# Patient Record
Sex: Female | Born: 1960 | Race: White | Hispanic: No | Marital: Married | Smoking: Never smoker
Health system: Southern US, Community
[De-identification: ages and names within clinical notes are randomized; demographics above are authoritative.]

## PROBLEM LIST (undated history)

## (undated) DIAGNOSIS — M797 Fibromyalgia: Secondary | ICD-10-CM

## (undated) DIAGNOSIS — R51 Headache: Secondary | ICD-10-CM

## (undated) DIAGNOSIS — G939 Disorder of brain, unspecified: Secondary | ICD-10-CM

## (undated) DIAGNOSIS — Z8489 Family history of other specified conditions: Secondary | ICD-10-CM

## (undated) DIAGNOSIS — E039 Hypothyroidism, unspecified: Secondary | ICD-10-CM

## (undated) DIAGNOSIS — E079 Disorder of thyroid, unspecified: Secondary | ICD-10-CM

## (undated) DIAGNOSIS — F32A Depression, unspecified: Secondary | ICD-10-CM

## (undated) DIAGNOSIS — G43909 Migraine, unspecified, not intractable, without status migrainosus: Secondary | ICD-10-CM

## (undated) DIAGNOSIS — M3500 Sicca syndrome, unspecified: Secondary | ICD-10-CM

## (undated) DIAGNOSIS — I7300 Raynaud's syndrome without gangrene: Secondary | ICD-10-CM

## (undated) DIAGNOSIS — M1990 Unspecified osteoarthritis, unspecified site: Secondary | ICD-10-CM

## (undated) DIAGNOSIS — I959 Hypotension, unspecified: Secondary | ICD-10-CM

## (undated) DIAGNOSIS — K5792 Diverticulitis of intestine, part unspecified, without perforation or abscess without bleeding: Secondary | ICD-10-CM

## (undated) DIAGNOSIS — R519 Headache, unspecified: Secondary | ICD-10-CM

## (undated) DIAGNOSIS — F419 Anxiety disorder, unspecified: Secondary | ICD-10-CM

## (undated) DIAGNOSIS — K219 Gastro-esophageal reflux disease without esophagitis: Secondary | ICD-10-CM

## (undated) DIAGNOSIS — R413 Other amnesia: Secondary | ICD-10-CM

## (undated) DIAGNOSIS — N189 Chronic kidney disease, unspecified: Secondary | ICD-10-CM

## (undated) DIAGNOSIS — I729 Aneurysm of unspecified site: Secondary | ICD-10-CM

## (undated) DIAGNOSIS — F329 Major depressive disorder, single episode, unspecified: Secondary | ICD-10-CM

## (undated) DIAGNOSIS — N200 Calculus of kidney: Secondary | ICD-10-CM

## (undated) HISTORY — DX: Hypothyroidism, unspecified: E03.9

## (undated) HISTORY — PX: BREAST BIOPSY: SHX20

## (undated) HISTORY — DX: Gastro-esophageal reflux disease without esophagitis: K21.9

## (undated) HISTORY — PX: EYE SURGERY: SHX253

## (undated) HISTORY — DX: Calculus of kidney: N20.0

## (undated) HISTORY — DX: Other amnesia: R41.3

## (undated) HISTORY — DX: Fibromyalgia: M79.7

## (undated) HISTORY — PX: KNEE SURGERY: SHX244

## (undated) HISTORY — DX: Hypotension, unspecified: I95.9

## (undated) HISTORY — DX: Disorder of brain, unspecified: G93.9

## (undated) HISTORY — DX: Chronic kidney disease, unspecified: N18.9

## (undated) HISTORY — DX: Headache: R51

## (undated) HISTORY — DX: Unspecified osteoarthritis, unspecified site: M19.90

## (undated) HISTORY — DX: Disorder of thyroid, unspecified: E07.9

## (undated) HISTORY — PX: BASAL CELL CARCINOMA EXCISION: SHX1214

## (undated) HISTORY — DX: Headache, unspecified: R51.9

## (undated) HISTORY — DX: Sjogren syndrome, unspecified: M35.00

## (undated) HISTORY — DX: Migraine, unspecified, not intractable, without status migrainosus: G43.909

## (undated) HISTORY — DX: Diverticulitis of intestine, part unspecified, without perforation or abscess without bleeding: K57.92

## (undated) HISTORY — PX: LITHOTRIPSY: SUR834

## (undated) HISTORY — PX: ABDOMINAL HYSTERECTOMY: SHX81

## (undated) HISTORY — PX: OOPHORECTOMY: SHX86

## (undated) HISTORY — DX: Raynaud's syndrome without gangrene: I73.00

## (undated) HISTORY — DX: Aneurysm of unspecified site: I72.9

## (undated) HISTORY — DX: Depression, unspecified: F32.A

## (undated) HISTORY — DX: Major depressive disorder, single episode, unspecified: F32.9

## (undated) HISTORY — DX: Anxiety disorder, unspecified: F41.9

## (undated) HISTORY — PX: MUSCLE BIOPSY: SHX716

---

## 2013-07-13 DIAGNOSIS — N951 Menopausal and female climacteric states: Secondary | ICD-10-CM | POA: Insufficient documentation

## 2013-07-13 DIAGNOSIS — R232 Flushing: Secondary | ICD-10-CM | POA: Insufficient documentation

## 2013-07-13 DIAGNOSIS — M3500 Sicca syndrome, unspecified: Secondary | ICD-10-CM | POA: Insufficient documentation

## 2013-07-13 DIAGNOSIS — N200 Calculus of kidney: Secondary | ICD-10-CM | POA: Insufficient documentation

## 2013-07-13 DIAGNOSIS — G43909 Migraine, unspecified, not intractable, without status migrainosus: Secondary | ICD-10-CM | POA: Insufficient documentation

## 2013-07-13 DIAGNOSIS — C4491 Basal cell carcinoma of skin, unspecified: Secondary | ICD-10-CM | POA: Insufficient documentation

## 2013-07-13 DIAGNOSIS — I950 Idiopathic hypotension: Secondary | ICD-10-CM | POA: Insufficient documentation

## 2013-07-13 DIAGNOSIS — IMO0001 Reserved for inherently not codable concepts without codable children: Secondary | ICD-10-CM | POA: Insufficient documentation

## 2013-07-13 DIAGNOSIS — K2960 Other gastritis without bleeding: Secondary | ICD-10-CM | POA: Insufficient documentation

## 2013-07-13 DIAGNOSIS — E8940 Asymptomatic postprocedural ovarian failure: Secondary | ICD-10-CM | POA: Insufficient documentation

## 2013-08-12 ENCOUNTER — Ambulatory Visit: Payer: Self-pay | Admitting: Neurology

## 2013-09-16 ENCOUNTER — Ambulatory Visit: Payer: Self-pay | Admitting: Family Medicine

## 2013-09-23 DIAGNOSIS — G43019 Migraine without aura, intractable, without status migrainosus: Secondary | ICD-10-CM | POA: Insufficient documentation

## 2014-06-01 ENCOUNTER — Encounter: Payer: Self-pay | Admitting: Neurology

## 2014-06-01 ENCOUNTER — Ambulatory Visit (INDEPENDENT_AMBULATORY_CARE_PROVIDER_SITE_OTHER): Payer: Commercial Indemnity | Admitting: Neurology

## 2014-06-01 ENCOUNTER — Telehealth: Payer: Self-pay | Admitting: Neurology

## 2014-06-01 VITALS — BP 114/73 | HR 68 | Ht 67.0 in | Wt 166.0 lb

## 2014-06-01 DIAGNOSIS — G3184 Mild cognitive impairment, so stated: Secondary | ICD-10-CM | POA: Diagnosis not present

## 2014-06-01 NOTE — Progress Notes (Addendum)
PATIENT: Stacy [name] DOB: 17-Jan-1961  HISTORICAL  Stacy [name] 54 yo RH female referred by her primary care physician Dr. 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  She now lives at home with her husband, noticed difficulty at her daily activity, left the stove unattended, almost flooded her kitchen, still driving,drove herself to office visit today, with the help of GPS  She is interested in clinical trials, of mild cognitive impairment  She came in [facility], [facility], brought impairment across cognitive domains, she had particular trouble with working memory, Firefighter, cognitive flexibility, the pattern indicate primarily subcortical dysfunction, although the cause is not clear, she does not appear to be experienced depression, anxiety, or other psychological disorders, that would have a negative impact on cognition this was done by [facility].  REVIEW OF SYSTEMS: Full 14 system review of systems performed and notable only for easy bruising, joint pain, joint swelling, achy muscles, memory loss, confusion, headaches, fatigue, skin sensitivity, itching ALLERGIES: Allergies  Allergen Reactions  . Amoxicillin Hives  . Penicillins Hives    HOME MEDICATIONS: Current Outpatient Prescriptions  Medication Sig Dispense Refill  . Calcium-Vitamin D 600-200 MG-UNIT  per tablet Take by mouth.    . cevimeline (EVOXAC) 30 MG capsule Take by mouth.    . Cholecalciferol 1000 UNITS tablet Take by mouth.    . Cranberry-Vitamin C-Vitamin E (CRANBERRY/VITAMIN C TRIPLE ST PO) Take by mouth.    . donepezil (ARICEPT) 5 MG tablet Take by mouth.    . escitalopram (LEXAPRO) 10 MG tablet Take by mouth.    . fludrocortisone (FLORINEF) 0.1 MG tablet Take by mouth.    . gabapentin (NEURONTIN) 300 MG capsule Take by mouth.    . hydroxychloroquine (PLAQUENIL) 200 MG tablet Take by mouth.    . levothyroxine (SYNTHROID, LEVOTHROID) 175 MCG tablet Take by mouth.    Marland Kitchen omeprazole (PRILOSEC) 20 MG capsule Take by mouth.    . SUMAtriptan (IMITREX) 100 MG tablet Take by mouth.    . Topiramate ER 200 MG CP24 Take 1 capsule by mouth  daily    . vitamin E 400 UNIT capsule Take 400 Units by mouth daily.     PAST MEDICAL HISTORY: Past Medical History  Diagnosis Date  . Headache   . Arthritis   . GERD (gastroesophageal reflux disease)   . Thyroid disease   . Hypotension   . Sjogren's syndrome   . Raynaud disease   . Brain stem lesion   . Aneurysm   . Memory loss   . Hypothyroid   . Fibromyalgia   . Migraine     PAST SURGICAL HISTORY: Past Surgical History  Procedure Laterality Date    Knee surgery    . Cesarean section    . Eye surgery    . Basal cell carcinoma excision    . Lithotripsy    .  Abdominal hysterectomy    . Muscle biopsy    . Oophorectomy, endometriosis 54 yo, on supplement      FAMILY HISTORY: Family History  Problem Relation Age of Onset  . Migraines Mother   . Cancer Father   . Hypertension Father   . Stroke Father   . Heart attack Father   . Cancer Paternal Aunt   . Lupus Paternal Aunt   . Diabetes Brother   . Diabetes Maternal Grandfather     SOCIAL HISTORY:  History   Social History  . Marital Status: Married    Spouse Name: N/A  . Number of Children: 2  . Years of Education: 14   Occupational History  . Disabled    Social  History Main Topics  . Smoking status: Never Smoker   . Smokeless tobacco: Not on file  . Alcohol Use: No  . Drug Use: No  . Sexual Activity: Not on file   Other Topics Concern  . Not on file   Social History Narrative   Lives at home with husband.   Right handed.   2 cups caffeine/daily     PHYSICAL EXAM   Filed Vitals:   06/01/14 0954  BP: 114/73  Pulse: 68  Height: 5\' 7"  (1.702 m)  Weight: 166 lb (75.297 kg)    Not recorded      Body mass index is 25.99 kg/(m^2).  PHYSICAL EXAMNIATION:  Gen: NAD, conversant, well nourised, obese, well groomed                     Cardiovascular: Regular rate rhythm, no peripheral edema, warm, nontender. Eyes: Conjunctivae clear without exudates or hemorrhage Neck: Supple, no carotid bruise. Pulmonary: Clear to auscultation bilaterally   NEUROLOGICAL EXAM:  MENTAL STATUS: Speech:    Speech is normal; fluent and spontaneous with normal comprehension.  Cognition:    Mini-Mental status is 26 out of 30, she is not oriented to date, doctor's office, Missed 2 out of 3 recalls  CRANIAL NERVES: CN II: Visual fields are full to confrontation. Fundoscopic exam is normal with sharp discs and no vascular changes. Venous pulsations are present bilaterally. Pupils are 4 mm and briskly reactive to light. Visual acuity is 20/20 bilaterally. CN III, IV, VI: extraocular movement are normal. No ptosis. CN V: Facial sensation is intact to pinprick in all 3 divisions bilaterally. Corneal responses are intact.  CN VII: Face is symmetric with normal eye closure and smile. CN VIII: Hearing is normal to rubbing fingers CN IX, X: Palate elevates symmetrically. Phonation is normal. CN XI: Head turning and shoulder shrug are intact CN XII: Tongue is midline with normal movements and no atrophy.  MOTOR: There is no pronator drift of out-stretched arms. Muscle bulk and tone are normal. Muscle strength is normal.   Shoulder abduction Shoulder external  rotation Elbow flexion Elbow extension Wrist flexion Wrist extension Finger abduction Hip flexion Knee flexion Knee extension Ankle dorsi flexion Ankle plantar flexion  R 5 5 5 5 5 5 5 5 5 5 5 5   L 5 5 5 5 5 5 5 5 5 5 5 5     REFLEXES: Reflexes are 2+ and symmetric at the biceps, triceps, knees, and ankles. Plantar responses are flexor.  SENSORY: Light touch, pinprick, position sense, and vibration sense are intact in fingers and toes.  COORDINATION: Rapid alternating movements and fine finger movements are intact. There is no dysmetria on finger-to-nose and heel-knee-shin. There are no abnormal or  extraneous movements.   GAIT/STANCE: Posture is normal. Gait is steady with normal steps, base, arm swing, and turning. Heel and toe walking are normal. Tandem gait is normal.  Romberg is absent.   DIAGNOSTIC DATA (LABS, IMAGING, TESTING) - I reviewed patient records, labs, notes, testing and imaging myself where available.  ASSESSMENT AND PLAN  Noreta Kue is a 54 y.o. female  Presenting with gradual onset short-term memory trouble, Mini-Mental Status Examination is 26 out of 30  1. Mild cognitive impairment, will try to get previous record 2. Return to clinic in 1 months, I will go over the cognitive trial Information with her  Return in about 1 month (around 07/01/2014).    Marcial Pacas, M.D. Ph.D.  [facility] [address], [place], [place] [address] Ph: [phone] Fax: [phone]

## 2014-06-01 NOTE — Telephone Encounter (Signed)
I requested her records today from [place] - let me know if I need to do anything else.

## 2014-06-01 NOTE — Telephone Encounter (Signed)
I need to call her about finding out research protocol

## 2014-06-03 DIAGNOSIS — F0390 Unspecified dementia without behavioral disturbance: Secondary | ICD-10-CM | POA: Insufficient documentation

## 2014-07-01 ENCOUNTER — Ambulatory Visit: Payer: Commercial Indemnity | Admitting: Neurology

## 2014-07-14 ENCOUNTER — Encounter: Payer: Self-pay | Admitting: Neurology

## 2014-07-14 ENCOUNTER — Ambulatory Visit (INDEPENDENT_AMBULATORY_CARE_PROVIDER_SITE_OTHER): Payer: Commercial Indemnity | Admitting: Neurology

## 2014-07-14 VITALS — BP 127/78 | HR 70 | Ht 67.0 in | Wt 166.0 lb

## 2014-07-14 DIAGNOSIS — G3184 Mild cognitive impairment, so stated: Secondary | ICD-10-CM

## 2014-07-14 MED ORDER — MEMANTINE HCL 10 MG PO TABS: 10.0000 mg | ORAL_TABLET | Freq: Two times a day (BID) | ORAL | Status: DC

## 2014-07-14 NOTE — Progress Notes (Signed)
Chief Complaint  Patient presents with  . Mild Cognitive Impairment    Says her memory is unchanged.  She completed a MMSE on 06/01/14 and scored 26/30 and named 8 animals.  She would like to review her MRI today.      PATIENT: Stacy [name] DOB: 1960-06-26  HISTORICAL  Stacy [name] 54 yo RH female referred by her primary care physician Dr. 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  She now lives at home with her husband, noticed difficulty at her daily activity, left the stove unattended, almost flooded her kitchen, still driving,drove herself to office visit today, with the help of GPS  She is interested in clinical trials, of mild cognitive impairment  She came in with [facility] evaluation, [facility], brought impairment across cognitive domains, she had particular trouble with working memory, Firefighter, cognitive flexibility, the pattern indicate primarily subcortical dysfunction, although the cause is not clear, she does not appear to be experienced depression, anxiety, or other psychological disorders, that would have a negative impact on cognition this was done by [facility]   Update Jul 14 2014: I have reviewed previous MRI report, from 2006-2012, and personally reviewed most recent MRI of the brain with without contrast in August 2012, showed a stable tectal plate glioma, 5 M0N0  mm, no evidence of hydrocephalus,  MRI of lumbar 2010, mild degenerative disc disease, no significant foraminal or canal stenosis MRI of thoracic spine, no significant intramedullary abnormality MRI of cervical spine, questionable cord signal changes, I will repeat MRI there was no significant abnormality found  Patient continued to have stable mild memory loss, able to drive to office today by GPS,  REVIEW OF SYSTEMS: Full 14 system review of systems performed and notable only for easy bruising, joint pain, joint swelling, achy muscles, memory loss, confusion, headaches, fatigue, skin sensitivity, itching ALLERGIES: Allergies  Allergen Reactions  . Amoxicillin Hives  . Penicillins Hives    HOME MEDICATIONS: Current Outpatient Prescriptions  Medication Sig Dispense Refill  . Calcium-Vitamin D 600-200 MG-UNIT per tablet Take by mouth.    . cevimeline (EVOXAC) 30 MG capsule Take by mouth.    . Cholecalciferol 1000 UNITS tablet Take by mouth.    . Cranberry-Vitamin C-Vitamin E (CRANBERRY/VITAMIN C TRIPLE ST PO) Take by mouth.    . donepezil (ARICEPT) 5 MG tablet Take by mouth.    . escitalopram (LEXAPRO) 10 MG tablet Take by mouth.    . fludrocortisone (FLORINEF) 0.1 MG tablet Take by mouth.    . gabapentin (NEURONTIN) 300 MG capsule Take by mouth.    . hydroxychloroquine (PLAQUENIL) 200 MG tablet Take by mouth.    . levothyroxine (SYNTHROID, LEVOTHROID) 175 MCG tablet Take by mouth.    Marland Kitchen omeprazole (PRILOSEC) 20 MG capsule Take by mouth.    . SUMAtriptan (IMITREX) 100 MG tablet Take by mouth.    . Topiramate ER  200 MG CP24 Take 1 capsule by mouth  daily    . vitamin E 400 UNIT capsule Take 400 Units by mouth daily.     PAST MEDICAL HISTORY: Past Medical History  Diagnosis Date  . Headache   . Arthritis   . GERD (gastroesophageal reflux disease)   . Thyroid disease   . Hypotension   . Sjogren's syndrome   . Raynaud disease   . Brain stem lesion   . Aneurysm   . Memory loss    . Hypothyroid   . Fibromyalgia   . Migraine     PAST SURGICAL HISTORY: Past Surgical History  Procedure Laterality Date    Knee surgery    . Cesarean section    . Eye surgery    . Basal cell carcinoma excision    . Lithotripsy    . Abdominal hysterectomy    . Muscle biopsy    . Oophorectomy, endometriosis 54 yo, on supplement      FAMILY HISTORY: Family History  Problem Relation Age of Onset  . Migraines Mother   . Cancer Father   . Hypertension Father   . Stroke Father   . Heart attack Father   . Cancer Paternal Aunt   . Lupus Paternal Aunt   . Diabetes Brother   . Diabetes Maternal Grandfather     SOCIAL HISTORY:  History   Social History  . Marital Status: Married    Spouse Name: N/A  . Number of Children: 2  . Years of Education: 14   Occupational History  . Disabled    Social History Main Topics  . Smoking status: Never Smoker   . Smokeless tobacco: Not on file  . Alcohol Use: No  . Drug Use: No  . Sexual Activity: Not on file   Other Topics Concern  . Not on file   Social History Narrative   Lives at home with husband.   Right handed.   2 cups caffeine/daily     PHYSICAL EXAM   Filed Vitals:   07/14/14 1448  BP: 127/78  Pulse: 70  Height: 5\' 7"  (1.702 m)  Weight: 166 lb (75.297 kg)    Not recorded      Body mass index is 25.99 kg/(m^2).  PHYSICAL EXAMNIATION:  Gen: NAD, conversant, well nourised, obese, well groomed                     Cardiovascular: Regular rate rhythm, no peripheral edema, warm, nontender. Eyes: Conjunctivae clear without exudates or hemorrhage Neck: Supple, no carotid bruise. Pulmonary: Clear to auscultation bilaterally   NEUROLOGICAL EXAM:  MENTAL STATUS: Speech:    Speech is normal; fluent and spontaneous with normal comprehension.  Cognition:    She is alert, cooperative on examinations,  CRANIAL NERVES: CN II: Visual fields are full to confrontation. Fundoscopic exam is normal with sharp  discs and no vascular changes. Venous pulsations are present bilaterally. Pupils are 4 mm and briskly reactive to light. Visual acuity is 20/20 bilaterally. CN III, IV, VI: extraocular movement are normal. No ptosis. CN V: Facial sensation is intact to pinprick in all 3 divisions bilaterally. Corneal responses are intact.  CN VII: Face is symmetric with normal eye closure and smile. CN VIII: Hearing is normal to rubbing fingers CN IX, X: Palate elevates symmetrically. Phonation is normal. CN XI: Head turning and shoulder shrug are intact CN XII: Tongue is midline with normal movements and no atrophy.  MOTOR: There is no pronator  drift of out-stretched arms. Muscle bulk and tone are normal. Muscle strength is normal.  REFLEXES: Reflexes are 2+ and symmetric at the biceps, triceps, knees, and ankles. Plantar responses are flexor.  SENSORY: Light touch, pinprick, position sense, and vibration sense are intact in fingers and toes.  COORDINATION: Rapid alternating movements and fine finger movements are intact. There is no dysmetria on finger-to-nose and heel-knee-shin. There are no abnormal or extraneous movements.   GAIT/STANCE: Posture is normal. Gait is steady with normal steps, base, arm swing, and turning. Heel and toe walking are normal. Tandem gait is normal.  Romberg is absent.   DIAGNOSTIC DATA (LABS, IMAGING, TESTING) - I reviewed patient records, labs, notes, testing and imaging myself where available.  ASSESSMENT AND PLAN  Stacy [name] is a 54 y.o. female  Presenting with gradual onset short-term memory trouble, Mini-Mental Status Examination was 26 out of 30  1. Mild cognitive impairment, which is conformed by neuropsychology evaluation 2. 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 Return to clinic in 3 months     Marcial Pacas, M.D. Ph.D.  [facility] [address], [place], [place] [address] Ph: [phone] Fax: [phone]

## 2014-07-21 ENCOUNTER — Other Ambulatory Visit: Payer: Commercial Indemnity

## 2014-07-27 ENCOUNTER — Ambulatory Visit (INDEPENDENT_AMBULATORY_CARE_PROVIDER_SITE_OTHER): Payer: Commercial Indemnity

## 2014-07-27 DIAGNOSIS — G3184 Mild cognitive impairment, so stated: Secondary | ICD-10-CM

## 2014-07-28 ENCOUNTER — Telehealth: Payer: Self-pay | Admitting: Neurology

## 2014-07-28 MED ORDER — GADOPENTETATE DIMEGLUMINE 469.01 MG/ML IV SOLN
15.0000 mL | Freq: Once | INTRAVENOUS | Status: AC | PRN
Start: 2014-07-28 — End: 2014-07-28

## 2014-07-28 NOTE — Telephone Encounter (Signed)
Patient aware of MRI results  

## 2014-07-28 NOTE — Telephone Encounter (Signed)
Please call patient, MRI brain showed no significant changes,    Abnormal MRI brain (with and without) demonstrating: 1. There is a 5x5x59mm midline tectal lesion ([id number] hyperintense). No abnormal enhancement. 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 No change from MRI on 08/12/13. 2. No acute findings

## 2014-09-01 ENCOUNTER — Other Ambulatory Visit: Payer: Self-pay | Admitting: Family Medicine

## 2014-09-01 NOTE — Telephone Encounter (Signed)
Received a fax from [facility] requesting a refill of this patient's Lexapro. Refill request was sent to Dr. Bobetta Lime for approval and submission.

## 2014-09-05 MED ORDER — ESCITALOPRAM OXALATE 10 MG PO TABS: 10.0000 mg | ORAL_TABLET | Freq: Every day | ORAL | Status: DC

## 2014-10-07 ENCOUNTER — Ambulatory Visit (INDEPENDENT_AMBULATORY_CARE_PROVIDER_SITE_OTHER): Payer: Managed Care, Other (non HMO) | Admitting: Family Medicine

## 2014-10-07 ENCOUNTER — Encounter: Payer: Self-pay | Admitting: Family Medicine

## 2014-10-07 VITALS — BP 108/70 | HR 74 | Temp 98.0°F | Resp 16 | Wt 165.7 lb

## 2014-10-07 DIAGNOSIS — F3341 Major depressive disorder, recurrent, in partial remission: Secondary | ICD-10-CM | POA: Insufficient documentation

## 2014-10-07 DIAGNOSIS — G3184 Mild cognitive impairment, so stated: Secondary | ICD-10-CM | POA: Diagnosis not present

## 2014-10-07 DIAGNOSIS — M797 Fibromyalgia: Secondary | ICD-10-CM | POA: Diagnosis not present

## 2014-10-07 DIAGNOSIS — E039 Hypothyroidism, unspecified: Secondary | ICD-10-CM | POA: Insufficient documentation

## 2014-10-07 DIAGNOSIS — E663 Overweight: Secondary | ICD-10-CM

## 2014-10-07 DIAGNOSIS — E038 Other specified hypothyroidism: Secondary | ICD-10-CM | POA: Diagnosis not present

## 2014-10-07 DIAGNOSIS — F418 Other specified anxiety disorders: Secondary | ICD-10-CM | POA: Diagnosis not present

## 2014-10-07 DIAGNOSIS — Z87442 Personal history of urinary calculi: Secondary | ICD-10-CM | POA: Insufficient documentation

## 2014-10-07 DIAGNOSIS — E034 Atrophy of thyroid (acquired): Secondary | ICD-10-CM | POA: Diagnosis not present

## 2014-10-07 MED ORDER — CLONAZEPAM 1 MG PO TABS: 1.0000 mg | ORAL_TABLET | Freq: Two times a day (BID) | ORAL | Status: DC | PRN

## 2014-10-07 NOTE — Progress Notes (Signed)
Name: Stacy [name]   MRN: [id number]    DOB: March 22, 1960   Date:10/07/2014       Progress Note  Subjective  Chief Complaint  Chief Complaint  Patient presents with  . Anxiety    patient is not sure if it is anxiety or if it is a reaction to her medication (Memantine HCL & Aricept)    HPI  Stacy [name]. Wolters is a 54 year old female who established care with me on 06/01/2013. The patient established care with me as her primary care physician after moving to [facility] from [place]. She required medication refills for her chronic medical issues previously diagnosed by her other physicians, routine lab work, a physical exam and overall ongoing management of her medical needs.  Prior to establishing care with me, Stacy [name] was already diagnosed with Idiopathic Hypotention, Hypothyroidism, Fibromyalgia, GERD, Chronic Fatigue Syndrome, Basal Cell Carcinoma of Skin, Sjogren's Syndrome, Surgical Menopausal Symptoms, Migraine Headaches, Chronic Kidney Disease Stage II. I have more recently diagnosed her with Memory Impairment which is being managed by a Neurology Specialist along with her Migraine Headaches.  Her Sjogren's Syndrome is managed by a Rheumatology Specialist.  Today she reports having symptoms of anxiousness and restless feeling combined with crying spells and overwhelming feelings mostly in relation to her medial issues and frustration of memory challenges. She continues to use Lexapro 10mg  a day but has recently tried her husband's clonazepam 1mg  once in a while and notes that it helps.   Patient Active Problem List   Diagnosis Date Noted  . Mild cognitive impairment 06/03/2014  . Common migraine with intractable migraine 09/23/2013  . Basal cell carcinoma 07/13/2013  . Hot flash, menopausal 07/13/2013  . 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 Calculus of kidney 07/13/2013  . Headache, migraine 07/13/2013  . 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 Gougerout-Sjoegren syndrome 07/13/2013   . Postsurgical menopause 07/13/2013    Social History  Substance Use Topics  . Smoking status: Never Smoker   . Smokeless tobacco: Not on file  . Alcohol Use: No     Current outpatient prescriptions:  .  Calcium-Vitamin D 600-200 MG-UNIT per tablet, Take by mouth., Disp: , Rfl:  .  cevimeline (EVOXAC) 30 MG capsule, Take by mouth., Disp: , Rfl:  .  Cholecalciferol 1000 UNITS tablet, Take by mouth., Disp: , Rfl:  .  Cranberry-Vitamin C-Vitamin E (CRANBERRY/VITAMIN C TRIPLE ST PO), Take by mouth., Disp: , Rfl:  .  donepezil (ARICEPT) 5 MG tablet, Take by mouth., Disp: , Rfl:  .  escitalopram (LEXAPRO) 10 MG tablet, Take 1 tablet (10 mg total) by mouth daily., Disp: 90 tablet, Rfl: 1 .  fludrocortisone (FLORINEF) 0.1 MG tablet, Take by mouth., Disp: , Rfl:  .  gabapentin (NEURONTIN) 300 MG capsule, Take by mouth., Disp: , Rfl:  .  hydroxychloroquine (PLAQUENIL) 200 MG tablet, Take by mouth., Disp: , Rfl:  .  levothyroxine (SYNTHROID, LEVOTHROID) 175 MCG tablet, Take 1 tablet by mouth  daily, Disp: 90 tablet, Rfl: 1 .  memantine (NAMENDA) 10 MG tablet, Take 1 tablet (10 mg total) by mouth 2 (two) times daily., Disp: 60 tablet, Rfl: 11 .  omeprazole (PRILOSEC) 20 MG capsule, Take by mouth., Disp: , Rfl:  .  SUMAtriptan (IMITREX) 100 MG tablet, Take by mouth., Disp: , Rfl:  .  Topiramate ER 200 MG CP24, Take 1 capsule by mouth  daily, Disp: , Rfl:  .  vitamin E 400 UNIT capsule, Take 400 Units by mouth daily.,  Disp: , Rfl:   Past Surgical History  Procedure Laterality Date  . Replacement total knee    . Cesarean section    . Eye surgery    . Basal cell carcinoma excision    . Lithotripsy    . Abdominal hysterectomy    . Muscle biopsy    . Oophorectomy      Family History  Problem Relation Age of Onset  . Migraines Mother   . Cancer Father   . Hypertension Father   . Stroke Father   . Heart attack Father   . Cancer Paternal Aunt   . Lupus Paternal Aunt   . Diabetes Brother    . Diabetes Maternal Grandfather     Allergies  Allergen Reactions  . Amoxicillin Hives  . Penicillins Hives     Review of Systems  CONSTITUTIONAL: No significant weight changes, fever, chills, weakness. Chronically fatigued.  HEENT:  - Eyes: No visual changes.  - Ears: No auditory changes. No pain.  - Nose: No sneezing, congestion, runny nose. - Throat: No sore throat. No changes in swallowing. SKIN: No rash or itching.  CARDIOVASCULAR: No chest pain, chest pressure or chest discomfort. No palpitations or edema.  RESPIRATORY: No shortness of breath, cough or sputum.  GASTROINTESTINAL: No anorexia, nausea, vomiting. No changes in bowel habits. No abdominal pain or blood.  GENITOURINARY: No dysuria. No frequency. No discharge.  NEUROLOGICAL: No headache, dizziness, syncope, paralysis, ataxia, numbness or tingling in the extremities. No memory changes. No change in bowel or bladder control.  MUSCULOSKELETAL: No joint pain. No muscle pain. HEMATOLOGIC: No anemia, bleeding or bruising.  LYMPHATICS: No enlarged lymph nodes.  PSYCHIATRIC: Yes change in mood. No change in sleep pattern.  ENDOCRINOLOGIC: No reports of sweating, cold or heat intolerance. No polyuria or polydipsia.   Depression screen [facility] 2/9 10/07/2014  Decreased Interest 2  Down, Depressed, Hopeless 2  PHQ - 2 Score 4  Altered sleeping 2  Tired, decreased energy 0  Change in appetite 0  Feeling bad or failure about yourself  0  Trouble concentrating 2  Moving slowly or fidgety/restless 0  Suicidal thoughts 0  PHQ-9 Score 8      Objective  BP 108/70 mmHg  Pulse 74  Temp(Src) 98 F (36.7 C) (Oral)  Resp 16  Wt 165 lb 11.2 oz (75.161 kg)  SpO2 97% Body mass index is 25.95 kg/(m^2).  Physical Exam  Constitutional: Patient appears well-developed and well-nourished. In no distress. Soft spoken and always appears sleepy or tired. HEENT:  - Head: Normocephalic and atraumatic.  - Ears: Bilateral TMs  gray, no erythema or effusion - Nose: Nasal mucosa moist - Mouth/Throat: Oropharynx is clear and moist. No tonsillar hypertrophy or erythema. No post nasal drainage.  - Eyes: Conjunctivae clear, EOM movements normal. PERRLA. No scleral icterus.  Neck: Normal range of motion. Neck supple. No JVD present. No thyromegaly present.  Cardiovascular: Normal rate, regular rhythm and normal heart sounds.  No murmur heard.  Pulmonary/Chest: Effort normal and breath sounds normal. No respiratory distress. Musculoskeletal: Normal range of motion bilateral UE and LE, no joint effusions. Peripheral vascular: Bilateral LE no edema. Neurological: CN II-XII grossly intact with no focal deficits. Alert and oriented to person, place, and time. Coordination, balance, strength, speech and gait are normal.  Skin: Skin is warm and dry. No rash noted. No erythema.  Psychiatric: Patient has a stable normal mood and affect. Behavior is normal in office today. Judgment and thought content normal in  office today.    Assessment & Plan  1. Fibromyalgia Stable clinical picture.   2. Mild cognitive impairment Started on Aricept and Namenda several months ago. Although Namenda can contributing to anxiety I do not feel that it would be in her benefit to reduce or discontinue this medications. The benefits outweigh any potential risks or side effects at this time.  She is agreeable to continuing these medications  3. Depression with anxiety We discussed increasing Lexapro vs adding on clonazepam and she has decided on using Clonazepam sparingly. If symptoms progress despite this I have advised her that we should increase Lexapro or consider adding on Wellbutrin as this may help with fibromyalgia and fatigue issues as well.  - clonazePAM (KLONOPIN) 1 MG tablet; Take 1 tablet (1 mg total) by mouth 2 (two) times daily as needed for anxiety.  Dispense: 90 tablet; Refill: 1  4. Hypothyroidism due to acquired atrophy of  thyroid Due to repeat blood work and make sure thyroid disorder is not contributing to changes with mood disorder.  - TSH - T3, free - T4, free  5. Overweight (BMI 25.0-29.9) The patient has been counseled on their higher than normal BMI.  They have verbally expressed understanding their increased risk for other diseases.  In efforts to meet a better target BMI goal the patient has been counseled on lifestyle, diet and exercise modification tactics. Start with moderate intensity aerobic exercise (walking, jogging, elliptical, swimming, group or individual sports, hiking) at least 52mins a day at least 4 days a week and increase intensity, duration, frequency as tolerated. Diet should include well balance fresh fruits and vegetables avoiding processed foods, carbohydrates and sugars. Drink at least 8oz 10 glasses a day avoiding sodas, sugary fruit drinks, sweetened tea. Check weight on a reliable scale daily and monitor weight loss progress daily. Consider investing in mobile phone apps that will help keep track of weight loss goals.

## 2014-10-22 ENCOUNTER — Other Ambulatory Visit: Payer: Self-pay | Admitting: Family Medicine

## 2014-10-22 DIAGNOSIS — K219 Gastro-esophageal reflux disease without esophagitis: Secondary | ICD-10-CM

## 2014-10-23 ENCOUNTER — Other Ambulatory Visit: Payer: Self-pay | Admitting: Family Medicine

## 2014-10-25 ENCOUNTER — Telehealth: Payer: Self-pay | Admitting: Family Medicine

## 2014-10-25 ENCOUNTER — Telehealth: Payer: Self-pay | Admitting: *Deleted

## 2014-10-25 ENCOUNTER — Encounter: Payer: Self-pay | Admitting: Adult Health

## 2014-10-25 ENCOUNTER — Ambulatory Visit (INDEPENDENT_AMBULATORY_CARE_PROVIDER_SITE_OTHER): Payer: Commercial Indemnity | Admitting: Adult Health

## 2014-10-25 ENCOUNTER — Other Ambulatory Visit: Payer: Self-pay | Admitting: Family Medicine

## 2014-10-25 VITALS — BP 110/72 | HR 75 | Ht 67.0 in | Wt 162.0 lb

## 2014-10-25 DIAGNOSIS — R51 Headache: Secondary | ICD-10-CM | POA: Diagnosis not present

## 2014-10-25 DIAGNOSIS — R519 Headache, unspecified: Secondary | ICD-10-CM

## 2014-10-25 DIAGNOSIS — K219 Gastro-esophageal reflux disease without esophagitis: Secondary | ICD-10-CM

## 2014-10-25 DIAGNOSIS — G3184 Mild cognitive impairment, so stated: Secondary | ICD-10-CM

## 2014-10-25 MED ORDER — KETOROLAC TROMETHAMINE 60 MG/2ML IM SOLN: 60.0000 mg | Freq: Once | INTRAMUSCULAR | Status: AC

## 2014-10-25 MED ORDER — OMEPRAZOLE 20 MG PO CPDR: 20.0000 mg | DELAYED_RELEASE_CAPSULE | Freq: Every day | ORAL | Status: DC

## 2014-10-25 NOTE — Addendum Note (Signed)
Addended by: Noberto Retort C on: 10/25/2014 10:52 AM   Modules accepted: Orders
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
## 2014-10-25 NOTE — Telephone Encounter (Signed)
Contacted this patient so that I can discuss the message below and see what she will agree to. This patient stated, "whatever she thinks she has to do to get it approved then go ahead." I then informed her that we will try one of Dr. Allie Dimmer recommendations and see what happens otherwise we will call her for an appt to come in, and she said ok, thanks.

## 2014-10-25 NOTE — Progress Notes (Signed)
I have reviewed and agreed above plan. 

## 2014-10-25 NOTE — Progress Notes (Signed)
I have read the note, and I agree with the clinical assessment and plan.  WILLIS,CHARLES KEITH   

## 2014-10-25 NOTE — Patient Instructions (Signed)
Continue Aricept and Namenda  Memory score is stable If your symptoms worsen or you develop new symptoms please let us know.   

## 2014-10-25 NOTE — Telephone Encounter (Signed)
Please convey to Stacy [name] that her insurance company has changed their policy regarding her chronic medication Omeprazole 20 mg twice a day.  I have received a notice stating that they will no longer cover twice a day quantity and they are also requesting documentation of previous medications used as well as reasons for why she is needing this medication long term.  I am proposing that we change her medication to Omeprazole 20MG  or 40mg  ONE A DAY to see if her insurance company will approve this.  If this fails she may have to follow up with me so I can fill out the forms with the information they are requesting.

## 2014-10-25 NOTE — Telephone Encounter (Signed)
Encounter opened in error - wrong patient chart.

## 2014-10-25 NOTE — Telephone Encounter (Signed)
Re-sent in Rx for Omeprazole 20 mg but as ONCE A DAY dosing instead of TWICE A DAY dosing.

## 2014-10-25 NOTE — Telephone Encounter (Signed)
Patient given ketorolac 60mg  IM in office at 10:45am.  Call to check on patient in two hours.

## 2014-10-27 ENCOUNTER — Ambulatory Visit: Payer: Commercial Indemnity | Admitting: Neurology

## 2014-11-14 ENCOUNTER — Encounter: Payer: Managed Care, Other (non HMO) | Admitting: Family Medicine

## 2014-12-19 ENCOUNTER — Other Ambulatory Visit: Payer: Self-pay | Admitting: Family Medicine

## 2015-02-06 ENCOUNTER — Ambulatory Visit: Payer: Managed Care, Other (non HMO) | Admitting: Family Medicine

## 2015-02-08 ENCOUNTER — Ambulatory Visit (INDEPENDENT_AMBULATORY_CARE_PROVIDER_SITE_OTHER): Payer: Managed Care, Other (non HMO) | Admitting: Family Medicine

## 2015-02-08 ENCOUNTER — Encounter: Payer: Self-pay | Admitting: Family Medicine

## 2015-02-08 VITALS — BP 112/72 | HR 85 | Temp 97.7°F | Resp 16 | Wt 162.3 lb

## 2015-02-08 DIAGNOSIS — M797 Fibromyalgia: Secondary | ICD-10-CM

## 2015-02-08 DIAGNOSIS — R1032 Left lower quadrant pain: Secondary | ICD-10-CM | POA: Diagnosis not present

## 2015-02-08 DIAGNOSIS — F418 Other specified anxiety disorders: Principal | ICD-10-CM

## 2015-02-08 DIAGNOSIS — F3341 Major depressive disorder, recurrent, in partial remission: Secondary | ICD-10-CM

## 2015-02-08 DIAGNOSIS — K5732 Diverticulitis of large intestine without perforation or abscess without bleeding: Secondary | ICD-10-CM | POA: Diagnosis not present

## 2015-02-08 DIAGNOSIS — E039 Hypothyroidism, unspecified: Secondary | ICD-10-CM | POA: Diagnosis not present

## 2015-02-08 MED ORDER — TRAMADOL HCL 50 MG PO TABS: 50.0000 mg | ORAL_TABLET | Freq: Two times a day (BID) | ORAL | Status: DC | PRN

## 2015-02-08 MED ORDER — MOXIFLOXACIN HCL 400 MG PO TABS: 400.0000 mg | ORAL_TABLET | Freq: Every day | ORAL | Status: DC

## 2015-02-08 NOTE — Progress Notes (Signed)
Name: Stacy [name]   MRN: [id number]    DOB: [birth year]   Date:02/08/2015       Progress Note  Subjective  Chief Complaint  Chief Complaint  Patient presents with  . Back Pain    lower back  . Hip Pain    bilateral  . Diverticulitis    patient thinks she might be having a flare up    HPI  Stacy [name] is a 54 year old female who established care with me on 06/01/2013. The patient established care with me as her primary care physician after moving to [facility] from [place]. Prior to establishing care with me, Stacy [name] was already diagnosed with Idiopathic Hypotention, Hypothyroidism, Fibromyalgia, GERD, Chronic Fatigue Syndrome, Basal Cell Carcinoma of Skin, Sjogren's Syndrome, Surgical Menopausal Symptoms, Migraine Headaches, Chronic Kidney Disease Stage II. I have more recently diagnosed her with Memory Impairment which is being managed by a Neurology Specialist along with her Migraine Headaches. Her Sjogren's Syndrome is managed by a Rheumatology Specialist.   At our last visit in August she complained of anxiety and wanted to be placed on her husband's medication that she tried, clonazepam. She was due to have her thyroid panel checked, ordered labs, never done.   Today she reports having back pain as she has run out of her tramadol. Helps her sleep.   Also having LLQ pain and has had diverticulitis in the past. No nausea, vomiting, fevers. Yes loose stools. No blood in stools.   Past Medical History  Diagnosis Date  . Headache   . Arthritis   . GERD (gastroesophageal reflux disease)   . Thyroid disease   . Hypotension   . Sjogren's syndrome ([place])   . Raynaud disease   . Brain stem lesion   . Aneurysm ([place])   . Memory loss   . Hypothyroid   . Fibromyalgia   . Migraine     Patient Active Problem List   Diagnosis Date Noted  . Left lower quadrant pain 02/08/2015  . Diverticulitis of large intestine without perforation or abscess without bleeding 02/08/2015   . Fibromyalgia 10/07/2014  . Hypothyroidism 10/07/2014  . History of kidney stones 10/07/2014  . Major depressive disorder, recurrent episode, in partial remission with anxious distress ([place]) 10/07/2014  . Overweight (BMI 25.0-29.9) 10/07/2014  . Mild cognitive impairment 06/03/2014  . Common migraine with intractable migraine 09/23/2013  . Basal cell carcinoma 07/13/2013  . Hot flash, menopausal 07/13/2013  . 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 Calculus of kidney 07/13/2013  . Headache, migraine 07/13/2013  . 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 Blush 07/13/2013  . Sjogren's syndrome ([place]) 07/13/2013    Social History  Substance Use Topics  . Smoking status: Never Smoker   . Smokeless tobacco: Never Used  . Alcohol Use: No     Current outpatient prescriptions:  .  Calcium-Vitamin D 600-200 MG-UNIT per tablet, Take by mouth., Disp: , Rfl:  .  Cholecalciferol 1000 UNITS tablet, Take by mouth., Disp: , Rfl:  .  clonazePAM (KLONOPIN) 1 MG tablet, Take 1 tablet (1 mg total) by mouth 2 (two) times daily as needed for anxiety., Disp: 90 tablet, Rfl: 1 .  Cranberry-Vitamin C-Vitamin E (CRANBERRY/VITAMIN C TRIPLE ST PO), Take by mouth., Disp: , Rfl:  .  donepezil (ARICEPT) 5 MG tablet, Take by mouth., Disp: , Rfl:  .  escitalopram (LEXAPRO) 10 MG tablet, Take 1 tablet by mouth  daily,  Disp: 90 tablet, Rfl: 3 .  fludrocortisone (FLORINEF) 0.1 MG tablet, Take 2 tablets by mouth two times daily, Disp: 360 tablet, Rfl: 2 .  gabapentin (NEURONTIN) 300 MG capsule, Take 1 capsule by mouth two times daily, Disp: 180 capsule, Rfl: 2 .  levothyroxine (SYNTHROID, LEVOTHROID) 175 MCG tablet, Take 1 tablet by mouth  daily, Disp: 90 tablet, Rfl: 1 .  memantine (NAMENDA) 10 MG tablet, Take 1 tablet (10 mg total) by mouth 2 (two) times daily., Disp: 60 tablet, Rfl: 11 .  moxifloxacin (AVELOX) 400 MG tablet, Take  1 tablet (400 mg total) by mouth daily at 8 pm., Disp: 10 tablet, Rfl: 0 .  omeprazole (PRILOSEC) 20 MG capsule, Take 1 capsule (20 mg total) by mouth daily., Disp: 180 capsule, Rfl: 3 .  SUMAtriptan (IMITREX) 100 MG tablet, Take by mouth., Disp: , Rfl:  .  Topiramate ER 200 MG CP24, Take 1 capsule by mouth  daily, Disp: , Rfl:  .  traMADol (ULTRAM) 50 MG tablet, Take 1 tablet (50 mg total) by mouth every 12 (twelve) hours as needed for moderate pain or severe pain., Disp: 100 tablet, Rfl: 0 .  vitamin E 400 UNIT capsule, Take 400 Units by mouth daily., Disp: , Rfl:   Past Surgical History  Procedure Laterality Date  . Replacement total knee    . Cesarean section    . Eye surgery    . Basal cell carcinoma excision    . Lithotripsy    . Abdominal hysterectomy    . Muscle biopsy    . Oophorectomy      Family History  Problem Relation Age of Onset  . Migraines Mother   . Cancer Father   . Hypertension Father   . Stroke Father   . Heart attack Father   . Cancer Paternal Aunt   . Lupus Paternal Aunt   . Diabetes Brother   . Diabetes Maternal Grandfather     Allergies  Allergen Reactions  . Amoxicillin Hives  . Penicillins Hives     Review of Systems  CONSTITUTIONAL: No significant weight changes, fever, chills, weakness. Chronically fatigued.  CARDIOVASCULAR: No chest pain, chest pressure or chest discomfort. No palpitations or edema.  RESPIRATORY: No shortness of breath, cough or sputum.  GASTROINTESTINAL: No anorexia, nausea, vomiting. No changes in bowel habits. Yes abdominal pain without bloody stools.  NEUROLOGICAL: No headache, dizziness, syncope, paralysis, ataxia, numbness or tingling in the extremities. Stable chronic memory changes. No change in bowel or bladder control.  MUSCULOSKELETAL: Back and hip joint pain.  PSYCHIATRIC: No change in mood. No change in sleep pattern.  ENDOCRINOLOGIC: No reports of sweating, cold or heat intolerance. No polyuria or  polydipsia.     Objective  BP 112/72 mmHg  Pulse 85  Temp(Src) 97.7 F (36.5 C) (Oral)  Resp 16  Wt 162 lb 4.8 oz (73.619 kg)  SpO2 96% Body mass index is 25.41 kg/(m^2).  Physical Exam  Constitutional: Patient appears well-developed and well-nourished. In no distress.  Neck: Normal range of motion. Neck supple. No JVD present. No thyromegaly present.  Cardiovascular: Normal rate, regular rhythm and normal heart sounds.  No murmur heard.  Pulmonary/Chest: Effort normal and breath sounds normal. No respiratory distress. Abdomen: Soft, normal bowel sounds in all four quadrants, tenderness with guarding in RLQ. No HSM.  Musculoskeletal: Normal range of motion bilateral UE and LE, no joint effusions. Thoracic and Lumbar spine with no palpable step off, no para spinal muscle spasm noted. No  reproducible tenderness. Peripheral vascular: Bilateral LE no edema. Neurological: CN II-XII grossly intact with no focal deficits. Alert and oriented to person, place, and time. Coordination, balance, strength, speech and gait are normal.  Skin: Skin is warm and dry. No rash noted. No erythema.  Psychiatric: Patient has a stable mood and affect. Behavior is normal in office today. Judgment and thought content normal in office today.  Assessment & Plan  1. Major depressive disorder, recurrent episode, in partial remission with anxious distress (HCC) Stable on current medications. Using benzodiazepine sparingly.  2. Hypothyroidism, unspecified hypothyroidism type Due to have lab work rechecked.  - TSH - T3, free - T4, free  3. Fibromyalgia Refilled tramadol, using mostly before bedtime.  - traMADol (ULTRAM) 50 MG tablet; Take 1 tablet (50 mg total) by mouth every 12 (twelve) hours as needed for moderate pain or severe pain.  Dispense: 100 tablet; Refill: 0  4. Left lower quadrant pain Clinically significant findings, will treat empiric for diverticulitis and get blood work.  - CBC with  Differential/Platelet - Comprehensive metabolic panel - moxifloxacin (AVELOX) 400 MG tablet; Take 1 tablet (400 mg total) by mouth daily at 8 pm.  Dispense: 10 tablet; Refill: 0  5. Diverticulitis of large intestine without perforation or abscess without bleeding Clinically significant findings, will treat empiric for diverticulitis and get blood work.  - CBC with Differential/Platelet - Comprehensive metabolic panel - moxifloxacin (AVELOX) 400 MG tablet; Take 1 tablet (400 mg total) by mouth daily at 8 pm.  Dispense: 10 tablet; Refill: 0

## 2015-02-08 NOTE — Patient Instructions (Signed)
CLEAR LIQUID DIET FOR 2-3 DAYS ADVANCE AS TOLERATED AS LONG AS ABDOMINAL SYMPTOMS ARE IMPROVING.  Diverticulitis Diverticulitis is inflammation or infection of small pouches in your colon that form when you have a condition called diverticulosis. The pouches in your colon are called diverticula. Your colon, or large intestine, is where water is absorbed and stool is formed. Complications of diverticulitis can include:  Bleeding.  Severe infection.  Severe pain.  Perforation of your colon.  Obstruction of your colon. CAUSES  Diverticulitis is caused by bacteria. Diverticulitis happens when stool becomes trapped in diverticula. This allows bacteria to grow in the diverticula, which can lead to inflammation and infection. RISK FACTORS People with diverticulosis are at risk for diverticulitis. Eating a diet that does not include enough fiber from fruits and vegetables may make diverticulitis more likely to develop. SYMPTOMS  Symptoms of diverticulitis may include:  Abdominal pain and tenderness. The pain is normally located on the left side of the abdomen, but may occur in other areas.  Fever and chills.  Bloating.  Cramping.  Nausea.  Vomiting.  Constipation.  Diarrhea.  Blood in your stool. DIAGNOSIS  Your health care provider will ask you about your medical history and do a physical exam. You may need to have tests done because many medical conditions can cause the same symptoms as diverticulitis. Tests may include:  Blood tests.  Urine tests.  Imaging tests of the abdomen, including X-rays and CT scans. When your condition is under control, your health care provider may recommend that you have a colonoscopy. A colonoscopy can show how severe your diverticula are and whether something else is causing your symptoms. TREATMENT  Most cases of diverticulitis are mild and can be treated at home. Treatment may include:  Taking over-the-counter pain medicines.  Following  a clear liquid diet.  Taking antibiotic medicines by mouth for 7-10 days. More severe cases may be treated at a hospital. Treatment may include:  Not eating or drinking.  Taking prescription pain medicine.  Receiving antibiotic medicines through an IV tube.  Receiving fluids and nutrition through an IV tube.  Surgery. HOME CARE INSTRUCTIONS   Follow your health care provider's instructions carefully.  Follow a full liquid diet or other diet as directed by your health care provider. After your symptoms improve, your health care provider may tell you to change your diet. He or she may recommend you eat a high-fiber diet. Fruits and vegetables are good sources of fiber. Fiber makes it easier to pass stool.  Take fiber supplements or probiotics as directed by your health care provider.  Only take medicines as directed by your health care provider.  Keep all your follow-up appointments. SEEK MEDICAL CARE IF:   Your pain does not improve.  You have a hard time eating food.  Your bowel movements do not return to normal. SEEK IMMEDIATE MEDICAL CARE IF:   Your pain becomes worse.  Your symptoms do not get better.  Your symptoms suddenly get worse.  You have a fever.  You have repeated vomiting.  You have bloody or black, tarry stools. MAKE SURE YOU:   Understand these instructions.  Will watch your condition.  Will get help right away if you are not doing well or get worse.   This information is not intended to replace advice given to you by your health care provider. Make sure you discuss any questions you have with your health care provider.   Document Released: 11/21/2004 Document Revised: 02/16/2013 Document Reviewed:  01/06/2013 Elsevier Interactive Patient Education [employer].

## 2015-02-09 LAB — TSH: TSH: 0.036 u[IU]/mL — ABNORMAL LOW (ref 0.450–4.500)

## 2015-02-09 LAB — COMPREHENSIVE METABOLIC PANEL
ALT: 10 IU/L (ref 0–32)
AST: 17 IU/L (ref 0–40)
Albumin/Globulin Ratio: 2.1 (ref 1.1–2.5)
Albumin: 4.2 g/dL (ref 3.5–5.5)
Alkaline Phosphatase: 102 IU/L (ref 39–117)
BUN/Creatinine Ratio: 17 (ref 9–23)
BUN: 13 mg/dL (ref 6–24)
Bilirubin Total: 0.4 mg/dL (ref 0.0–1.2)
CO2: 24 mmol/L (ref 18–29)
Calcium: 9.1 mg/dL (ref 8.7–10.2)
Chloride: 102 mmol/L (ref 96–106)
Creatinine, Ser: 0.76 mg/dL (ref 0.57–1.00)
GFR calc Af Amer: 104 mL/min/{1.73_m2} (ref >59)
GFR calc non Af Amer: 90 mL/min/{1.73_m2} (ref >59)
Globulin, Total: 2 g/dL (ref 1.5–4.5)
Glucose: 89 mg/dL (ref 65–99)
Potassium: 4 mmol/L (ref 3.5–5.2)
Sodium: 142 mmol/L (ref 134–144)
Total Protein: 6.2 g/dL (ref 6.0–8.5)

## 2015-02-09 LAB — CBC WITH DIFFERENTIAL/PLATELET
Basophils Absolute: 0 10*3/uL (ref 0.0–0.2)
Basos: 0 %
EOS (ABSOLUTE): 0.1 10*3/uL (ref 0.0–0.4)
Eos: 1 %
Hematocrit: 42.4 % (ref 34.0–46.6)
Hemoglobin: 13.9 g/dL (ref 11.1–15.9)
Immature Grans (Abs): 0 10*3/uL (ref 0.0–0.1)
Immature Granulocytes: 0 %
Lymphocytes Absolute: 2.1 10*3/uL (ref 0.7–3.1)
Lymphs: 40 %
MCH: 30 pg (ref 26.6–33.0)
MCHC: 32.8 g/dL (ref 31.5–35.7)
MCV: 92 fL (ref 79–97)
Monocytes Absolute: 0.5 10*3/uL (ref 0.1–0.9)
Monocytes: 9 %
Neutrophils Absolute: 2.6 10*3/uL (ref 1.4–7.0)
Neutrophils: 50 %
Platelets: 187 10*3/uL (ref 150–379)
RBC: 4.63 x10E6/uL (ref 3.77–5.28)
RDW: 13.4 % (ref 12.3–15.4)
WBC: 5.2 10*3/uL (ref 3.4–10.8)

## 2015-02-09 LAB — T3, FREE: T3, Free: 3.4 pg/mL (ref 2.0–4.4)

## 2015-02-09 LAB — T4, FREE: Free T4: 1.83 ng/dL — ABNORMAL HIGH (ref 0.82–1.77)

## 2015-02-13 ENCOUNTER — Other Ambulatory Visit: Payer: Self-pay

## 2015-02-13 NOTE — Telephone Encounter (Signed)
Refill request was sent to Dr. Ashany Sundaram for approval and submission.  

## 2015-02-14 MED ORDER — LEVOTHYROXINE SODIUM 150 MCG PO TABS: 150.0000 ug | ORAL_TABLET | Freq: Every day | ORAL | Status: DC

## 2015-03-15 ENCOUNTER — Other Ambulatory Visit: Payer: Self-pay | Admitting: Family Medicine

## 2015-03-15 DIAGNOSIS — E034 Atrophy of thyroid (acquired): Secondary | ICD-10-CM

## 2015-03-16 ENCOUNTER — Other Ambulatory Visit: Payer: Self-pay | Admitting: Family Medicine

## 2015-03-16 DIAGNOSIS — E034 Atrophy of thyroid (acquired): Secondary | ICD-10-CM

## 2015-03-16 LAB — T3, FREE: T3, Free: 2.5 pg/mL (ref 2.0–4.4)

## 2015-03-16 LAB — TSH: TSH: 0.525 u[IU]/mL (ref 0.450–4.500)

## 2015-03-16 LAB — T4, FREE: Free T4: 1.19 ng/dL (ref 0.82–1.77)

## 2015-03-16 MED ORDER — LEVOTHYROXINE SODIUM 150 MCG PO TABS: 150.0000 ug | ORAL_TABLET | Freq: Every day | ORAL | Status: DC

## 2015-04-25 ENCOUNTER — Ambulatory Visit (INDEPENDENT_AMBULATORY_CARE_PROVIDER_SITE_OTHER): Payer: Managed Care, Other (non HMO) | Admitting: Adult Health

## 2015-04-25 ENCOUNTER — Encounter: Payer: Self-pay | Admitting: Adult Health

## 2015-04-25 VITALS — BP 118/78 | HR 68 | Resp 20 | Ht 67.0 in | Wt 161.0 lb

## 2015-04-25 DIAGNOSIS — R413 Other amnesia: Secondary | ICD-10-CM | POA: Diagnosis not present

## 2015-04-25 MED ORDER — DONEPEZIL HCL 10 MG PO TABS: 10.0000 mg | ORAL_TABLET | Freq: Every day | ORAL | Status: DC

## 2015-04-25 NOTE — Progress Notes (Signed)
I have reviewed and agreed above plan. 

## 2015-04-25 NOTE — Patient Instructions (Signed)
Increase Aricept to 10 mg daily  Continue Namenda If your symptoms worsen or you develop new symptoms please let us know.
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
## 2015-05-08 ENCOUNTER — Encounter: Payer: Self-pay | Admitting: Family Medicine

## 2015-05-08 ENCOUNTER — Ambulatory Visit (INDEPENDENT_AMBULATORY_CARE_PROVIDER_SITE_OTHER): Payer: Managed Care, Other (non HMO) | Admitting: Family Medicine

## 2015-05-08 VITALS — BP 118/74 | HR 79 | Temp 98.7°F | Resp 14 | Ht 67.0 in | Wt 161.0 lb

## 2015-05-08 DIAGNOSIS — F418 Other specified anxiety disorders: Principal | ICD-10-CM

## 2015-05-08 DIAGNOSIS — F3341 Major depressive disorder, recurrent, in partial remission: Secondary | ICD-10-CM | POA: Diagnosis not present

## 2015-05-08 DIAGNOSIS — M797 Fibromyalgia: Secondary | ICD-10-CM | POA: Diagnosis not present

## 2015-05-08 DIAGNOSIS — G8929 Other chronic pain: Secondary | ICD-10-CM | POA: Diagnosis not present

## 2015-05-08 DIAGNOSIS — M545 Low back pain, unspecified: Secondary | ICD-10-CM | POA: Insufficient documentation

## 2015-05-08 MED ORDER — CYCLOBENZAPRINE HCL 5 MG PO TABS: 5.0000 mg | ORAL_TABLET | Freq: Three times a day (TID) | ORAL | Status: DC | PRN

## 2015-05-08 MED ORDER — TRAMADOL HCL 50 MG PO TABS: 50.0000 mg | ORAL_TABLET | Freq: Two times a day (BID) | ORAL | Status: DC | PRN

## 2015-05-08 NOTE — Progress Notes (Signed)
Name: Stacy [name]   MRN: [id number]    DOB: 1961-01-30   Date:05/08/2015       Progress Note  Subjective  Chief Complaint  Chief Complaint  Patient presents with  . Back Pain    several years and worsening when standin radiates into bilateral hips.  [facility] refill on tramadol and flexeril    HPI  Patient is here for routine follow up of chronic conditions and to request refills prior to current PCP's departure from the practice.  They reports symptoms are stable on current treatment regimen as outlined in previous office visits.   [name] does have a history of depression with anxious distress at times for which she is using Lexapro 10 mg one a day and clonazepam 1mg  po bid prn. She reports no worsening of depressive symptoms, suicidal or homicidal ideations however her husband has brought a puppy home and she is getting very overwhelmed with the changes in her home. She is requesting counseling and therapy services.   Past Medical History  Diagnosis Date  . Headache   . Arthritis   . GERD (gastroesophageal reflux disease)   . Thyroid disease   . Hypotension   . Sjogren's syndrome ([place])   . Raynaud disease   . Brain stem lesion   . Aneurysm ([place])   . Memory loss   . Hypothyroid   . Fibromyalgia   . Migraine     Patient Active Problem List   Diagnosis Date Noted  . Chronic lumbosacral pain 05/08/2015  . Left lower quadrant pain 02/08/2015  . Diverticulitis of large intestine without perforation or abscess without bleeding 02/08/2015  . Fibromyalgia 10/07/2014  . Hypothyroidism 10/07/2014  . History of kidney stones 10/07/2014  . Major depressive disorder, recurrent episode, in partial remission with anxious distress ([place]) 10/07/2014  . Overweight (BMI 25.0-29.9) 10/07/2014  . Mild cognitive impairment 06/03/2014  . Common migraine with intractable migraine 09/23/2013  . Basal cell carcinoma 07/13/2013  . Hot flash, menopausal 07/13/2013  . Idiopathic hypotension  07/13/2013  . Calculus of kidney 07/13/2013  . Headache, migraine 07/13/2013  . 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 Blush 07/13/2013  . Sjogren's syndrome ([place]) 07/13/2013    Social History  Substance Use Topics  . Smoking status: Never Smoker   . Smokeless tobacco: Never Used  . Alcohol Use: No     Current outpatient prescriptions:  .  Calcium-Vitamin D 600-200 MG-UNIT per tablet, Take by mouth., Disp: , Rfl:  .  Cholecalciferol 1000 UNITS tablet, Take by mouth., Disp: , Rfl:  .  clonazePAM (KLONOPIN) 1 MG tablet, Take 1 tablet (1 mg total) by mouth 2 (two) times daily as needed for anxiety., Disp: 90 tablet, Rfl: 1 .  Cranberry-Vitamin C-Vitamin E (CRANBERRY/VITAMIN C TRIPLE ST PO), Take by mouth., Disp: , Rfl:  .  cyclobenzaprine (FLEXERIL) 5 MG tablet, Take 1 tablet (5 mg total) by mouth every 8 (eight) hours as needed for muscle spasms., Disp: 50 tablet, Rfl: 3 .  donepezil (ARICEPT) 10 MG tablet, Take 1 tablet (10 mg total) by mouth at bedtime., Disp: 90 tablet, Rfl: 3 .  escitalopram (LEXAPRO) 10 MG tablet, Take 1 tablet by mouth  daily, Disp: 90 tablet, Rfl: 3 .  fludrocortisone (FLORINEF) 0.1 MG tablet, Take 2 tablets by mouth two times daily, Disp: 360 tablet, Rfl: 2 .  gabapentin (NEURONTIN) 300 MG capsule, Take 1 capsule by mouth two times daily, Disp:  180 capsule, Rfl: 2 .  levothyroxine (SYNTHROID, LEVOTHROID) 150 MCG tablet, Take 1 tablet (150 mcg total) by mouth daily before breakfast., Disp: 90 tablet, Rfl: 1 .  memantine (NAMENDA) 10 MG tablet, Take 1 tablet (10 mg total) by mouth 2 (two) times daily., Disp: 60 tablet, Rfl: 11 .  omeprazole (PRILOSEC) 20 MG capsule, Take 1 capsule (20 mg total) by mouth daily., Disp: 180 capsule, Rfl: 3 .  SUMAtriptan (IMITREX) 100 MG tablet, Take by mouth., Disp: , Rfl:  .  traMADol (ULTRAM) 50 MG tablet, Take 1 tablet (50 mg total) by mouth  every 12 (twelve) hours as needed for moderate pain or severe pain., Disp: 100 tablet, Rfl: 0 .  vitamin E 400 UNIT capsule, Take 400 Units by mouth daily., Disp: , Rfl:   Past Surgical History  Procedure Laterality Date  . Replacement total knee    . Cesarean section    . Eye surgery    . Basal cell carcinoma excision    . Lithotripsy    . Abdominal hysterectomy    . Muscle biopsy    . Oophorectomy      Family History  Problem Relation Age of Onset  . Migraines Mother   . Cancer Father   . Hypertension Father   . Stroke Father   . Heart attack Father   . Cancer Paternal Aunt   . Lupus Paternal Aunt   . Diabetes Brother   . Diabetes Maternal Grandfather     Allergies  Allergen Reactions  . Amoxicillin Hives  . Penicillins Hives     Review of Systems  CONSTITUTIONAL: No significant weight changes, fever, chills, weakness. Chronic fatigue.  CARDIOVASCULAR: No chest pain, chest pressure or chest discomfort. No palpitations or edema.  RESPIRATORY: No shortness of breath, cough or sputum.  NEUROLOGICAL: No headache, dizziness, syncope, paralysis, ataxia, numbness or tingling in the extremities. Stable memory changes. No change in bowel or bladder control.  MUSCULOSKELETAL: Chronic joint pain. Chronic muscle pain. HEMATOLOGIC: No anemia, bleeding or bruising.  LYMPHATICS: No enlarged lymph nodes.  PSYCHIATRIC: Yes change in mood. No change in sleep pattern.  ENDOCRINOLOGIC: No reports of sweating, cold or heat intolerance. No polyuria or polydipsia.     Objective  BP 118/74 mmHg  Pulse 79  Temp(Src) 98.7 F (37.1 C) (Oral)  Resp 14  Ht 5\' 7"  (1.702 m)  Wt 161 lb (73.029 kg)  BMI 25.21 kg/m2  SpO2 97% Body mass index is 25.21 kg/(m^2).  Physical Exam  Constitutional: Patient appears well-developed and well-nourished. In no distress.  Cardiovascular: Normal rate, regular rhythm and normal heart sounds.  No murmur heard.  Pulmonary/Chest: Effort normal and  breath sounds normal. No respiratory distress. Musculoskeletal: Normal range of motion bilateral UE and LE, no joint effusions. Lumbar spine with no palpable step off, some paraspinal muscle spasm noted. Peripheral vascular: Bilateral LE no edema. Neurological: CN II-XII grossly intact with no focal deficits. Alert and oriented to person, place, and time. Coordination, balance, strength, speech and gait are normal.  Skin: Skin is warm and dry. No rash noted. No erythema.  Psychiatric: Patient has a tearful mood and affect. Behavior is normal in office today. Judgment and thought content normal in office today.   Recent Results (from the past 2160 hour(s))  CBC with Differential/Platelet     Status: None   Collection Time: 02/08/15 12:26 PM  Result Value Ref Range   WBC 5.2 3.4 - 10.8 x10E3/uL   RBC 4.63 3.77 -  5.28 x10E6/uL   Hemoglobin 13.9 11.1 - 15.9 g/dL   Hematocrit 42.4 34.0 - 46.6 %   MCV 92 79 - 97 fL   MCH 30.0 26.6 - 33.0 pg   MCHC 32.8 31.5 - 35.7 g/dL   RDW 13.4 12.3 - 15.4 %   Platelets 187 150 - 379 x10E3/uL   Neutrophils 50 %   Lymphs 40 %   Monocytes 9 %   Eos 1 %   Basos 0 %   Neutrophils Absolute 2.6 1.4 - 7.0 x10E3/uL   Lymphocytes Absolute 2.1 0.7 - 3.1 x10E3/uL   Monocytes Absolute 0.5 0.1 - 0.9 x10E3/uL   EOS (ABSOLUTE) 0.1 0.0 - 0.4 x10E3/uL   Basophils Absolute 0.0 0.0 - 0.2 x10E3/uL   Immature Granulocytes 0 %   Immature Grans (Abs) 0.0 0.0 - 0.1 x10E3/uL  Comprehensive metabolic panel     Status: None   Collection Time: 02/08/15 12:26 PM  Result Value Ref Range   Glucose 89 65 - 99 mg/dL   BUN 13 6 - 24 mg/dL   Creatinine, Ser 0.76 0.57 - 1.00 mg/dL   GFR calc non Af Amer 90 >59 mL/min/1.73   GFR calc Af Amer 104 >59 mL/min/1.73   BUN/Creatinine Ratio 17 9 - 23   Sodium 142 134 - 144 mmol/L    Comment:               **Please note reference interval change**   Potassium 4.0 3.5 - 5.2 mmol/L   Chloride 102 96 - 106 mmol/L    Comment:                **Please note reference interval change**   CO2 24 18 - 29 mmol/L   Calcium 9.1 8.7 - 10.2 mg/dL   Total Protein 6.2 6.0 - 8.5 g/dL   Albumin 4.2 3.5 - 5.5 g/dL   Globulin, Total 2.0 1.5 - 4.5 g/dL   Albumin/Globulin Ratio 2.1 1.1 - 2.5   Bilirubin Total 0.4 0.0 - 1.2 mg/dL   Alkaline Phosphatase 102 39 - 117 IU/L   AST 17 0 - 40 IU/L   ALT 10 0 - 32 IU/L  TSH     Status: Abnormal   Collection Time: 02/08/15 12:26 PM  Result Value Ref Range   TSH 0.036 (L) 0.450 - 4.500 uIU/mL  T3, free     Status: None   Collection Time: 02/08/15 12:26 PM  Result Value Ref Range   T3, Free 3.4 2.0 - 4.4 pg/mL  T4, free     Status: Abnormal   Collection Time: 02/08/15 12:26 PM  Result Value Ref Range   Free T4 1.83 (H) 0.82 - 1.77 ng/dL  T3, free     Status: None   Collection Time: 03/15/15  1:32 PM  Result Value Ref Range   T3, Free 2.5 2.0 - 4.4 pg/mL  T4, free     Status: None   Collection Time: 03/15/15  1:32 PM  Result Value Ref Range   Free T4 1.19 0.82 - 1.77 ng/dL  TSH     Status: None   Collection Time: 03/15/15  1:32 PM  Result Value Ref Range   TSH 0.525 0.450 - 4.500 uIU/mL     Assessment & Plan  1. Major depressive disorder, recurrent episode, in partial remission with anxious distress ([place]) I don not feel that mood changes are due to thyroid disorder as recent labs show controled hormone levels. I agree that regular therapy and  behavior techniques might benefit her. If symptoms persist I have encouraged her to consider adjustment in medication regimen in addition to therapy.  - Ambulatory referral to Psychiatry  2. Chronic lumbosacral pain We discussed potential pathology and long term risk of reoccurrence. Maintaining an ideal body habitus, regular exercise, proper lifting techniques and mindfulness of exacerbating factors will be useful in long term management.  Instructed on use of heating pad with exercises. Consider concomitant therapy with PT, massage therapist or  chiropractor. May use anti-inflammatory medication and muscle relaxer as needed.  - traMADol (ULTRAM) 50 MG tablet; Take 1 tablet (50 mg total) by mouth every 12 (twelve) hours as needed for moderate pain or severe pain.  Dispense: 100 tablet; Refill: 0 - cyclobenzaprine (FLEXERIL) 5 MG tablet; Take 1 tablet (5 mg total) by mouth every 8 (eight) hours as needed for muscle spasms.  Dispense: 50 tablet; Refill: 3  3. Fibromyalgia Stable.  - traMADol (ULTRAM) 50 MG tablet; Take 1 tablet (50 mg total) by mouth every 12 (twelve) hours as needed for moderate pain or severe pain.  Dispense: 100 tablet; Refill: 0 - cyclobenzaprine (FLEXERIL) 5 MG tablet; Take 1 tablet (5 mg total) by mouth every 8 (eight) hours as needed for muscle spasms.  Dispense: 50 tablet; Refill: 3

## 2015-05-25 DIAGNOSIS — Z79899 Other long term (current) drug therapy: Secondary | ICD-10-CM | POA: Insufficient documentation

## 2015-06-02 ENCOUNTER — Other Ambulatory Visit: Payer: Self-pay

## 2015-06-02 DIAGNOSIS — F418 Other specified anxiety disorders: Secondary | ICD-10-CM

## 2015-06-02 NOTE — Telephone Encounter (Signed)
Please suggest that patient talk with her psychiatrist about this medicine; per last note, Dr. Chauncey Cruel referred her to psychiatry I would need to see her to discuss to see if still appropriate

## 2015-06-05 NOTE — Telephone Encounter (Signed)
Patient states has an appointment coming up will discuss then.

## 2015-06-06 ENCOUNTER — Encounter: Payer: Self-pay | Admitting: Psychiatry

## 2015-06-06 ENCOUNTER — Ambulatory Visit (INDEPENDENT_AMBULATORY_CARE_PROVIDER_SITE_OTHER): Payer: Managed Care, Other (non HMO) | Admitting: Psychiatry

## 2015-06-06 VITALS — BP 110/82 | HR 72 | Temp 98.1°F | Ht 67.0 in | Wt 162.8 lb

## 2015-06-06 DIAGNOSIS — F068 Other specified mental disorders due to known physiological condition: Secondary | ICD-10-CM | POA: Diagnosis not present

## 2015-06-06 DIAGNOSIS — F429 Obsessive-compulsive disorder, unspecified: Secondary | ICD-10-CM | POA: Diagnosis not present

## 2015-06-06 DIAGNOSIS — F09 Unspecified mental disorder due to known physiological condition: Secondary | ICD-10-CM

## 2015-06-06 MED ORDER — ESCITALOPRAM OXALATE 20 MG PO TABS: 20.0000 mg | ORAL_TABLET | Freq: Every day | ORAL | Status: DC

## 2015-06-06 NOTE — Progress Notes (Signed)
Psychiatric Initial Adult Assessment   Patient Identification: Stacy [name] MRN:  [id number] Date of Evaluation:  06/06/2015 Referral Source: [facility] neuropsychology services Chief Complaint:   Chief Complaint    Anxiety; Establish Care; Fatigue     Visit Diagnosis:    ICD-9-CM ICD-10-CM   1. MDD (major depressive disorder), recurrent episode, moderate (HCC) 296.32 F33.1     History of Present Illness:    Patient is a 55 year old married female who presented for initial assessment. [facility] she was evaluated for neuropsychological testing. Patient reported that she has history of declining cognition as well as severe anxiety. She reported that she gets upset quickly when her day does not go by as planned. She reported that she has a ritual and she has to plan it on a daily basis. She reported that she has things planned on a daily basis and she will do certain things in certain order. She would get mad and upset and will have crying episodes. Patient became tearful when she was explained this in detail. She reported that she has obsessive thoughts about cleaning her house and compulsive behavior. She will be Checking , organizing and putting things in order. She reported that her mind is racing all the time. She reported that she is unable to rest and relax. Patient reported that she was taking Topamax in the past for migraine headaches but now she has stopped taking the medication. She was diagnosed with mild cognitive impairment but she had the testing done and was started on Aricept and Namenda. She has been compliant with her medications. She has already stopped taking the Topamax and Klonopin at this time. She reported that she takes Lexapro 10 mg daily. She currently denied having any suicidal homicidal ideations or plans.      Associated Signs/Symptoms: Depression Symptoms:  fatigue, difficulty  concentrating, anxiety, (Hypo) Manic Symptoms:  Flight of Ideas, Irritable Mood, Anxiety Symptoms:  Obsessive Compulsive Symptoms:   Checking, Counting,, Psychotic Symptoms:  none PTSD Symptoms: Negative NA  Past Psychiatric History:  Patient reported history of anxiety and depression. She has never attempted suicide. She reported that she has been evaluated by trying to neuropsychology services in [facility] at the request of her neurologist Dr. Manuella Ghazi. She has never attempted suicide. She denied use of illicit drugs.  Previous Psychotropic Medications: Lexapro Aricpet Namenda Klonopin  Substance Abuse History in the last 12 months:  No.  Consequences of Substance Abuse: Negative NA  Past Medical History:  Past Medical History  Diagnosis Date  . Headache   . Arthritis   . GERD (gastroesophageal reflux disease)   . Thyroid disease   . Hypotension   . Sjogren's syndrome ([place])   . Raynaud disease   . Brain stem lesion   . Aneurysm ([place])   . Memory loss   . Hypothyroid   . Fibromyalgia   . Migraine     Past Surgical History  Procedure Laterality Date  . Replacement total knee    . Cesarean section    . Eye surgery    . Basal cell carcinoma excision    . Lithotripsy    . Abdominal hysterectomy    . Muscle biopsy    . Oophorectomy      Family Psychiatric History:  Mother has severe depression. She was psychiatrically hospilized H/O SA.   Family History:  Family History  Problem Relation Age of Onset  . Migraines Mother   . Anxiety disorder  Mother   . Depression Mother   . Cancer Father   . Hypertension Father   . Stroke Father   . Heart attack Father   . Alcohol abuse Father   . Cancer Paternal Aunt   . Lupus Paternal Aunt   . Diabetes Brother   . Diabetes Maternal Grandfather   . Alcohol abuse Sister   . Drug abuse Brother     Social History:   Social History   Social History  . Marital Status: Married    Spouse Name: [name]  .  Number of Children: 2  . Years of Education: 14   Occupational History  . Disabled    Social History Main Topics  . Smoking status: Never Smoker   . Smokeless tobacco: Never Used  . Alcohol Use: No  . Drug Use: No  . Sexual Activity: Yes    Birth Control/ Protection: None   Other Topics Concern  . None   Social History Narrative   Lives at home with husband. [name]).    Disabled.   Education college.   Right handed.   2 cups caffeine/daily    Additional Social History:   Married x 31 years.  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 She was Unable to finish her work and she was demoted to a lower job and then she was laid off while in [place]. She came to [place] 2 years ago. Her husband works in [employer].  She has 2 girls and 1 boy and they live in [place]. She reported that she spends time at home organizing and cleaning   Allergies:   Allergies  Allergen Reactions  . Amoxicillin Hives  . Penicillins Hives    Metabolic Disorder Labs: No results found for: HGBA1C, MPG No results found for: PROLACTIN No results found for: CHOL, TRIG, HDL, CHOLHDL, VLDL, LDLCALC   Current Medications: Current Outpatient Prescriptions  Medication Sig Dispense Refill  . Calcium-Vitamin D 600-200 MG-UNIT per tablet Take by mouth.    . Cholecalciferol 1000 UNITS tablet Take by mouth.    . clonazePAM (KLONOPIN) 1 MG tablet Take 1 tablet (1 mg total) by mouth 2 (two) times daily as needed for anxiety. 90 tablet 1  . Cranberry-Vitamin C-Vitamin E (CRANBERRY/VITAMIN C TRIPLE ST PO) Take by mouth.    . cyclobenzaprine (FLEXERIL) 5 MG tablet Take 1 tablet (5 mg total) by mouth every 8 (eight) hours as needed for muscle spasms. 50 tablet 3  . donepezil (ARICEPT) 10 MG tablet Take 1 tablet (10 mg total) by mouth at bedtime. 90 tablet 3  . escitalopram (LEXAPRO) 10 MG tablet Take 1 tablet by mouth  daily 90 tablet 3  . fludrocortisone (FLORINEF) 0.1 MG tablet Take 2 tablets by mouth two  times daily 360 tablet 2  . gabapentin (NEURONTIN) 300 MG capsule Take 1 capsule by mouth two times daily 180 capsule 2  . hydroxychloroquine (PLAQUENIL) 200 MG tablet Take by mouth.    . levothyroxine (SYNTHROID, LEVOTHROID) 150 MCG tablet Take 1 tablet (150 mcg total) by mouth daily before breakfast. 90 tablet 1  . memantine (NAMENDA) 10 MG tablet Take 1 tablet (10 mg total) by mouth 2 (two) times daily. 60 tablet 11  . omeprazole (PRILOSEC) 20 MG capsule Take 1 capsule (20 mg total) by mouth daily. 180 capsule 3  . SUMAtriptan (IMITREX) 100 MG tablet Take by mouth.    . traMADol (ULTRAM) 50 MG tablet Take 1 tablet (50 mg total)  by mouth every 12 (twelve) hours as needed for moderate pain or severe pain. 100 tablet 0  . vitamin E 400 UNIT capsule Take 400 Units by mouth daily.     No current facility-administered medications for this visit.    Neurologic: Headache: No Seizure: No Paresthesias:No  Musculoskeletal: Strength & Muscle Tone: within normal limits Gait & Station: normal Patient leans: N/A  Psychiatric Specialty Exam: ROS  Blood pressure 110/82, pulse 72, temperature 98.1 F (36.7 C), temperature source Tympanic, height 5\' 7"  (1.702 m), weight 162 lb 12.8 oz (73.846 kg), SpO2 99 %.Body mass index is 25.49 kg/(m^2).  General Appearance: Casual  Eye Contact:  Fair  Speech:  Clear and Coherent  Volume:  Decreased  Mood:  Anxious and Depressed  Affect:  Appropriate and Congruent  Thought Process:  Coherent  Orientation:  Full (Time, Place, and Person)  Thought Content:  WDL  Suicidal Thoughts:  No  Homicidal Thoughts:  No  Memory:  Immediate;   Fair  Judgement:  Fair  Insight:  Fair  Psychomotor Activity:  Decreased  Concentration:  Fair  Recall:  [employer] of Knowledge:Fair  Language: Fair  Akathisia:  No  Handed:  Right  AIMS (if indicated):    Assets:  Communication Skills Desire for Improvement Physical Health Social Support  ADL's:  Intact   Cognition: WNL  Sleep:      Treatment Plan Summary: Medication management    Discussed with patient about her medications in detail. She reported that she has noticed some improvement since she was started on Aricept and Namenda. She will continue on Aricept and Namenda as prescribed. She has enough supply of the medication.  I will titrate Lexapro 20 mg daily to target her OCD symptoms and she demonstrated understanding. I will send a prescription to her mail order pharmacy   Follow-up in one month or earlier depending on her symptoms   More than 50% of the time spent in psychoeducation, counseling and coordination of care.    This note was generated in part or whole with voice recognition software. Voice regonition is usually quite accurate but there are transcription errors that can and very often do occur. I apologize for any typographical errors that were not detected and corrected.     Rainey Pines, MD 4/11/201710:26 AM

## 2015-06-08 ENCOUNTER — Ambulatory Visit: Payer: Managed Care, Other (non HMO) | Admitting: Psychiatry

## 2015-06-13 ENCOUNTER — Ambulatory Visit: Payer: Self-pay | Admitting: Family Medicine

## 2015-06-19 DIAGNOSIS — E039 Hypothyroidism, unspecified: Secondary | ICD-10-CM | POA: Insufficient documentation

## 2015-07-06 ENCOUNTER — Ambulatory Visit: Payer: Managed Care, Other (non HMO) | Admitting: Psychiatry

## 2015-07-18 ENCOUNTER — Encounter: Payer: Self-pay | Admitting: Psychiatry

## 2015-07-18 ENCOUNTER — Ambulatory Visit (INDEPENDENT_AMBULATORY_CARE_PROVIDER_SITE_OTHER): Payer: 59 | Admitting: Psychiatry

## 2015-07-18 VITALS — BP 120/82 | HR 67 | Temp 98.0°F | Ht 67.0 in | Wt 165.6 lb

## 2015-07-18 DIAGNOSIS — F429 Obsessive-compulsive disorder, unspecified: Secondary | ICD-10-CM

## 2015-07-18 DIAGNOSIS — F068 Other specified mental disorders due to known physiological condition: Secondary | ICD-10-CM

## 2015-07-18 DIAGNOSIS — F0390 Unspecified dementia without behavioral disturbance: Secondary | ICD-10-CM

## 2015-07-18 NOTE — Progress Notes (Signed)
Psychiatric MD Progress Note   Patient Identification: Stacy [name] MRN:  [id number] Date of Evaluation:  07/18/2015 Referral Source: [facility] neuropsychology services Chief Complaint:   Chief Complaint    Follow-up; Medication Refill     Visit Diagnosis:    ICD-9-CM ICD-10-CM   1. OCD (obsessive compulsive disorder) 300.3 F42.9     History of Present Illness:    Patient is a 55 year old married female who presented for Follow-up.Marland Kitchen [facility] where she was evaluated for neuropsychological testing. She reported that she has started improving with the help of Lexapro and she is not crying all the time. She continues to have memory problems and has been taking Namenda and Aricept on a regular basis. Patient reported that she has ritualistic thoughts in her brain and she has to jot down everything as she will forget. Patient reported that her husband is very supportive. She reported that he has asked her to write down everything when she is talking to people on the phone as she will forget the conversation. Patient reported that she has been taking her medications as prescribed as her husband will remind her about taking medications on a daily basis. She does not like using the pillbox. She appears pleasant and cooperative during the interview. She reported that she has been diagnosed with dementia by the neurology. Patient reported that she has history of declining cognition as well as severe anxiety. She reported that she gets upset quickly when her day does not go by as planned. She reported that she has a ritual and she has to plan it on a daily basis. She reported that she has things planned on a daily basis and she will do certain things in certain order.  Patient currently denied having any suicidal ideations or plans. She appeared calm and cooperative during the interview.     Associated Signs/Symptoms: Depression Symptoms:   fatigue, difficulty concentrating, anxiety, (Hypo) Manic Symptoms:  Flight of Ideas, Irritable Mood, Anxiety Symptoms:  Obsessive Compulsive Symptoms:   Checking, Counting,, Psychotic Symptoms:  none PTSD Symptoms: Negative NA  Past Psychiatric History:  Patient reported history of anxiety and depression. She has never attempted suicide. She reported that she has been evaluated by trying to neuropsychology services in [facility] at the request of her neurologist Dr. Manuella Ghazi. She has never attempted suicide. She denied use of illicit drugs.  Previous Psychotropic Medications: Lexapro Aricpet Namenda Klonopin  Substance Abuse History in the last 12 months:  No.  Consequences of Substance Abuse: Negative NA  Past Medical History:  Past Medical History  Diagnosis Date  . Headache   . Arthritis   . GERD (gastroesophageal reflux disease)   . Thyroid disease   . Hypotension   . Sjogren's syndrome ([place])   . Raynaud disease   . Brain stem lesion   . Aneurysm ([place])   . Memory loss   . Hypothyroid   . Fibromyalgia   . Migraine     Past Surgical History  Procedure Laterality Date  . Replacement total knee    . Cesarean section    . Eye surgery    . Basal cell carcinoma excision    . Lithotripsy    . Abdominal hysterectomy    . Muscle biopsy    . Oophorectomy      Family Psychiatric History:  Mother has severe depression. She was psychiatrically hospilized H/O SA.   Family History:  Family History  Problem Relation Age of Onset  .  Migraines Mother   . Anxiety disorder Mother   . Depression Mother   . Cancer Father   . Hypertension Father   . Stroke Father   . Heart attack Father   . Alcohol abuse Father   . Cancer Paternal Aunt   . Lupus Paternal Aunt   . Diabetes Brother   . Diabetes Maternal Grandfather   . Alcohol abuse Sister   . Drug abuse Brother     Social History:   Social History   Social History  . Marital Status: Married     Spouse Name: [name]  . Number of Children: 2  . Years of Education: 14   Occupational History  . Disabled    Social History Main Topics  . Smoking status: Never Smoker   . Smokeless tobacco: Never Used  . Alcohol Use: No  . Drug Use: No  . Sexual Activity: Yes    Birth Control/ Protection: None   Other Topics Concern  . None   Social History Narrative   Lives at home with husband. [name]).    Disabled.   Education college.   Right handed.   2 cups caffeine/daily    Additional Social History:   Married x 31 years.  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 She was Unable to finish her work and she was demoted to a lower job and then she was laid off while in [place]. She came to [place] 2 years ago. Her husband works in [employer].  She has 2 girls and 1 boy and they live in [place]. She reported that she spends time at home organizing and cleaning   Allergies:   Allergies  Allergen Reactions  . Amoxicillin Hives  . Penicillins Hives    Metabolic Disorder Labs: No results found for: HGBA1C, MPG No results found for: PROLACTIN No results found for: CHOL, TRIG, HDL, CHOLHDL, VLDL, LDLCALC   Current Medications: Current Outpatient Prescriptions  Medication Sig Dispense Refill  . Calcium-Vitamin D 600-200 MG-UNIT per tablet Take by mouth.    . Cholecalciferol 1000 UNITS tablet Take by mouth.    . Cranberry-Vitamin C-Vitamin E (CRANBERRY/VITAMIN C TRIPLE ST PO) Take by mouth.    . cyclobenzaprine (FLEXERIL) 5 MG tablet Take 1 tablet (5 mg total) by mouth every 8 (eight) hours as needed for muscle spasms. 50 tablet 3  . donepezil (ARICEPT) 10 MG tablet Take 1 tablet (10 mg total) by mouth at bedtime. 90 tablet 3  . escitalopram (LEXAPRO) 20 MG tablet Take 1 tablet (20 mg total) by mouth daily. 90 tablet 2  . fludrocortisone (FLORINEF) 0.1 MG tablet Take 2 tablets by mouth two times daily 360 tablet 2  . gabapentin (NEURONTIN) 300 MG capsule Take 1 capsule by mouth  two times daily 180 capsule 2  . hydroxychloroquine (PLAQUENIL) 200 MG tablet Take by mouth.    . levothyroxine (SYNTHROID, LEVOTHROID) 150 MCG tablet Take 1 tablet (150 mcg total) by mouth daily before breakfast. 90 tablet 1  . memantine (NAMENDA) 10 MG tablet Take 1 tablet (10 mg total) by mouth 2 (two) times daily. 60 tablet 11  . omeprazole (PRILOSEC) 20 MG capsule Take 1 capsule (20 mg total) by mouth daily. 180 capsule 3  . SUMAtriptan (IMITREX) 100 MG tablet Take by mouth.    . traMADol (ULTRAM) 50 MG tablet Take 1 tablet (50 mg total) by mouth every 12 (twelve) hours as needed for moderate pain or severe pain. 100 tablet 0  .  vitamin E 400 UNIT capsule Take 400 Units by mouth daily.     No current facility-administered medications for this visit.    Neurologic: Headache: No Seizure: No Paresthesias:No  Musculoskeletal: Strength & Muscle Tone: within normal limits Gait & Station: normal Patient leans: N/A  Psychiatric Specialty Exam: ROS   Blood pressure 120/82, pulse 67, temperature 98 F (36.7 C), temperature source Tympanic, height 5\' 7"  (1.702 m), weight 165 lb 9.6 oz (75.116 kg), SpO2 99 %.Body mass index is 25.93 kg/(m^2).  General Appearance: Casual  Eye Contact:  Fair  Speech:  Clear and Coherent  Volume:  Decreased  Mood:  Anxious and Depressed  Affect:  Appropriate and Congruent  Thought Process:  Coherent  Orientation:  Full (Time, Place, and Person)  Thought Content:  WDL  Suicidal Thoughts:  No  Homicidal Thoughts:  No  Memory:  Immediate;   Fair  Judgement:  Fair  Insight:  Fair  Psychomotor Activity:  Decreased  Concentration:  Fair  Recall:  [employer] of Knowledge:Fair  Language: Fair  Akathisia:  No  Handed:  Right  AIMS (if indicated):    Assets:  Communication Skills Desire for Improvement Physical Health Social Support  ADL's:  Intact  Cognition: WNL  Sleep:      Treatment Plan Summary: Medication management    Discussed with  patient about her medications in detail. She reported that she has noticed some improvement since she was started on Aricept and Namenda. She will continue on Aricept and Namenda as prescribed. She has enough supply of the medication.  Continue  Lexapro 20 mg daily to target her OCD symptoms and she demonstrated understanding.   Follow-up in one month or earlier depending on her symptoms   More than 50% of the time spent in psychoeducation, counseling and coordination of care.    This note was generated in part or whole with voice recognition software. Voice regonition is usually quite accurate but there are transcription errors that can and very often do occur. I apologize for any typographical errors that were not detected and corrected.     Rainey Pines, MD 5/23/20179:01 AM

## 2015-08-15 ENCOUNTER — Other Ambulatory Visit: Payer: Self-pay

## 2015-08-15 DIAGNOSIS — E034 Atrophy of thyroid (acquired): Secondary | ICD-10-CM

## 2015-08-15 NOTE — Telephone Encounter (Signed)
Patient has switched providers; Rx denied Her new provider should be asked to refill her medicines

## 2015-08-17 ENCOUNTER — Ambulatory Visit (INDEPENDENT_AMBULATORY_CARE_PROVIDER_SITE_OTHER): Payer: Self-pay | Admitting: Psychiatry

## 2015-10-26 ENCOUNTER — Ambulatory Visit (INDEPENDENT_AMBULATORY_CARE_PROVIDER_SITE_OTHER): Payer: Managed Care, Other (non HMO) | Admitting: Neurology

## 2015-10-26 ENCOUNTER — Encounter: Payer: Self-pay | Admitting: Neurology

## 2015-10-26 VITALS — BP 110/70 | HR 60 | Resp 20 | Ht 67.0 in | Wt 162.0 lb

## 2015-10-26 DIAGNOSIS — E038 Other specified hypothyroidism: Secondary | ICD-10-CM | POA: Diagnosis not present

## 2015-10-26 DIAGNOSIS — E034 Atrophy of thyroid (acquired): Secondary | ICD-10-CM

## 2015-10-26 DIAGNOSIS — F0390 Unspecified dementia without behavioral disturbance: Secondary | ICD-10-CM

## 2015-10-26 MED ORDER — MEMANTINE HCL 10 MG PO TABS: 10.0000 mg | ORAL_TABLET | Freq: Two times a day (BID) | ORAL | 4 refills | Status: DC

## 2015-10-26 MED ORDER — DONEPEZIL HCL 10 MG PO TABS: 10.0000 mg | ORAL_TABLET | Freq: Every day | ORAL | 4 refills | Status: DC

## 2015-10-26 NOTE — Progress Notes (Signed)
PATIENT: Stacy [name] DOB: 1960/08/10    HISTORY OF PRESENT ILLNESS:  HISTORY  INITIAL VISIT  [name]): Shalese Gedney 55 yo RH female referred by her primary care physician Dr. Nadine Counts for evaluation of short-term memory trouble, initial visit was on April 2016.  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  She now lives at home with her husband, noticed difficulty at her daily activity, left the stove unattended, almost flooded her kitchen, still driving,drove herself to office visit today, with the help of GPS  She is interested in clinical trials, of mild cognitive impairment  She came in with [facility], [facility], board impairment across cognitive domains, she had particular trouble with working memory, Firefighter, cognitive flexibility, the pattern indicate primarily subcortical dysfunction, although the cause is not clear, she does not appear to be experienced depression, anxiety, or other psychological disorders, that would have a negative impact on cognition this was done by Florencia Reasons   Update Jul 14 2014: I have reviewed previous MRI report, from 2006-2012, and personally reviewed most recent MRI of the brain with without contrast in August 2012, showed a stable tectal plate glioma, 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  Fatigue, eye itching, cold intolerance. Skin urination, joint pain, aching muscles, bruising easily, memory loss  ALLERGIES: Allergies  Allergen Reactions  . Amoxicillin Hives  . Penicillins Hives    HOME MEDICATIONS: Outpatient Medications Prior to Visit  Medication Sig Dispense Refill  . Calcium-Vitamin D 600-200 MG-UNIT per tablet Take by mouth.    . Cholecalciferol 1000 UNITS tablet Take by mouth.    . Cranberry-Vitamin C-Vitamin E (CRANBERRY/VITAMIN C TRIPLE ST PO) Take by mouth.    . cyclobenzaprine (FLEXERIL) 5 MG tablet Take 1 tablet (5 mg total) by mouth every 8 (eight) hours as needed for muscle spasms. 50 tablet 3  . donepezil (ARICEPT) 10 MG tablet Take 1 tablet (10 mg total) by mouth at bedtime. 90 tablet 3  . escitalopram (LEXAPRO) 20 MG tablet Take 1 tablet (20 mg total) by mouth daily. 90 tablet 2  . fludrocortisone (FLORINEF) 0.1 MG tablet Take 2 tablets by mouth two times daily 360 tablet 2  . gabapentin (NEURONTIN) 300 MG capsule Take 1 capsule by mouth two times daily 180 capsule 2  . hydroxychloroquine (PLAQUENIL) 200 MG tablet Take by mouth.    . levothyroxine (SYNTHROID, LEVOTHROID) 150  MCG tablet Take 1 tablet (150 mcg total) by mouth daily before breakfast. 90 tablet 1  . memantine (NAMENDA) 10 MG tablet Take 1 tablet (10 mg total) by mouth 2 (two) times daily. 60 tablet 11  . omeprazole (PRILOSEC) 20 MG capsule Take 1 capsule (20 mg total) by mouth daily. 180 capsule 3  . SUMAtriptan  (IMITREX) 100 MG tablet Take by mouth.    . vitamin E 400 UNIT capsule Take 400 Units by mouth daily.    . traMADol (ULTRAM) 50 MG tablet Take 1 tablet (50 mg total) by mouth every 12 (twelve) hours as needed for moderate pain or severe pain. 100 tablet 0   No facility-administered medications prior to visit.     PAST MEDICAL HISTORY: Past Medical History:  Diagnosis Date  . Aneurysm ([place])   . Arthritis   . Brain stem lesion   . Fibromyalgia   . GERD (gastroesophageal reflux disease)   . Headache   . Hypotension   . Hypothyroid   . Memory loss   . Migraine   . Raynaud disease   . Sjogren's syndrome ([place])   . Thyroid disease     PAST SURGICAL HISTORY: Past Surgical History:  Procedure Laterality Date  . ABDOMINAL HYSTERECTOMY    . BASAL CELL CARCINOMA EXCISION    . CESAREAN SECTION    . EYE SURGERY    . LITHOTRIPSY    . MUSCLE BIOPSY    . OOPHORECTOMY    . REPLACEMENT TOTAL KNEE      FAMILY HISTORY: Family History  Problem Relation Age of Onset  . Migraines Mother   . Anxiety disorder Mother   . Depression Mother   . Cancer Father   . Hypertension Father   . Stroke Father   . Heart attack Father   . Alcohol abuse Father   . Cancer Paternal Aunt   . Lupus Paternal Aunt   . Diabetes Brother   . Diabetes Maternal Grandfather   . Alcohol abuse Sister   . Drug abuse Brother     SOCIAL HISTORY: Social History   Social History  . Marital status: Married    Spouse name: [name]  . Number of children: 2  . Years of education: 14   Occupational History  . Disabled    Social History Main Topics  . Smoking status: Never Smoker  . Smokeless tobacco: Never Used  . Alcohol use No  . Drug use: No  . Sexual activity: Yes    Birth control/ protection: None   Other Topics Concern  . Not on file   Social History Narrative   Lives at home with husband. [name]).    Disabled.   Education college.   Right handed.   2 cups caffeine/daily      PHYSICAL  EXAM  Vitals:   10/26/15 0733  BP: 110/70  Pulse: 60  Resp: 20  Weight: 162 lb (73.5 kg)  Height: 5\' 7"  (1.702 m)   Body mass index is 25.37 kg/m.   MMSE - Mini Mental State Exam 10/26/2015 04/25/2015 10/25/2014  Orientation to time 4 4 4   Orientation to Place 3 4 5   Registration 3 3 3   Attention/ Calculation 2 1 5   Recall 1 2 3   Language- name 2 objects 2 2 2   Language- repeat 1 1 1   Language- follow 3 step command 3 3 3   Language- read & follow direction 1 1 1   Write a sentence 1 1 1   Copy design  0 1 1  Total score 21 23 29      Generalized: Well developed, in no acute distress   Neurological examination  Mentation: Alert oriented to time, place, history taking. Follows all commands speech and language fluent Cranial nerve II-XII: Pupils were equal round reactive to light. Extraocular movements were full, visual field were full on confrontational test. Facial sensation and strength were normal. Uvula tongue midline. Head turning and shoulder shrug  were normal and symmetric. Motor: The motor testing reveals 5 over 5 strength of all 4 extremities. Good symmetric motor tone is noted throughout.  Sensory: Sensory testing is intact to soft touch on all 4 extremities. No evidence of extinction is noted.  Coordination: Cerebellar testing reveals good finger-nose-finger and heel-to-shin bilaterally.  Gait and station: Gait is normal. Tandem gait is normal-But is slow and requires concentration. Romberg is negative. No drift is seen.  Reflexes: Deep tendon reflexes are symmetric and normal bilaterally.   DIAGNOSTIC DATA (LABS, IMAGING, TESTING) - I reviewed patient records, labs, notes, testing and imaging myself where available.  Lab Results  Component Value Date   WBC 5.2 02/08/2015   HCT 42.4 02/08/2015   MCV 92 02/08/2015   PLT 187 02/08/2015      Component Value Date/Time   NA 142 02/08/2015 1226   K 4.0 02/08/2015 1226   CL 102 02/08/2015 1226   CO2 24 02/08/2015  1226   GLUCOSE 89 02/08/2015 1226   BUN 13 02/08/2015 1226   CREATININE 0.76 02/08/2015 1226   CALCIUM 9.1 02/08/2015 1226   PROT 6.2 02/08/2015 1226   ALBUMIN 4.2 02/08/2015 1226   AST 17 02/08/2015 1226   ALT 10 02/08/2015 1226   ALKPHOS 102 02/08/2015 1226   BILITOT 0.4 02/08/2015 1226   GFRNONAA 90 02/08/2015 1226   GFRAA 104 02/08/2015 1226   Lab Results  Component Value Date   TSH 0.525 03/15/2015      ASSESSMENT AND PLAN 55 y.o. year old female   Dementia without behavioral issue  Progressively worse  MRI of the brain in 2016 showed a stable tectal lesion  Laboratory evaluation today to rule out treatable cause  Refilled her Aricept and Namenda  Hypothyroidism  On supplement, most recent TSH was low, we will repeat thyroid function test..    Marcial Pacas, M.D. Ph.D.  [facility] [place], [place] [address] Phone: [phone] Fax:      [phone]

## 2015-10-27 ENCOUNTER — Telehealth: Payer: Self-pay | Admitting: Neurology

## 2015-10-27 DIAGNOSIS — F0390 Unspecified dementia without behavioral disturbance: Secondary | ICD-10-CM

## 2015-10-27 DIAGNOSIS — E0789 Other specified disorders of thyroid: Secondary | ICD-10-CM

## 2015-10-27 LAB — THYROID PANEL WITH TSH
Free Thyroxine Index: 2 (ref 1.2–4.9)
T3 Uptake Ratio: 27 % (ref 24–39)
T4, Total: 7.4 ug/dL (ref 4.5–12.0)
TSH: 1.78 u[IU]/mL (ref 0.450–4.500)

## 2015-10-27 LAB — FOLATE: Folate: >20 ng/mL (ref >3.0)

## 2015-10-27 LAB — C-REACTIVE PROTEIN: CRP: <0.3 mg/L (ref 0.0–4.9)

## 2015-10-27 LAB — SEDIMENTATION RATE: Sed Rate: 2 mm/hr (ref 0–40)

## 2015-10-27 LAB — HIV ANTIBODY (ROUTINE TESTING W REFLEX): HIV Screen 4th Generation wRfx: NONREACTIVE

## 2015-10-27 LAB — THYROID PEROXIDASE ANTIBODY: Thyroperoxidase Ab SerPl-aCnc: 220 IU/mL — ABNORMAL HIGH (ref 0–34)

## 2015-10-27 LAB — THYROGLOBULIN ANTIBODY: Thyroglobulin Antibody: 3.9 IU/mL — ABNORMAL HIGH (ref 0.0–0.9)

## 2015-10-27 LAB — VITAMIN B12: Vitamin B-12: 354 pg/mL (ref 211–946)

## 2015-10-27 LAB — ANA W/REFLEX IF POSITIVE: Anti Nuclear Antibody(ANA): NEGATIVE

## 2015-10-27 LAB — RPR: RPR Ser Ql: NONREACTIVE

## 2015-10-27 NOTE — Telephone Encounter (Signed)
I have talked with her husband about abnormal laboratory evaluation, elevated thyroid peroxidase antibody, antiglobulin antibody, normal thyroid functional test, I will refer her to endocrinologist.  Stacy [name], please call her husband to schedule a follow-up appointment at his convenience, so he can come with his wife, we can discuss potential further evaluations such as a lumbar puncture, EEG, even potential trial of treatment plan.

## 2015-10-31 NOTE — Telephone Encounter (Signed)
Spoke to pt's husband and she has been rescheduled for this month.  He will come to the appt with her.

## 2015-11-13 ENCOUNTER — Encounter: Payer: Self-pay | Admitting: Neurology

## 2015-11-13 ENCOUNTER — Ambulatory Visit (INDEPENDENT_AMBULATORY_CARE_PROVIDER_SITE_OTHER): Payer: Managed Care, Other (non HMO) | Admitting: Neurology

## 2015-11-13 VITALS — BP 114/71 | HR 71 | Ht 67.0 in | Wt 166.2 lb

## 2015-11-13 DIAGNOSIS — E034 Atrophy of thyroid (acquired): Secondary | ICD-10-CM

## 2015-11-13 DIAGNOSIS — E038 Other specified hypothyroidism: Secondary | ICD-10-CM

## 2015-11-13 DIAGNOSIS — F0390 Unspecified dementia without behavioral disturbance: Secondary | ICD-10-CM

## 2015-11-13 DIAGNOSIS — E0789 Other specified disorders of thyroid: Secondary | ICD-10-CM

## 2015-11-13 NOTE — Progress Notes (Signed)
PATIENT: Stacy [name] DOB: 12/11/1960    HISTORY OF PRESENT ILLNESS:  HISTORY  INITIAL VISIT  Stacy [name]): Stacy [name] 56 yo RH female referred by her primary care physician Dr. Nadine Counts for evaluation of short-term memory trouble, initial visit was on April 2016.  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  She now lives at home with her husband, noticed difficulty at her daily activity, left the stove unattended, almost flooded her kitchen, still driving,drove herself to office visit today, with the help of GPS  She is interested in clinical trials, of mild cognitive impairment  She came in with [facility], [facility], board impairment across cognitive domains, she had particular trouble with working memory, Firefighter, cognitive flexibility, the pattern indicate primarily subcortical dysfunction, although the cause is not clear, she does not appear to be experienced depression, anxiety, or other psychological disorders, that would have a negative impact on cognition this was done by Florencia Reasons   Update Jul 14 2014: I have reviewed previous MRI report, from 2006-2012, and personally reviewed most recent MRI of the brain with without contrast in August 2012, showed a stable tectal plate glioma, 5 T9Q3 mm, no evidence of hydrocephalus,  MRI of lumbar 2010, mild degenerative disc disease, no significant foraminal or canal  stenosis MRI of thoracic spine, no significant intramedullary abnormality  MRI of cervical spine, questionable cord signal changes, I will repeat MRI there was no significant abnormality found  Patient continued to have stable mild memory loss, able to drive to office today by GPS,  UPDATE Sept 18th 2017: She is with her husband at today's clinical visit, She was treated with Sjogren's disease at age 66s, presented with dry eyes dry mouth at that time, chronic fatigue, she was evaluated by rheumatologist, lip biopsy confirmed her diagnosis, she was treated with Plaquenil 240m daily since, She also began to complains of joints pain, trigger points pain. She was diagnosed with autoimmune arthritis, was treated occasionally with high dose of steroid, NSAIDs, she had episode of kidney stone.  She was noted to have agitation around 2012, she had difficulty keeping up with the work load, over few years span, she was noted to have significant memory loss, has to quit working   She is now able to take care of household chores, she continues to read, keep bills in balance. She has difficulty with multi-task.  She get tired easily, tends  to withdraw.     We have reviewed laboratory evaluation, elevated thyroglobulin antibody 3.9, thyroid peroxidase antibody, negative ANA, normal ESR C-reactive protein, folic acid,HIV, RPR, BE09 normal thyroid function test, CMP CBC  REVIEW OF SYSTEMS: Out of a complete 14 system review of symptoms, the patient complains only of the following symptoms, and all other reviewed systems are negative.  Fatigue, eye itching, cold intolerance. Skin urination, joint pain, aching muscles, bruising easily, memory loss  ALLERGIES: Allergies  Allergen Reactions  .  Amoxicillin Hives  . Penicillins Hives    HOME MEDICATIONS: Outpatient Medications Prior to Visit  Medication Sig Dispense Refill  . Calcium-Vitamin D 600-200 MG-UNIT per tablet Take by mouth.    .  Cholecalciferol 1000 UNITS tablet Take by mouth.    . Cranberry-Vitamin C-Vitamin E (CRANBERRY/VITAMIN C TRIPLE ST PO) Take by mouth.    . cyclobenzaprine (FLEXERIL) 5 MG tablet Take 1 tablet (5 mg total) by mouth every 8 (eight) hours as needed for muscle spasms. 50 tablet 3  . donepezil (ARICEPT) 10 MG tablet Take 1 tablet (10 mg total) by mouth at bedtime. 90 tablet 4  . escitalopram (LEXAPRO) 20 MG tablet Take 1 tablet (20 mg total) by mouth daily. 90 tablet 2  . fludrocortisone (FLORINEF) 0.1 MG tablet Take 2 tablets by mouth two times daily 360 tablet 2  . gabapentin (NEURONTIN) 300 MG capsule Take 1 capsule by mouth two times daily 180 capsule 2  . hydroxychloroquine (PLAQUENIL) 200 MG tablet Take by mouth.    . levothyroxine (SYNTHROID, LEVOTHROID) 150 MCG tablet Take 1 tablet (150 mcg total) by mouth daily before breakfast. 90 tablet 1  . memantine (NAMENDA) 10 MG tablet Take 1 tablet (10 mg total) by mouth 2 (two) times daily. 180 tablet 4  . omeprazole (PRILOSEC) 20 MG capsule Take 1 capsule (20 mg total) by mouth daily. 180 capsule 3  . SUMAtriptan (IMITREX) 100 MG tablet Take by mouth.    . vitamin E 400 UNIT capsule Take 400 Units by mouth daily.     No facility-administered medications prior to visit.     PAST MEDICAL HISTORY: Past Medical History:  Diagnosis Date  . Aneurysm ([place])   . Arthritis   . Brain stem lesion   . Fibromyalgia   . GERD (gastroesophageal reflux disease)   . Headache   . Hypotension   . Hypothyroid   . Memory loss   . Migraine   . Raynaud disease   . Sjogren's syndrome ([place])   . Thyroid disease     PAST SURGICAL HISTORY: Past Surgical History:  Procedure Laterality Date  . ABDOMINAL HYSTERECTOMY    . BASAL CELL CARCINOMA EXCISION    . CESAREAN SECTION    . EYE SURGERY    . LITHOTRIPSY    . MUSCLE BIOPSY    . OOPHORECTOMY    . REPLACEMENT TOTAL KNEE      FAMILY HISTORY: Family History  Problem Relation Age of Onset  . Migraines  Mother   . Anxiety disorder Mother   . Depression Mother   . Cancer Father   . Hypertension Father   . Stroke Father   . Heart attack Father   . Alcohol abuse Father   . Cancer Paternal Aunt   . Lupus Paternal Aunt   . Diabetes Brother   . Diabetes Maternal Grandfather   . Alcohol abuse Sister   . Drug abuse Brother     SOCIAL HISTORY: Social History   Social History  . Marital status: Married    Spouse name: [name]  . Number of children: 2  . Years of education: 14   Occupational History  . Disabled    Social History Main Topics  . Smoking status: Never Smoker  . Smokeless tobacco: Never Used  . Alcohol use No  . Drug use: No  . Sexual activity: Yes    Birth control/ protection: None   Other Topics Concern  . Not on file   Social History Narrative  Lives at home with husband. [name]).    Disabled.   Education college.   Right handed.   2 cups caffeine/daily      PHYSICAL EXAM  Vitals:   11/13/15 1002  BP: 114/71  Pulse: 71  Weight: 166 lb 4 oz (75.4 kg)  Height: '5\' 7"'  (1.702 m)   Body mass index is 26.04 kg/m.   MMSE - Mini Mental State Exam 11/13/2015 10/26/2015 04/25/2015  Orientation to time '3 4 4  ' Orientation to Place '5 3 4  ' Registration '3 3 3  ' Attention/ Calculation '5 2 1  ' Recall '1 1 2  ' Language- name 2 objects '2 2 2  ' Language- repeat '1 1 1  ' Language- follow 3 step command '3 3 3  ' Language- read & follow direction '1 1 1  ' Write a sentence '1 1 1  ' Copy design 1 0 1  Total score '26 21 23     ' Generalized: Well developed, in no acute distress   Neurological examination  MENTAL STATUS: Speech:    Speech is normal; fluent and spontaneous with normal comprehension.  Cognition: Mini-Mental Status Examination 26/30, animal naming 6     Orientation: She is not oriented to date, month     recent and remote memory: She missed 2/3 recall      Attention span and concentration     Normal Language, naming, repeating,spontaneous speech     Fund  of knowledge    Cranial nerve II-XII: Pupils were equal round reactive to light. Extraocular movements were full, visual field were full on confrontational test. Facial sensation and strength were normal. Uvula tongue midline. Head turning and shoulder shrug  were normal and symmetric. Motor: The motor testing reveals 5 over 5 strength of all 4 extremities. Good symmetric motor tone is noted throughout.  Sensory: Sensory testing is intact to soft touch on all 4 extremities. No evidence of extinction is noted.  Coordination: Cerebellar testing reveals good finger-nose-finger and heel-to-shin bilaterally.  Gait and station: Gait is normal. Tandem gait is normal-But is slow and requires concentration. Romberg is negative. No drift is seen.  Reflexes: Deep tendon reflexes are symmetric and normal bilaterally.   DIAGNOSTIC DATA (LABS, IMAGING, TESTING) - I reviewed patient records, labs, notes, testing and imaging myself where available.  Lab Results  Component Value Date   WBC 5.2 02/08/2015   HCT 42.4 02/08/2015   MCV 92 02/08/2015   PLT 187 02/08/2015      Component Value Date/Time   NA 142 02/08/2015 1226   K 4.0 02/08/2015 1226   CL 102 02/08/2015 1226   CO2 24 02/08/2015 1226   GLUCOSE 89 02/08/2015 1226   BUN 13 02/08/2015 1226   CREATININE 0.76 02/08/2015 1226   CALCIUM 9.1 02/08/2015 1226   PROT 6.2 02/08/2015 1226   ALBUMIN 4.2 02/08/2015 1226   AST 17 02/08/2015 1226   ALT 10 02/08/2015 1226   ALKPHOS 102 02/08/2015 1226   BILITOT 0.4 02/08/2015 1226   GFRNONAA 90 02/08/2015 1226   GFRAA 104 02/08/2015 1226   Lab Results  Component Value Date   TSH 1.780 10/26/2015      ASSESSMENT AND PLAN 55 y.o. 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   Potential high dose of steroid treatment trial    Hypothyroidism  On supplement,    Marcial Pacas, M.D. Ph.D.  [facility] [place], [place] [address] Phone: [phone] Fax:      [phone]

## 2015-11-14 ENCOUNTER — Telehealth: Payer: Self-pay | Admitting: *Deleted

## 2015-11-14 ENCOUNTER — Encounter: Payer: Self-pay | Admitting: Endocrinology

## 2015-11-14 LAB — HEMOGLOBIN A1C
Est. average glucose Bld gHb Est-mCnc: 97 mg/dL
Hgb A1c MFr Bld: 5 % (ref 4.8–5.6)

## 2015-11-14 LAB — VITAMIN D 25 HYDROXY (VIT D DEFICIENCY, FRACTURES): Vit D, 25-Hydroxy: 23.7 ng/mL — ABNORMAL LOW (ref 30.0–100.0)

## 2015-11-14 NOTE — Telephone Encounter (Signed)
-----   Message from Marcial Pacas, MD sent at 11/14/2015  2:12 PM EDT ----- Please call patient for mildly decreased vitamin D level 23.7, she should take over-the-counter vitamin D3 supplement 1000 units daily, normal A1c 5.0

## 2015-11-15 ENCOUNTER — Encounter: Payer: Self-pay | Admitting: *Deleted

## 2015-11-15 NOTE — Telephone Encounter (Signed)
Spoke to patient - she is aware of results and agreeable to the recommended supplement. 

## 2015-11-27 ENCOUNTER — Ambulatory Visit (INDEPENDENT_AMBULATORY_CARE_PROVIDER_SITE_OTHER): Payer: Managed Care, Other (non HMO) | Admitting: Neurology

## 2015-11-27 DIAGNOSIS — R410 Disorientation, unspecified: Secondary | ICD-10-CM

## 2015-11-27 DIAGNOSIS — F0390 Unspecified dementia without behavioral disturbance: Secondary | ICD-10-CM

## 2015-11-29 NOTE — Procedures (Signed)
   HISTORY: 55 years old female, presenting with progressive worsening memory loss  TECHNIQUE:  16 channel EEG was performed based on standard 10-16 international system. One channel was dedicated to EKG, which has demonstrates normal sinus rhythm of beats per minutes.  Upon awakening, the posterior background activity was well-developed, in alpha range, reactive to eye opening and closure. There was also frequent bilateral frontal predominant small amplitude beta activity   There was no evidence of epileptiform discharge.  Photic stimulation was performed, which induced a symmetric photic driving.  Hyperventilation was performed, there was no abnormality elicit.  No sleep was achieved.  CONCLUSION: This is a  normal awake EEG.  There is no electrodiagnostic evidence of epileptiform discharge.  Frequent smaller amplitude beta activity is  usually associated with benzodiazepine use.  Marcial Pacas, M.D. Ph.D.  [facility] [place], [place] [address] Phone: [phone] Fax:      [phone]

## 2015-12-04 ENCOUNTER — Encounter: Payer: Self-pay | Admitting: Neurology

## 2015-12-04 ENCOUNTER — Ambulatory Visit (INDEPENDENT_AMBULATORY_CARE_PROVIDER_SITE_OTHER): Payer: Managed Care, Other (non HMO) | Admitting: Neurology

## 2015-12-04 ENCOUNTER — Other Ambulatory Visit: Payer: Self-pay | Admitting: *Deleted

## 2015-12-04 VITALS — BP 125/80 | HR 67 | Ht 67.0 in | Wt 166.0 lb

## 2015-12-04 DIAGNOSIS — F0390 Unspecified dementia without behavioral disturbance: Secondary | ICD-10-CM | POA: Diagnosis not present

## 2015-12-04 DIAGNOSIS — E0789 Other specified disorders of thyroid: Secondary | ICD-10-CM

## 2015-12-04 DIAGNOSIS — F0391 Unspecified dementia with behavioral disturbance: Secondary | ICD-10-CM

## 2015-12-04 DIAGNOSIS — E039 Hypothyroidism, unspecified: Secondary | ICD-10-CM

## 2015-12-04 DIAGNOSIS — E034 Atrophy of thyroid (acquired): Secondary | ICD-10-CM

## 2015-12-04 NOTE — Addendum Note (Signed)
Addended by: Margorie John on: 12/04/2015 08:54 AM   Modules accepted: Orders

## 2015-12-04 NOTE — Addendum Note (Signed)
Addended by: Marcial Pacas on: 12/04/2015 08:36 AM   Modules accepted: Orders

## 2015-12-04 NOTE — Addendum Note (Signed)
Addended by: Marcial Pacas on: 12/04/2015 09:51 AM   Modules accepted: Orders

## 2015-12-04 NOTE — Progress Notes (Signed)
Patient returned for lumbar puncture today, she had early onset memory loss, serologic test is positive for thyroglobulin antibody, thyroid peroxidase is antibody  She is in left lateral recumbent position, landmark was identified, used a steroid technique, needle advanced between L3-L4 spinal process space, 3 attempts without success, patient was later changed to sitting up position, clear colorless spinal fluid, contracted for bottles, 3 cc each,  Sent for laboratory, including CSF 14 3 3,

## 2015-12-04 NOTE — Addendum Note (Signed)
Addended by: Marcial Pacas on: 12/04/2015 10:04 AM   Modules accepted: Orders

## 2015-12-05 LAB — MULTIPLE SCLEROSIS(MS) PROFILE
Albumin CSF-mCnc: 18 mg/dL (ref 11–48)
Albumin Index: 5 (ref 0–8)
Albumin: 4 g/dL (ref 3.5–5.5)
IgG (Immunoglobin G), Serum: 587 mg/dL — ABNORMAL LOW (ref 700–1600)
IgG Index, CSF: 0.4 (ref 0.0–0.7)
IgG Synthetic Rate: -2.6 mg/d
IgG, CSF: 1.1 mg/dL (ref 0.0–8.6)
IgG/Albumin Ratio, CSF: 0.06 (ref 0.00–0.25)

## 2015-12-05 LAB — CELL COUNT, CSF
Nuc cell # CSF: 1 cells/uL (ref 0–5)
RBC, CSF: 0 /uL

## 2015-12-05 LAB — VDRL, CSF: VDRL Quant, CSF: NONREACTIVE

## 2015-12-05 LAB — GLUCOSE, CSF: Glucose, CSF: 58 mg/dL (ref 40–70)

## 2015-12-05 LAB — THYROID PEROXIDASE ANTIBODY: Thyroperoxidase Ab SerPl-aCnc: 148 IU/mL — ABNORMAL HIGH (ref 0–34)

## 2015-12-05 LAB — THYROGLOBULIN ANTIBODY: Thyroglobulin Antibody: 1.8 IU/mL — ABNORMAL HIGH (ref 0.0–0.9)

## 2015-12-05 LAB — PROTEIN, CSF: Protein, CSF: 31 mg/dL (ref 0.0–44.0)

## 2015-12-07 ENCOUNTER — Telehealth: Payer: Self-pay | Admitting: Neurology

## 2015-12-07 NOTE — Telephone Encounter (Signed)
Cheryl in lab is calling them back. She states it is not a urine cup, it is a cup that is designed for that specimen. She is calling them now to explain.

## 2015-12-07 NOTE — Telephone Encounter (Signed)
Stacy [name]/ [facility] called about CSF. The specimen was brought to them in a urine cup. They want to know if it was sterile and if its CSF and not urine. Please call [phone]

## 2015-12-11 ENCOUNTER — Telehealth: Payer: Self-pay | Admitting: Neurology

## 2015-12-11 DIAGNOSIS — R413 Other amnesia: Secondary | ICD-10-CM

## 2015-12-11 NOTE — Telephone Encounter (Signed)
Please call patient, spinal fluid testing showed elevated 14.3.3, otherwise was normal,   Last MRI of the brain was in June 2016, I will repeat MRI of the brain with without contrast again and review the results at her next follow-up visit, and decide on the treatment plan.

## 2015-12-11 NOTE — Telephone Encounter (Signed)
Spoke to patient - she is aware of results and agreeable to a repeat MRI.  She will keep her follow up appt for further discussion.

## 2015-12-13 ENCOUNTER — Encounter: Payer: Self-pay | Admitting: Endocrinology

## 2015-12-13 ENCOUNTER — Ambulatory Visit (INDEPENDENT_AMBULATORY_CARE_PROVIDER_SITE_OTHER): Payer: Managed Care, Other (non HMO) | Admitting: Endocrinology

## 2015-12-13 VITALS — BP 120/78 | HR 70 | Ht 67.0 in | Wt 171.0 lb

## 2015-12-13 DIAGNOSIS — I950 Idiopathic hypotension: Secondary | ICD-10-CM

## 2015-12-13 LAB — CORTISOL
Cortisol, Plasma: 5.2 ug/dL
Cortisol, Plasma: 24.4 ug/dL

## 2015-12-13 MED ORDER — LEVOTHYROXINE SODIUM 175 MCG PO TABS: 175.0000 ug | ORAL_TABLET | Freq: Every day | ORAL | 3 refills | Status: DC

## 2015-12-13 MED ORDER — COSYNTROPIN NICU IV SYRINGE 0.25 MG/ML (STANDARD DOSE)
0.2500 mg | Freq: Once | INTRAVENOUS | Status: AC
  Administered 2015-12-13: 0.25 mg via INTRAMUSCULAR

## 2015-12-13 NOTE — Progress Notes (Signed)
Subjective:    Patient ID: Stacy [name], female    DOB: 10-29-1960, 55 y.o.   MRN: [id number]  HPI Pt reports hypothyroidism was dx'ed in 2007.  she has been on prescribed thyroid hormone therapy since then.  she has never taken kelp or any other type of non-prescribed thyroid product.  she has never had thyroid imaging.  She has never had thyroid surgery, or XRT to the neck.  He has never been on amiodarone or lithium.  She reports slight weight gain, and assoc fatigue.  She had TAH at age 52.  She says florinef is controlling hypotension well.   Past Medical History:  Diagnosis Date  . Aneurysm ([place])   . Arthritis   . Brain stem lesion   . Fibromyalgia   . GERD (gastroesophageal reflux disease)   . Headache   . Hypotension   . Hypothyroid   . Memory loss   . Migraine   . Raynaud disease   . Sjogren's syndrome ([place])   . Thyroid disease     Past Surgical History:  Procedure Laterality Date  . ABDOMINAL HYSTERECTOMY    . BASAL CELL CARCINOMA EXCISION    . CESAREAN SECTION    . EYE SURGERY    . LITHOTRIPSY    . MUSCLE BIOPSY    . OOPHORECTOMY    . REPLACEMENT TOTAL KNEE      Social History   Social History  . Marital status: Married    Spouse name: [name]  . Number of children: 2  . Years of education: 14   Occupational History  . Disabled    Social History Main Topics  . Smoking status: Never Smoker  . Smokeless tobacco: Never Used  . Alcohol use No  . Drug use: No  . Sexual activity: Yes    Birth control/ protection: None   Other Topics Concern  . Not on file   Social History Narrative   Lives at home with husband. [name]).    Disabled.   Education college.   Right handed.   2 cups caffeine/daily    Current Outpatient Prescriptions on File Prior to Visit  Medication Sig Dispense Refill  . Calcium-Vitamin D 600-200 MG-UNIT per tablet Take by mouth.    . Cholecalciferol (VITAMIN D-3) 1000 units CAPS Take by mouth daily.    . Cholecalciferol 1000  UNITS tablet Take by mouth.    . Cranberry-Vitamin C-Vitamin E (CRANBERRY/VITAMIN C TRIPLE ST PO) Take by mouth.    . cyclobenzaprine (FLEXERIL) 5 MG tablet Take 1 tablet (5 mg total) by mouth every 8 (eight) hours as needed for muscle spasms. 50 tablet 3  . donepezil (ARICEPT) 10 MG tablet Take 1 tablet (10 mg total) by mouth at bedtime. 90 tablet 4  . escitalopram (LEXAPRO) 20 MG tablet Take 1 tablet (20 mg total) by mouth daily. 90 tablet 2  . fludrocortisone (FLORINEF) 0.1 MG tablet Take 2 tablets by mouth two times daily 360 tablet 2  . gabapentin (NEURONTIN) 300 MG capsule Take 1 capsule by mouth two times daily 180 capsule 2  . hydroxychloroquine (PLAQUENIL) 200 MG tablet Take by mouth.    . memantine (NAMENDA) 10 MG tablet Take 1 tablet (10 mg total) by mouth 2 (two) times daily. 180 tablet 4  . omeprazole (PRILOSEC) 20 MG capsule Take 1 capsule (20 mg total) by mouth daily. 180 capsule 3  . SUMAtriptan (IMITREX) 100 MG tablet Take by mouth.    . vitamin E 400 UNIT  capsule Take 400 Units by mouth daily.     No current facility-administered medications on file prior to visit.     Allergies  Allergen Reactions  . Amoxicillin Hives  . Penicillins Hives    Family History  Problem Relation Age of Onset  . Migraines Mother   . Anxiety disorder Mother   . Depression Mother   . Cancer Father   . Hypertension Father   . Stroke Father   . Heart attack Father   . Alcohol abuse Father   . Diabetes Brother   . Alcohol abuse Sister   . Drug abuse Brother   . Cancer Paternal Aunt   . Lupus Paternal Aunt   . Thyroid disease Paternal Aunt   . Diabetes Maternal Grandfather     BP 120/78   Pulse 70   Ht 5\' 7"  (1.702 m)   Wt 171 lb (77.6 kg)   SpO2 97%   BMI 26.78 kg/m    Review of Systems denies depression, fever, hair loss, sob, constipation, numbness, blurry vision, rhinorrhea, and syncope.  She has easy bruising, dry skin, cold intolerance, and leg cramps.         Objective:   Physical Exam VS: see vs page GEN: no distress HEAD: head: no deformity eyes: no periorbital swelling, no proptosis.   external nose and ears are normal.  mouth: no lesion seen NECK: supple, thyroid is not enlarged CHEST WALL: no deformity LUNGS: clear to auscultation CV: reg rate and rhythm, no murmur ABD: abdomen is soft, nontender.  no hepatosplenomegaly.  not distended.  no hernia. MUSCULOSKELETAL: muscle bulk and strength are grossly normal.  no obvious joint swelling.  gait is normal and steady.   EXTEMITIES: no deformity.  no ulcer on the feet.  feet are of normal color and temp.  Trace bilat leg edema.   PULSES: dorsalis pedis intact bilat.  no carotid bruit NEURO:  cn 2-12 grossly intact.   readily moves all 4's.  sensation is intact to touch on the feet SKIN:  Normal texture and temperature.  No rash or suspicious lesion is visible.  No vitiligo NODES:  None palpable at the neck.   PSYCH: alert, well-oriented.  Does not appear anxious nor depressed.    I have reviewed outside records, and summarized: Pt was recently seen by neurol for memory loss.  No specific cause was found.   ACTH stimulation test is done: baseline cortisol level=5 then Cosyntropin 250 mcg is given im 45 minutes later, cortisol level=24 (normal response)  TPO antibodies: pos ATG antibodies: pos Lab Results  Component Value Date   TSH 1.780 10/26/2015   T4TOTAL 7.4 10/26/2015      Assessment & Plan:  Chronic thyroiditis, which is usually hereditary.  Chronic primary hypothyroidism: well-replaced. Please continue the same medication. Hypotension: adrenal insuff is excluded: Please continue the same florinef.    Patient is advised the following: Patient Instructions  blood tests are requested for you today.  We'll let you know about the results. Please continue the same thyroid medication.   You should have a thyroid blood test, and physical examination of the thyroid each  year.

## 2015-12-13 NOTE — Patient Instructions (Addendum)
blood tests are requested for you today.  We'll let you know about the results. Please continue the same thyroid medication.   You should have a thyroid blood test, and physical examination of the thyroid each year.

## 2015-12-14 LAB — GRAM STAIN

## 2015-12-19 ENCOUNTER — Ambulatory Visit
Admission: RE | Admit: 2015-12-19 | Discharge: 2015-12-19 | Disposition: A | Discharge status: Home / Self Care | Payer: Managed Care, Other (non HMO) | Source: Ambulatory Visit | Attending: Neurology | Admitting: Neurology

## 2015-12-19 DIAGNOSIS — R413 Other amnesia: Secondary | ICD-10-CM

## 2015-12-19 MED ORDER — GADOBENATE DIMEGLUMINE 529 MG/ML IV SOLN
16.0000 mL | Freq: Once | INTRAVENOUS | Status: AC | PRN
  Administered 2015-12-19: 16 mL via INTRAVENOUS

## 2016-01-01 ENCOUNTER — Ambulatory Visit (INDEPENDENT_AMBULATORY_CARE_PROVIDER_SITE_OTHER): Payer: Managed Care, Other (non HMO) | Admitting: Neurology

## 2016-01-01 ENCOUNTER — Ambulatory Visit: Payer: Managed Care, Other (non HMO) | Admitting: Neurology

## 2016-01-01 ENCOUNTER — Encounter: Payer: Self-pay | Admitting: Neurology

## 2016-01-01 VITALS — BP 117/75 | HR 80 | Ht 67.0 in | Wt 173.0 lb

## 2016-01-01 DIAGNOSIS — G3184 Mild cognitive impairment, so stated: Secondary | ICD-10-CM | POA: Diagnosis not present

## 2016-01-01 DIAGNOSIS — M3500 Sicca syndrome, unspecified: Secondary | ICD-10-CM

## 2016-01-01 DIAGNOSIS — K5732 Diverticulitis of large intestine without perforation or abscess without bleeding: Secondary | ICD-10-CM

## 2016-01-01 NOTE — Progress Notes (Signed)
PATIENT: Stacy [name] DOB: 1960-09-19    HISTORY OF PRESENT ILLNESS:  HISTORY  INITIAL VISIT  [name]): Evi Mccomb 55 yo RH female referred by her primary care physician Dr. Nadine Counts for evaluation of short-term memory trouble, initial visit was on April 2016.  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  She now lives at home with her husband, noticed difficulty at her daily activity, left the stove unattended, almost flooded her kitchen, still driving,drove herself to office visit today, with the help of GPS  She is interested in clinical trials, of mild cognitive impairment  She came in with [facility], [facility], board impairment across cognitive domains, she had particular trouble with working memory, Firefighter, cognitive flexibility, the pattern indicate primarily subcortical dysfunction, although the cause is not clear, she does not appear to be experienced depression, anxiety, or other psychological disorders, that would have a negative impact on cognition this was done by Florencia Reasons   Update Jul 14 2014: I have reviewed previous MRI report, from 2006-2012, and personally reviewed most recent MRI of the brain with without contrast in August 2012, showed a stable tectal plate glioma, 5 W2H8 mm, no evidence of hydrocephalus,  MRI of lumbar 2010, mild degenerative disc disease, no significant foraminal or canal  stenosis MRI of thoracic spine, no significant intramedullary abnormality  MRI of cervical spine, questionable cord signal changes, I will repeat MRI there was no significant abnormality found  Patient continued to have stable mild memory loss, able to drive to office today by GPS,  UPDATE Sept 18th 2017: She is with her husband at today's clinical visit, She was treated with Sjogren's disease at age 50s, presented with dry eyes dry mouth at that time, chronic fatigue, she was evaluated by rheumatologist, lip biopsy confirmed her diagnosis, she was treated with Plaquenil 264m daily since, She also began to complains of joints pain, trigger points pain. She was diagnosed with autoimmune arthritis, was treated occasionally with high dose of steroid, NSAIDs, she had episode of kidney stone.  She was noted to have agitation around 2012, she had difficulty keeping up with the work load, over few years span, she was noted to have significant memory loss, has to quit working   She is now able to take care of household chores, she continues to read, keep bills in balance. She has difficulty with multi-task.  She get tired easily, tends  to withdraw.     We have reviewed laboratory evaluation, elevated thyroglobulin antibody 3.9, thyroid peroxidase antibody, negative ANA, normal ESR C-reactive protein, folic acid,HIV, RPR, BN27 normal thyroid function test, CMP CBC  UPDATE Nov 6th 2017: She is alone at today's clinical visit, since last visit in September she had more extensive evaluations,  She was evaluated by endocrinologist Dr. ELoanne Drilling ACTH stimulation test showed no significant abnormality,  Repeat laboratory evaluation continue to show positive thyroid peroxidase antibody, antiglobulin antibody, with normal  thyroid functional test,  CSF study showed normal protein, 31, negative VDRL, MS panel,oligoclonal banding, but to mild +'14 3 3 ' protein, A1c was 5.0, vitamin D level was mildly decreased  23.7, negative inflammatory markers, such as negative ANA, ESR, C-reactive protein. HIV, Normal folic acid,  I personally reviewed repeat MRI of the brain with and without contrast in October 2017 showed stable 5 times 5 times 5 mm cystic tectal lesions, otherwise no significant abnormality.  EEG was normal  REVIEW OF SYSTEMS: Out of a complete 14 system review of symptoms, the patient complains only of the following symptoms, and all other reviewed systems are negative.  Fatigue, eye itching, cold intolerance. Skin urination, joint pain, aching muscles, bruising easily, memory loss  ALLERGIES: Allergies  Allergen Reactions  . Amoxicillin Hives  . Penicillins Hives    HOME MEDICATIONS: Outpatient Medications Prior to Visit  Medication Sig Dispense Refill  . Calcium-Vitamin D 600-200 MG-UNIT per tablet Take by mouth.    . Cholecalciferol (VITAMIN D-3) 1000 units CAPS Take by mouth daily.    . Cholecalciferol 1000 UNITS tablet Take by mouth.    . Cranberry-Vitamin C-Vitamin E (CRANBERRY/VITAMIN C TRIPLE ST PO) Take by mouth.    . cyclobenzaprine (FLEXERIL) 5 MG tablet Take 1 tablet (5 mg total) by mouth every 8 (eight) hours as needed for muscle spasms. 50 tablet 3  . donepezil (ARICEPT) 10 MG tablet Take 1 tablet (10 mg total) by mouth at bedtime. 90 tablet 4  . escitalopram (LEXAPRO) 20 MG tablet Take 1 tablet (20 mg total) by mouth daily. 90 tablet 2  . fludrocortisone (FLORINEF) 0.1 MG tablet Take 2 tablets by mouth two times daily 360 tablet 2  . gabapentin (NEURONTIN) 300 MG capsule Take 1 capsule by mouth two times daily 180 capsule 2  . hydroxychloroquine (PLAQUENIL) 200 MG tablet Take by mouth.    . levothyroxine (SYNTHROID, LEVOTHROID) 175 MCG tablet Take 1 tablet (175 mcg total) by mouth daily before breakfast. 90 tablet 3  . memantine (NAMENDA) 10 MG tablet Take 1 tablet (10 mg total) by mouth 2 (two) times daily. 180 tablet 4  . omeprazole (PRILOSEC) 20 MG capsule Take 1  capsule (20 mg total) by mouth daily. 180 capsule 3  . SUMAtriptan (IMITREX) 100 MG tablet Take by mouth.    . vitamin E 400 UNIT capsule Take 400 Units by mouth daily.     No facility-administered medications prior to visit.     PAST MEDICAL HISTORY: Past Medical History:  Diagnosis Date  . Aneurysm ([place])   . Arthritis   . Brain stem lesion   . Fibromyalgia   . GERD (gastroesophageal reflux disease)   . Headache   . Hypotension   . Hypothyroid   . Memory loss   . Migraine   . Raynaud disease   . Sjogren's syndrome ([place])   . Thyroid disease     PAST SURGICAL HISTORY: Past Surgical History:  Procedure Laterality Date  . ABDOMINAL HYSTERECTOMY    . BASAL CELL CARCINOMA EXCISION    . CESAREAN SECTION    . EYE SURGERY    . LITHOTRIPSY    . MUSCLE BIOPSY    . OOPHORECTOMY    . REPLACEMENT TOTAL KNEE      FAMILY HISTORY: Family History  Problem Relation Age of Onset  . Migraines Mother   . Anxiety disorder Mother   . Depression Mother   . Cancer Father   . Hypertension Father   .  Stroke Father   . Heart attack Father   . Alcohol abuse Father   . Diabetes Brother   . Alcohol abuse Sister   . Drug abuse Brother   . Cancer Paternal Aunt   . Lupus Paternal Aunt   . Thyroid disease Paternal Aunt   . Diabetes Maternal Grandfather     SOCIAL HISTORY: Social History   Social History  . Marital status: Married    Spouse name: [name]  . Number of children: 2  . Years of education: 14   Occupational History  . Disabled    Social History Main Topics  . Smoking status: Never Smoker  . Smokeless tobacco: Never Used  . Alcohol use No  . Drug use: No  . Sexual activity: Yes    Birth control/ protection: None   Other Topics Concern  . Not on file   Social History Narrative   Lives at home with husband. [name]).    Disabled.   Education college.   Right handed.   2 cups caffeine/daily      PHYSICAL EXAM  Vitals:   01/01/16 1510  BP: 117/75   Pulse: 80  Weight: 173 lb (78.5 kg)  Height: '5\' 7"'  (1.702 m)   Body mass index is 27.1 kg/m.   MMSE - Mini Mental State Exam 11/13/2015 10/26/2015 04/25/2015  Orientation to time '3 4 4  ' Orientation to Place '5 3 4  ' Registration '3 3 3  ' Attention/ Calculation '5 2 1  ' Recall '1 1 2  ' Language- name 2 objects '2 2 2  ' Language- repeat '1 1 1  ' Language- follow 3 step command '3 3 3  ' Language- read & follow direction '1 1 1  ' Write a sentence '1 1 1  ' Copy design 1 0 1  Total score '26 21 23     ' Generalized: Well developed, in no acute distress   Neurological examination  MENTAL STATUS: Speech:    Speech is normal; fluent and spontaneous with normal comprehension.  Cognition: Mini-Mental Status Examination 26/30, animal naming 6     Orientation: She is not oriented to date, month     recent and remote memory: She missed 2/3 recall      Attention span and concentration     Normal Language, naming, repeating,spontaneous speech     Fund of knowledge    Cranial nerve II-XII: Pupils were equal round reactive to light. Extraocular movements were full, visual field were full on confrontational test. Facial sensation and strength were normal. Uvula tongue midline. Head turning and shoulder shrug  were normal and symmetric. Motor: The motor testing reveals 5 over 5 strength of all 4 extremities. Good symmetric motor tone is noted throughout.  Sensory: Sensory testing is intact to soft touch on all 4 extremities. No evidence of extinction is noted.  Coordination: Cerebellar testing reveals good finger-nose-finger and heel-to-shin bilaterally.  Gait and station: Gait is normal. Tandem gait is normal-But is slow and requires concentration. Romberg is negative. No drift is seen.  Reflexes: Deep tendon reflexes are symmetric and normal bilaterally.   DIAGNOSTIC DATA (LABS, IMAGING, TESTING) - I reviewed patient records, labs, notes, testing and imaging myself where available.  Lab Results  Component  Value Date   WBC 5.2 02/08/2015   HCT 42.4 02/08/2015   MCV 92 02/08/2015   PLT 187 02/08/2015      Component Value Date/Time   NA 142 02/08/2015 1226   K 4.0 02/08/2015 1226   CL 102 02/08/2015 1226  CO2 24 02/08/2015 1226   GLUCOSE 89 02/08/2015 1226   BUN 13 02/08/2015 1226   CREATININE 0.76 02/08/2015 1226   CALCIUM 9.1 02/08/2015 1226   PROT 6.2 02/08/2015 1226   ALBUMIN 4.0 12/04/2015 0910   AST 17 02/08/2015 1226   ALT 10 02/08/2015 1226   ALKPHOS 102 02/08/2015 1226   BILITOT 0.4 02/08/2015 1226   GFRNONAA 90 02/08/2015 1226   GFRAA 104 02/08/2015 1226   Lab Results  Component Value Date   TSH 1.780 10/26/2015      ASSESSMENT AND PLAN 55 y.o. year old female   Dementia without behavioral issue  Only slowly progressive worse,  MRI of the brain in 2016 showed a stable tectal lesion  Laboratory evaluation showed elevated thyroid globally antibody, thyroid peroxidase antibody  Differentiation diagnosis includes possible Hashimoto's encephalitis  Extensive evaluations including spinal fluid testing, EEG showed no significant abnormality  We will refer her for repeat neuropsychiatric evaluation to see if any significant progression of her cognitive function, will also see if depression play a role in her complaints of memory.  We had previous discuss about possible high dose of empirically steroid treatment, but this can be associated with significant side effect,   Hypothyroidism  On supplement,    Marcial Pacas, M.D. Ph.D.  [facility] [place], [place] [address] Phone: [phone] Fax:      [phone]

## 2016-01-04 LAB — FUNGUS CULTURE W SMEAR

## 2016-02-01 ENCOUNTER — Other Ambulatory Visit: Payer: Self-pay | Admitting: Psychiatry

## 2016-03-18 ENCOUNTER — Encounter: Payer: Managed Care, Other (non HMO) | Admitting: Psychology

## 2016-03-21 ENCOUNTER — Other Ambulatory Visit: Payer: Self-pay | Admitting: Psychiatry

## 2016-03-26 ENCOUNTER — Other Ambulatory Visit: Payer: Self-pay | Admitting: Family

## 2016-03-26 DIAGNOSIS — Z1231 Encounter for screening mammogram for malignant neoplasm of breast: Secondary | ICD-10-CM

## 2016-04-01 ENCOUNTER — Ambulatory Visit
Admission: RE | Admit: 2016-04-01 | Discharge: 2016-04-01 | Disposition: A | Discharge status: Home / Self Care | Payer: Managed Care, Other (non HMO) | Source: Ambulatory Visit | Attending: Family | Admitting: Family

## 2016-04-01 DIAGNOSIS — Z1231 Encounter for screening mammogram for malignant neoplasm of breast: Secondary | ICD-10-CM

## 2016-04-25 ENCOUNTER — Encounter: Payer: Managed Care, Other (non HMO) | Admitting: Psychology

## 2016-05-02 ENCOUNTER — Ambulatory Visit: Payer: Managed Care, Other (non HMO) | Admitting: Nurse Practitioner

## 2016-07-01 ENCOUNTER — Ambulatory Visit: Payer: Managed Care, Other (non HMO) | Admitting: Neurology

## 2016-07-01 ENCOUNTER — Telehealth: Payer: Self-pay | Admitting: *Deleted

## 2016-07-01 NOTE — Telephone Encounter (Signed)
Called to cancel follow up the same day as appt.  She got held up at her husband's PCP appt.

## 2016-07-02 ENCOUNTER — Encounter: Payer: Self-pay | Admitting: Neurology

## 2016-07-02 DIAGNOSIS — Z0289 Encounter for other administrative examinations: Secondary | ICD-10-CM

## 2016-07-08 ENCOUNTER — Encounter: Payer: Self-pay | Admitting: Emergency Medicine

## 2016-07-08 ENCOUNTER — Emergency Department
Admission: EM | Admit: 2016-07-08 | Discharge: 2016-07-08 | Disposition: A | Discharge status: Home / Self Care | Payer: Managed Care, Other (non HMO) | Attending: Emergency Medicine | Admitting: Emergency Medicine

## 2016-07-08 ENCOUNTER — Emergency Department: Payer: Managed Care, Other (non HMO)

## 2016-07-08 DIAGNOSIS — R55 Syncope and collapse: Secondary | ICD-10-CM | POA: Diagnosis not present

## 2016-07-08 DIAGNOSIS — N3000 Acute cystitis without hematuria: Secondary | ICD-10-CM | POA: Insufficient documentation

## 2016-07-08 LAB — CBC
HCT: 38.7 % (ref 35.0–47.0)
Hemoglobin: 13.6 g/dL (ref 12.0–16.0)
MCH: 32.4 pg (ref 26.0–34.0)
MCHC: 35.2 g/dL (ref 32.0–36.0)
MCV: 92.3 fL (ref 80.0–100.0)
Platelets: 180 10*3/uL (ref 150–440)
RBC: 4.19 MIL/uL (ref 3.80–5.20)
RDW: 12.8 % (ref 11.5–14.5)
WBC: 6.9 10*3/uL (ref 3.6–11.0)

## 2016-07-08 LAB — BASIC METABOLIC PANEL
Anion gap: 11 (ref 5–15)
BUN: 13 mg/dL (ref 6–20)
CO2: 23 mmol/L (ref 22–32)
Calcium: 9.2 mg/dL (ref 8.9–10.3)
Chloride: 105 mmol/L (ref 101–111)
Creatinine, Ser: 0.64 mg/dL (ref 0.44–1.00)
GFR calc Af Amer: >60 mL/min (ref >60)
GFR calc non Af Amer: >60 mL/min (ref >60)
Glucose, Bld: 87 mg/dL (ref 65–99)
Potassium: 3.3 mmol/L — ABNORMAL LOW (ref 3.5–5.1)
Sodium: 139 mmol/L (ref 135–145)

## 2016-07-08 LAB — URINALYSIS, COMPLETE (UACMP) WITH MICROSCOPIC
Bilirubin Urine: NEGATIVE
Glucose, UA: NEGATIVE mg/dL
Hgb urine dipstick: NEGATIVE
Ketones, ur: NEGATIVE mg/dL
Nitrite: NEGATIVE
Protein, ur: 30 mg/dL — AB
Specific Gravity, Urine: 1.01 (ref 1.005–1.030)
pH: 9 — ABNORMAL HIGH (ref 5.0–8.0)

## 2016-07-08 LAB — TROPONIN I: Troponin I: <0.03 ng/mL (ref <0.03)

## 2016-07-08 MED ORDER — SULFAMETHOXAZOLE-TRIMETHOPRIM 800-160 MG PO TABS
ORAL_TABLET | ORAL | Status: AC
  Filled 2016-07-08: qty 1

## 2016-07-08 MED ORDER — SULFAMETHOXAZOLE-TRIMETHOPRIM 800-160 MG PO TABS: 1.0000 | ORAL_TABLET | Freq: Two times a day (BID) | ORAL | 0 refills | Status: DC

## 2016-07-08 MED ORDER — SULFAMETHOXAZOLE-TRIMETHOPRIM 800-160 MG PO TABS
1.0000 | ORAL_TABLET | Freq: Once | ORAL | Status: AC
  Administered 2016-07-08: 1 via ORAL
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
## 2016-07-08 NOTE — Discharge Instructions (Signed)
Please drink plenty of water to stay well-hydrated. Please take the entire course of antibiotics, even if you're feeling better. Please make a follow-up appointment with your primary care physician. Return to the emergency department if you develop lightheadedness or fainting, fever, chest pain, or any other symptoms concerning to you.

## 2016-07-24 ENCOUNTER — Other Ambulatory Visit: Payer: Self-pay | Admitting: Neurology

## 2016-07-24 DIAGNOSIS — R55 Syncope and collapse: Secondary | ICD-10-CM

## 2016-07-24 DIAGNOSIS — G3184 Mild cognitive impairment, so stated: Secondary | ICD-10-CM

## 2016-07-24 DIAGNOSIS — R9089 Other abnormal findings on diagnostic imaging of central nervous system: Secondary | ICD-10-CM

## 2016-08-01 ENCOUNTER — Ambulatory Visit: Payer: Managed Care, Other (non HMO)

## 2016-08-06 ENCOUNTER — Ambulatory Visit
Admission: RE | Admit: 2016-08-06 | Discharge: 2016-08-06 | Disposition: A | Discharge status: Home / Self Care | Payer: Managed Care, Other (non HMO) | Source: Ambulatory Visit | Attending: Neurology | Admitting: Neurology

## 2016-08-06 DIAGNOSIS — R55 Syncope and collapse: Secondary | ICD-10-CM | POA: Insufficient documentation

## 2016-08-06 DIAGNOSIS — G3184 Mild cognitive impairment, so stated: Secondary | ICD-10-CM | POA: Insufficient documentation

## 2016-08-06 DIAGNOSIS — G9389 Other specified disorders of brain: Secondary | ICD-10-CM | POA: Diagnosis not present

## 2016-08-06 DIAGNOSIS — R9089 Other abnormal findings on diagnostic imaging of central nervous system: Secondary | ICD-10-CM | POA: Insufficient documentation

## 2016-08-06 MED ORDER — GADOBENATE DIMEGLUMINE 529 MG/ML IV SOLN
15.0000 mL | Freq: Once | INTRAVENOUS | Status: AC | PRN
  Administered 2016-08-06: 15 mL via INTRAVENOUS

## 2016-09-09 ENCOUNTER — Ambulatory Visit: Payer: Managed Care, Other (non HMO) | Admitting: Neurology

## 2017-08-07 ENCOUNTER — Other Ambulatory Visit (HOSPITAL_COMMUNITY): Payer: Self-pay | Admitting: Nurse Practitioner

## 2017-08-07 ENCOUNTER — Other Ambulatory Visit: Payer: Self-pay | Admitting: Nurse Practitioner

## 2017-08-07 DIAGNOSIS — G3184 Mild cognitive impairment, so stated: Secondary | ICD-10-CM

## 2017-09-10 ENCOUNTER — Ambulatory Visit (HOSPITAL_COMMUNITY)
Admission: RE | Admit: 2017-09-10 | Discharge: 2017-09-10 | Disposition: A | Discharge status: Home / Self Care | Payer: Managed Care, Other (non HMO) | Source: Ambulatory Visit | Attending: Nurse Practitioner | Admitting: Nurse Practitioner

## 2017-09-10 DIAGNOSIS — G3184 Mild cognitive impairment, so stated: Secondary | ICD-10-CM | POA: Insufficient documentation

## 2017-12-04 ENCOUNTER — Ambulatory Visit (INDEPENDENT_AMBULATORY_CARE_PROVIDER_SITE_OTHER): Payer: Managed Care, Other (non HMO) | Admitting: Endocrinology

## 2017-12-04 ENCOUNTER — Encounter: Payer: Self-pay | Admitting: Endocrinology

## 2017-12-04 VITALS — BP 124/74 | HR 75 | Ht 67.0 in | Wt 155.2 lb

## 2017-12-04 DIAGNOSIS — I950 Idiopathic hypotension: Secondary | ICD-10-CM | POA: Diagnosis not present

## 2017-12-04 LAB — CORTISOL: Cortisol, Plasma: 4.7 ug/dL

## 2017-12-04 NOTE — Progress Notes (Signed)
Subjective:    Patient ID: Stacy [name], female    DOB: 12-16-1960, 57 y.o.   MRN: [id number]  HPI Pt returns for f/u of hypothyroidism (dx'ed 2007; she has been on prescribed thyroid hormone therapy since then; she has never had thyroid imaging). She also has hypotension (dx'ed 2017, whenACTH stim test was normal; she was rx'ed florinef). She had another episode of syncope 2 weeks ago, despite still taking the florinef.  She was seen at [facility] this AM, and ref back here Past Medical History:  Diagnosis Date  . Aneurysm ([place])   . Arthritis   . Brain stem lesion   . Fibromyalgia   . GERD (gastroesophageal reflux disease)   . Headache   . Hypotension   . Hypothyroid   . Memory loss   . Migraine   . Raynaud disease   . Sjogren's syndrome ([place])   . Thyroid disease     Past Surgical History:  Procedure Laterality Date  . ABDOMINAL HYSTERECTOMY    . BASAL CELL CARCINOMA EXCISION    . BREAST BIOPSY    . CESAREAN SECTION    . EYE SURGERY    . LITHOTRIPSY    . MUSCLE BIOPSY    . OOPHORECTOMY      Social History   Socioeconomic History  . Marital status: Married    Spouse name: [name]  . Number of children: 2  . Years of education: 12  . Highest education level: Not on file  Occupational History  . Occupation: Disabled  Social Needs  . Financial resource strain: Not on file  . Food insecurity:    Worry: Not on file    Inability: Not on file  . Transportation needs:    Medical: Not on file    Non-medical: Not on file  Tobacco Use  . Smoking status: Never Smoker  . Smokeless tobacco: Never Used  Substance and Sexual Activity  . Alcohol use: No    Alcohol/week: 0.0 standard drinks  . Drug use: No  . Sexual activity: Yes    Birth control/protection: None  Lifestyle  . Physical activity:    Days per week: Not on file    Minutes per session: Not on file  . Stress: Not on file  Relationships  . Social connections:    Talks on phone: Not on file    Gets  together: Not on file    Attends religious service: Not on file    Active member of club or organization: Not on file    Attends meetings of clubs or organizations: Not on file    Relationship status: Not on file  . Intimate partner violence:    Fear of current or ex partner: Not on file    Emotionally abused: Not on file    Physically abused: Not on file    Forced sexual activity: Not on file  Other Topics Concern  . Not on file  Social History Narrative   Lives at home with husband. [name]).    Disabled.   Education college.   Right handed.   2 cups caffeine/daily    Current Outpatient Medications on File Prior to Visit  Medication Sig Dispense Refill  . Calcium-Vitamin D 600-200 MG-UNIT per tablet Take by mouth.    . Cholecalciferol (VITAMIN D-3) 1000 units CAPS Take by mouth daily.    . Cholecalciferol 1000 UNITS tablet Take by mouth.    . Cranberry-Vitamin C-Vitamin E (CRANBERRY/VITAMIN C TRIPLE ST PO) Take by mouth.    Marland Kitchen  fludrocortisone (FLORINEF) 0.1 MG tablet Take 2 tablets by mouth two times daily 360 tablet 2  . gabapentin (NEURONTIN) 300 MG capsule Take 1 capsule by mouth two times daily 180 capsule 2  . hydroxychloroquine (PLAQUENIL) 200 MG tablet Take by mouth.    . levothyroxine (SYNTHROID, LEVOTHROID) 175 MCG tablet Take 1 tablet (175 mcg total) by mouth daily before breakfast. 90 tablet 3  . omeprazole (PRILOSEC) 20 MG capsule Take 1 capsule (20 mg total) by mouth daily. 180 capsule 3  . vitamin E 400 UNIT capsule Take 400 Units by mouth daily.    Marland Kitchen donepezil (ARICEPT) 10 MG tablet Take 1 tablet (10 mg total) by mouth at bedtime. 90 tablet 4  . PARoxetine (PAXIL) 20 MG tablet      No current facility-administered medications on file prior to visit.     Allergies  Allergen Reactions  . Amoxicillin Hives  . Penicillins Hives    Family History  Problem Relation Age of Onset  . Migraines Mother   . Anxiety disorder Mother   . Depression Mother   . Cancer  Father   . Hypertension Father   . Stroke Father   . Heart attack Father   . Alcohol abuse Father   . Diabetes Brother   . Alcohol abuse Sister   . Drug abuse Brother   . Cancer Paternal Aunt   . Lupus Paternal Aunt   . Thyroid disease Paternal Aunt   . Diabetes Maternal Grandfather     BP 124/74 (BP Location: Right Arm, Patient Position: Sitting)   Pulse 75   Ht 5\' 7"  (1.702 m)   Wt 155 lb 3.2 oz (70.4 kg)   SpO2 98%   BMI 24.31 kg/m    Review of Systems Denies change in skin tone.      Objective:   Physical Exam VITAL SIGNS:  See vs page GENERAL: no distress NECK: There is no palpable thyroid enlargement.  No thyroid nodule is palpable.  No palpable lymphadenopathy at the anterior neck.  Gait: normal and steady  ACTH stim test is not done today, as we are out of cosyntropin.      Assessment & Plan:  Hypothyroidism: due for recheck.   Hypotension: check random cortisol.    Patient Instructions  blood tests are requested for you today.  We'll let you know about the results.   Please continue the same fludrocortisone.   Please see the heart doctor at [facility].

## 2017-12-04 NOTE — Patient Instructions (Addendum)
blood tests are requested for you today.  We'll let you know about the results.   Please continue the same fludrocortisone.   Please see the heart doctor at [facility]

## 2017-12-08 ENCOUNTER — Telehealth: Payer: Self-pay | Admitting: Endocrinology

## 2017-12-08 NOTE — Telephone Encounter (Signed)
-----   Message from Eliott Nine sent at 12/08/2017  5:45 PM EDT ----- Regarding: labs Please put labs in patient coming in the morning 12-09-2017.  thanks

## 2017-12-08 NOTE — Telephone Encounter (Signed)
The next step is to make sure we have cosyntropin.  Has it come in?

## 2017-12-09 ENCOUNTER — Other Ambulatory Visit: Payer: Self-pay | Admitting: Endocrinology

## 2017-12-09 ENCOUNTER — Other Ambulatory Visit (INDEPENDENT_AMBULATORY_CARE_PROVIDER_SITE_OTHER): Payer: Managed Care, Other (non HMO)

## 2017-12-09 DIAGNOSIS — I950 Idiopathic hypotension: Secondary | ICD-10-CM | POA: Diagnosis not present

## 2017-12-09 LAB — CORTISOL
Cortisol, Plasma: 8.6 ug/dL
Cortisol, Plasma: 24.9 ug/dL

## 2017-12-09 MED ORDER — COSYNTROPIN NICU IV SYRINGE 0.25 MG/ML (STANDARD DOSE)
0.2500 mg | Freq: Once | INTRAVENOUS | Status: AC
  Administered 2017-12-09: 0.25 mg via INTRAVENOUS

## 2017-12-09 NOTE — Progress Notes (Unsigned)
Per orders of Dr Ellison injection of 0.25 mg cortrosyn given today by Mitul Hallowell RMA . Patient tolerated injection well. 

## 2017-12-09 NOTE — Telephone Encounter (Signed)
Pt was seen and labs performed this morning

## 2017-12-10 ENCOUNTER — Other Ambulatory Visit: Payer: Self-pay

## 2017-12-10 ENCOUNTER — Ambulatory Visit (INDEPENDENT_AMBULATORY_CARE_PROVIDER_SITE_OTHER): Payer: 59 | Admitting: Psychiatry

## 2017-12-10 ENCOUNTER — Encounter: Payer: Self-pay | Admitting: Psychiatry

## 2017-12-10 VITALS — BP 136/76 | HR 76 | Temp 98.8°F | Wt 154.0 lb

## 2017-12-10 DIAGNOSIS — F3181 Bipolar II disorder: Secondary | ICD-10-CM | POA: Diagnosis not present

## 2017-12-10 DIAGNOSIS — F09 Unspecified mental disorder due to known physiological condition: Secondary | ICD-10-CM

## 2017-12-10 MED ORDER — LURASIDONE HCL 40 MG PO TABS: 20.0000 mg | ORAL_TABLET | Freq: Every day | ORAL | 0 refills | Status: DC

## 2017-12-10 NOTE — Patient Instructions (Signed)
Lurasidone oral tablet What is this medicine? LURASIDONE (loo RAS i done) is an antipsychotic. It is used to treat schizophrenia or bipolar disorder, also known as manic-depression. This medicine may be used for other purposes; ask your health care provider or pharmacist if you have questions. COMMON BRAND NAME(S): Latuda What should I tell my health care provider before I take this medicine? They need to know if you have any of these conditions: -dementia -diabetes -difficulty swallowing -heart disease -history of breast cancer -kidney disease -liver disease -low blood counts, like low white cell, platelet, or red cell counts -low blood pressure -Parkinson's disease -seizures -suicidal thoughts, plans, or attempt; a previous suicide attempt by you or a family member -an unusual or allergic reaction to lurasidone, other medicines, foods, dyes, or preservatives -pregnant or trying to get pregnant -breast-feeding How should I use this medicine? Take this medicine by mouth with a glass of water. Follow the directions on the prescription label. Take this medicine with food. Take your medicine at regular intervals. Do not take it more often than directed. Do not stop taking except on your doctor's advice. Talk to your pediatrician regarding the use of this medicine in children. While this drug may be prescribed for children as young as 13 years for selected conditions, precautions do apply. Overdosage: If you think you have taken too much of this medicine contact a poison control center or emergency room at once. NOTE: This medicine is only for you. Do not share this medicine with others. What if I miss a dose? If you miss a dose, take it as soon as you can. If it is almost time for your next dose, take only that dose. Do not take double or extra doses. What may interact with this medicine? Do not take this medicine with any of the following medications: -dalfopristin;  quinupristin -delavirdine -indinavir -isoniazid -itraconazole -ketoconazole -lumacaftor; ivacaftor -metoclopramide -rifampin -ritonavir -tipranavir This medicine may also interact with the following medications: -alcohol -certain medicines for anxiety or sleep -diltiazem -digoxin -lithium -medicines for blood pressure This list may not describe all possible interactions. Give your health care provider a list of all the medicines, herbs, non-prescription drugs, or dietary supplements you use. Also tell them if you smoke, drink alcohol, or use illegal drugs. Some items may interact with your medicine. What should I watch for while using this medicine? Visit your doctor or health care professional for regular checks on your progress. It may be several weeks before you see the full effects of this medicine. Notify your doctor or health care professional if your symptoms get worse, if you have new symptoms, if you are having an unusual effect from this medicine, or if you feel out of control, very discouraged or think you might harm yourself or others. Do not suddenly stop taking this medicine. You may need to gradually reduce the dose. Ask your doctor or health care professional for advice. You may get dizzy or drowsy. Do not drive, use machinery, or do anything that needs mental alertness until you know how this medicine affects you. Do not stand or sit up quickly, especially if you are an older patient. This reduces the risk of dizzy or fainting spells. Alcohol can increase dizziness and drowsiness. Avoid alcoholic drinks. This medicine may cause dry eyes and blurred vision. If you wear contact lenses you may feel some discomfort. Lubricating drops may help. See your eye doctor if the problem does not go away or is severe. This medicine can   reduce the response of your body to heat or cold. Dress warm in cold weather and stay hydrated in hot weather. If possible, avoid extreme temperatures like  saunas, hot tubs, very hot or cold showers, or activities that can cause dehydration such as vigorous exercise. What side effects may I notice from receiving this medicine? Side effects that you should report to your doctor or health care professional as soon as possible: -allergic reactions like skin rash, itching or hives, swelling of the face, lips, or tongue -confusion -feeling faint or lightheaded, falls -fever or chills, sore throat -high fever -inability to control muscle movements in the face, mouth, hands, arms, or legs -increased hunger or thirst -increased urination -missed menstrual periods -restlessness or need to keep moving -seizures -stiffness, spasms, trembling -suicidal thoughts or other mood changes -unusually weak or tired Side effects that usually do not require medical attention (report to your doctor or health care professional if they continue or are bothersome): -blurred vision -change in sex drive or performance -diarrhea -drowsiness -nausea, vomiting -weight gain This list may not describe all possible side effects. Call your doctor for medical advice about side effects. You may report side effects to FDA at 1-800-FDA-1088. Where should I keep my medicine? Keep out of the reach of children. Store at room temperature between 15 and 30 degrees C (59 and 86 degrees F). Throw away any unused medicine after the expiration date. NOTE: This sheet is a summary. It may not cover all possible information. If you have questions about this medicine, talk to your doctor, pharmacist, or health care provider.  2018 Elsevier/Gold Standard (2015-03-29 10:23:22)  

## 2017-12-10 NOTE — Progress Notes (Signed)
Psychiatric Initial Adult Assessment   Patient Identification: Stacy [name] MRN:  [id number] Date of Evaluation:  12/10/2017 Referral Source:Dr.Hemang Manuella Ghazi Chief Complaint:  ' I am here for anxiety Chief Complaint    Establish Care; Anxiety; Panic Attack; Depression     Visit Diagnosis:    ICD-10-CM   1. Bipolar 2 disorder (HCC) F31.81    most recent episode depressed with mixed features  2. Mild cognitive disorder F09     History of Present Illness:  Stacy [name] is a 57 yr old female, Caucasian, unemployed, married, lives in [place], has a history of mild cognitive disorder, Sjogren's syndrome, hypertension, arthritis, hypothyroidism, migraine, presented to the clinic today to establish care.  Patient reports she has been struggling with mood lability all her life.  She describes her mood symptoms as having periods when she is irritable, more self-confident, needs less sleep, more talkative, easily distracted, has more energy, more active and has multiple goal-directed activities and so on which can last for a few days.  She reports she also has periods when she crashes into a depressive phase.  She reports her depressive phase can last for 2-3 days and it happens very frequently every week or so.  She reports she goes through periods when she is sad, has crying spells, lack of energy, anhedonia, lack of motivation and so on.  She reports she cannot find any psychosocial stressors which could be contributing to this.  She reports her husband is very supportive and has a good relationship with her children and grandchildren.  She is also financially sound.  Patient reports she quit working 6 years ago,.  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  She reports she was told that she may have a brain cyst and she started having cognitive changes.  She describes her cognitive problems as inability to learn new stuff, getting lost while driving without her  GPS, word finding difficulties, and so on.  She reports she is under the care of her neurologist Dr. Manuella Ghazi at this time.  Based on his notes most recent SLUMS 23 out of 30 on 09/18/2017.  She is also on Aricept which she takes daily.  Patient reports anxiety symptoms on and off.  She reports she often feels on edge, anxious, restless, inability to sit still which can last for a couple of days.  Unknown if this is part of her hypomanic phase.  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  Patient denies any suicidality.  Patient denies any perceptual disturbances.  Patient denies any homicidality.  Patient does report she had a rough childhood.  She reports her father gambled and abused alcohol and her mother had bipolar disorder.  Her mother was committed to facilities several times, attempted suicide several times and also had ECT treatments.  She has a brother who abused drugs and a sister who also was a drug abuser.  Patient reports she was in psychotherapy all her childhood years which helped to some extent.  She reports she remembers having significant depressive symptoms as well as possible hypomanic episodes during her childhood years.  She however denies being admitted to an inpatient facility before.  Pt reports she was recently started on Paxil by her primary medical doctor.  She is currently on 40 mg.  She reports she has been on Paxil since the past 1-1-1/2 months.  She describes Paxil as helpful to some extent.  Associated Signs/Symptoms: Depression Symptoms:  depressed mood, insomnia, difficulty concentrating, anxiety, panic attacks, decreased appetite, (Hypo) Manic Symptoms:  Distractibility, Elevated Mood, Labiality of Mood, Anxiety Symptoms:  Panic Symptoms, Psychotic Symptoms:  denies PTSD Symptoms: Had a traumatic exposure:  as noted above  Past Psychiatric History: Patient reports a history of  depression and possible hypomanic episodes.  She however was never diagnosed with bipolar disorder in the past.  She reports a diagnosis of generalized anxiety disorder in the past as well as cognitive impairment.  She was under psychotherapy treatment in the past.  She denies any suicide attempts or inpatient mental health admissions.  Previous Psychotropic Medications: Yes   Paxil  Substance Abuse History in the last 12 months:  No.  Consequences of Substance Abuse: Negative  Past Medical History:  Past Medical History:  Diagnosis Date  . Aneurysm ([place])   . Anxiety   . Arthritis   . Brain stem lesion   . Chronic kidney disease    kideny stone  . Depression   . Fibromyalgia   . GERD (gastroesophageal reflux disease)   . Headache   . Hypotension   . Hypothyroid   . Memory loss   . Migraine   . Raynaud disease   . Sjogren's syndrome ([place])   . Thyroid disease     Past Surgical History:  Procedure Laterality Date  . ABDOMINAL HYSTERECTOMY    . BASAL CELL CARCINOMA EXCISION    . BREAST BIOPSY    . CESAREAN SECTION    . EYE SURGERY    . KNEE SURGERY Left   . LITHOTRIPSY    . MUSCLE BIOPSY    . OOPHORECTOMY      Family Psychiatric History: Patient reports her mother struggled with bipolar disorder, had ECT treatments and also was in several inpatient units.  Her mother also attempted suicide several times.  Her father had alcoholism.  Her brother and sister also had drug abuse problems in the past.  Family History:  Family History  Problem Relation Age of Onset  . Migraines Mother   . Anxiety disorder Mother   . Depression Mother   . Cancer Father   . Hypertension Father   . Stroke Father   . Heart attack Father   . Alcohol abuse Father   . Diabetes Brother   . Alcohol abuse Sister   . Drug abuse Sister   . Anxiety disorder Sister   . Depression Sister   . Drug abuse Brother   . Cancer Paternal Aunt   . Lupus Paternal Aunt   . Thyroid disease Paternal Aunt    . Diabetes Maternal Grandfather   . Anxiety disorder Maternal Grandmother   . Depression Maternal Grandmother     Social History:   Social History   Socioeconomic History  . Marital status: Married    Spouse name: [name]  . Number of children: 2  . Years of education: 67  . Highest education level: Associate degree: occupational, Hotel manager, or vocational program  Occupational History  . Occupation: Disabled  Social Needs  . Financial resource strain: Not hard at all  . Food insecurity:    Worry: Never true    Inability: Never true  . Transportation needs:    Medical: No    Non-medical: No  Tobacco Use  . Smoking status: Never Smoker  . Smokeless tobacco: Never Used  Substance and Sexual Activity  . Alcohol use: No  Alcohol/week: 0.0 standard drinks  . Drug use: Never  . Sexual activity: Yes    Birth control/protection: None  Lifestyle  . Physical activity:    Days per week: 7 days    Minutes per session: 30 min  . Stress: Very much  Relationships  . Social connections:    Talks on phone: Not on file    Gets together: Not on file    Attends religious service: More than 4 times per year    Active member of club or organization: No    Attends meetings of clubs or organizations: Never    Relationship status: Married  Other Topics Concern  . Not on file  Social History Narrative   Lives at home with husband. [name]).    Disabled.   Education college.   Right handed.   2 cups caffeine/daily    Additional Social History: Patient is married.  She lives with her husband in [place].  She is currently on disability.  She has 2 adult sons and 4 grandchildren.  Allergies:   Allergies  Allergen Reactions  . Amoxicillin Hives  . Penicillins Hives    Metabolic Disorder Labs: Lab Results  Component Value Date   HGBA1C 5.0 11/13/2015   No results found for: PROLACTIN No results found for: CHOL, TRIG, HDL, CHOLHDL, VLDL, LDLCALC   Current  Medications: Current Outpatient Medications  Medication Sig Dispense Refill  . Calcium-Vitamin D 600-200 MG-UNIT per tablet Take by mouth.    . Cholecalciferol (VITAMIN D-3) 1000 units CAPS Take by mouth daily.    . Cholecalciferol 1000 UNITS tablet Take by mouth.    . Cranberry-Vitamin C-Vitamin E (CRANBERRY/VITAMIN C TRIPLE ST PO) Take by mouth.    . fludrocortisone (FLORINEF) 0.1 MG tablet Take 2 tablets by mouth two times daily 360 tablet 2  . gabapentin (NEURONTIN) 300 MG capsule Take 1 capsule by mouth two times daily 180 capsule 2  . hydroxychloroquine (PLAQUENIL) 200 MG tablet Take by mouth.    . levothyroxine (SYNTHROID, LEVOTHROID) 175 MCG tablet Take 1 tablet (175 mcg total) by mouth daily before breakfast. 90 tablet 3  . omeprazole (PRILOSEC) 20 MG capsule Take 1 capsule (20 mg total) by mouth daily. 180 capsule 3  . PARoxetine (PAXIL) 40 MG tablet Take 40 mg by mouth every morning.    . vitamin E 400 UNIT capsule Take 400 Units by mouth daily.    Marland Kitchen donepezil (ARICEPT) 10 MG tablet Take 1 tablet (10 mg total) by mouth at bedtime. 90 tablet 4  . lurasidone (LATUDA) 40 MG TABS tablet Take 0.5-1 tablets (20-40 mg total) by mouth daily with supper. Take half tablet for 10 days and increase to 1 tablet 30 tablet 0   No current facility-administered medications for this visit.     Neurologic: Headache: No Seizure: No Paresthesias:No  Musculoskeletal: Strength & Muscle Tone: within normal limits Gait & Station: normal Patient leans: N/A  Psychiatric Specialty Exam: Review of Systems  Psychiatric/Behavioral: Positive for depression. The patient is nervous/anxious and has insomnia.   All other systems reviewed and are negative.   Blood pressure 136/76, pulse 76, temperature 98.8 F (37.1 C), temperature source Oral, weight 154 lb (69.9 kg).Body mass index is 24.12 kg/m.  General Appearance: Casual  Eye Contact:  Fair  Speech:  Clear and Coherent  Volume:  Normal  Mood:   Anxious  Affect:  Congruent  Thought Process:  Goal Directed and Descriptions of Associations: Intact  Orientation:  Full (Time, Place,  and Person)  Thought Content:  Logical  Suicidal Thoughts:  No  Homicidal Thoughts:  No  Memory:  Immediate;   Fair Recent;   Fair Remote;   Fair  Judgement:  Fair  Insight:  Fair  Psychomotor Activity:  Normal  Concentration:  Concentration: Fair and Attention Span: Fair  Recall:  [employer] of Knowledge:Fair  Language: Fair  Akathisia:  No  Handed:  Right  AIMS (if indicated):  na  Assets:  Communication Skills Desire for Improvement Social Support  ADL's:  Intact  Cognition: WNL  Sleep:  restless    Treatment Plan Summary:Avonell is a 57 year old Caucasian female who has a history of mood lability, anxiety symptoms, cognitive disorder, Sjogren's syndrome, hypothyroidism, hypertension, arthritis, presented to the clinic today to establish care.  Patient is biologically predisposed given her family history of mental health problems.  She also has cognitive changes as mentioned above.  She however has good social support system.  She denies any substance abuse problems.  Pt will benefit from psychotherapy sessions as well as medication management.  Plan as noted below. Medication management and Plan as noted below  Plan Bipolar disorder type II Continue Paxil 40 mg p.o. daily Start Latuda 20 mg p.o. daily with supper for 10 days and increase to 40 mg p.o. daily.  Mild cognitive disorder Patient will continue to follow-up with neurology.  Will refer patient for CBT with therapist here in clinic.  Patient to sign a release to obtain most recent labs done with her PMD.  I have reviewed medical records in [facility] Per Dr. Manuella Ghazi.  Follow-up in clinic in 3 weeks or sooner if needed.  More than 50 % of the time was spent for psychoeducation and supportive psychotherapy and care coordination.  This note was generated in part or whole with voice  recognition software. Voice recognition is usually quite accurate but there are transcription errors that can and very often do occur. I apologize for any typographical errors that were not detected and corrected.     Ursula Alert, MD 10/16/20193:00 PM

## 2017-12-11 ENCOUNTER — Other Ambulatory Visit: Payer: Medicare Other

## 2017-12-17 ENCOUNTER — Encounter: Payer: Self-pay | Admitting: Licensed Clinical Social Worker

## 2017-12-17 ENCOUNTER — Ambulatory Visit (INDEPENDENT_AMBULATORY_CARE_PROVIDER_SITE_OTHER): Payer: 59 | Admitting: Licensed Clinical Social Worker

## 2017-12-17 DIAGNOSIS — F3181 Bipolar II disorder: Secondary | ICD-10-CM | POA: Diagnosis not present

## 2017-12-17 DIAGNOSIS — F09 Unspecified mental disorder due to known physiological condition: Secondary | ICD-10-CM | POA: Diagnosis not present
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
## 2017-12-25 ENCOUNTER — Ambulatory Visit (INDEPENDENT_AMBULATORY_CARE_PROVIDER_SITE_OTHER): Payer: 59 | Admitting: Licensed Clinical Social Worker

## 2017-12-25 ENCOUNTER — Encounter: Payer: Self-pay | Admitting: Licensed Clinical Social Worker

## 2017-12-25 DIAGNOSIS — F3181 Bipolar II disorder: Secondary | ICD-10-CM
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
## 2017-12-31 ENCOUNTER — Ambulatory Visit (INDEPENDENT_AMBULATORY_CARE_PROVIDER_SITE_OTHER): Payer: 59 | Admitting: Psychiatry

## 2017-12-31 ENCOUNTER — Telehealth: Payer: Self-pay

## 2017-12-31 ENCOUNTER — Other Ambulatory Visit: Payer: Self-pay | Admitting: Psychiatry

## 2017-12-31 ENCOUNTER — Other Ambulatory Visit: Payer: Self-pay

## 2017-12-31 ENCOUNTER — Encounter: Payer: Self-pay | Admitting: Psychiatry

## 2017-12-31 VITALS — BP 136/84 | HR 67 | Temp 98.4°F | Wt 155.6 lb

## 2017-12-31 DIAGNOSIS — F3181 Bipolar II disorder: Secondary | ICD-10-CM

## 2017-12-31 DIAGNOSIS — F09 Unspecified mental disorder due to known physiological condition: Secondary | ICD-10-CM | POA: Diagnosis not present

## 2017-12-31 MED ORDER — PAROXETINE HCL 40 MG PO TABS: 40.0000 mg | ORAL_TABLET | ORAL | 0 refills | Status: DC

## 2017-12-31 MED ORDER — LURASIDONE HCL 40 MG PO TABS
20.0000 mg | ORAL_TABLET | Freq: Two times a day (BID) | ORAL | 0 refills | Status: DC
Start: 2017-12-31 — End: 2018-01-05

## 2017-12-31 MED ORDER — TRAZODONE HCL 50 MG PO TABS
50.0000 mg | ORAL_TABLET | Freq: Every day | ORAL | 1 refills | Status: DC
Start: 2017-12-31 — End: 2017-12-31

## 2017-12-31 MED ORDER — HYDROXYZINE PAMOATE 25 MG PO CAPS: 25.0000 mg | ORAL_CAPSULE | Freq: Every day | ORAL | 0 refills | Status: DC | PRN

## 2017-12-31 NOTE — Patient Instructions (Signed)
Hydroxyzine capsules or tablets What is this medicine? HYDROXYZINE (hye [place] i zeen) is an antihistamine. This medicine is used to treat allergy symptoms. It is also used to treat anxiety and tension. This medicine can be used with other medicines to induce sleep before surgery. This medicine may be used for other purposes; ask your health care provider or pharmacist if you have questions. COMMON BRAND NAME(S): ANX, Atarax, Rezine, Vistaril What should I tell my health care provider before I take this medicine? They need to know if you have any of these conditions: -any chronic illness -difficulty passing urine -glaucoma -heart disease -kidney disease -liver disease -lung disease -an unusual or allergic reaction to hydroxyzine, cetirizine, other medicines, foods, dyes, or preservatives -pregnant or trying to get pregnant -breast-feeding How should I use this medicine? Take this medicine by mouth with a full glass of water. Follow the directions on the prescription label. You may take this medicine with food or on an empty stomach. Take your medicine at regular intervals. Do not take your medicine more often than directed. Talk to your pediatrician regarding the use of this medicine in children. Special care may be needed. While this drug may be prescribed for children as young as 75 years of age for selected conditions, precautions do apply. Patients over 62 years old may have a stronger reaction and need a smaller dose. Overdosage: If you think you have taken too much of this medicine contact a poison control center or emergency room at once. NOTE: This medicine is only for you. Do not share this medicine with others. What if I miss a dose? If you miss a dose, take it as soon as you can. If it is almost time for your next dose, take only that dose. Do not take double or extra doses. What may interact with this medicine? -alcohol -barbiturate medicines for sleep or seizures -medicines for  colds, allergies -medicines for depression, anxiety, or emotional disturbances -medicines for pain -medicines for sleep -muscle relaxants This list may not describe all possible interactions. Give your health care provider a list of all the medicines, herbs, non-prescription drugs, or dietary supplements you use. Also tell them if you smoke, drink alcohol, or use illegal drugs. Some items may interact with your medicine. What should I watch for while using this medicine? Tell your doctor or health care professional if your symptoms do not improve. You may get drowsy or dizzy. Do not drive, use machinery, or do anything that needs mental alertness until you know how this medicine affects you. Do not stand or sit up quickly, especially if you are an older patient. This reduces the risk of dizzy or fainting spells. Alcohol may interfere with the effect of this medicine. Avoid alcoholic drinks. Your mouth may get dry. Chewing sugarless gum or sucking hard candy, and drinking plenty of water may help. Contact your doctor if the problem does not go away or is severe. This medicine may cause dry eyes and blurred vision. If you wear contact lenses you may feel some discomfort. Lubricating drops may help. See your eye doctor if the problem does not go away or is severe. If you are receiving skin tests for allergies, tell your doctor you are using this medicine. What side effects may I notice from receiving this medicine? Side effects that you should report to your doctor or health care professional as soon as possible: -fast or irregular heartbeat -difficulty passing urine -seizures -slurred speech or confusion -tremor Side effects that  usually do not require medical attention (report to your doctor or health care professional if they continue or are bothersome): -constipation -drowsiness -fatigue -headache -stomach upset This list may not describe all possible side effects. Call your doctor for  medical advice about side effects. You may report side effects to FDA at 1-800-FDA-1088. Where should I keep my medicine? Keep out of the reach of children. Store at room temperature between 15 and 30 degrees C (59 and 86 degrees F). Keep container tightly closed. Throw away any unused medicine after the expiration date. NOTE: This sheet is a summary. It may not cover all possible information. If you have questions about this medicine, talk to your doctor, pharmacist, or health care provider.  2018 Elsevier/Gold Standard (2007-06-26 14:50:59) Trazodone tablets What is this medicine? TRAZODONE (TRAZ oh done) is used to treat depression. This medicine may be used for other purposes; ask your health care provider or pharmacist if you have questions. COMMON BRAND NAME(S): Desyrel What should I tell my health care provider before I take this medicine? They need to know if you have any of these conditions: -attempted suicide or thinking about it -bipolar disorder -bleeding problems -glaucoma -heart disease, or previous heart attack -irregular heart beat -kidney or liver disease -low levels of sodium in the blood -an unusual or allergic reaction to trazodone, other medicines, foods, dyes or preservatives -pregnant or trying to get pregnant -breast-feeding How should I use this medicine? Take this medicine by mouth with a glass of water. Follow the directions on the prescription label. Take this medicine shortly after a meal or a light snack. Take your medicine at regular intervals. Do not take your medicine more often than directed. Do not stop taking this medicine suddenly except upon the advice of your doctor. Stopping this medicine too quickly may cause serious side effects or your condition may worsen. A special MedGuide will be given to you by the pharmacist with each prescription and refill. Be sure to read this information carefully each time. Talk to your pediatrician regarding the use  of this medicine in children. Special care may be needed. Overdosage: If you think you have taken too much of this medicine contact a poison control center or emergency room at once. NOTE: This medicine is only for you. Do not share this medicine with others. What if I miss a dose? If you miss a dose, take it as soon as you can. If it is almost time for your next dose, take only that dose. Do not take double or extra doses. What may interact with this medicine? Do not take this medicine with any of the following medications: -certain medicines for fungal infections like fluconazole, itraconazole, ketoconazole, posaconazole, voriconazole -cisapride -dofetilide -dronedarone -linezolid -MAOIs like Carbex, Eldepryl, Marplan, Nardil, and Parnate -mesoridazine -methylene blue (injected into a vein) -pimozide -saquinavir -thioridazine -ziprasidone This medicine may also interact with the following medications: -alcohol -antiviral medicines for HIV or AIDS -aspirin and aspirin-like medicines -barbiturates like phenobarbital -certain medicines for blood pressure, heart disease, irregular heart beat -certain medicines for depression, anxiety, or psychotic disturbances -certain medicines for migraine headache like almotriptan, eletriptan, frovatriptan, naratriptan, rizatriptan, sumatriptan, zolmitriptan -certain medicines for seizures like carbamazepine and phenytoin -certain medicines for sleep -certain medicines that treat or prevent blood clots like dalteparin, enoxaparin, warfarin -digoxin -fentanyl -lithium -NSAIDS, medicines for pain and inflammation, like ibuprofen or naproxen -other medicines that prolong the QT interval (cause an abnormal heart rhythm) -rasagiline -supplements like St. John's wort, kava kava,  valerian -tramadol -tryptophan This list may not describe all possible interactions. Give your health care provider a list of all the medicines, herbs, non-prescription  drugs, or dietary supplements you use. Also tell them if you smoke, drink alcohol, or use illegal drugs. Some items may interact with your medicine. What should I watch for while using this medicine? Tell your doctor if your symptoms do not get better or if they get worse. Visit your doctor or health care professional for regular checks on your progress. Because it may take several weeks to see the full effects of this medicine, it is important to continue your treatment as prescribed by your doctor. Patients and their families should watch out for new or worsening thoughts of suicide or depression. Also watch out for sudden changes in feelings such as feeling anxious, agitated, panicky, irritable, hostile, aggressive, impulsive, severely restless, overly excited and hyperactive, or not being able to sleep. If this happens, especially at the beginning of treatment or after a change in dose, call your health care professional. Dennis Bast may get drowsy or dizzy. Do not drive, use machinery, or do anything that needs mental alertness until you know how this medicine affects you. Do not stand or sit up quickly, especially if you are an older patient. This reduces the risk of dizzy or fainting spells. Alcohol may interfere with the effect of this medicine. Avoid alcoholic drinks. This medicine may cause dry eyes and blurred vision. If you wear contact lenses you may feel some discomfort. Lubricating drops may help. See your eye doctor if the problem does not go away or is severe. Your mouth may get dry. Chewing sugarless gum, sucking hard candy and drinking plenty of water may help. Contact your doctor if the problem does not go away or is severe. What side effects may I notice from receiving this medicine? Side effects that you should report to your doctor or health care professional as soon as possible: -allergic reactions like skin rash, itching or hives, swelling of the face, lips, or tongue -elevated mood,  decreased need for sleep, racing thoughts, impulsive behavior -confusion -fast, irregular heartbeat -feeling faint or lightheaded, falls -feeling agitated, angry, or irritable -loss of balance or coordination -painful or prolonged erections -restlessness, pacing, inability to keep still -suicidal thoughts or other mood changes -tremors -trouble sleeping -seizures -unusual bleeding or bruising Side effects that usually do not require medical attention (report to your doctor or health care professional if they continue or are bothersome): -change in sex drive or performance -change in appetite or weight -constipation -headache -muscle aches or pains -nausea This list may not describe all possible side effects. Call your doctor for medical advice about side effects. You may report side effects to FDA at 1-800-FDA-1088. Where should I keep my medicine? Keep out of the reach of children. Store at room temperature between 15 and 30 degrees C (59 to 86 degrees F). Protect from light. Keep container tightly closed. Throw away any unused medicine after the expiration date. NOTE: This sheet is a summary. It may not cover all possible information. If you have questions about this medicine, talk to your doctor, pharmacist, or health care provider.  2018 Elsevier/Gold Standard (2015-07-13 16:57:05)

## 2017-12-31 NOTE — Progress Notes (Signed)
[facility] MD OP Progress Note  12/31/2017 2:21 PM Stacy [name]  MRN:  [id number]  Chief Complaint: ' I am here for follow up." Chief Complaint    Follow-up; Medication Refill; Anxiety; Depression; Insomnia     HPI: [name] is a 57 year old female, Caucasian, unemployed, married, lives in [place], has a history of mild cognitive disorder, Sjogren's syndrome, hypertension, arthritis, hypothyroidism, migraine, presented to the clinic today for a follow-up visit.  Patient reports she is tolerating the Latuda well.  She reports the [place] helps to take the edge off.  She however continues to struggle with anxiety symptoms, racing thoughts as well as sleep problems.  Patient continues to be on Paxil 40 mg which was started 4-6 months ago.  Patient reports she does not know if the Paxil is effective since she continues to struggle with anxiety symptoms.    Patient has a history of brain cyst which could be contributing to some cognitive problems.  She is currently on Aricept.  She denies any suicidality, perceptual disturbances.  Discussed medication readjustment with patient.  Discussed increasing her Latuda as well as adding trazodone for sleep.  She agrees with plan.  She denies any side effects to the Latuda at this time.  Aims equals 0   Visit Diagnosis:    ICD-10-CM   1. Bipolar 2 disorder (HCC) F31.81 lurasidone (LATUDA) 40 MG TABS tablet    DISCONTINUED: traZODone (DESYREL) 50 MG tablet   most recent depressed with mixed features  2. Mild cognitive disorder F09     Past Psychiatric History: Have reviewed past psychiatric history from my progress note on 12/10/2017.  Past trials of Paxil.  Past Medical History:  Past Medical History:  Diagnosis Date  . Aneurysm ([place])   . Anxiety   . Arthritis   . Brain stem lesion   . Chronic kidney disease    kideny stone  . Depression   . Fibromyalgia   . GERD (gastroesophageal reflux disease)   . Headache   . Hypotension   .  Hypothyroid   . Memory loss   . Migraine   . Raynaud disease   . Sjogren's syndrome ([place])   . Thyroid disease     Past Surgical History:  Procedure Laterality Date  . ABDOMINAL HYSTERECTOMY    . BASAL CELL CARCINOMA EXCISION    . BREAST BIOPSY    . CESAREAN SECTION    . EYE SURGERY    . KNEE SURGERY Left   . LITHOTRIPSY    . MUSCLE BIOPSY    . OOPHORECTOMY      Family Psychiatric History: Reviewed family psychiatric history from my progress note on 12/10/2017  Family History:  Family History  Problem Relation Age of Onset  . Migraines Mother   . Anxiety disorder Mother   . Depression Mother   . Cancer Father   . Hypertension Father   . Stroke Father   . Heart attack Father   . Alcohol abuse Father   . Diabetes Brother   . Alcohol abuse Sister   . Drug abuse Sister   . Anxiety disorder Sister   . Depression Sister   . Drug abuse Brother   . Cancer Paternal Aunt   . Lupus Paternal Aunt   . Thyroid disease Paternal Aunt   . Diabetes Maternal Grandfather   . Anxiety disorder Maternal Grandmother   . Depression Maternal Grandmother     Social History: Reviewed social history from my progress note on 12/10/2017. Social History  Socioeconomic History  . Marital status: Married    Spouse name: Stacy [name]  . Number of children: 2  . Years of education: 57  . Highest education level: Associate degree: occupational, Hotel manager, or vocational program  Occupational History  . Occupation: Disabled  Social Needs  . Financial resource strain: Not hard at all  . Food insecurity:    Worry: Never true    Inability: Never true  . Transportation needs:    Medical: No    Non-medical: No  Tobacco Use  . Smoking status: Never Smoker  . Smokeless tobacco: Never Used  Substance and Sexual Activity  . Alcohol use: No    Alcohol/week: 0.0 standard drinks  . Drug use: Never  . Sexual activity: Yes    Birth control/protection: None  Lifestyle  . Physical activity:    Days  per week: 7 days    Minutes per session: 30 min  . Stress: Very much  Relationships  . Social connections:    Talks on phone: Not on file    Gets together: Not on file    Attends religious service: More than 4 times per year    Active member of club or organization: No    Attends meetings of clubs or organizations: Never    Relationship status: Married  Other Topics Concern  . Not on file  Social History Narrative   Lives at home with husband. Stacy [name]).    Disabled.   Education college.   Right handed.   2 cups caffeine/daily    Allergies:  Allergies  Allergen Reactions  . Amoxicillin Hives  . Penicillins Hives    Metabolic Disorder Labs: Lab Results  Component Value Date   HGBA1C 5.0 11/13/2015   No results found for: PROLACTIN No results found for: CHOL, TRIG, HDL, CHOLHDL, VLDL, LDLCALC Lab Results  Component Value Date   TSH 1.780 10/26/2015   TSH 0.525 03/15/2015    Therapeutic Level Labs: No results found for: LITHIUM No results found for: VALPROATE No components found for:  CBMZ  Current Medications: Current Outpatient Medications  Medication Sig Dispense Refill  . Calcium-Vitamin D 600-200 MG-UNIT per tablet Take by mouth.    . Cholecalciferol (VITAMIN D-3) 1000 units CAPS Take by mouth daily.    . Cholecalciferol 1000 UNITS tablet Take by mouth.    . Cranberry-Vitamin C-Vitamin E (CRANBERRY/VITAMIN C TRIPLE ST PO) Take by mouth.    . fludrocortisone (FLORINEF) 0.1 MG tablet Take 2 tablets by mouth two times daily 360 tablet 2  . gabapentin (NEURONTIN) 300 MG capsule Take 1 capsule by mouth two times daily 180 capsule 2  . levothyroxine (SYNTHROID, LEVOTHROID) 175 MCG tablet Take 1 tablet (175 mcg total) by mouth daily before breakfast. 90 tablet 3  . lurasidone (LATUDA) 40 MG TABS tablet Take 0.5-1 tablets (20-40 mg total) by mouth 2 (two) times daily with a meal. Take half tablet with breakfast and 1 tablet with supper 45 tablet 0  . omeprazole  (PRILOSEC) 20 MG capsule Take 1 capsule (20 mg total) by mouth daily. 180 capsule 3  . PARoxetine (PAXIL) 40 MG tablet Take 1 tablet (40 mg total) by mouth every morning. 90 tablet 0  . vitamin E 400 UNIT capsule Take 400 Units by mouth daily.    Marland Kitchen donepezil (ARICEPT) 10 MG tablet Take 1 tablet (10 mg total) by mouth at bedtime. 90 tablet 4  . hydrOXYzine (VISTARIL) 25 MG capsule Take 1 capsule (25 mg total) by mouth daily as  needed (for severe anxiety). Anxiety/agitation 90 capsule 0  . traZODone (DESYREL) 50 MG tablet TAKE 1 TABLET(50 MG) BY MOUTH AT BEDTIME FOR SLEEP 90 tablet 1   No current facility-administered medications for this visit.      Musculoskeletal: Strength & Muscle Tone: within normal limits Gait & Station: normal Patient leans: N/A  Psychiatric Specialty Exam: Review of Systems  Psychiatric/Behavioral: Positive for depression. The patient is nervous/anxious and has insomnia.   All other systems reviewed and are negative.   Blood pressure 136/84, pulse 67, temperature 98.4 F (36.9 C), temperature source Oral, weight 155 lb 9.6 oz (70.6 kg).Body mass index is 24.37 kg/m.  General Appearance: Casual  Eye Contact:  Fair  Speech:  Clear and Coherent  Volume:  Normal  Mood:  Anxious and Dysphoric  Affect:  Tearful  Thought Process:  Goal Directed and Descriptions of Associations: Intact  Orientation:  Full (Time, Place, and Person)  Thought Content: Logical   Suicidal Thoughts:  No  Homicidal Thoughts:  No  Memory:  Immediate;   Fair Recent;   Fair Remote;   Fair  Judgement:  Fair  Insight:  Fair  Psychomotor Activity:  Normal  Concentration:  Concentration: Fair and Attention Span: Fair  Recall:  [employer] of Knowledge: Fair  Language: Fair  Akathisia:  No  Handed:  Right  AIMS (if indicated): 0  Assets:  Communication Skills Desire for Improvement Social Support  ADL's:  Intact  Cognition: WNL  Sleep:  Poor   Screenings: Mini-Mental     Office  Visit from 01/01/2016 in [facility] Office Visit from 11/13/2015 in [facility] Office Visit from 10/26/2015 in [facility] Office Visit from 04/25/2015 in [facility] Office Visit from 10/25/2014 in [facility]  Total Score (max 30 points )  25  26  21  23  29     PHQ2-9     Office Visit from 02/08/2015 in [facility] Office Visit from 10/07/2014 in [facility]  PHQ-2 Total Score  0  4  PHQ-9 Total Score  -  8       Assessment and Plan: Lanesha is a 57 year old Caucasian female who has a history of mood lability, anxiety symptoms,, cognitive disorder, Sjogren's syndrome, hypothyroidism, hypertension, arthritis, presented to the clinic today for a follow-up visit.  Patient is biologically predisposed given her family history of mental health problems.  She also has cognitive changes.  She has good social support system from her family.  Patient will continue to drink medication changes as noted below.  Plan Bipolar disorder type II Continue Paxil 40 mg p.o. Daily. Increase Latuda to 60 mg p.o. daily in divided dosage.  Discussed with patient to take it with meals.   Aims equals 0  For insomnia Start trazodone 50 mg p.o. nightly.  For mild cognitive disorder She will continue to follow-up with her neurologist.  Patient will continue psychotherapy sessions without therapist here.  Follow-up in clinic in 2-3 weeks or sooner if needed.  More than 50 % of the time was spent for psychoeducation and supportive psychotherapy and care coordination.  This note was generated in part or whole with voice recognition software. Voice recognition is usually quite accurate but there are transcription errors that can and very often do occur. I apologize for any typographical errors that were not detected and corrected.        Ursula Alert, MD 01/01/2018, 8:24 AM

## 2017-12-31 NOTE — Telephone Encounter (Signed)
received fax that a pa is needed for latuda 40mg 

## 2017-12-31 NOTE — Telephone Encounter (Signed)
prior Josem Kaufmann was enter- now pending review.

## 2018-01-01 ENCOUNTER — Encounter: Payer: Self-pay | Admitting: Psychiatry

## 2018-01-01 NOTE — Telephone Encounter (Signed)
received a denied notice for the latuda.

## 2018-01-01 NOTE — Telephone Encounter (Signed)
CAN WE GIVE SOME SAMPLES . Please call her and let her know samples are available

## 2018-01-05 ENCOUNTER — Telehealth: Payer: Self-pay | Admitting: Psychiatry

## 2018-01-05 MED ORDER — LURASIDONE HCL 60 MG PO TABS: 60.0000 mg | ORAL_TABLET | Freq: Every day | ORAL | 2 refills | Status: DC

## 2018-01-06 NOTE — Telephone Encounter (Signed)
Pt was given sample today. She stated she was going to the pharmacy to get rx and she was told if she has any issues to call office .

## 2018-01-06 NOTE — Telephone Encounter (Signed)
Discussed with Janett Billow CMA to call pt to provide her with samples.

## 2018-01-08 ENCOUNTER — Ambulatory Visit: Payer: Medicare Other | Admitting: Licensed Clinical Social Worker

## 2018-01-13 ENCOUNTER — Telehealth: Payer: Self-pay

## 2018-01-13 NOTE — Telephone Encounter (Signed)
appeal was approved for the latuda 40mg   for 12 months  01-11-2019

## 2018-01-19 ENCOUNTER — Encounter: Payer: Self-pay | Admitting: Licensed Clinical Social Worker

## 2018-01-19 ENCOUNTER — Ambulatory Visit (INDEPENDENT_AMBULATORY_CARE_PROVIDER_SITE_OTHER): Payer: 59 | Admitting: Licensed Clinical Social Worker

## 2018-01-19 DIAGNOSIS — F3181 Bipolar II disorder: Secondary | ICD-10-CM | POA: Diagnosis not present

## 2018-01-19 NOTE — Progress Notes (Signed)
   THERAPIST PROGRESS NOTE  Session Time: 1100  Participation Level: Minimal  Behavioral Response: CasualAlertAnxious  Type of Therapy: Individual Therapy  Treatment Goals addressed: Coping  Interventions: Supportive  Summary: Stacy [name] is a 57 y.o. female who presents with symptoms related to her diagnosis. Stacy [name] reports things have been going much better, especially since MD increased her medications. Stacy [name] reported she has still only discussed her diagnosis with her sister and husband, but reports feeling much more comfortable with it--and is able to recognize she is not her mother. Stacy [name] disclosed that she has been hearing a voice, and having conversations with someone in her head. She reports this has been going on for the last few years, and added she also had imaginary friends as a child. She reported the voice is not malicious and is just like having a friend. We discussed the voice could be a symptom of her diagnosis, or could be a learned coping mechanism resulting from instability during childhood. She reported not having many close friends because, "as a child, people I thought were my friends would pick on me when they wanted to, so I've just always kept people at an arm's distance." Stacy [name] reported the voice does not distress her, but she wanted to mention it. LCSW encouraged Stacy [name] to notice when the voice is present versus when it is not. Stacy [name] expressed understanding and agreement with this idea. Stacy [name] reported other than the voice, everything is going much better and she had "nothing else to report."    Suicidal/Homicidal: No  Therapist Response: Stacy [name] presented with an anxious affect, but was able to speak openly with LCSW to discuss her symptoms. She reports feeling tired due to medication changes, but reports doing well emotionally. We will continue to utilize supportive therapy in order to manage anxiety and depression symptoms.   Plan: Return  again in 8 weeks.  Diagnosis: Axis I: Bipolar 2 Disorder     Axis II: No diagnosis    Alden Hipp, LCSW 01/19/2018

## 2018-01-21 ENCOUNTER — Ambulatory Visit (INDEPENDENT_AMBULATORY_CARE_PROVIDER_SITE_OTHER): Payer: 59 | Admitting: Psychiatry

## 2018-01-21 ENCOUNTER — Encounter: Payer: Self-pay | Admitting: Psychiatry

## 2018-01-21 VITALS — BP 105/70 | HR 71 | Temp 98.9°F | Wt 155.8 lb

## 2018-01-21 DIAGNOSIS — F3181 Bipolar II disorder: Secondary | ICD-10-CM

## 2018-01-21 DIAGNOSIS — F09 Unspecified mental disorder due to known physiological condition: Secondary | ICD-10-CM | POA: Diagnosis not present

## 2018-01-21 NOTE — Progress Notes (Signed)
[place] MD OP Progress Note  01/21/2018 2:38 PM Stacy [name]  MRN:  [id number]  Chief Complaint: ' I am here for follow up." Chief Complaint    Follow-up; Medication Refill     HPI: Kanna is a 57 year old Caucasian female, unemployed, married, lives in [place], has a history of mild cognitive disorder, bipolar disorder type II, hypertension, Sjogren's syndrome, arthritis, hypothyroidism, migraine, presented to the clinic today for a follow-up visit.  Patient today reports she is taking the Latuda higher dosage in divided doses.  Patient reports she has noticed the Latuda as very helpful.  Her mood symptoms are more stable now.  She does not feel extremely anxious or agitated anymore.  She does report some grogginess and hence discussed with her to take it with supper in the evening to see if that will help.  She reports sleep is good.  She denies any racing thoughts.  Patient continues to be compliant with Paxil and denies any side effects to it.  She has a history of brain cyst which could be contributing to some cognitive problems and she continues to be compliant on Aricept.  She has started psychotherapy sessions with Ms. Alden Hipp which is going on well.  Patient looks forward to Thanksgiving holiday and plans to spend it with family. Visit Diagnosis:    ICD-10-CM   1. Bipolar 2 disorder (HCC) F31.81    most recent mixed , mild  2. Mild cognitive disorder F09     Past Psychiatric History: I have reviewed past psychiatric history from my progress note on 12/10/2017.  Past trials of Paxil  Past Medical History:  Past Medical History:  Diagnosis Date  . Aneurysm ([place])   . Anxiety   . Arthritis   . Brain stem lesion   . Chronic kidney disease    kideny stone  . Depression   . Fibromyalgia   . GERD (gastroesophageal reflux disease)   . Headache   . Hypotension   . Hypothyroid   . Memory loss   . Migraine   . Raynaud disease   . Sjogren's syndrome ([place])   .  Thyroid disease     Past Surgical History:  Procedure Laterality Date  . ABDOMINAL HYSTERECTOMY    . BASAL CELL CARCINOMA EXCISION    . BREAST BIOPSY    . CESAREAN SECTION    . EYE SURGERY    . KNEE SURGERY Left   . LITHOTRIPSY    . MUSCLE BIOPSY    . OOPHORECTOMY      Family Psychiatric History: I have reviewed family psychiatric history from my progress note on 12/10/2017  Family History:  Family History  Problem Relation Age of Onset  . Migraines Mother   . Anxiety disorder Mother   . Depression Mother   . Cancer Father   . Hypertension Father   . Stroke Father   . Heart attack Father   . Alcohol abuse Father   . Diabetes Brother   . Alcohol abuse Sister   . Drug abuse Sister   . Anxiety disorder Sister   . Depression Sister   . Drug abuse Brother   . Cancer Paternal Aunt   . Lupus Paternal Aunt   . Thyroid disease Paternal Aunt   . Diabetes Maternal Grandfather   . Anxiety disorder Maternal Grandmother   . Depression Maternal Grandmother     Social History: Reviewed social history from my progress note on 12/10/2017 Social History   Socioeconomic History  . Marital  status: Married    Spouse name: [name]  . Number of children: 2  . Years of education: 75  . Highest education level: Associate degree: occupational, Hotel manager, or vocational program  Occupational History  . Occupation: Disabled  Social Needs  . Financial resource strain: Not hard at all  . Food insecurity:    Worry: Never true    Inability: Never true  . Transportation needs:    Medical: No    Non-medical: No  Tobacco Use  . Smoking status: Never Smoker  . Smokeless tobacco: Never Used  Substance and Sexual Activity  . Alcohol use: No    Alcohol/week: 0.0 standard drinks  . Drug use: Never  . Sexual activity: Yes    Birth control/protection: None  Lifestyle  . Physical activity:    Days per week: 7 days    Minutes per session: 30 min  . Stress: Very much  Relationships  .  Social connections:    Talks on phone: Not on file    Gets together: Not on file    Attends religious service: More than 4 times per year    Active member of club or organization: No    Attends meetings of clubs or organizations: Never    Relationship status: Married  Other Topics Concern  . Not on file  Social History Narrative   Lives at home with husband. [name]).    Disabled.   Education college.   Right handed.   2 cups caffeine/daily    Allergies:  Allergies  Allergen Reactions  . Amoxicillin Hives  . Penicillins Hives    Metabolic Disorder Labs: Lab Results  Component Value Date   HGBA1C 5.0 11/13/2015   No results found for: PROLACTIN No results found for: CHOL, TRIG, HDL, CHOLHDL, VLDL, LDLCALC Lab Results  Component Value Date   TSH 1.780 10/26/2015   TSH 0.525 03/15/2015    Therapeutic Level Labs: No results found for: LITHIUM No results found for: VALPROATE No components found for:  CBMZ  Current Medications: Current Outpatient Medications  Medication Sig Dispense Refill  . alendronate (FOSAMAX) 70 MG tablet     . Calcium-Vitamin D 600-200 MG-UNIT per tablet Take by mouth.    . Cholecalciferol (VITAMIN D-3) 1000 units CAPS Take by mouth daily.    . Cholecalciferol 1000 UNITS tablet Take by mouth.    . Cranberry-Vitamin C-Vitamin E (CRANBERRY/VITAMIN C TRIPLE ST PO) Take by mouth.    . fludrocortisone (FLORINEF) 0.1 MG tablet Take 2 tablets by mouth two times daily 360 tablet 2  . gabapentin (NEURONTIN) 300 MG capsule Take 1 capsule by mouth two times daily 180 capsule 2  . hydrOXYzine (VISTARIL) 25 MG capsule Take 1 capsule (25 mg total) by mouth daily as needed (for severe anxiety). Anxiety/agitation 90 capsule 0  . levothyroxine (SYNTHROID, LEVOTHROID) 175 MCG tablet Take 1 tablet (175 mcg total) by mouth daily before breakfast. 90 tablet 3  . Lurasidone HCl 60 MG TABS Take 1 tablet (60 mg total) by mouth daily with breakfast. 30 tablet 2  .  omeprazole (PRILOSEC) 20 MG capsule Take 1 capsule (20 mg total) by mouth daily. 180 capsule 3  . PARoxetine (PAXIL) 40 MG tablet Take 1 tablet (40 mg total) by mouth every morning. 90 tablet 0  . traZODone (DESYREL) 50 MG tablet TAKE 1 TABLET(50 MG) BY MOUTH AT BEDTIME FOR SLEEP 90 tablet 1  . vitamin E 400 UNIT capsule Take 400 Units by mouth daily.    Marland Kitchen  donepezil (ARICEPT) 10 MG tablet Take 1 tablet (10 mg total) by mouth at bedtime. 90 tablet 4   No current facility-administered medications for this visit.      Musculoskeletal: Strength & Muscle Tone: within normal limits Gait & Station: normal Patient leans: N/A  Psychiatric Specialty Exam: Review of Systems  Psychiatric/Behavioral: Positive for depression (improved). The patient is nervous/anxious (improved).   All other systems reviewed and are negative.   Blood pressure 105/70, pulse 71, temperature 98.9 F (37.2 C), temperature source Oral, weight 155 lb 12.8 oz (70.7 kg).Body mass index is 24.4 kg/m.  General Appearance: Casual  Eye Contact:  Fair  Speech:  Clear and Coherent  Volume:  Normal  Mood:  Anxious  Affect:  Congruent  Thought Process:  Goal Directed and Descriptions of Associations: Intact  Orientation:  Full (Time, Place, and Person)  Thought Content: Logical   Suicidal Thoughts:  No  Homicidal Thoughts:  No  Memory:  Immediate;   Fair Recent;   Fair Remote;   Fair  Judgement:  Fair  Insight:  Fair  Psychomotor Activity:  Normal  Concentration:  Concentration: Fair and Attention Span: Fair  Recall:  [employer] of Knowledge: Fair  Language: Fair  Akathisia:  No  Handed:  Right  AIMS (if indicated): 0  Assets:  Communication Skills Desire for Improvement Social Support  ADL's:  Intact  Cognition: WNL  Sleep:  Fair   Screenings: Mini-Mental     Office Visit from 01/01/2016 in [facility] Office Visit from 11/13/2015 in [facility] Office Visit from  10/26/2015 in [facility] Office Visit from 04/25/2015 in [facility] Office Visit from 10/25/2014 in [facility]  Total Score (max 30 points )  25  26  21  23  29     PHQ2-9     Office Visit from 02/08/2015 in [facility] Office Visit from 10/07/2014 in [facility]  PHQ-2 Total Score  0  4  PHQ-9 Total Score  -  8       Assessment and Plan: Stacy [name] is a 57 year old Caucasian female who has a history of mood lability, anxiety, cognitive disorder, Sjogren's syndrome, hypothyroidism, hypertension, arthritis, presented to the clinic today for a follow-up visit.  Patient is biologically predisposed given her family history of mental health problems.  Patient also has cognitive changes due to a history of brain cyst.  Patient is currently making progress on the current medication.  Will continue plan as noted below.  Plan Bipolar disorder type II Paxil 40 mg p.o. daily Continue Latuda 60 mg p.o. daily. Aims equals 0  For insomnia Trazodone 50 mg p.o. Nightly  Mild cognitive disorder She will continue to follow-up with her neurologist.  She will continue psychotherapy sessions.  Follow-up in clinic in 2 months or sooner if needed.  More than 50 % of the time was spent for psychoeducation and supportive psychotherapy and care coordination.  This note was generated in part or whole with voice recognition software. Voice recognition is usually quite accurate but there are transcription errors that can and very often do occur. I apologize for any typographical errors that were not detected and corrected.         Ursula Alert, MD 01/21/2018, 2:38 PM

## 2018-03-03 ENCOUNTER — Other Ambulatory Visit: Payer: Self-pay | Admitting: Psychiatry

## 2018-03-16 ENCOUNTER — Ambulatory Visit: Payer: Medicare Other | Admitting: Licensed Clinical Social Worker

## 2018-03-23 ENCOUNTER — Encounter: Payer: Self-pay | Admitting: Psychiatry

## 2018-03-23 ENCOUNTER — Ambulatory Visit (INDEPENDENT_AMBULATORY_CARE_PROVIDER_SITE_OTHER): Payer: 59 | Admitting: Psychiatry

## 2018-03-23 ENCOUNTER — Other Ambulatory Visit: Payer: Self-pay

## 2018-03-23 ENCOUNTER — Other Ambulatory Visit: Payer: Self-pay | Admitting: Psychiatry

## 2018-03-23 VITALS — BP 129/75 | HR 77 | Temp 98.1°F | Wt 161.4 lb

## 2018-03-23 DIAGNOSIS — F5105 Insomnia due to other mental disorder: Secondary | ICD-10-CM | POA: Diagnosis not present

## 2018-03-23 DIAGNOSIS — G2111 Neuroleptic induced parkinsonism: Secondary | ICD-10-CM

## 2018-03-23 DIAGNOSIS — F3181 Bipolar II disorder: Secondary | ICD-10-CM

## 2018-03-23 DIAGNOSIS — F09 Unspecified mental disorder due to known physiological condition: Secondary | ICD-10-CM | POA: Diagnosis not present

## 2018-03-23 MED ORDER — HYDROXYZINE PAMOATE 25 MG PO CAPS: 25.0000 mg | ORAL_CAPSULE | Freq: Every day | ORAL | 0 refills | Status: DC | PRN

## 2018-03-23 MED ORDER — BENZTROPINE MESYLATE 0.5 MG PO TABS: 0.5000 mg | ORAL_TABLET | Freq: Two times a day (BID) | ORAL | 1 refills | Status: DC

## 2018-03-23 MED ORDER — LURASIDONE HCL 60 MG PO TABS: 60.0000 mg | ORAL_TABLET | Freq: Every day | ORAL | 0 refills | Status: DC

## 2018-03-23 NOTE — Progress Notes (Signed)
[facility] MD OP Progress Note  03/23/2018 1:00 PM Stacy [name]  MRN:  [id number]  Chief Complaint: ' I am here for follow up.' Chief Complaint    Follow-up; Medication Refill     HPI: Stacy [name] is a 58 year old Caucasian female, unemployed, married, lives in [place], has a history of mild cognitive disorder, bipolar disorder type II, hypertension, Sjogren's syndrome, arthritis, hypothyroidism, migraine, presented to the clinic today for a follow-up visit.  Patient today reports she continues to have 1-2 episodes of being frustrated and being irritable every week.  Patient reports her mood lability however has improved on the Latuda and Paxil.  Patient reports she missed her appointment with her therapist Ms. Alden Hipp last week.  She reports she would like to continue psychotherapy sessions.  Patient also reports some movements around her mouth and movements of her teeth.  Discussed with her it could be a side effect of her Latuda.  We will try Cogentin.  Patient does not want her medication dosage to be reduced or discontinued at this time.  She wants to give it more time and try the Cogentin for now.  Discussed with patient to reach out to writer in 10 days if her symptoms does not get better.  Also discussed the possibility of tardive dyskinesia.  Patient reports sleep is good.  Patient reports her husband continues to be supportive.  Patient continues to follow-up with neurology for her cognitive problems.   Visit Diagnosis:    ICD-10-CM   1. 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 Insomnia due to mental condition F51.05   4. Mild cognitive disorder F09     Past Psychiatric History: Reviewed past psychiatric history from my progress note on 12/10/2017.  Past trials of Paxil.  Past Medical History:  Past Medical  History:  Diagnosis Date  . Aneurysm ([place])   . Anxiety   . Arthritis   . Brain stem lesion   . Chronic kidney disease    kideny stone  . Depression   . Fibromyalgia   . GERD (gastroesophageal reflux disease)   . Headache   . Hypotension   . Hypothyroid   . Memory loss   . Migraine   . Raynaud disease   . Sjogren's syndrome ([place])   . Thyroid disease     Past Surgical History:  Procedure Laterality Date  . ABDOMINAL HYSTERECTOMY    . BASAL CELL CARCINOMA EXCISION    . BREAST BIOPSY    . CESAREAN SECTION    . EYE SURGERY    . KNEE SURGERY Left   . LITHOTRIPSY    . MUSCLE BIOPSY    . OOPHORECTOMY      Family Psychiatric History: I have reviewed family psychiatric history from my progress note on 12/10/2017.  Family History:  Family History  Problem Relation Age of Onset  . Migraines Mother   . Anxiety disorder Mother   . Depression Mother   . Cancer Father   . Hypertension Father   . Stroke Father   . Heart attack Father   . Alcohol abuse Father   . Diabetes Brother   . Alcohol abuse Sister   . Drug abuse Sister   . Anxiety disorder Sister   . Depression Sister   . Drug abuse Brother   . Cancer Paternal Aunt   . Lupus Paternal  Aunt   . Thyroid disease Paternal Aunt   . Diabetes Maternal Grandfather   . Anxiety disorder Maternal Grandmother   . Depression Maternal Grandmother     Social History: I have reviewed social history from my progress note on 12/10/2017. Social History   Socioeconomic History  . Marital status: Married    Spouse name: [name]  . Number of children: 2  . Years of education: 7  . Highest education level: Associate degree: occupational, Hotel manager, or vocational program  Occupational History  . Occupation: Disabled  Social Needs  . Financial resource strain: Not hard at all  . Food insecurity:    Worry: Never true    Inability: Never true  . Transportation needs:    Medical: No    Non-medical: No  Tobacco Use  . Smoking  status: Never Smoker  . Smokeless tobacco: Never Used  Substance and Sexual Activity  . Alcohol use: No    Alcohol/week: 0.0 standard drinks  . Drug use: Never  . Sexual activity: Yes    Birth control/protection: None  Lifestyle  . Physical activity:    Days per week: 7 days    Minutes per session: 30 min  . Stress: Very much  Relationships  . Social connections:    Talks on phone: Not on file    Gets together: Not on file    Attends religious service: More than 4 times per year    Active member of club or organization: No    Attends meetings of clubs or organizations: Never    Relationship status: Married  Other Topics Concern  . Not on file  Social History Narrative   Lives at home with husband. [name]).    Disabled.   Education college.   Right handed.   2 cups caffeine/daily    Allergies:  Allergies  Allergen Reactions  . Amoxicillin Hives  . Penicillins Hives    Metabolic Disorder Labs: Lab Results  Component Value Date   HGBA1C 5.0 11/13/2015   No results found for: PROLACTIN No results found for: CHOL, TRIG, HDL, CHOLHDL, VLDL, LDLCALC Lab Results  Component Value Date   TSH 1.780 10/26/2015   TSH 0.525 03/15/2015    Therapeutic Level Labs: No results found for: LITHIUM No results found for: VALPROATE No components found for:  CBMZ  Current Medications: Current Outpatient Medications  Medication Sig Dispense Refill  . alendronate (FOSAMAX) 70 MG tablet     . Calcium-Vitamin D 600-200 MG-UNIT per tablet Take by mouth.    . Cholecalciferol (VITAMIN D-3) 1000 units CAPS Take by mouth daily.    . Cholecalciferol 1000 UNITS tablet Take by mouth.    . Cranberry-Vitamin C-Vitamin E (CRANBERRY/VITAMIN C TRIPLE ST PO) Take by mouth.    . fludrocortisone (FLORINEF) 0.1 MG tablet Take 2 tablets by mouth two times daily 360 tablet 2  . gabapentin (NEURONTIN) 300 MG capsule Take 1 capsule by mouth two times daily 180 capsule 2  . hydrOXYzine (VISTARIL) 25  MG capsule Take 1 capsule (25 mg total) by mouth daily as needed (for severe anxiety). Anxiety/agitation 90 capsule 0  . levothyroxine (SYNTHROID, LEVOTHROID) 175 MCG tablet Take 1 tablet (175 mcg total) by mouth daily before breakfast. 90 tablet 3  . Lurasidone HCl 60 MG TABS Take 1 tablet (60 mg total) by mouth daily with breakfast. 90 tablet 0  . omeprazole (PRILOSEC) 20 MG capsule Take 1 capsule (20 mg total) by mouth daily. 180 capsule 3  . PARoxetine (PAXIL)  40 MG tablet TAKE 1 TABLET BY MOUTH  EVERY MORNING 90 tablet 0  . traZODone (DESYREL) 50 MG tablet TAKE 1 TABLET(50 MG) BY MOUTH AT BEDTIME FOR SLEEP 90 tablet 1  . vitamin E 400 UNIT capsule Take 400 Units by mouth daily.    . benztropine (COGENTIN) 0.5 MG tablet TAKE 1 TABLET BY MOUTH DAILY FOR 2 WEEKS, THEN INCREASE TO 1 TABLET TWICE DAILY. 180 tablet 0  . donepezil (ARICEPT) 10 MG tablet Take 1 tablet (10 mg total) by mouth at bedtime. 90 tablet 4   No current facility-administered medications for this visit.      Musculoskeletal: Strength & Muscle Tone: within normal limits Gait & Station: normal Patient leans: N/A  Psychiatric Specialty Exam: Review of Systems  Psychiatric/Behavioral: The patient is nervous/anxious.   All other systems reviewed and are negative.   Blood pressure 129/75, pulse 77, temperature 98.1 F (36.7 C), temperature source Oral, weight 161 lb 6.4 oz (73.2 kg).Body mass index is 25.28 kg/m.  General Appearance: Casual  Eye Contact:  Fair  Speech:  Clear and Coherent  Volume:  Normal  Mood:  Anxious  Affect:  Appropriate  Thought Process:  Goal Directed and Descriptions of Associations: Intact  Orientation:  Full (Time, Place, and Person)  Thought Content: Logical   Suicidal Thoughts:  No  Homicidal Thoughts:  No  Memory:  Immediate;   Fair Recent;   Fair Remote;   Fair  Judgement:  Fair  Insight:  Fair  Psychomotor Activity:  Normal  Concentration:  Concentration: Fair and Attention  Span: Fair  Recall:  [employer] of Knowledge: Fair  Language: Fair  Akathisia:  No  Handed:  Right  AIMS (if indicated): has movements around her mouth and has movements of her teeth  Assets:  Communication Skills Desire for Improvement Social Support  ADL's:  Intact  Cognition: WNL  Sleep:  Fair   Screenings: Mini-Mental     Office Visit from 01/01/2016 in [facility] Office Visit from 11/13/2015 in [facility] Office Visit from 10/26/2015 in [facility] Office Visit from 04/25/2015 in [facility] Office Visit from 10/25/2014 in [facility]  Total Score (max 30 points )  25  26  21  23  29     PHQ2-9     Office Visit from 02/08/2015 in [facility] Office Visit from 10/07/2014 in [facility]  PHQ-2 Total Score  0  4  PHQ-9 Total Score  -  8       Assessment and Plan: Rosealie is a 58 year old Caucasian female who has a history of mood lability, anxiety, cognitive disorder, Sjogren's syndrome, hypothyroidism, hypertension, arthritis, presented to clinic today for a follow-up visit.  Patient continues to struggle with mood lability.  She also has some movement disorder, new onset likely secondary to her [place].  Discussed the following changes with patient.  Plan Bipolar disorder type II- improving Paxil 40 mg p.o. daily Latuda 60 mg p.o. daily Vistaril 25 mg p.o. daily PRN for severe anxiety attacks.  For insomnia- improving Trazodone 50 mg p.o. nightly.  For neuroleptic induced movement disorder Start Cogentin 0.5 mg p.o. daily for a week and increase to twice a day. Patient advised to call writer back in a week to 10 days if her symptoms does not get better. Discussed reducing the dosage of Latuda however she declines.  Mild cognitive disorder She will continue to follow-up with her neurologist.  Patient will continue psychotherapy visits with  Alden Hipp.  Follow-up in clinic in 4 weeks or sooner if needed.  This note was generated in part or whole with voice recognition software. Voice recognition is usually quite accurate but there are transcription errors that can and very often do occur. I apologize for any typographical errors that were not detected and corrected.        Ursula Alert, MD 03/23/2018, 1:00 PM

## 2018-03-23 NOTE — Patient Instructions (Signed)
Benztropine tablets What is this medicine? BENZTROPINE (BENZ troe peen) is for certain movement problems due to Parkinson's disease, certain medicines, or other causes. This medicine may be used for other purposes; ask your health care provider or pharmacist if you have questions. COMMON BRAND NAME(S): Cogentin What should I tell my health care provider before I take this medicine? They need to know if you have any of these conditions: -glaucoma -heart disease or a rapid heartbeat -mental problems -prostate trouble -tardive dyskinesia -an unusual or allergic reaction to benztropine, other medicines, lactose, foods, dyes, or preservatives -pregnant or trying to get pregnant -breast-feeding How should I use this medicine? Take this medicine by mouth with a full glass of water. Follow the directions on the prescription label. Take your medicine at regular intervals. Do not take your medicine more often than directed. Talk to your pediatrician regarding the use of this medicine in children. While this drug may be prescribed for children as young as 3 years for selected conditions, precautions do apply. Overdosage: If you think you have taken too much of this medicine contact a poison control center or emergency room at once. NOTE: This medicine is only for you. Do not share this medicine with others. What if I miss a dose? If you miss a dose, take it as soon as you can. If it is almost time for your next dose, take only that dose. Do not take double or extra doses. What may interact with this medicine? -haloperidol -medicines for movement abnormalities like Parkinson's disease -phenothiazines like chlorpromazine, mesoridazine, prochlorperazine, thioridazine -some antidepressants like amitriptyline, desipramine, doxepin, nortriptyline -stimulant medicines for attention, weight loss, and to stay awake -tegaserod This list may not describe all possible interactions. Give your health care  provider a list of all the medicines, herbs, non-prescription drugs, or dietary supplements you use. Also tell them if you smoke, drink alcohol, or use illegal drugs. Some items may interact with your medicine. What should I watch for while using this medicine? Visit your doctor or health care professional for regular checks on your progress. You may get drowsy or dizzy. Do not drive, use machinery, or do anything that needs mental alertness until you know how this medicine affects you. Do not stand or sit up quickly, especially if you are an older patient. This reduces the risk of dizzy or fainting spells. Alcohol may interfere with the effect of this medicine. Avoid alcoholic drinks. Your mouth may get dry. Chewing sugarless gum or sucking hard candy, and drinking plenty of water may help. Contact your doctor if the problem does not go away or is severe. This medicine may cause dry eyes and blurred vision. If you wear contact lenses you may feel some discomfort. Lubricating drops may help. See your eye doctor if the problem does not go away or is severe. You may sweat less than usual while you are taking this medicine. As a result your body temperature could rise to a dangerous level. Be careful not to get overheated during exercise or in hot weather. You could get heat stroke. Avoid taking hot baths and using hot tubs and saunas. What side effects may I notice from receiving this medicine? Side effects that you should report to your doctor or health care professional as soon as possible: -allergic reactions like skin rash, itching or hives, swelling of the face, lips, or tongue -changes in vision -confusion -decreased sweating or heat intolerance -depression -fast, irregular heartbeat -hallucinations -memory loss -muscle weakness -pain or difficulty  passing urine -vomiting Side effects that usually do not require medical attention (report to your doctor or health care professional if they  continue or are bothersome): -constipation -dry mouth -nausea This list may not describe all possible side effects. Call your doctor for medical advice about side effects. You may report side effects to FDA at 1-800-FDA-1088. Where should I keep my medicine? Keep out of the reach of children. Store below 30 degrees C (86 degrees F). Keep container tightly closed. Throw away any unused medicine after the expiration date. NOTE: This sheet is a summary. It may not cover all possible information. If you have questions about this medicine, talk to your doctor, pharmacist, or health care provider.  2019 Elsevier/Gold Standard (2007-05-13 15:38:20)

## 2018-04-02 ENCOUNTER — Encounter: Payer: Self-pay | Admitting: Licensed Clinical Social Worker

## 2018-04-02 ENCOUNTER — Ambulatory Visit (INDEPENDENT_AMBULATORY_CARE_PROVIDER_SITE_OTHER): Payer: 59 | Admitting: Licensed Clinical Social Worker

## 2018-04-02 DIAGNOSIS — F418 Other specified anxiety disorders: Secondary | ICD-10-CM | POA: Diagnosis not present

## 2018-04-02 DIAGNOSIS — F3341 Major depressive disorder, recurrent, in partial remission: Secondary | ICD-10-CM | POA: Diagnosis not present

## 2018-04-02 NOTE — Progress Notes (Signed)
   THERAPIST PROGRESS NOTE  Session Time: 4235-3614  Participation Level: Active  Behavioral Response: NeatAlertAnxious  Type of Therapy: Individual Therapy  Treatment Goals addressed: Coping  Interventions: CBT  Summary: Stacy [name] is a 58 y.o. female who presents with continued symptoms of her diagnosis. Stacy [name] reports an increase in agitation since our last session, and her psychiatrist in the clinic suggested she resume therapy session. Stacy [name] reports becoming agitated about things that normally she would not be upset about. She stated when she was attempting to put something into her GPS, she got so angry she was cussing at the GPS, "which isn't like me. I don't cuss." Stacy [name] also reported being upset when people are explaining things to her and she is not able to understand what they are saying. She stated, "it's like I'm frustrated that I'm not who I used to be." She added she is not able to cook anymore as she cannot focus on cooking. LCSW suggested attempting to understand what is at the root of her anger, as anger is a secondary emotion and is usually covering another, more vunerable emotion. Stacy [name] expressed agreement and understanding with this idea. LCSW also highlighted the parts that could stem to childhood. Stacy [name] reported previously feeling like her mother after being diagnosed with Bipolar. Because, growing up, she did not have a mother who cooked and cleaned, it is very likely she is now feeling guilty as she is not able to do those things anymore. Stacy [name] agreed with this assessment and reported "setting goals since I was a little kid to not be like my family." We discussed other ways she can stay calm in the moment such as: breathing exercises and distraction techniques. Stacy [name] reported feeling as though she was going to crawl out of her skin during our session today and reported feeling trapped. To highlight Stacy [name]'s sense of choice and freedom, LCSW suggested  we keep the visit short today and ease back into therapy sessions at a slower pace. Stacy [name] was in agreement with this plan.   Suicidal/Homicidal: No  Therapist Response: Stacy [name] presented on time for her scheduled appointment, and presented with an anxious, constricted affect. Stacy [name] was able to discuss her mental health symptoms with LCSW, but was not able to engage without being prompted. We will continue to work on CBT skills along with mindfulness skills to stay calm in the moment while managing anxiety and other symptoms of her diagnosis.   Plan: Return again in 2 weeks.  Diagnosis: Axis I: Bipolar 2 Disorder    Axis II: No diagnosis    Alden Hipp, LCSW 04/02/2018

## 2018-04-23 ENCOUNTER — Ambulatory Visit (INDEPENDENT_AMBULATORY_CARE_PROVIDER_SITE_OTHER): Payer: 59 | Admitting: Psychiatry

## 2018-04-23 ENCOUNTER — Other Ambulatory Visit: Payer: Self-pay

## 2018-04-23 ENCOUNTER — Encounter: Payer: Self-pay | Admitting: Psychiatry

## 2018-04-23 VITALS — BP 120/71 | HR 67 | Temp 97.7°F | Wt 163.2 lb

## 2018-04-23 DIAGNOSIS — F09 Unspecified mental disorder due to known physiological condition: Secondary | ICD-10-CM | POA: Diagnosis not present

## 2018-04-23 DIAGNOSIS — G2111 Neuroleptic induced parkinsonism: Secondary | ICD-10-CM

## 2018-04-23 DIAGNOSIS — F5105 Insomnia due to other mental disorder: Secondary | ICD-10-CM

## 2018-04-23 DIAGNOSIS — F3181 Bipolar II disorder: Secondary | ICD-10-CM | POA: Diagnosis not present

## 2018-04-23 MED ORDER — BUPROPION HCL 75 MG PO TABS: 75.0000 mg | ORAL_TABLET | Freq: Every day | ORAL | 1 refills | Status: DC

## 2018-04-23 NOTE — Progress Notes (Signed)
[facility] MD OP Progress Note  04/23/2018 1:00 PM Stacy [name]  MRN:  [id number]  Chief Complaint: ' I am here for follow up.' Chief Complaint    Follow-up; Medication Refill     HPI: Stacy [name] is a 58 year old Caucasian female, unemployed, married, lives in [place], has a history of mild cognitive disorder, bipolar disorder type II, hypertension, Sjogren's syndrome, arthritis, hypothyroidism, migraine headaches, presented to clinic today for a follow-up visit.  Patient reports she is currently doing well with regards to her mood symptoms.  She denies any significant mood lability.  She reports her anxiety symptoms is improved.  She continues to have the need for a routine in her life.  If that does not happen it makes her extremely anxious.  She continues to be compliant with her Latuda and Paxil as prescribed.  She did have abnormal involuntary movements around her mouth initially however that has subsided with the addition of Cogentin.  She denies any side effects to the Cogentin and is tolerating it well.  Patient reports that she continues to struggle with some cognitive issues.  She had neurology appointment.  She continues to be on donepezil .  I have reviewed medical records in [facility] per Dr. Manuella Ghazi-, dated 10/01/2017- ' patient with cognitive impairment?  Fibro-fog- continued Aricept-will consider tapering in future, decrease memantine to 5 mg 2 times a day for 1 week and then stop.  Patient had MRI brain on 09/10/2017 which showed no evidence of brain atrophy or degenerative changes.'  Patient however today reports she continues to struggle with some concentration problems and lack of motivation which she attributes to her cognitive issues.  She has a history of fibromyalgia which could also be a contributor.  Discussed adding Wellbutrin.  Also discussed working with her therapist about the same.   Visit Diagnosis:    ICD-10-CM   1. 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 Insomnia due to mental condition F51.05   4. Mild cognitive disorder F09 buPROPion (WELLBUTRIN) 75 MG tablet    Past Psychiatric History: I have reviewed past psychiatric history from my progress note on 12/10/2017.  Past trials of Paxil  Past Medical History:  Past Medical History:  Diagnosis Date  . Aneurysm ([place])   . Anxiety   . Arthritis   . Brain stem lesion   . Chronic kidney disease    kideny stone  . Depression   . Fibromyalgia   . GERD (gastroesophageal reflux disease)   . Headache   . Hypotension   . Hypothyroid   . Memory loss   . Migraine   . Raynaud disease   . Sjogren's syndrome ([place])   . Thyroid disease     Past Surgical History:  Procedure Laterality Date  . ABDOMINAL HYSTERECTOMY    . BASAL CELL CARCINOMA EXCISION    . BREAST BIOPSY    . CESAREAN SECTION    . EYE SURGERY    . KNEE SURGERY Left   . LITHOTRIPSY    . MUSCLE BIOPSY    . OOPHORECTOMY      Family Psychiatric History: Reviewed family psychiatric history from my progress note on 12/10/2017.  Family History:  Family History  Problem Relation Age of Onset  . Migraines Mother   . Anxiety disorder Mother   . Depression Mother   . Cancer Father   . Hypertension Father   . Stroke  Father   . Heart attack Father   . Alcohol abuse Father   . Diabetes Brother   . Alcohol abuse Sister   . Drug abuse Sister   . Anxiety disorder Sister   . Depression Sister   . Drug abuse Brother   . Cancer Paternal Aunt   . Lupus Paternal Aunt   . Thyroid disease Paternal Aunt   . Diabetes Maternal Grandfather   . Anxiety disorder Maternal Grandmother   . Depression Maternal Grandmother     Social History: Reviewed social history from my progress note on 12/10/2017. Social History   Socioeconomic History  . Marital status: Married    Spouse name: [name]  . Number of children: 2  . Years of education: 65  .  Highest education level: Associate degree: occupational, Hotel manager, or vocational program  Occupational History  . Occupation: Disabled  Social Needs  . Financial resource strain: Not hard at all  . Food insecurity:    Worry: Never true    Inability: Never true  . Transportation needs:    Medical: No    Non-medical: No  Tobacco Use  . Smoking status: Never Smoker  . Smokeless tobacco: Never Used  Substance and Sexual Activity  . Alcohol use: No    Alcohol/week: 0.0 standard drinks  . Drug use: Never  . Sexual activity: Yes    Birth control/protection: None  Lifestyle  . Physical activity:    Days per week: 7 days    Minutes per session: 30 min  . Stress: Very much  Relationships  . Social connections:    Talks on phone: Not on file    Gets together: Not on file    Attends religious service: More than 4 times per year    Active member of club or organization: No    Attends meetings of clubs or organizations: Never    Relationship status: Married  Other Topics Concern  . Not on file  Social History Narrative   Lives at home with husband. [name]).    Disabled.   Education college.   Right handed.   2 cups caffeine/daily    Allergies:  Allergies  Allergen Reactions  . Amoxicillin Hives  . Penicillins Hives    Metabolic Disorder Labs: Lab Results  Component Value Date   HGBA1C 5.0 11/13/2015   No results found for: PROLACTIN No results found for: CHOL, TRIG, HDL, CHOLHDL, VLDL, LDLCALC Lab Results  Component Value Date   TSH 1.780 10/26/2015   TSH 0.525 03/15/2015    Therapeutic Level Labs: No results found for: LITHIUM No results found for: VALPROATE No components found for:  CBMZ  Current Medications: Current Outpatient Medications  Medication Sig Dispense Refill  . alendronate (FOSAMAX) 70 MG tablet     . benztropine (COGENTIN) 0.5 MG tablet TAKE 1 TABLET BY MOUTH DAILY FOR 2 WEEKS, THEN INCREASE TO 1 TABLET TWICE DAILY. 180 tablet 0  .  Calcium-Vitamin D 600-200 MG-UNIT per tablet Take by mouth.    . Cholecalciferol (VITAMIN D-3) 1000 units CAPS Take by mouth daily.    . Cholecalciferol 1000 UNITS tablet Take by mouth.    . Cranberry-Vitamin C-Vitamin E (CRANBERRY/VITAMIN C TRIPLE ST PO) Take by mouth.    . fludrocortisone (FLORINEF) 0.1 MG tablet Take 2 tablets by mouth two times daily 360 tablet 2  . gabapentin (NEURONTIN) 300 MG capsule Take 1 capsule by mouth two times daily 180 capsule 2  . hydrOXYzine (VISTARIL) 25 MG capsule Take 1  capsule (25 mg total) by mouth daily as needed (for severe anxiety). Anxiety/agitation 90 capsule 0  . levothyroxine (SYNTHROID, LEVOTHROID) 175 MCG tablet Take 1 tablet (175 mcg total) by mouth daily before breakfast. 90 tablet 3  . Lurasidone HCl 60 MG TABS Take 1 tablet (60 mg total) by mouth daily with breakfast. 90 tablet 0  . omeprazole (PRILOSEC) 20 MG capsule Take 1 capsule (20 mg total) by mouth daily. 180 capsule 3  . PARoxetine (PAXIL) 40 MG tablet TAKE 1 TABLET BY MOUTH  EVERY MORNING 90 tablet 0  . traZODone (DESYREL) 50 MG tablet TAKE 1 TABLET(50 MG) BY MOUTH AT BEDTIME FOR SLEEP 90 tablet 1  . vitamin E 400 UNIT capsule Take 400 Units by mouth daily.    Marland Kitchen buPROPion (WELLBUTRIN) 75 MG tablet Take 1 tablet (75 mg total) by mouth daily. 30 tablet 1  . donepezil (ARICEPT) 10 MG tablet Take 1 tablet (10 mg total) by mouth at bedtime. 90 tablet 4   No current facility-administered medications for this visit.      Musculoskeletal: Strength & Muscle Tone: within normal limits Gait & Station: normal Patient leans: N/A  Psychiatric Specialty Exam: Review of Systems  Psychiatric/Behavioral: Positive for depression and memory loss.  All other systems reviewed and are negative.   Blood pressure 120/71, pulse 67, temperature 97.7 F (36.5 C), temperature source Oral, weight 163 lb 3.2 oz (74 kg).Body mass index is 25.56 kg/m.  General Appearance: Casual  Eye Contact:  Fair   Speech:  Clear and Coherent  Volume:  Normal  Mood:  Dysphoric  Affect:  Appropriate  Thought Process:  Goal Directed and Descriptions of Associations: Intact  Orientation:  Other:  situation, month,self, year  Thought Content: Logical   Suicidal Thoughts:  No  Homicidal Thoughts:  No  Memory:  Immediate;   Fair Recent;   Fair Remote;   Fair  Judgement:  Fair  Insight:  Fair  Psychomotor Activity:  Normal  Concentration:  Concentration: Fair and Attention Span: Fair  Recall:  [employer] of Knowledge: Fair  Language: Fair  Akathisia:  No  Handed:  Right  AIMS (if indicated): 0  Assets:  Communication Skills Desire for Improvement Social Support  ADL's:  Intact  Cognition:unspecified,likely mild  Sleep:  Fair   Screenings: Mini-Mental     Office Visit from 01/01/2016 in [facility] Office Visit from 11/13/2015 in [facility] Office Visit from 10/26/2015 in [facility] Office Visit from 04/25/2015 in [facility] Office Visit from 10/25/2014 in [facility]  Total Score (max 30 points )  25  26  21  23  29     PHQ2-9     Office Visit from 02/08/2015 in [facility] Office Visit from 10/07/2014 in [facility]  PHQ-2 Total Score  0  4  PHQ-9 Total Score  -  8       Assessment and Plan: Suhayla is a 58 year old Caucasian female who has a history of mood lability, anxiety disorder, cognitive disorder, Sjogren's syndrome, hypothyroidism, hypertension, arthritis, fibromyalgia, presented to clinic today for a follow-up visit.  Patient continues to struggle with some cognitive changes as well as concentration and motivation problems.  Patient will benefit from medication changes as noted below.  Plan Bipolar disorder type II-improving Paxil 40 mg p.o. daily Latuda 60 mg p.o. daily with meals. Hydroxyzine 25 mg p.o. daily PRN for severe anxiety  attacks  For insomnia-improving Trazodone  50 mg p.o. nightly  For neuroleptic induced movement disorder [place] today 0 Continue Cogentin 0.5 mg p.o. twice daily.  Mild cognitive disorder- unstable Patient continues to struggle with some concentration problems.  Patient will continue to follow-up with her neurologist.  She continues to take Aricept.  I have reviewed medical records in [facility] per Dr. Manuella Ghazi dated 10/01/2017 as summarized above. Per neurologists it is likely she has cognitive problems due to fibromyalgia.  Brain MRI was within normal limits as noted above. We will start Wellbutrin 75 mg p.o. daily  Discussed with patient to continue to work with her psychotherapist Ms. Alden Hipp.  Follow-up in clinic in 4 weeks or sooner if needed.  I have spent atleast 25 minutes face to face with patient today. More than 50 % of the time was spent for psychoeducation and supportive psychotherapy and care coordination.  This note was generated in part or whole with voice recognition software. Voice recognition is usually quite accurate but there are transcription errors that can and very often do occur. I apologize for any typographical errors that were not detected and corrected.        Ursula Alert, MD 04/24/2018, 8:22 AM

## 2018-04-23 NOTE — Patient Instructions (Signed)
Bupropion tablets (Depression/Mood Disorders)  What is this medicine?  BUPROPION (byoo PROE pee on) is used to treat depression.  This medicine may be used for other purposes; ask your health care provider or pharmacist if you have questions.  COMMON BRAND NAME(S): Wellbutrin  What should I tell my health care provider before I take this medicine?  They need to know if you have any of these conditions:  -an eating disorder, such as anorexia or bulimia  -bipolar disorder or psychosis  -diabetes or high blood sugar, treated with medication  -glaucoma  -heart disease, previous heart attack, or irregular heart beat  -head injury or brain tumor  -high blood pressure  -kidney or liver disease  -seizures  -suicidal thoughts or a previous suicide attempt  -Tourette's syndrome  -weight loss  -an unusual or allergic reaction to bupropion, other medicines, foods, dyes, or preservatives  -breast-feeding  -pregnant or trying to become pregnant  How should I use this medicine?  Take this medicine by mouth with a glass of water. Follow the directions on the prescription label. You can take it with or without food. If it upsets your stomach, take it with food. Take your medicine at regular intervals. Do not take your medicine more often than directed. Do not stop taking this medicine suddenly except upon the advice of your doctor. Stopping this medicine too quickly may cause serious side effects or your condition may worsen.  A special MedGuide will be given to you by the pharmacist with each prescription and refill. Be sure to read this information carefully each time.  Talk to your pediatrician regarding the use of this medicine in children. Special care may be needed.  Overdosage: If you think you have taken too much of this medicine contact a poison control center or emergency room at once.  NOTE: This medicine is only for you. Do not share this medicine with others.  What if I miss a dose?  If you miss a dose, take it as soon  as you can. If it is less than four hours to your next dose, take only that dose and skip the missed dose. Do not take double or extra doses.  What may interact with this medicine?  Do not take this medicine with any of the following medications:  -linezolid  -MAOIs like Azilect, Carbex, Eldepryl, Marplan, Nardil, and Parnate  -methylene blue (injected into a vein)  -other medicines that contain bupropion like Zyban  This medicine may also interact with the following medications:  -alcohol  -certain medicines for anxiety or sleep  -certain medicines for blood pressure like metoprolol, propranolol  -certain medicines for depression or psychotic disturbances  -certain medicines for HIV or AIDS like efavirenz, lopinavir, nelfinavir, ritonavir  -certain medicines for irregular heart beat like propafenone, flecainide  -certain medicines for Parkinson's disease like amantadine, levodopa  -certain medicines for seizures like carbamazepine, phenytoin, phenobarbital  -cimetidine  -clopidogrel  -cyclophosphamide  -digoxin  -furazolidone  -isoniazid  -nicotine  -orphenadrine  -procarbazine  -steroid medicines like prednisone or cortisone  -stimulant medicines for attention disorders, weight loss, or to stay awake  -tamoxifen  -theophylline  -thiotepa  -ticlopidine  -tramadol  -warfarin  This list may not describe all possible interactions. Give your health care provider a list of all the medicines, herbs, non-prescription drugs, or dietary supplements you use. Also tell them if you smoke, drink alcohol, or use illegal drugs. Some items may interact with your medicine.  What should I watch for   while using this medicine?  Tell your doctor if your symptoms do not get better or if they get worse. Visit your doctor or health care professional for regular checks on your progress. Because it may take several weeks to see the full effects of this medicine, it is important to continue your treatment as prescribed by your  doctor.  Patients and their families should watch out for new or worsening thoughts of suicide or depression. Also watch out for sudden changes in feelings such as feeling anxious, agitated, panicky, irritable, hostile, aggressive, impulsive, severely restless, overly excited and hyperactive, or not being able to sleep. If this happens, especially at the beginning of treatment or after a change in dose, call your health care professional.  Avoid alcoholic drinks while taking this medicine. Drinking excessive alcoholic beverages, using sleeping or anxiety medicines, or quickly stopping the use of these agents while taking this medicine may increase your risk for a seizure.  Do not drive or use heavy machinery until you know how this medicine affects you. This medicine can impair your ability to perform these tasks.  Do not take this medicine close to bedtime. It may prevent you from sleeping.  Your mouth may get dry. Chewing sugarless gum or sucking hard candy, and drinking plenty of water may help. Contact your doctor if the problem does not go away or is severe.  What side effects may I notice from receiving this medicine?  Side effects that you should report to your doctor or health care professional as soon as possible:  -allergic reactions like skin rash, itching or hives, swelling of the face, lips, or tongue  -breathing problems  -changes in vision  -confusion  -elevated mood, decreased need for sleep, racing thoughts, impulsive behavior  -fast or irregular heartbeat  -hallucinations, loss of contact with reality  -increased blood pressure  -redness, blistering, peeling or loosening of the skin, including inside the mouth  -seizures  -suicidal thoughts or other mood changes  -unusually weak or tired  -vomiting  Side effects that usually do not require medical attention (report to your doctor or health care professional if they continue or are bothersome):  -constipation  -headache  -loss of  appetite  -nausea  -tremors  -weight loss  This list may not describe all possible side effects. Call your doctor for medical advice about side effects. You may report side effects to FDA at 1-800-FDA-1088.  Where should I keep my medicine?  Keep out of the reach of children.  Store at room temperature between 20 and 25 degrees C (68 and 77 degrees F), away from direct sunlight and moisture. Keep tightly closed. Throw away any unused medicine after the expiration date.  NOTE: This sheet is a summary. It may not cover all possible information. If you have questions about this medicine, talk to your doctor, pharmacist, or health care provider.  © 2019 Elsevier/Gold Standard (2015-08-04 13:44:21)

## 2018-04-24 ENCOUNTER — Encounter: Payer: Self-pay | Admitting: Psychiatry

## 2018-05-01 ENCOUNTER — Encounter: Payer: Self-pay | Admitting: Licensed Clinical Social Worker

## 2018-05-01 ENCOUNTER — Ambulatory Visit (INDEPENDENT_AMBULATORY_CARE_PROVIDER_SITE_OTHER): Payer: 59 | Admitting: Licensed Clinical Social Worker

## 2018-05-01 DIAGNOSIS — F3181 Bipolar II disorder: Secondary | ICD-10-CM | POA: Diagnosis not present

## 2018-05-01 NOTE — Progress Notes (Signed)
   THERAPIST PROGRESS NOTE  Session Time: 0900-0916  Participation Level: Minimal  Behavioral Response: NeatAlertAnxious  Type of Therapy: Individual Therapy  Treatment Goals addressed: Anxiety  Interventions: CBT  Summary: Stacy [name] is a 58 y.o. female who presents with continued symptoms of her diagnosis. Jenika reports doing "okay, "since our last session. She reports increased anxiety and continued agitation. She reports, "I just get very anxious about leaving the house. Coming here, I got very anxious. I'm still anxious. I had plans with a friend today that I cancelled because I just couldn't do it." LCSW reviewed the concepts of CBT, and the relationship between thoughts, feelings, and behaviors. Tyaisha was able to identify her anxious thoughts around spending time with a friend, "I just couldn't leave. I can't go anywhere." Tahlor was able to understand the connection between thoughts, feelings, and behaviors. She was not able to articulate a more positively framed thought at this time, though was able to understand the idea when LCSW modeled a new, positively framed thought. LCSW provided [place] with  27 different CBT skill cards used to shift thinking patterns that she could utilize before our next session. Yona expressed feeling she "just wanted to burst into tears," because she felt trapped in LCSW's office. LCSW recognized and validated this thought, and encouraged Stephenie to use her voice to assert this to LCSW in the future. LCSW suggested we build sessions gradually--and next time we will aim to have a longer session. Xandrea was in agreement with this idea. LCSW asked Edlin to try all the skills cards provided, and put stars next to the ones that work and X's next to the ones that do not. Odell expressed agreement.   Suicidal/Homicidal: No  Therapist Response: Kayana continues to work towards her tx goals but has not yet reached them. She is unable to  remain in therapy sessions for the entire session at this time due to overwhelming anxiety symptoms. We will continue to utilize CBT moving forward to assist [place] in managing her anxiety.   Plan: Return again in 2 weeks.  Diagnosis: Axis I: Bipolar 2 Disorder    Axis II: No diagnosis    Alden Hipp, LCSW 05/01/2018

## 2018-05-13 ENCOUNTER — Ambulatory Visit: Payer: Medicare Other | Admitting: Psychiatry

## 2018-05-20 ENCOUNTER — Ambulatory Visit (INDEPENDENT_AMBULATORY_CARE_PROVIDER_SITE_OTHER): Payer: 59 | Admitting: Psychiatry

## 2018-05-20 ENCOUNTER — Ambulatory Visit: Payer: Medicare Other | Admitting: Licensed Clinical Social Worker

## 2018-05-20 ENCOUNTER — Other Ambulatory Visit: Payer: Self-pay

## 2018-05-20 ENCOUNTER — Encounter: Payer: Self-pay | Admitting: Psychiatry

## 2018-05-20 VITALS — BP 120/78 | HR 67 | Temp 97.5°F | Wt 163.4 lb

## 2018-05-20 DIAGNOSIS — F3181 Bipolar II disorder: Secondary | ICD-10-CM | POA: Diagnosis not present

## 2018-05-20 DIAGNOSIS — G2111 Neuroleptic induced parkinsonism: Secondary | ICD-10-CM

## 2018-05-20 DIAGNOSIS — F5105 Insomnia due to other mental disorder: Secondary | ICD-10-CM

## 2018-05-20 DIAGNOSIS — F411 Generalized anxiety disorder: Secondary | ICD-10-CM | POA: Diagnosis not present

## 2018-05-20 DIAGNOSIS — F09 Unspecified mental disorder due to known physiological condition: Secondary | ICD-10-CM

## 2018-05-20 MED ORDER — BUSPIRONE HCL 10 MG PO TABS: 10.0000 mg | ORAL_TABLET | Freq: Two times a day (BID) | ORAL | 1 refills | Status: DC

## 2018-05-20 MED ORDER — PAROXETINE HCL 40 MG PO TABS: 40.0000 mg | ORAL_TABLET | Freq: Every morning | ORAL | 0 refills | Status: DC

## 2018-05-20 MED ORDER — LURASIDONE HCL 40 MG PO TABS: 40.0000 mg | ORAL_TABLET | Freq: Every day | ORAL | 0 refills | Status: DC

## 2018-05-20 MED ORDER — HYDROXYZINE PAMOATE 25 MG PO CAPS: 25.0000 mg | ORAL_CAPSULE | Freq: Every day | ORAL | 0 refills | Status: DC | PRN

## 2018-05-20 MED ORDER — BENZTROPINE MESYLATE 1 MG PO TABS: 1.0000 mg | ORAL_TABLET | Freq: Two times a day (BID) | ORAL | 0 refills | Status: DC

## 2018-05-20 NOTE — Progress Notes (Signed)
[facility] OP Progress Note  05/20/2018 3:29 PM Stacy [name]  MRN:  [id number]  Chief Complaint: ' I am here for follow up." Chief Complaint    Follow-up     HPI: Stacy [name] is a 58 year old Caucasian female, unemployed, married, lives in [place], has a history of bipolar disorder type II, mild cognitive disorder, insomnia, neuroleptic induced parkinsonism, Sjogren's syndrome, hypertension, arthritis, hypothyroidism, migraine headaches, presented to clinic today for a follow-up visit.  Patient was scheduled for a virtual visit however reported that she did not get the message left by the clinic.  Patient today appeared to be anxious and restless throughout the session today.  She kept moving her lower extremities.  She also has movements around her mouth.  Hence an AIMS screen was done and she scored high on the same-9 today.  Discussed with patient about readjusting her Latuda dosage and also increasing her Cogentin.  She agreed with plan.  Patient also reports increased worrying, racing thoughts, inability to sit still which she had even before the [place] was started.  Hence it is likely that all of these agitation and anxiety symptoms are just not related to her medications. However she has noticed some worsening symptoms since starting wellbutrin. Discussed discontinuing wellbutrin. Discussed adding medication to help with her anxiety symptoms in general.  She agreed with plan.  Patient denies any suicidality, homicidality or perceptual disturbances.  Patient denies any other concerns today. Visit Diagnosis:    ICD-10-CM   1. Bipolar 2 disorder (HCC) F31.81 lurasidone (LATUDA) 40 MG TABS tablet    busPIRone (BUSPAR) 10 MG tablet    PARoxetine (PAXIL) 40 MG tablet    hydrOXYzine (VISTARIL) 25 MG capsule  2. GAD (generalized anxiety disorder) F41.1   3. Insomnia due to mental condition F51.05   4. Neuroleptic induced Parkinsonism (HCC) G21.11 benztropine (COGENTIN) 1 MG tablet  5.  Mild cognitive disorder F09     Past Psychiatric History: Reviewed past psychiatric history from my progress note on 12/10/2017.  Past trials of Paxil.  Past Medical History:  Past Medical History:  Diagnosis Date  . Aneurysm ([place])   . Anxiety   . Arthritis   . Brain stem lesion   . Chronic kidney disease    kideny stone  . Depression   . Fibromyalgia   . GERD (gastroesophageal reflux disease)   . Headache   . Hypotension   . Hypothyroid   . Memory loss   . Migraine   . Raynaud disease   . Sjogren's syndrome ([place])   . Thyroid disease     Past Surgical History:  Procedure Laterality Date  . ABDOMINAL HYSTERECTOMY    . BASAL CELL CARCINOMA EXCISION    . BREAST BIOPSY    . CESAREAN SECTION    . EYE SURGERY    . KNEE SURGERY Left   . LITHOTRIPSY    . MUSCLE BIOPSY    . OOPHORECTOMY      Family Psychiatric History: Reviewed family psychiatric history from my progress note on 12/10/2017.  Family History:  Family History  Problem Relation Age of Onset  . Migraines Mother   . Anxiety disorder Mother   . Depression Mother   . Cancer Father   . Hypertension Father   . Stroke Father   . Heart attack Father   . Alcohol abuse Father   . Diabetes Brother   . Alcohol abuse Sister   . Drug abuse Sister   . Anxiety disorder Sister   .  Depression Sister   . Drug abuse Brother   . Cancer Paternal Aunt   . Lupus Paternal Aunt   . Thyroid disease Paternal Aunt   . Diabetes Maternal Grandfather   . Anxiety disorder Maternal Grandmother   . Depression Maternal Grandmother     Social History: Reviewed social history from my progress note on 12/10/2017. Social History   Socioeconomic History  . Marital status: Married    Spouse name: Stacy [name]  . Number of children: 2  . Years of education: 18  . Highest education level: Associate degree: occupational, Hotel manager, or vocational program  Occupational History  . Occupation: Disabled  Social Needs  . Financial resource  strain: Not hard at all  . Food insecurity:    Worry: Never true    Inability: Never true  . Transportation needs:    Medical: No    Non-medical: No  Tobacco Use  . Smoking status: Never Smoker  . Smokeless tobacco: Never Used  Substance and Sexual Activity  . Alcohol use: No    Alcohol/week: 0.0 standard drinks  . Drug use: Never  . Sexual activity: Yes    Birth control/protection: None  Lifestyle  . Physical activity:    Days per week: 7 days    Minutes per session: 30 min  . Stress: Very much  Relationships  . Social connections:    Talks on phone: Not on file    Gets together: Not on file    Attends religious service: More than 4 times per year    Active member of club or organization: No    Attends meetings of clubs or organizations: Never    Relationship status: Married  Other Topics Concern  . Not on file  Social History Narrative   Lives at home with husband. Stacy [name]).    Disabled.   Education college.   Right handed.   2 cups caffeine/daily    Allergies:  Allergies  Allergen Reactions  . Amoxicillin Hives  . Penicillins Hives    Metabolic Disorder Labs: Lab Results  Component Value Date   HGBA1C 5.0 11/13/2015   No results found for: PROLACTIN No results found for: CHOL, TRIG, HDL, CHOLHDL, VLDL, LDLCALC Lab Results  Component Value Date   TSH 1.780 10/26/2015   TSH 0.525 03/15/2015    Therapeutic Level Labs: No results found for: LITHIUM No results found for: VALPROATE No components found for:  CBMZ  Current Medications: Current Outpatient Medications  Medication Sig Dispense Refill  . alendronate (FOSAMAX) 70 MG tablet     . Calcium-Vitamin D 600-200 MG-UNIT per tablet Take by mouth.    . Cholecalciferol (VITAMIN D-3) 1000 units CAPS Take by mouth daily.    . Cholecalciferol 1000 UNITS tablet Take by mouth.    . Cranberry-Vitamin C-Vitamin E (CRANBERRY/VITAMIN C TRIPLE ST PO) Take by mouth.    . fludrocortisone (FLORINEF) 0.1 MG  tablet Take 2 tablets by mouth two times daily 360 tablet 2  . gabapentin (NEURONTIN) 300 MG capsule Take 1 capsule by mouth two times daily 180 capsule 2  . hydrOXYzine (VISTARIL) 25 MG capsule Take 1 capsule (25 mg total) by mouth daily as needed (for severe anxiety). Anxiety/agitation 90 capsule 0  . levothyroxine (SYNTHROID, LEVOTHROID) 175 MCG tablet Take 1 tablet (175 mcg total) by mouth daily before breakfast. 90 tablet 3  . omeprazole (PRILOSEC) 20 MG capsule Take 1 capsule (20 mg total) by mouth daily. 180 capsule 3  . PARoxetine (PAXIL) 40 MG tablet Take  1 tablet (40 mg total) by mouth every morning. 90 tablet 0  . traZODone (DESYREL) 50 MG tablet TAKE 1 TABLET(50 MG) BY MOUTH AT BEDTIME FOR SLEEP 90 tablet 1  . vitamin E 400 UNIT capsule Take 400 Units by mouth daily.    . benztropine (COGENTIN) 1 MG tablet Take 1 tablet (1 mg total) by mouth 2 (two) times daily. 180 tablet 0  . busPIRone (BUSPAR) 10 MG tablet Take 1 tablet (10 mg total) by mouth 2 (two) times daily. 60 tablet 1  . donepezil (ARICEPT) 10 MG tablet Take 1 tablet (10 mg total) by mouth at bedtime. 90 tablet 4  . lurasidone (LATUDA) 40 MG TABS tablet Take 1 tablet (40 mg total) by mouth daily with supper. 90 tablet 0  . SUMAtriptan (IMITREX) 100 MG tablet      No current facility-administered medications for this visit.      Musculoskeletal: Strength & Muscle Tone: within normal limits Gait & Station: normal Patient leans: N/A  Psychiatric Specialty Exam: Review of Systems  Psychiatric/Behavioral: The patient is nervous/anxious.   All other systems reviewed and are negative.   Blood pressure 120/78, pulse 67, temperature (!) 97.5 F (36.4 C), temperature source Oral, weight 163 lb 6.4 oz (74.1 kg).Body mass index is 25.59 kg/m.  General Appearance: Casual  Eye Contact:  Fair  Speech:  Clear and Coherent  Volume:  Decreased  Mood:  Anxious  Affect:  Congruent  Thought Process:  Goal Directed and  Descriptions of Associations: Intact  Orientation:  Full (Time, Place, and Person)  Thought Content: Logical   Suicidal Thoughts:  No  Homicidal Thoughts:  No  Memory:  Immediate;   Fair Recent;   Fair Remote;   Fair  Judgement:  Fair  Insight:  Fair  Psychomotor Activity:  Restlessness  Concentration:  Concentration: Fair and Attention Span: Fair  Recall:  [employer] of Knowledge: Fair  Language: Fair  Akathisia:  No  Handed:  Right  AIMS (if indicated): 9  Assets:  Communication Skills Desire for Improvement Social Support  ADL's:  Intact  Cognition: WNL  Sleep:  Fair   Screenings: AIMS     Office Visit from 05/20/2018 in [facility] Total Score  9    Mini-Mental     Office Visit from 01/01/2016 in [facility] Office Visit from 11/13/2015 in [facility] Office Visit from 10/26/2015 in [facility] Office Visit from 04/25/2015 in [facility] Office Visit from 10/25/2014 in [facility]  Total Score (max 30 points )  25  26  21  23  29     PHQ2-9     Office Visit from 02/08/2015 in [facility] Office Visit from 10/07/2014 in [facility]  PHQ-2 Total Score  0  4  PHQ-9 Total Score  -  8       Assessment and Plan: Laconya is a 58 year old Caucasian female who has a history of bipolar disorder type II, neuroleptic induced parkinsonism, insomnia, cognitive disorder, Sjogren's syndrome, hypothyroidism, hypertension, arthritis, fibromyalgia, presented to clinic today for follow-up visit.  Patient currently struggles with restlessness, anxiety and agitation.  We will continue to make medication readjustment.  Plan Bipolar disorder type II-unstable Continue Paxil 40 mg p.o. daily Reduce Latuda to 40 mg p.o. daily with meals Hydroxyzine 25 mg daily PRN for severe anxiety attacks Start BuSpar 10 mg p.o. twice  daily  For GAD- unstable Add  BuSpar 10 mg p.o. twice daily Discontinue Wellbutrin due to worsening anxiety symptoms Continue CBT  For insomnia-improving Trazodone 50 mg p.o. nightly  For neuroleptic induced movement disorder- unstable Aims equals 9 Continue Cogentin but increase to 1 mg p.o. twice daily  Mild cognitive disorder- some improvement She will continue Aricept and continue follow-up with her neurologist.  Patient to continue psychotherapy sessions with Alden Hipp.  Follow-up in clinic in 2 weeks or sooner.  I have spent atleast 25 minutes face to face with patient today. More than 50 % of the time was spent for psychoeducation and supportive psychotherapy and care coordination.  This note was generated in part or whole with voice recognition software. Voice recognition is usually quite accurate but there are transcription errors that can and very often do occur. I apologize for any typographical errors that were not detected and corrected.          Ursula Alert, MD 05/20/2018, 3:29 PM

## 2018-05-20 NOTE — Patient Instructions (Signed)
Buspirone tablets What is this medicine? BUSPIRONE (byoo SPYE rone) is used to treat anxiety disorders. This medicine may be used for other purposes; ask your health care provider or pharmacist if you have questions. COMMON BRAND NAME(S): BuSpar What should I tell my health care provider before I take this medicine? They need to know if you have any of these conditions: -kidney or liver disease -an unusual or allergic reaction to buspirone, other medicines, foods, dyes, or preservatives -pregnant or trying to get pregnant -breast-feeding How should I use this medicine? Take this medicine by mouth with a glass of water. Follow the directions on the prescription label. You may take this medicine with or without food. To ensure that this medicine always works the same way for you, you should take it either always with or always without food. Take your doses at regular intervals. Do not take your medicine more often than directed. Do not stop taking except on the advice of your doctor or health care professional. Talk to your pediatrician regarding the use of this medicine in children. Special care may be needed. Overdosage: If you think you have taken too much of this medicine contact a poison control center or emergency room at once. NOTE: This medicine is only for you. Do not share this medicine with others. What if I miss a dose? If you miss a dose, take it as soon as you can. If it is almost time for your next dose, take only that dose. Do not take double or extra doses. What may interact with this medicine? Do not take this medicine with any of the following medications: -linezolid -MAOIs like Carbex, Eldepryl, Marplan, Nardil, and Parnate -methylene blue -procarbazine This medicine may also interact with the following medications: -diazepam -digoxin -diltiazem -erythromycin -grapefruit juice -haloperidol -medicines for mental depression or mood problems -medicines for seizures like  carbamazepine, phenobarbital and phenytoin -nefazodone -other medications for anxiety -rifampin -ritonavir -some antifungal medicines like itraconazole, ketoconazole, and voriconazole -verapamil -warfarin This list may not describe all possible interactions. Give your health care provider a list of all the medicines, herbs, non-prescription drugs, or dietary supplements you use. Also tell them if you smoke, drink alcohol, or use illegal drugs. Some items may interact with your medicine. What should I watch for while using this medicine? Visit your doctor or health care professional for regular checks on your progress. It may take 1 to 2 weeks before your anxiety gets better. You may get drowsy or dizzy. Do not drive, use machinery, or do anything that needs mental alertness until you know how this drug affects you. Do not stand or sit up quickly, especially if you are an older patient. This reduces the risk of dizzy or fainting spells. Alcohol can make you more drowsy and dizzy. Avoid alcoholic drinks. What side effects may I notice from receiving this medicine? Side effects that you should report to your doctor or health care professional as soon as possible: -blurred vision or other vision changes -chest pain -confusion -difficulty breathing -feelings of hostility or anger -muscle aches and pains -numbness or tingling in hands or feet -ringing in the ears -skin rash and itching -vomiting -weakness Side effects that usually do not require medical attention (report to your doctor or health care professional if they continue or are bothersome): -disturbed dreams, nightmares -headache -nausea -restlessness or nervousness -sore throat and nasal congestion -stomach upset This list may not describe all possible side effects. Call your doctor for medical advice about side   effects. You may report side effects to FDA at 1-800-FDA-1088. Where should I keep my medicine? Keep out of the reach  of children. Store at room temperature below 30 degrees C (86 degrees F). Protect from light. Keep container tightly closed. Throw away any unused medicine after the expiration date. NOTE: This sheet is a summary. It may not cover all possible information. If you have questions about this medicine, talk to your doctor, pharmacist, or health care provider.  2019 Elsevier/Gold Standard (2009-09-21 18:06:11)  

## 2018-06-01 ENCOUNTER — Other Ambulatory Visit: Payer: Self-pay

## 2018-06-01 ENCOUNTER — Ambulatory Visit (INDEPENDENT_AMBULATORY_CARE_PROVIDER_SITE_OTHER): Payer: 59 | Admitting: Licensed Clinical Social Worker

## 2018-06-01 ENCOUNTER — Encounter: Payer: Self-pay | Admitting: Licensed Clinical Social Worker

## 2018-06-01 DIAGNOSIS — F411 Generalized anxiety disorder: Secondary | ICD-10-CM

## 2018-06-01 DIAGNOSIS — F3181 Bipolar II disorder: Secondary | ICD-10-CM

## 2018-06-01 NOTE — Progress Notes (Signed)
   THERAPIST PROGRESS NOTE Virtual Visit via Telephone Note  I connected with Stacy [name] on 06/01/18 at  1:30 PM EDT by telephone and verified that I am speaking with the correct person using two identifiers.   I discussed the limitations, risks, security and privacy concerns of performing an evaluation and management service by telephone and the availability of in person appointments. I also discussed with the patient that there may be a patient responsible charge related to this service. The patient expressed understanding and agreed to proceed.  I discussed the assessment and treatment plan with the patient. The patient was provided an opportunity to ask questions and all were answered. The patient agreed with the plan and demonstrated an understanding of the instructions.   The patient was advised to call back or seek an in-person evaluation if the symptoms worsen or if the condition fails to improve as anticipated.  Alden Hipp, LCSW    Session Time: 1330  Participation Level: Minimal  Behavioral Response: NAAlertAnxious  Type of Therapy: Individual Therapy  Treatment Goals addressed: Anxiety  Interventions: CBT  Summary: Stacy [name] is a 58 y.o. female who presents with continued symptoms of her diagnosis. Stacy [name] reports she has been doing well since our last session. She reports the MD in the clinic changed some of her medications due to feeling an increase in anxiety. Stacy [name] reports that since medications were changed, she has noticed a noted improvement in her anxiety symptoms. She stated she sometimes feels anxious when in public, and feels she needs to get out of the situation immediately. LCSW suggested Tziporah start utilizing distraction techniques when feeling overly anxious in public situation such as listing items or noticing things around her in the area. Stacy [name] expressed understanding and agreement with this idea. LCSW also encouraged Manasvini to utilize  this technique when feeling overly anxious in other situations as well. Stacy [name] expressed agreement. Stacy [name] reported she had neurological testing done recently, and she has not heard back from her doctor which has increased her anxiety and she stated her thoughts "go to the worst case." LCSW encouraged Stacy [name] to utilize CBT in those moments and challenge the negative thoughts and come up with more likely/more positive thoughts. Stacy [name] expressed understanding and agreement with this idea as well.   Suicidal/Homicidal: No  Therapist Response: Rella continues to work towards her tx goals but has not yet reached them. She remains guarded during therapy sessions but is able to answer specific questions when prompted by LCSW. We will continue to work on emotional regulation and staying calm in the moment.   Plan: Return again in 4 weeks.  Diagnosis: Axis I: Bipolar 2 Disorder    Axis II: No diagnosis    Alden Hipp, LCSW 06/01/2018

## 2018-06-03 ENCOUNTER — Encounter: Payer: Self-pay | Admitting: Psychiatry

## 2018-06-03 ENCOUNTER — Other Ambulatory Visit: Payer: Self-pay

## 2018-06-03 ENCOUNTER — Ambulatory Visit (INDEPENDENT_AMBULATORY_CARE_PROVIDER_SITE_OTHER): Payer: 59 | Admitting: Psychiatry

## 2018-06-03 DIAGNOSIS — G2111 Neuroleptic induced parkinsonism: Secondary | ICD-10-CM | POA: Diagnosis not present

## 2018-06-03 DIAGNOSIS — F5105 Insomnia due to other mental disorder: Secondary | ICD-10-CM

## 2018-06-03 DIAGNOSIS — F411 Generalized anxiety disorder: Secondary | ICD-10-CM

## 2018-06-03 DIAGNOSIS — F09 Unspecified mental disorder due to known physiological condition: Secondary | ICD-10-CM

## 2018-06-03 DIAGNOSIS — F3181 Bipolar II disorder: Secondary | ICD-10-CM | POA: Diagnosis not present

## 2018-06-03 MED ORDER — LURASIDONE HCL 40 MG PO TABS: 20.0000 mg | ORAL_TABLET | Freq: Every day | ORAL | 0 refills | Status: DC

## 2018-06-03 NOTE — Progress Notes (Signed)
Tc on  06-03-18 @ 10:35 pt medical and surgical hx was reviewed with no changes. Pt allergies was reviewed with no changes. Pt mediatation and pharmacy were reviewed with no changes. No vital were done due to patient having a phone visit.

## 2018-06-03 NOTE — Progress Notes (Signed)
Virtual Visit via Video Note  I connected with Stacy [name] on 06/03/18 at 11:45 AM EDT by a video enabled telemedicine application and verified that I am speaking with the correct person using two identifiers.   I discussed the limitations of evaluation and management by telemedicine and the availability of in person appointments. The patient expressed understanding and agreed to proceed.   I discussed the assessment and treatment plan with the patient. The patient was provided an opportunity to ask questions and all were answered. The patient agreed with the plan and demonstrated an understanding of the instructions.   The patient was advised to call back or seek an in-person evaluation if the symptoms worsen or if the condition fails to improve as anticipated.  I provided 15  minutes of non-face-to-face time during this encounter.   Ursula Alert, MD  [facility] MD OP Progress Note  06/03/2018 5:04 PM Stacy [name]  MRN:  [id number]  Chief Complaint:  Chief Complaint    Follow-up     HPI: Stacy [name] is a 58 year old Caucasian female, unemployed, married, lives in [place], has a history of bipolar disorder type II, mild cognitive disorder, insomnia, neuroleptic induced parkinsonism, Sjogren's syndrome, hypertension, arthritis, hypothyroidism, migraine headaches was evaluated by telemedicine today.  Patient reports she has been coping with her anxiety symptoms okay so far.  She is trying to stay indoors more due to the COVID-19 crisis.  She reports today especially has been good for her since she has been up doing some work around the house.  Patient reports sleep is good.  Patient however reports she continues to have movements around her mouth.  She reports it does not bother her much however her husband has observed it and brought her attention to it recently.  She also reports she may be having some movements of her teeth which she is trying to deal with.  Discussed with her about  stopping the Latuda for a week to see if that will help relieve it.  Discussed with her to continue Cogentin.  Discussed with her to follow-up in 2 weeks from now to discuss further medication changes if she has worsening mood symptoms.  She denies any suicidality, homicidality or perceptual disturbances.  Patient agrees to make changes with her Latuda to see if her abnormal movements around her mouth gets better.   Visit Diagnosis:    ICD-10-CM   1. Bipolar 2 disorder (HCC) F31.81 lurasidone (LATUDA) 40 MG TABS tablet   mixed, mild  2. GAD (generalized anxiety disorder) F41.1   3. Insomnia due to mental condition F51.05   4. Neuroleptic induced Parkinsonism ([place]) G21.11   5. Mild cognitive disorder F09     Past Psychiatric History: Reviewed past psychiatric history from my progress note on 12/10/2017.  Past trials of Paxil, wellbutrin ( agitation, anxiety)  Past Medical History:  Past Medical History:  Diagnosis Date  . Aneurysm ([place])   . Anxiety   . Arthritis   . Brain stem lesion   . Chronic kidney disease    kideny stone  . Depression   . Fibromyalgia   . GERD (gastroesophageal reflux disease)   . Headache   . Hypotension   . Hypothyroid   . Memory loss   . Migraine   . Raynaud disease   . Sjogren's syndrome ([place])   . Thyroid disease     Past Surgical History:  Procedure Laterality Date  . ABDOMINAL HYSTERECTOMY    . BASAL CELL CARCINOMA EXCISION    .  BREAST BIOPSY    . CESAREAN SECTION    . EYE SURGERY    . KNEE SURGERY Left   . LITHOTRIPSY    . MUSCLE BIOPSY    . OOPHORECTOMY      Family Psychiatric History: Reviewed family psychiatric history from my progress note on 12/10/2017  Family History:  Family History  Problem Relation Age of Onset  . Migraines Mother   . Anxiety disorder Mother   . Depression Mother   . Cancer Father   . Hypertension Father   . Stroke Father   . Heart attack Father   . Alcohol abuse Father   . Diabetes Brother   .  Alcohol abuse Sister   . Drug abuse Sister   . Anxiety disorder Sister   . Depression Sister   . Drug abuse Brother   . Cancer Paternal Aunt   . Lupus Paternal Aunt   . Thyroid disease Paternal Aunt   . Diabetes Maternal Grandfather   . Anxiety disorder Maternal Grandmother   . Depression Maternal Grandmother     Social History: Reviewed social history from my progress note on 12/10/2017. Social History   Socioeconomic History  . Marital status: Married    Spouse name: [name]  . Number of children: 2  . Years of education: 77  . Highest education level: Associate degree: occupational, Hotel manager, or vocational program  Occupational History  . Occupation: Disabled  Social Needs  . Financial resource strain: Not hard at all  . Food insecurity:    Worry: Never true    Inability: Never true  . Transportation needs:    Medical: No    Non-medical: No  Tobacco Use  . Smoking status: Never Smoker  . Smokeless tobacco: Never Used  Substance and Sexual Activity  . Alcohol use: No    Alcohol/week: 0.0 standard drinks  . Drug use: Never  . Sexual activity: Yes    Birth control/protection: None  Lifestyle  . Physical activity:    Days per week: 7 days    Minutes per session: 30 min  . Stress: Very much  Relationships  . Social connections:    Talks on phone: Not on file    Gets together: Not on file    Attends religious service: More than 4 times per year    Active member of club or organization: No    Attends meetings of clubs or organizations: Never    Relationship status: Married  Other Topics Concern  . Not on file  Social History Narrative   Lives at home with husband. [name]).    Disabled.   Education college.   Right handed.   2 cups caffeine/daily    Allergies:  Allergies  Allergen Reactions  . Amoxicillin Hives  . Penicillins Hives    Metabolic Disorder Labs: Lab Results  Component Value Date   HGBA1C 5.0 11/13/2015   No results found for:  PROLACTIN No results found for: CHOL, TRIG, HDL, CHOLHDL, VLDL, LDLCALC Lab Results  Component Value Date   TSH 1.780 10/26/2015   TSH 0.525 03/15/2015    Therapeutic Level Labs: No results found for: LITHIUM No results found for: VALPROATE No components found for:  CBMZ  Current Medications: Current Outpatient Medications  Medication Sig Dispense Refill  . alendronate (FOSAMAX) 70 MG tablet     . benztropine (COGENTIN) 1 MG tablet Take 1 tablet (1 mg total) by mouth 2 (two) times daily. 180 tablet 0  . busPIRone (BUSPAR) 10 MG tablet  Take 1 tablet (10 mg total) by mouth 2 (two) times daily. 60 tablet 1  . Calcium-Vitamin D 600-200 MG-UNIT per tablet Take by mouth.    . Cholecalciferol (VITAMIN D-3) 1000 units CAPS Take by mouth daily.    . Cholecalciferol 1000 UNITS tablet Take by mouth.    . Cranberry-Vitamin C-Vitamin E (CRANBERRY/VITAMIN C TRIPLE ST PO) Take by mouth.    . fludrocortisone (FLORINEF) 0.1 MG tablet Take 2 tablets by mouth two times daily 360 tablet 2  . gabapentin (NEURONTIN) 300 MG capsule Take 1 capsule by mouth two times daily 180 capsule 2  . hydrOXYzine (VISTARIL) 25 MG capsule Take 1 capsule (25 mg total) by mouth daily as needed (for severe anxiety). Anxiety/agitation 90 capsule 0  . levothyroxine (SYNTHROID, LEVOTHROID) 175 MCG tablet Take 1 tablet (175 mcg total) by mouth daily before breakfast. 90 tablet 3  . lurasidone (LATUDA) 40 MG TABS tablet Take 0.5 tablets (20 mg total) by mouth daily with supper. For the next 7 days and stop taking it 90 tablet 0  . omeprazole (PRILOSEC) 20 MG capsule Take 1 capsule (20 mg total) by mouth daily. 180 capsule 3  . PARoxetine (PAXIL) 40 MG tablet Take 1 tablet (40 mg total) by mouth every morning. 90 tablet 0  . SUMAtriptan (IMITREX) 100 MG tablet     . traZODone (DESYREL) 50 MG tablet TAKE 1 TABLET(50 MG) BY MOUTH AT BEDTIME FOR SLEEP 90 tablet 1  . vitamin E 400 UNIT capsule Take 400 Units by mouth daily.    Marland Kitchen  donepezil (ARICEPT) 10 MG tablet Take 1 tablet (10 mg total) by mouth at bedtime. 90 tablet 4   No current facility-administered medications for this visit.      Musculoskeletal: Strength & Muscle Tone: UTA Gait & Station: UTA Patient leans: N/A  Psychiatric Specialty Exam: Review of Systems  Psychiatric/Behavioral: The patient is nervous/anxious.   All other systems reviewed and are negative.   There were no vitals taken for this visit.There is no height or weight on file to calculate BMI.  General Appearance: Casual  Eye Contact:  Fair  Speech:  Normal Rate  Volume:  Normal  Mood:  Anxious improving  Affect:  Congruent  Thought Process:  Goal Directed and Descriptions of Associations: Intact  Orientation:  Full (Time, Place, and Person)  Thought Content: Logical   Suicidal Thoughts:  No  Homicidal Thoughts:  No  Memory:  Immediate;   Fair Recent;   Fair Remote;   Fair  Judgement:  Fair  Insight:  Fair  Psychomotor Activity:  Normal  Concentration:  Concentration: Fair and Attention Span: Fair  Recall:  [employer] of Knowledge: Fair  Language: Fair  Akathisia:  No  Handed:  Right  AIMS (if indicated): As movement around her mouth  Assets:  Communication Skills Desire for Improvement Social Support  ADL's:  Intact  Cognition: WNL  Sleep:  Fair   Screenings: AIMS     Office Visit from 05/20/2018 in [facility] Total Score  9    Mini-Mental     Office Visit from 01/01/2016 in [facility] Office Visit from 11/13/2015 in [facility] Office Visit from 10/26/2015 in [facility] Office Visit from 04/25/2015 in [facility] Office Visit from 10/25/2014 in [facility]  Total Score (max 30 points )  25  26  21  23  29     PHQ2-9  Office Visit from 02/08/2015 in [facility] Office Visit from 10/07/2014 in [facility]  PHQ-2 Total Score  0  4  PHQ-9 Total Score  -  8       Assessment and Plan: Raylie is a 58 year old Caucasian female who has a history of bipolar disorder type II, neuroleptic induced parkinsonism, insomnia, cognitive disorder, Sjogren's syndrome, hypothyroidism, hypertension, arthritis, fibromyalgia was evaluated by telemedicine today.  Patient currently reports doing well on the current medication regimen except for having possible side effects to Latuda, abnormal movements around her mouth.  Patient is interested in stopping the Latuda to see if that will resolve.  She will reach out to writer for another follow-up in 2 weeks from now.  Plan Bipolar disorder type II- some progress Discontinue Latuda for side effects.  Discussed with her to take 20 mg for the next 1 week and stop taking it. Continue Paxil 40 mg p.o. daily Hydroxyzine 25 mg p.o. daily PRN for severe anxiety attacks BuSpar 10 mg p.o. twice daily.  GAD-some progress BuSpar 10 mg p.o. twice daily. Continue CBT.  For insomnia- improving Trazodone 50 mg p.o. nightly  For neuroleptic induced movement disorder-unstable Aims on 05/20/2018 -9 Continues to have movements around her mouth and does not think the Cogentin is very beneficial.  She is currently on Cogentin 1 mg p.o. twice daily. Discussed to stop [place] and she will reach out to Probation officer in 2 weeks.  For mild cognitive disorder- she will continue Aricept, will follow up with her neurologist.  Patient to continue psychotherapy sessions with Ms. Alden Hipp.  I have spent atleast 15 minutes non face to face with patient today. More than 50 % of the time was spent for psychoeducation and supportive psychotherapy and care coordination.  This note was generated in part or whole with voice recognition software. Voice recognition is usually quite accurate but there are transcription errors that can and very often do occur. I apologize for any  typographical errors that were not detected and corrected.        Ursula Alert, MD 06/03/2018, 5:04 PM

## 2018-06-15 ENCOUNTER — Other Ambulatory Visit: Payer: Self-pay

## 2018-06-15 ENCOUNTER — Other Ambulatory Visit: Payer: Self-pay | Admitting: Psychiatry

## 2018-06-15 ENCOUNTER — Encounter: Payer: Self-pay | Admitting: Psychiatry

## 2018-06-15 ENCOUNTER — Ambulatory Visit (INDEPENDENT_AMBULATORY_CARE_PROVIDER_SITE_OTHER): Payer: 59 | Admitting: Psychiatry

## 2018-06-15 DIAGNOSIS — F3181 Bipolar II disorder: Secondary | ICD-10-CM

## 2018-06-15 DIAGNOSIS — F09 Unspecified mental disorder due to known physiological condition: Secondary | ICD-10-CM

## 2018-06-15 DIAGNOSIS — F411 Generalized anxiety disorder: Secondary | ICD-10-CM

## 2018-06-15 DIAGNOSIS — G2111 Neuroleptic induced parkinsonism: Secondary | ICD-10-CM | POA: Diagnosis not present

## 2018-06-15 DIAGNOSIS — F5105 Insomnia due to other mental disorder: Secondary | ICD-10-CM

## 2018-06-15 MED ORDER — LAMOTRIGINE 25 MG PO TABS: 25.0000 mg | ORAL_TABLET | Freq: Every day | ORAL | 1 refills | Status: DC

## 2018-06-15 NOTE — Progress Notes (Signed)
Virtual Visit via Video Note  I connected with Stacy [name] on 06/15/18 at  9:00 AM EDT by a video enabled telemedicine application and verified that I am speaking with the correct person using two identifiers.   I discussed the limitations of evaluation and management by telemedicine and the availability of in person appointments. The patient expressed understanding and agreed to proceed.   I discussed the assessment and treatment plan with the patient. The patient was provided an opportunity to ask questions and all were answered. The patient agreed with the plan and demonstrated an understanding of the instructions.   The patient was advised to call back or seek an in-person evaluation if the symptoms worsen or if the condition fails to improve as anticipated.   [facility] MD OP Progress Note  06/15/2018 10:26 AM Stacy [name]  MRN:  [id number]  Chief Complaint:  Chief Complaint    Follow-up     HPI: Stacy [name] is a 58 year old Caucasian female, unemployed, married, lives in [place], has a history of bipolar disorder, GAD, insomnia, mild cognitive disorder, neuroleptic induced parkinsonism, Sjogren's syndrome, hypertension, arthritis, hypothyroidism, migraine headaches was evaluated by telemedicine today.  Patient today reports she has stopped taking the Latuda 2 days ago.  She reports she has noticed some improvement in her abnormal movements around her mouth.  She however reports that she continues to have some mild symptoms of movements inside her mouth and around her lips.  Her husband has observed it as well.  Discussed with patient to give it more time since the [place] was just stopped 2 days ago.  She continues to be compliant on the Cogentin.  She has noticed some worsening mood symptoms, fatigue, excessive sleep the last few days.  This is likely due to not being on Latuda.  Discussed adding another mood stabilizer like Lamictal.  She has never tried it before.  Patient  denies any suicidality, homicidality or perceptual disturbances.  She reports sleep is good.  Patient denies any other concerns today.  She continues to have good support from her husband.  She reports she continues to be in therapy with Ms. Stacy [name] which is going well.  Visit Diagnosis:    ICD-10-CM   1. Bipolar 2 disorder (HCC) F31.81 DISCONTINUED: lamoTRIgine (LAMICTAL) 25 MG tablet  2. GAD (generalized anxiety disorder) F41.1   3. Insomnia due to mental condition F51.05   4. Neuroleptic induced Parkinsonism ([place]) G21.11   5. Mild cognitive disorder F09     Past Psychiatric History: I have reviewed past psychiatric history from my progress note on 12/10/2017.  Past trials of Paxil, Wellbutrin-agitation, anxiety.  Past Medical History:  Past Medical History:  Diagnosis Date  . Aneurysm ([place])   . Anxiety   . Arthritis   . Brain stem lesion   . Chronic kidney disease    kideny stone  . Depression   . Fibromyalgia   . GERD (gastroesophageal reflux disease)   . Headache   . Hypotension   . Hypothyroid   . Memory loss   . Migraine   . Raynaud disease   . Sjogren's syndrome ([place])   . Thyroid disease     Past Surgical History:  Procedure Laterality Date  . ABDOMINAL HYSTERECTOMY    . BASAL CELL CARCINOMA EXCISION    . BREAST BIOPSY    . CESAREAN SECTION    . EYE SURGERY    . KNEE SURGERY Left   . LITHOTRIPSY    . MUSCLE  BIOPSY    . OOPHORECTOMY      Family Psychiatric History: I have reviewed family psychiatric history from my progress note on 12/10/2017.  Family History:  Family History  Problem Relation Age of Onset  . Migraines Mother   . Anxiety disorder Mother   . Depression Mother   . Cancer Father   . Hypertension Father   . Stroke Father   . Heart attack Father   . Alcohol abuse Father   . Diabetes Brother   . Alcohol abuse Sister   . Drug abuse Sister   . Anxiety disorder Sister   . Depression Sister   . Drug abuse Brother   . Cancer  Paternal Aunt   . Lupus Paternal Aunt   . Thyroid disease Paternal Aunt   . Diabetes Maternal Grandfather   . Anxiety disorder Maternal Grandmother   . Depression Maternal Grandmother     Social History: Reviewed social history from my progress note on 12/10/2017. Social History   Socioeconomic History  . Marital status: Married    Spouse name: Stacy [name]  . Number of children: 2  . Years of education: 88  . Highest education level: Associate degree: occupational, Hotel manager, or vocational program  Occupational History  . Occupation: Disabled  Social Needs  . Financial resource strain: Not hard at all  . Food insecurity:    Worry: Never true    Inability: Never true  . Transportation needs:    Medical: No    Non-medical: No  Tobacco Use  . Smoking status: Never Smoker  . Smokeless tobacco: Never Used  Substance and Sexual Activity  . Alcohol use: No    Alcohol/week: 0.0 standard drinks  . Drug use: Never  . Sexual activity: Yes    Birth control/protection: None  Lifestyle  . Physical activity:    Days per week: 7 days    Minutes per session: 30 min  . Stress: Very much  Relationships  . Social connections:    Talks on phone: Not on file    Gets together: Not on file    Attends religious service: More than 4 times per year    Active member of club or organization: No    Attends meetings of clubs or organizations: Never    Relationship status: Married  Other Topics Concern  . Not on file  Social History Narrative   Lives at home with husband. Stacy [name]).    Disabled.   Education college.   Right handed.   2 cups caffeine/daily    Allergies:  Allergies  Allergen Reactions  . Amoxicillin Hives  . Penicillins Hives    Metabolic Disorder Labs: Lab Results  Component Value Date   HGBA1C 5.0 11/13/2015   No results found for: PROLACTIN No results found for: CHOL, TRIG, HDL, CHOLHDL, VLDL, LDLCALC Lab Results  Component Value Date   TSH 1.780 10/26/2015    TSH 0.525 03/15/2015    Therapeutic Level Labs: No results found for: LITHIUM No results found for: VALPROATE No components found for:  CBMZ  Current Medications: Current Outpatient Medications  Medication Sig Dispense Refill  . alendronate (FOSAMAX) 70 MG tablet     . benztropine (COGENTIN) 1 MG tablet Take 1 tablet (1 mg total) by mouth 2 (two) times daily. 180 tablet 0  . busPIRone (BUSPAR) 10 MG tablet Take 1 tablet (10 mg total) by mouth 2 (two) times daily. 60 tablet 1  . Calcium-Vitamin D 600-200 MG-UNIT per tablet Take by mouth.    Marland Kitchen  Cholecalciferol (VITAMIN D-3) 1000 units CAPS Take by mouth daily.    . Cholecalciferol 1000 UNITS tablet Take by mouth.    . Cranberry-Vitamin C-Vitamin E (CRANBERRY/VITAMIN C TRIPLE ST PO) Take by mouth.    . fludrocortisone (FLORINEF) 0.1 MG tablet Take 2 tablets by mouth two times daily 360 tablet 2  . gabapentin (NEURONTIN) 300 MG capsule Take 1 capsule by mouth two times daily 180 capsule 2  . hydrOXYzine (VISTARIL) 25 MG capsule Take 1 capsule (25 mg total) by mouth daily as needed (for severe anxiety). Anxiety/agitation 90 capsule 0  . levothyroxine (SYNTHROID, LEVOTHROID) 175 MCG tablet Take 1 tablet (175 mcg total) by mouth daily before breakfast. 90 tablet 3  . lurasidone (LATUDA) 40 MG TABS tablet Take 0.5 tablets (20 mg total) by mouth daily with supper. For the next 7 days and stop taking it 90 tablet 0  . omeprazole (PRILOSEC) 20 MG capsule Take 1 capsule (20 mg total) by mouth daily. 180 capsule 3  . PARoxetine (PAXIL) 40 MG tablet Take 1 tablet (40 mg total) by mouth every morning. 90 tablet 0  . SUMAtriptan (IMITREX) 100 MG tablet     . traZODone (DESYREL) 50 MG tablet TAKE 1 TABLET(50 MG) BY MOUTH AT BEDTIME FOR SLEEP 90 tablet 1  . vitamin E 400 UNIT capsule Take 400 Units by mouth daily.    Marland Kitchen donepezil (ARICEPT) 10 MG tablet Take 1 tablet (10 mg total) by mouth at bedtime. 90 tablet 4  . lamoTRIgine (LAMICTAL) 25 MG tablet TAKE 1  TABLET(25 MG) BY MOUTH DAILY 90 tablet 0   No current facility-administered medications for this visit.      Musculoskeletal: Strength & Muscle Tone: UTA Gait & Station: UTA Patient leans: N/A  Psychiatric Specialty Exam: Review of Systems  Psychiatric/Behavioral: Positive for depression. The patient is nervous/anxious.   All other systems reviewed and are negative.   There were no vitals taken for this visit.There is no height or weight on file to calculate BMI.  General Appearance: Casual  Eye Contact:  Fair  Speech:  Normal Rate  Volume:  Normal  Mood:  Dysphoric  Affect:  Congruent  Thought Process:  Goal Directed and Descriptions of Associations: Intact  Orientation:  Full (Time, Place, and Person)  Thought Content: Logical   Suicidal Thoughts:  No  Homicidal Thoughts:  No  Memory:  Immediate;   Fair Recent;   Fair Remote;   Fair  Judgement:  Fair  Insight:  Fair  Psychomotor Activity:  Normal  Concentration:  Concentration: Fair and Attention Span: Fair  Recall:  [employer] of Knowledge: Fair  Language: Fair  Akathisia:  No  Handed:  Right  AIMS (if indicated): Does have abnormal movements around her mouth , reports it is improving  Assets:  Communication Skills Desire for Improvement Social Support  ADL's:  Intact  Cognition: WNL  Sleep:  Fair   Screenings: AIMS     Office Visit from 06/15/2018 in [place] Visit from 05/20/2018 in [place] Total Score  5  9    Mini-Mental     Office Visit from 01/01/2016 in [facility] Office Visit from 11/13/2015 in [facility] Office Visit from 10/26/2015 in [facility] Office Visit from 04/25/2015 in [facility] Office Visit from 10/25/2014 in [facility]  Total Score (max 30 points )  25  26  21  23   29  PHQ2-9     Office Visit from 02/08/2015 in [facility] Office Visit from 10/07/2014 in [facility]  PHQ-2 Total Score  0  4  PHQ-9 Total Score  -  8       Assessment and Plan: Korine is a 58 year old Caucasian female who has a history of bipolar disorder type II, neuroleptic induced parkinsonism, insomnia, cognitive disorder, Sjogren's syndrome, hypothyroidism, hypertension, arthritis, fibromyalgia was evaluated by telemedicine today.  Patient is completely off of the Latuda due to abnormal movements around her mouth.  She continues to have mood symptoms and hence discussed medication changes.  Plan as noted below.  Plan Bipolar disorder type II- unstable Latuda has been tapered off due to side effects. Start Lamictal 25 mg p.o. daily.  Discussed the risk of being on Lamictal including Stevens-Johnson syndrome. Renew Paxil 40 mg p.o. daily Hydroxyzine 25 mg p.o. daily PRN for severe anxiety attacks BuSpar 10 mg p.o. twice daily   For GAD- some progress BuSpar 10 mg p.o. twice daily Continue CBT  For insomnia-stable Trazodone 50 mg p.o. nightly  For neuroleptic induced movement disorder-improving Aims today is 5 which is an improvement from 05/20/2018-9 Anette Guarneri has been discontinued. Continue Cogentin 1 mg p.o. twice daily  Patient advised to continue CBT with Ms. Stacy [name].  Follow-up in clinic in 2 to 3 weeks or sooner if needed.  I have spent atleast 15 minutes non  face to face with patient today. More than 50 % of the time was spent for psychoeducation and supportive psychotherapy and care coordination.  This note was generated in part or whole with voice recognition software. Voice recognition is usually quite accurate but there are transcription errors that can and very often do occur. I apologize for any typographical errors that were not detected and corrected.      Ursula Alert, MD 06/15/2018, 10:26 AM

## 2018-06-15 NOTE — Progress Notes (Signed)
TC on  06-15-18@ 8:20 pt medical and surgical hx was reviewed with no changes. Pt allergies reviewed with no changes. Pt medications and pharmacy was reviewed with no updated. No vitals taken because this is a phone visit.

## 2018-07-01 ENCOUNTER — Other Ambulatory Visit: Payer: Self-pay

## 2018-07-01 ENCOUNTER — Encounter: Payer: Self-pay | Admitting: Licensed Clinical Social Worker

## 2018-07-01 ENCOUNTER — Ambulatory Visit (INDEPENDENT_AMBULATORY_CARE_PROVIDER_SITE_OTHER): Payer: 59 | Admitting: Licensed Clinical Social Worker

## 2018-07-01 DIAGNOSIS — F3181 Bipolar II disorder: Secondary | ICD-10-CM | POA: Diagnosis not present

## 2018-07-01 DIAGNOSIS — F411 Generalized anxiety disorder: Secondary | ICD-10-CM

## 2018-07-01 NOTE — Progress Notes (Signed)
Virtual Visit via Telephone Note  I connected with Stacy [name] on 07/01/18 at 11:00 AM EDT by telephone and verified that I am speaking with the correct person using two identifiers.   I discussed the limitations, risks, security and privacy concerns of performing an evaluation and management service by telephone and the availability of in person appointments. I also discussed with the patient that there may be a patient responsible charge related to this service. The patient expressed understanding and agreed to proceed.   I discussed the assessment and treatment plan with the patient. The patient was provided an opportunity to ask questions and all were answered. The patient agreed with the plan and demonstrated an understanding of the instructions.   The patient was advised to call back or seek an in-person evaluation if the symptoms worsen or if the condition fails to improve as anticipated.  I provided 25 minutes of non-face-to-face time during this encounter.   Alden Hipp, LCSW    THERAPIST PROGRESS NOTE  Session Time: 1100  Participation Level: Minimal  Behavioral Response: NAAlertAnxious  Type of Therapy: Individual Therapy  Treatment Goals addressed: Anxiety  Interventions: CBT  Summary: Stacy [name] is a 57 y.o. female who presents with continued symptoms of her diagnosis. Stacy [name] reports doing well since our last session, but reports she had to change medications due to a side effect, and the new medication is not working as well. Stacy [name] reports, "I just feel agitated a lot, and my anxiety has been worse, and I've felt depressed." LCSW encouraged Stacy [name] to give the new medication more time, but to also discuss these symptoms with her doctor in order to make sure the medication and dosage is the best for her. Stacy [name] expressed understanding and agreement with this idea. Stacy [name] reports she had a panic attack recently while trying to do something on the  computer. She states she did not utilize any of the grounding skills we've discussed previously, "I just got more and more agitated by everything and I didn't want to step away because it made me feel like a failure." LCSW encouraged Stacy [name] to utilize thought challenging techniques to change her negative automatic thoughts, and to remember the idea that feelings aren't facts. Stacy [name] expressed understanding and agreement with this idea as well. LCSW encouraged Stacy [name] to also focus on the parts of the situation she can control. She was able to calm back down after the situation when her husband recognized she was upset and took her on a drive. LCSW encouraged Stacy [name] to continue to lean on her husband as a Theatre manager. She stated she has been doing that but feels guilty for "putting him through this." LCSW highlighted that he is being there for her as a choice, and he could easily choose not to. Stacy [name] expressed understanding and agreement with this notion as well.   Suicidal/Homicidal: No  Therapist Response: Stacy [name] continues to work towards her tx goals but has not yet reached them. She is able to tolerate therapy for very brief time frames before expressing feeling like she is uncomfortable. Because of this, we are keeping sessions brief. Moving forward, we will continue to increase time frames of sessions and will work on improving CBT skills to manage anxiety and depression.   Plan: Return again in 4 weeks.  Diagnosis: Axis I: Bipolar 2 Disorder    Axis II: No diagnosis    Alden Hipp, LCSW 07/01/2018

## 2018-07-03 ENCOUNTER — Encounter: Payer: Self-pay | Admitting: Psychiatry

## 2018-07-03 ENCOUNTER — Ambulatory Visit (INDEPENDENT_AMBULATORY_CARE_PROVIDER_SITE_OTHER): Payer: 59 | Admitting: Psychiatry

## 2018-07-03 ENCOUNTER — Other Ambulatory Visit: Payer: Self-pay

## 2018-07-03 DIAGNOSIS — F411 Generalized anxiety disorder: Secondary | ICD-10-CM | POA: Diagnosis not present

## 2018-07-03 DIAGNOSIS — G2111 Neuroleptic induced parkinsonism: Secondary | ICD-10-CM

## 2018-07-03 DIAGNOSIS — F09 Unspecified mental disorder due to known physiological condition: Secondary | ICD-10-CM

## 2018-07-03 DIAGNOSIS — F5105 Insomnia due to other mental disorder: Secondary | ICD-10-CM

## 2018-07-03 DIAGNOSIS — F3181 Bipolar II disorder: Secondary | ICD-10-CM | POA: Diagnosis not present

## 2018-07-03 MED ORDER — LAMOTRIGINE 25 MG PO TABS: 75.0000 mg | ORAL_TABLET | Freq: Every day | ORAL | 0 refills | Status: DC

## 2018-07-03 NOTE — Progress Notes (Signed)
Virtual Visit via Video Note  I connected with Stacy [name] on 07/03/18 at 11:15 AM EDT by a video enabled telemedicine application and verified that I am speaking with the correct person using two identifiers.   I discussed the limitations of evaluation and management by telemedicine and the availability of in person appointments. The patient expressed understanding and agreed to proceed.    I discussed the assessment and treatment plan with the patient. The patient was provided an opportunity to ask questions and all were answered. The patient agreed with the plan and demonstrated an understanding of the instructions.   The patient was advised to call back or seek an in-person evaluation if the symptoms worsen or if the condition fails to improve as anticipated.   [place] MD  OP Progress Note  07/03/2018 1:21 PM Stacy [name]  MRN:  [id number]  Chief Complaint:  Chief Complaint    Follow-up     HPI: Stacy [name] is a 58 year old Caucasian female, unemployed, married, lives in [place], has a history of bipolar disorder, GAD, insomnia, mild cognitive disorder, neuroleptic induced parkinsonism, Sjogren's syndrome, hypertension, arthritis, hypothyroidism, migraine headaches was evaluated by telemedicine today.  Patient reports that she has completely stopped the [place].  She has not been taking it since the past 2 weeks or more.  Her movements around her mouth have improved.  She reports she is not grinding her teeth as she used to before.  She however reports she does struggle with some anxiety symptoms.  Recently she had an episode when she went into a panic mood and her anxiety symptoms lasted for 2 hours.  Her husband took her out for a drive and that helped.  She reports she is taking the Lamictal as prescribed.  She however has been getting these nightmares at least 3-4 times since the past couple of weeks.  She does not know if the Lamictal is causing it or not.  Discussed with patient  that trazodone could also cause nightmares.  She will monitor her symptoms closely.  She is agreeable to increasing her Lamictal dosage today.  However if her nightmares or other side effects worsen she will stop it.  Patient denies any suicidality, homicidality or perceptual disturbances. Visit Diagnosis:    ICD-10-CM   1. Bipolar 2 disorder (HCC) F31.81 lamoTRIgine (LAMICTAL) 25 MG tablet  2. GAD (generalized anxiety disorder) F41.1   3. Insomnia due to mental condition F51.05   4. 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 Mild cognitive disorder F09     Past Psychiatric History: Reviewed past psychiatric history from my progress note on 12/10/2017.  Past trials of Paxil, Wellbutrin-agitation, anxiety, Latuda- abnormal movements.  Past Medical History:  Past Medical History:  Diagnosis Date  . Aneurysm ([place])   . Anxiety   . Arthritis   . Brain stem lesion   . Chronic kidney disease    kideny stone  . Depression   . Fibromyalgia   . GERD (gastroesophageal reflux disease)   . Headache   . Hypotension   . Hypothyroid   . Memory loss   . Migraine   . Raynaud disease   . Sjogren's syndrome ([place])   . Thyroid disease     Past Surgical History:  Procedure Laterality Date  . ABDOMINAL HYSTERECTOMY    . BASAL CELL CARCINOMA EXCISION    . BREAST BIOPSY    . CESAREAN SECTION    . EYE SURGERY    . KNEE SURGERY Left   .  LITHOTRIPSY    . MUSCLE BIOPSY    . OOPHORECTOMY      Family Psychiatric History: Reviewed family psychiatric history from my progress note on 12/10/2017.  Family History:  Family History  Problem Relation Age of Onset  . Migraines Mother   . Anxiety disorder Mother   . Depression Mother   . Cancer Father   . Hypertension Father   . Stroke Father   . Heart attack Father   . Alcohol abuse Father   . Diabetes Brother   . Alcohol abuse Sister   . Drug abuse Sister   . Anxiety disorder Sister   . Depression Sister   . Drug abuse Brother    . Cancer Paternal Aunt   . Lupus Paternal Aunt   . Thyroid disease Paternal Aunt   . Diabetes Maternal Grandfather   . Anxiety disorder Maternal Grandmother   . Depression Maternal Grandmother     Social History: Reviewed social history from my progress note on 12/10/2017. Social History   Socioeconomic History  . Marital status: Married    Spouse name: [name]  . Number of children: 2  . Years of education: 55  . Highest education level: Associate degree: occupational, Hotel manager, or vocational program  Occupational History  . Occupation: Disabled  Social Needs  . Financial resource strain: Not hard at all  . Food insecurity:    Worry: Never true    Inability: Never true  . Transportation needs:    Medical: No    Non-medical: No  Tobacco Use  . Smoking status: Never Smoker  . Smokeless tobacco: Never Used  Substance and Sexual Activity  . Alcohol use: No    Alcohol/week: 0.0 standard drinks  . Drug use: Never  . Sexual activity: Yes    Birth control/protection: None  Lifestyle  . Physical activity:    Days per week: 7 days    Minutes per session: 30 min  . Stress: Very much  Relationships  . Social connections:    Talks on phone: Not on file    Gets together: Not on file    Attends religious service: More than 4 times per year    Active member of club or organization: No    Attends meetings of clubs or organizations: Never    Relationship status: Married  Other Topics Concern  . Not on file  Social History Narrative   Lives at home with husband. [name]).    Disabled.   Education college.   Right handed.   2 cups caffeine/daily    Allergies:  Allergies  Allergen Reactions  . Amoxicillin Hives  . Penicillins Hives    Metabolic Disorder Labs: Lab Results  Component Value Date   HGBA1C 5.0 11/13/2015   No results found for: PROLACTIN No results found for: CHOL, TRIG, HDL, CHOLHDL, VLDL, LDLCALC Lab Results  Component Value Date   TSH 1.780  10/26/2015   TSH 0.525 03/15/2015    Therapeutic Level Labs: No results found for: LITHIUM No results found for: VALPROATE No components found for:  CBMZ  Current Medications: Current Outpatient Medications  Medication Sig Dispense Refill  . alendronate (FOSAMAX) 70 MG tablet     . benztropine (COGENTIN) 1 MG tablet Take 1 tablet (1 mg total) by mouth 2 (two) times daily. 180 tablet 0  . busPIRone (BUSPAR) 10 MG tablet Take 1 tablet (10 mg total) by mouth 2 (two) times daily. 60 tablet 1  . Calcium-Vitamin D 600-200 MG-UNIT per tablet Take  by mouth.    . Cholecalciferol (VITAMIN D-3) 1000 units CAPS Take by mouth daily.    . Cholecalciferol 1000 UNITS tablet Take by mouth.    . Cranberry-Vitamin C-Vitamin E (CRANBERRY/VITAMIN C TRIPLE ST PO) Take by mouth.    . DEXILANT 60 MG capsule     . donepezil (ARICEPT) 10 MG tablet Take 1 tablet (10 mg total) by mouth at bedtime. 90 tablet 4  . famotidine (PEPCID) 40 MG tablet     . fludrocortisone (FLORINEF) 0.1 MG tablet Take 2 tablets by mouth two times daily 360 tablet 2  . gabapentin (NEURONTIN) 300 MG capsule Take 1 capsule by mouth two times daily 180 capsule 2  . hydrOXYzine (VISTARIL) 25 MG capsule Take 1 capsule (25 mg total) by mouth daily as needed (for severe anxiety). Anxiety/agitation 90 capsule 0  . lamoTRIgine (LAMICTAL) 25 MG tablet Take 3 tablets (75 mg total) by mouth daily. Start 2 tablets for 2 weeks and then increase to 3 tablets 90 tablet 0  . levothyroxine (SYNTHROID, LEVOTHROID) 175 MCG tablet Take 1 tablet (175 mcg total) by mouth daily before breakfast. 90 tablet 3  . omeprazole (PRILOSEC) 20 MG capsule Take 1 capsule (20 mg total) by mouth daily. 180 capsule 3  . PARoxetine (PAXIL) 40 MG tablet Take 1 tablet (40 mg total) by mouth every morning. 90 tablet 0  . SUMAtriptan (IMITREX) 100 MG tablet     . traZODone (DESYREL) 50 MG tablet TAKE 1 TABLET(50 MG) BY MOUTH AT BEDTIME FOR SLEEP 90 tablet 1  . vitamin E 400 UNIT  capsule Take 400 Units by mouth daily.     No current facility-administered medications for this visit.      Musculoskeletal: Strength & Muscle Tone: within normal limits Gait & Station: UTA Patient leans: N/A  Psychiatric Specialty Exam: Review of Systems  Psychiatric/Behavioral: The patient is nervous/anxious and has insomnia.   All other systems reviewed and are negative.   There were no vitals taken for this visit.There is no height or weight on file to calculate BMI.  General Appearance: Casual  Eye Contact:  Fair  Speech:  Clear and Coherent  Volume:  Normal  Mood:  Anxious  Affect:  Appropriate  Thought Process:  Goal Directed and Descriptions of Associations: Intact  Orientation:  Full (Time, Place, and Person)  Thought Content: Logical   Suicidal Thoughts:  No  Homicidal Thoughts:  No  Memory:  Immediate;   Fair Recent;   Fair Remote;   Fair  Judgement:  Fair  Insight:  Fair  Psychomotor Activity:  Normal  Concentration:  Concentration: Fair and Attention Span: Fair  Recall:  [employer] of Knowledge: Fair  Language: Fair  Akathisia:  No  Handed:  Right  AIMS (if indicated): has some movements around her mouth, but improved  Assets:  Communication Skills Desire for Improvement Housing Social Support  ADL's:  Intact  Cognition: WNL  Sleep:  Has nightmares   Screenings: AIMS     Office Visit from 06/15/2018 in [facility] Office Visit from 05/20/2018 in [facility] Total Score  5  9    Mini-Mental     Office Visit from 01/01/2016 in [facility] Office Visit from 11/13/2015 in [facility] Office Visit from 10/26/2015 in [facility] Office Visit from 04/25/2015 in [facility] Office Visit from 10/25/2014 in [facility]  Total Score (max 30 points )  25  26  21  23  29     PHQ2-9     Office Visit from  02/08/2015 in [facility] Office Visit from 10/07/2014 in [facility]  PHQ-2 Total Score  0  4  PHQ-9 Total Score  -  8       Assessment and Plan: Tala is a 58 year old Caucasian female who has a history of bipolar disorder type II, neuroleptic induced parkinsonism, insomnia, cognitive disorder, Sjogren's syndrome, hypothyroidism, hypertension, arthritis, fibromyalgia, was evaluated by telemedicine today.  Patient is currently struggling with anxiety symptoms.  We will continue to need medication changes.  Plan Bipolar disorder type II- some improvement Increase Lamictal to 50 mg p.o. daily for 2 weeks and then to 75 mg after that. Paxil 40 mg p.o. daily Hydroxyzine 25 mg p.o. daily as needed for anxiety attacks Per 10 mg p.o. twice daily  For GAD-improving BuSpar 10 mg p.o. twice daily Continue CBT  For insomnia- restless She reports nightmares. Discussed with patient to monitor herself closely and stop trazodone if nightmares get worse. For now she will continue trazodone 50 mg p.o. nightly  For neuroleptic induced movement disorder-improving Today has been discontinued   Patient advised to continue CBT with Ms. Alden Hipp.  Follow-up in clinic in 2 weeks or sooner if needed.  Appointment scheduled for May 22 at 11:15 AM.  I have spent atleast 15 minutes non face to face with patient today. More than 50 % of the time was spent for psychoeducation and supportive psychotherapy and care coordination.  This note was generated in part or whole with voice recognition software. Voice recognition is usually quite accurate but there are transcription errors that can and very often do occur. I apologize for any typographical errors that were not detected and corrected.         Ursula Alert, MD 07/03/2018, 1:21 PM

## 2018-07-06 ENCOUNTER — Encounter: Payer: Self-pay | Admitting: Emergency Medicine

## 2018-07-06 ENCOUNTER — Telehealth: Payer: Self-pay

## 2018-07-06 ENCOUNTER — Emergency Department: Payer: Managed Care, Other (non HMO)

## 2018-07-06 ENCOUNTER — Other Ambulatory Visit: Payer: Self-pay

## 2018-07-06 ENCOUNTER — Emergency Department
Admission: EM | Admit: 2018-07-06 | Discharge: 2018-07-06 | Disposition: A | Discharge status: Home / Self Care | Payer: Managed Care, Other (non HMO) | Attending: Emergency Medicine | Admitting: Emergency Medicine

## 2018-07-06 DIAGNOSIS — F319 Bipolar disorder, unspecified: Secondary | ICD-10-CM | POA: Diagnosis not present

## 2018-07-06 DIAGNOSIS — R112 Nausea with vomiting, unspecified: Secondary | ICD-10-CM

## 2018-07-06 DIAGNOSIS — R102 Pelvic and perineal pain: Secondary | ICD-10-CM | POA: Diagnosis not present

## 2018-07-06 DIAGNOSIS — T426X5A Adverse effect of other antiepileptic and sedative-hypnotic drugs, initial encounter: Secondary | ICD-10-CM | POA: Diagnosis not present

## 2018-07-06 DIAGNOSIS — R197 Diarrhea, unspecified: Secondary | ICD-10-CM | POA: Diagnosis not present

## 2018-07-06 DIAGNOSIS — T50905A Adverse effect of unspecified drugs, medicaments and biological substances, initial encounter: Secondary | ICD-10-CM

## 2018-07-06 LAB — URINALYSIS, COMPLETE (UACMP) WITH MICROSCOPIC
Bacteria, UA: NONE SEEN
Bilirubin Urine: NEGATIVE
Glucose, UA: NEGATIVE mg/dL
Ketones, ur: NEGATIVE mg/dL
Nitrite: NEGATIVE
Protein, ur: NEGATIVE mg/dL
Specific Gravity, Urine: 1.004 — ABNORMAL LOW (ref 1.005–1.030)
pH: 5 (ref 5.0–8.0)

## 2018-07-06 LAB — COMPREHENSIVE METABOLIC PANEL
ALT: 14 U/L (ref 0–44)
AST: 20 U/L (ref 15–41)
Albumin: 4.6 g/dL (ref 3.5–5.0)
Alkaline Phosphatase: 92 U/L (ref 38–126)
Anion gap: 9 (ref 5–15)
BUN: 13 mg/dL (ref 6–20)
CO2: 24 mmol/L (ref 22–32)
Calcium: 9.3 mg/dL (ref 8.9–10.3)
Chloride: 105 mmol/L (ref 98–111)
Creatinine, Ser: 0.83 mg/dL (ref 0.44–1.00)
GFR calc Af Amer: >60 mL/min (ref >60)
GFR calc non Af Amer: >60 mL/min (ref >60)
Glucose, Bld: 110 mg/dL — ABNORMAL HIGH (ref 70–99)
Potassium: 3.6 mmol/L (ref 3.5–5.1)
Sodium: 138 mmol/L (ref 135–145)
Total Bilirubin: 1 mg/dL (ref 0.3–1.2)
Total Protein: 7.4 g/dL (ref 6.5–8.1)

## 2018-07-06 LAB — CBC
HCT: 42.3 % (ref 36.0–46.0)
Hemoglobin: 14.2 g/dL (ref 12.0–15.0)
MCH: 31.5 pg (ref 26.0–34.0)
MCHC: 33.6 g/dL (ref 30.0–36.0)
MCV: 93.8 fL (ref 80.0–100.0)
Platelets: 189 10*3/uL (ref 150–400)
RBC: 4.51 MIL/uL (ref 3.87–5.11)
RDW: 12.4 % (ref 11.5–15.5)
WBC: 5.6 10*3/uL (ref 4.0–10.5)
nRBC: 0 % (ref 0.0–0.2)

## 2018-07-06 MED ORDER — IOHEXOL 300 MG/ML  SOLN
100.0000 mL | Freq: Once | INTRAMUSCULAR | Status: AC | PRN
  Administered 2018-07-06: 100 mL via INTRAVENOUS

## 2018-07-06 MED ORDER — ONDANSETRON 4 MG PO TBDP: 4.0000 mg | ORAL_TABLET | Freq: Three times a day (TID) | ORAL | 0 refills | Status: DC | PRN

## 2018-07-06 MED ORDER — LOPERAMIDE HCL 2 MG PO TABS: 2.0000 mg | ORAL_TABLET | Freq: Four times a day (QID) | ORAL | 0 refills | Status: DC | PRN

## 2018-07-06 NOTE — ED Triage Notes (Signed)
Pt reports started oon Lamotrigine 3 weeks ago and has been having NVD and sweats ever since. She called her MD and they recommended that she come to the ED because she might be having an allergic reaction. Pt states last took it yesterday. Pt advised not to take anymore at this time. Pt denies other sx's. Pt denies SOB or feeling like her throat is closing.

## 2018-07-06 NOTE — Telephone Encounter (Signed)
Please ask her to stop the lamictal and go to the nearest ED.Thanks

## 2018-07-06 NOTE — ED Notes (Signed)
Pt signed physical discharge form.

## 2018-07-06 NOTE — ED Provider Notes (Signed)
[facility] Emergency Department Provider Note  ____________________________________________  Time seen: Approximately 6:32 PM  I have reviewed the triage vital signs and the nursing notes.   HISTORY  Chief Complaint Emesis; Diarrhea; and Night Sweats    HPI Stacy [name] is a 58 y.o. female who presents the emergency department complaining of nausea, vomiting, diarrhea, for 3 weeks.  Patient reports that she has had intermittent waves of 3 to 4 days of symptoms, followed by several days being asymptomatic before symptoms return.  Patient reports that she was recently started on lamotrigine for her bipolar disorder.  She reports that the symptoms did follow her starting the new medication.  She has had no fevers or chills, upper abdominal pain, chest pain, shortness of breath, flank pain, dysuria, polyuria, hematuria, vaginal discharge or bleeding.  Patient reports that she has a stabbing sensation in her lower pelvis region as well as nausea, vomiting, diarrhea.  No hematemesis.  No hematochezia.  No medications for his complaint prior to arrival.  Patient reports that previous to lamotrigine she was on Latuda but had involuntary facial movement including grinding her teeth.        Past Medical History:  Diagnosis Date  . Aneurysm ([place])   . Anxiety   . Arthritis   . Brain stem lesion   . Chronic kidney disease    kideny stone  . Depression   . Fibromyalgia   . GERD (gastroesophageal reflux disease)   . Headache   . Hypotension   . Hypothyroid   . Memory loss   . Migraine   . Raynaud disease   . Sjogren's syndrome ([place])   . Thyroid disease     Patient Active Problem List   Diagnosis Date Noted  . Mild cognitive impairment 01/01/2016  . 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 Acquired hypothyroidism 06/19/2015  . Other long term (current) drug therapy 05/25/2015  . Chronic lumbosacral pain 05/08/2015  . Left lower quadrant  pain 02/08/2015  . Diverticulitis of large intestine without perforation or abscess without bleeding 02/08/2015  . Fibromyalgia 10/07/2014  . Hypothyroidism 10/07/2014  . History of kidney stones 10/07/2014  . 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) 10/07/2014  . Overweight (BMI 25.0-29.9) 10/07/2014  . Dementia without behavioral disturbance ([place]) 06/03/2014  . Common migraine with intractable migraine 09/23/2013  . Basal cell carcinoma 07/13/2013  . Hot flash, menopausal 07/13/2013  . 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 Calculus of kidney 07/13/2013  . Headache, migraine 07/13/2013  . 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 Blush 07/13/2013  . Sjogren's syndrome ([place]) 07/13/2013  . Hot flashes 07/13/2013    Past Surgical History:  Procedure Laterality Date  . ABDOMINAL HYSTERECTOMY    . BASAL CELL CARCINOMA EXCISION    . BREAST BIOPSY    . CESAREAN SECTION    . EYE SURGERY    . KNEE SURGERY Left   . LITHOTRIPSY    . MUSCLE BIOPSY    . OOPHORECTOMY      Prior to Admission medications   Medication Sig Start Date End Date Taking? Authorizing Provider  alendronate (FOSAMAX) 70 MG tablet  01/15/18   [provider]  benztropine (COGENTIN) 1 MG tablet Take 1 tablet (1 mg total) by mouth 2 (two) times daily. 05/20/18   Ursula Alert, MD  busPIRone (BUSPAR) 10 MG tablet Take 1 tablet (10 mg total) by mouth 2 (  two) times daily. 05/20/18   Ursula Alert, MD  Calcium-Vitamin D 600-200 MG-UNIT per tablet Take by mouth.    [provider]  Cholecalciferol (VITAMIN D-3) 1000 units CAPS Take by mouth daily.    [provider]  Cholecalciferol 1000 UNITS tablet Take by mouth.    [provider]  Cranberry-Vitamin C-Vitamin E (CRANBERRY/VITAMIN C TRIPLE ST PO) Take by mouth.    [provider]  DEXILANT 60 MG  capsule  06/30/18   [provider]  donepezil (ARICEPT) 10 MG tablet Take 1 tablet (10 mg total) by mouth at bedtime. 10/26/15 10/25/16  Marcial Pacas, MD  famotidine (PEPCID) 40 MG tablet  06/30/18   [provider]  fludrocortisone (FLORINEF) 0.1 MG tablet Take 2 tablets by mouth two times daily 10/24/14   Bobetta Lime, MD  gabapentin (NEURONTIN) 300 MG capsule Take 1 capsule by mouth two times daily 10/24/14   Bobetta Lime, MD  hydrOXYzine (VISTARIL) 25 MG capsule Take 1 capsule (25 mg total) by mouth daily as needed (for severe anxiety). Anxiety/agitation 05/20/18   Ursula Alert, MD  lamoTRIgine (LAMICTAL) 25 MG tablet Take 3 tablets (75 mg total) by mouth daily. Start 2 tablets for 2 weeks and then increase to 3 tablets 07/03/18   Ursula Alert, MD  levothyroxine (SYNTHROID, LEVOTHROID) 175 MCG tablet Take 1 tablet (175 mcg total) by mouth daily before breakfast. 12/13/15   Renato Shin, MD  loperamide (IMODIUM A-D) 2 MG tablet Take 1 tablet (2 mg total) by mouth 4 (four) times daily as needed for diarrhea or loose stools. 07/06/18   Cuthriell, Charline Bills, PA-C  omeprazole (PRILOSEC) 20 MG capsule Take 1 capsule (20 mg total) by mouth daily. 10/25/14   Bobetta Lime, MD  ondansetron (ZOFRAN-ODT) 4 MG disintegrating tablet Take 1 tablet (4 mg total) by mouth every 8 (eight) hours as needed for nausea or vomiting. 07/06/18   Cuthriell, Charline Bills, PA-C  PARoxetine (PAXIL) 40 MG tablet Take 1 tablet (40 mg total) by mouth every morning. 05/20/18   Ursula Alert, MD  SUMAtriptan (IMITREX) 100 MG tablet  05/06/18   [provider]  traZODone (DESYREL) 50 MG tablet TAKE 1 TABLET(50 MG) BY MOUTH AT BEDTIME FOR SLEEP 12/31/17   Ursula Alert, MD  vitamin E 400 UNIT capsule Take 400 Units by mouth daily.    [provider]    Allergies Amoxicillin and Penicillins  Family History  Problem Relation Age of Onset  . Migraines Mother   . Anxiety disorder Mother    . Depression Mother   . Cancer Father   . Hypertension Father   . Stroke Father   . Heart attack Father   . Alcohol abuse Father   . Diabetes Brother   . Alcohol abuse Sister   . Drug abuse Sister   . Anxiety disorder Sister   . Depression Sister   . Drug abuse Brother   . Cancer Paternal Aunt   . Lupus Paternal Aunt   . Thyroid disease Paternal Aunt   . Diabetes Maternal Grandfather   . Anxiety disorder Maternal Grandmother   . Depression Maternal Grandmother     Social History Social History   Tobacco Use  . Smoking status: Never Smoker  . Smokeless tobacco: Never Used  Substance Use Topics  . Alcohol use: No    Alcohol/week: 0.0 standard drinks  . Drug use: Never     Review of Systems  Constitutional: No fever/chills Eyes: No visual changes. No discharge  ENT: No upper respiratory complaints. Cardiovascular: no chest pain. Respiratory: no cough. No SOB. Gastrointestinal: Positive for pelvic pain, nausea, vomiting, diarrhea.  No constipation. Genitourinary: Negative for dysuria. No hematuria Musculoskeletal: Negative for musculoskeletal pain. Skin: Negative for rash, abrasions, lacerations, ecchymosis. Neurological: Negative for headaches, focal weakness or numbness. 10-point ROS otherwise negative.  ____________________________________________   PHYSICAL EXAM:  VITAL SIGNS: ED Triage Vitals  Enc Vitals Group     BP 07/06/18 1719 130/82     Pulse Rate 07/06/18 1719 87     Resp 07/06/18 1719 20     Temp 07/06/18 1719 98.1 F (36.7 C)     Temp Source 07/06/18 1719 Oral     SpO2 07/06/18 1719 100 %     Weight 07/06/18 1720 163 lb (73.9 kg)     Height 07/06/18 1720 5\' 7"  (1.702 m)     Head Circumference --      Peak Flow --      Pain Score 07/06/18 1720 0     Pain Loc --      Pain Edu? --      Excl. in [place]? --      Constitutional: Alert and oriented. Well appearing and in no acute distress. Eyes: Conjunctivae are normal. PERRL. EOMI. Head:  Atraumatic. Neck: No stridor.   Hematological/Lymphatic/Immunilogical: No cervical lymphadenopathy. Cardiovascular: Normal rate, regular rhythm. Normal S1 and S2.  Good peripheral circulation. Respiratory: Normal respiratory effort without tachypnea or retractions. Lungs CTAB. Good air entry to the bases with no decreased or absent breath sounds. Gastrointestinal: Bowel sounds 4 quadrants.  Soft to palpation with guarding.  Patient is very tender to palpation in the suprapubic/pelvic region.  Patient does guard in this area.  No guarding or rigidity. No palpable masses. No distention. No CVA tenderness. Musculoskeletal: Full range of motion to all extremities. No gross deformities appreciated. Neurologic:  Normal speech and language. No gross focal neurologic deficits are appreciated.  Skin:  Skin is warm, dry and intact. No rash noted. Psychiatric: Mood and affect are normal. Speech and behavior are normal. Patient exhibits appropriate insight and judgement.   ____________________________________________   LABS (all labs ordered are listed, but only abnormal results are displayed)  Labs Reviewed  COMPREHENSIVE METABOLIC PANEL - Abnormal; Notable for the following components:      Result Value   Glucose, Bld 110 (*)    All other components within normal limits  URINALYSIS, COMPLETE (UACMP) WITH MICROSCOPIC - Abnormal; Notable for the following components:   Color, Urine STRAW (*)    APPearance CLEAR (*)    Specific Gravity, Urine 1.004 (*)    Hgb urine dipstick MODERATE (*)    Leukocytes,Ua TRACE (*)    All other components within normal limits  CBC   ____________________________________________  EKG   ____________________________________________  RADIOLOGY I personally viewed and evaluated these images as part of my medical decision making, as well as reviewing the written report by the radiologist.  Ct Abdomen Pelvis W Contrast  Result Date: 07/06/2018 CLINICAL DATA:   Status post hysterectomy. EXAM: CT ABDOMEN AND PELVIS WITH CONTRAST TECHNIQUE: Multidetector CT imaging of the abdomen and pelvis was performed using the standard protocol following bolus administration of intravenous contrast. CONTRAST:  158mL OMNIPAQUE IOHEXOL 300 MG/ML  SOLN COMPARISON:  None. FINDINGS: Lower chest: No acute abnormality. Hepatobiliary: Low-attenuation structure in right lobe of liver is too small to characterize measuring 5 mm. Gallstone measures 9 mm. No gallbladder wall thickening or pericholecystic fluid. No intrahepatic bile  duct dilatation. Mild increased caliber of the CBD measures 8 mm in maximum dimension. No choledocholithiasis. Pancreas: Unremarkable. No pancreatic ductal dilatation or surrounding inflammatory changes. Spleen: Normal in size without focal abnormality. Adrenals/Urinary Tract: Normal appearance of the adrenal glands. Bilateral nephrolithiasis. The largest kidney stone is in the upper pole of left kidney measuring 7 mm. No hydronephrosis identified bilaterally. No hydroureter. The urinary bladder appears normal. Stomach/Bowel: Stomach normal. The small bowel loops have a normal course and caliber without evidence for bowel obstruction. The appendix is visualized and appears normal. Distal colonic diverticula. No acute inflammation. Vascular/Lymphatic: No significant vascular findings are present. No enlarged abdominal or pelvic lymph nodes. Reproductive: Status post hysterectomy. No adnexal masses. Other: No abdominal wall hernia or abnormality. No abdominopelvic ascites. Musculoskeletal: No acute or significant osseous findings. IMPRESSION: 1. No acute findings. 2. Bilateral nonobstructing renal calculi. 3. Gallstone. Electronically Signed   By: Kerby Moors M.D.   On: 07/06/2018 20:19    ____________________________________________    PROCEDURES  Procedure(s) performed:    Procedures    Medications  iohexol (OMNIPAQUE) 300 MG/ML solution 100 mL (100  mLs Intravenous Contrast Given 07/06/18 2000)     ____________________________________________   INITIAL IMPRESSION / ASSESSMENT AND PLAN / ED COURSE  Pertinent labs & imaging results that were available during my care of the patient were reviewed by me and considered in my medical decision making (see chart for details).  Review of the [place] CSRS was performed in accordance of the [place] prior to dispensing any controlled drugs.  Clinical Course as of Jul 06 2118  Mon Jul 06, 2018  2003 Patient presented to emergency department with 3-week history of intermittent abdominal pain, nausea vomiting and diarrhea.  Patient reports that symptoms began after starting her lamotrigine.  She called her primary care and was advised that this may be allergic reaction.  At this time, patient did have tenderness to palpation in the suprapubic region.  No other significant tenderness to palpation.  Given symptoms, differential includes medication side effect, urinary tract infection, colitis, diverticulitis, proctitis, ovarian cyst, ovarian torsion.  Given differential, patient will be evaluated with labs, CT scan of the abdomen and pelvis.   [JC]    Clinical Course User Index [JC] Cuthriell, Charline Bills, PA-C          Patient's diagnosis is consistent with medication side effect causing pelvic pain, nausea, vomiting, diarrhea.  Patient presented to the emergency department with complaint of intermittent symptoms x3 weeks after starting lamotrigine.  Patient called her primary care today concern for ongoing symptoms and was advised to present to the emergency department for allergic reaction.  I see no evidence of allergic reaction at this time.  With complaints of GI symptoms with associated tenderness to palpation in the pelvic region patient was evaluated with CT scan.  This returns with no acute findings to explain patient's symptoms.  At this time, I suspect that symptoms are most likely associated with  medication side effect as these began right after taking lamotrigine.  Patient is advised to call her psychiatrist in the morning to discuss other medications for her bipolar disorder.  Patient is given symptom relief medication of Zofran and loperamide.  Follow-up with primary care and psychiatrist as needed.  Patient is given ED precautions to return to the ED for any worsening or new symptoms.     ____________________________________________  FINAL CLINICAL IMPRESSION(S) / ED DIAGNOSES  Final diagnoses:  Pelvic pain  Medication side effect, initial encounter  Nausea vomiting and diarrhea      NEW MEDICATIONS STARTED DURING THIS VISIT:  ED Discharge Orders         Ordered    ondansetron (ZOFRAN-ODT) 4 MG disintegrating tablet  Every 8 hours PRN     07/06/18 2055    loperamide (IMODIUM A-D) 2 MG tablet  4 times daily PRN     07/06/18 2055              This chart was dictated using voice recognition software/Dragon. Despite best efforts to proofread, errors can occur which can change the meaning. Any change was purely unintentional.    Darletta Moll, PA-C 07/06/18 2121    Arta Silence, MD 07/06/18 2129

## 2018-07-06 NOTE — Telephone Encounter (Signed)
pt called states she thinks she is reacting to the lamotrigine. fever, nause , vomiting

## 2018-07-06 NOTE — Telephone Encounter (Signed)
thanks

## 2018-07-06 NOTE — Telephone Encounter (Signed)
spoke with pt per dr. Shea Evans order stop the lamotrigine and go to ED.

## 2018-07-06 NOTE — Telephone Encounter (Signed)
Left message for patient to stop medication and to go to nearest ED

## 2018-07-07 NOTE — Telephone Encounter (Signed)
pt went to er and had labwork and ct scan and states everything fine. was told to stop the lamtrigine and they but her on the zofran and imodium.

## 2018-07-07 NOTE — Telephone Encounter (Signed)
Ok thanks 

## 2018-07-17 ENCOUNTER — Other Ambulatory Visit: Payer: Self-pay

## 2018-07-17 ENCOUNTER — Ambulatory Visit (INDEPENDENT_AMBULATORY_CARE_PROVIDER_SITE_OTHER): Payer: 59 | Admitting: Psychiatry

## 2018-07-17 ENCOUNTER — Encounter: Payer: Self-pay | Admitting: Psychiatry

## 2018-07-17 DIAGNOSIS — G2111 Neuroleptic induced parkinsonism: Secondary | ICD-10-CM | POA: Diagnosis not present

## 2018-07-17 DIAGNOSIS — F411 Generalized anxiety disorder: Secondary | ICD-10-CM | POA: Diagnosis not present

## 2018-07-17 DIAGNOSIS — F09 Unspecified mental disorder due to known physiological condition: Secondary | ICD-10-CM

## 2018-07-17 DIAGNOSIS — F5105 Insomnia due to other mental disorder: Secondary | ICD-10-CM

## 2018-07-17 DIAGNOSIS — F3181 Bipolar II disorder: Secondary | ICD-10-CM | POA: Diagnosis not present

## 2018-07-17 MED ORDER — QUETIAPINE FUMARATE 25 MG PO TABS: 12.5000 mg | ORAL_TABLET | Freq: Every day | ORAL | 0 refills | Status: DC

## 2018-07-17 MED ORDER — BUSPIRONE HCL 10 MG PO TABS: 10.0000 mg | ORAL_TABLET | Freq: Two times a day (BID) | ORAL | 1 refills | Status: DC

## 2018-07-17 NOTE — Progress Notes (Signed)
Virtual Visit via Video Note  I connected with Stacy [name] on 07/17/18 at 11:15 AM EDT by a video enabled telemedicine application and verified that I am speaking with the correct person using two identifiers.   I discussed the limitations of evaluation and management by telemedicine and the availability of in person appointments. The patient expressed understanding and agreed to proceed.   I discussed the assessment and treatment plan with the patient. The patient was provided an opportunity to ask questions and all were answered. The patient agreed with the plan and demonstrated an understanding of the instructions.   The patient was advised to call back or seek an in-person evaluation if the symptoms worsen or if the condition fails to improve as anticipated.   [facility] MD OP Progress Note  07/17/2018 12:45 PM [name]  MRN:  [id number]  Chief Complaint:  Chief Complaint    Follow-up     HPI: Stacy [name] is a 58 year old Caucasian female, unemployed, married, lives in [place], has a history of bipolar disorder type II, GAD, insomnia, mild cognitive disorder, neuroleptic induced parkinsonian some, Sjogren's syndrome, hypertension, arthritis, hypothyroidism, migraine headache was evaluated by telemedicine today.  Patient today reports she had to stop taking the Lamictal.  She went to the emergency department on 07/06/2018.  At that time had nausea, vomiting and diarrhea which was intermittent for at least 3 weeks.  Her symptoms worsened and she hence had to go to the ED.  Patient was advised to stop taking the Lamictal which could have contributed to that.  Patient reports ever since stopping Lamictal her symptoms have resolved.  I have reviewed medical records in [facility] per ED physician Arta Silence - " patient reported intermittent waves of 3 to 4 days of symptoms followed by several days being asymptomatic before symptoms return.  She reports stabbing sensation in her lower  pelvis region as well as nausea, vomiting and diarrhea.  Patient had CT abdomen pelvis with contrast done- no acute pathology.  Patient was advised to stop Lamictal.  She was also given Zofran and loperamide for symptomatic relief.'  Patient today reports ever since stopping the Lamictal she has been feeling more and more depressed.  She also reports anxiety symptoms like restlessness, being fidgety, worrying too much about different things.  She has been spending a lot of time indoors due to the COVID-19 outbreak which is also contributing to her mood symptoms worsening.  Patient also reports she has not been taking the trazodone.  She hence continues to struggle with sleep problems.  Discuss starting Seroquel.  Provided her medication education including sedation, abnormal involuntary movements.  We will start her on a low dose of 12.5 mg at bedtime.  Patient continues to have some movements around her mouth which are intermittent, this began with [place] and [place] has been discontinued.  Will monitor closely.  She denies any suicidality, homicidality or perceptual disturbances.   Visit Diagnosis:    ICD-10-CM   1. Bipolar 2 disorder (HCC) F31.81 busPIRone (BUSPAR) 10 MG tablet    QUEtiapine (SEROQUEL) 25 MG tablet   mixed,mild  2. GAD (generalized anxiety disorder) F41.1   3. Insomnia due to mental condition F51.05 QUEtiapine (SEROQUEL) 25 MG tablet  4. 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 Mild cognitive disorder F09     Past Psychiatric History: I have reviewed past psychiatric history from my progress note on 12/10/2017.  Past trials of Paxil, Wellbutrin, anxiety, Latuda  Past Medical History:  Past  Medical History:  Diagnosis Date  . Aneurysm ([place])   . Anxiety   . Arthritis   . Brain stem lesion   . Chronic kidney disease    kideny stone  . Depression   . Fibromyalgia   . GERD (gastroesophageal reflux disease)   . Headache   . Hypotension   . Hypothyroid    . Memory loss   . Migraine   . Raynaud disease   . Sjogren's syndrome ([place])   . Thyroid disease     Past Surgical History:  Procedure Laterality Date  . ABDOMINAL HYSTERECTOMY    . BASAL CELL CARCINOMA EXCISION    . BREAST BIOPSY    . CESAREAN SECTION    . EYE SURGERY    . KNEE SURGERY Left   . LITHOTRIPSY    . MUSCLE BIOPSY    . OOPHORECTOMY      Family Psychiatric History: I have reviewed family psychiatric history from my progress note on 12/10/2017.  Family History:  Family History  Problem Relation Age of Onset  . Migraines Mother   . Anxiety disorder Mother   . Depression Mother   . Cancer Father   . Hypertension Father   . Stroke Father   . Heart attack Father   . Alcohol abuse Father   . Diabetes Brother   . Alcohol abuse Sister   . Drug abuse Sister   . Anxiety disorder Sister   . Depression Sister   . Drug abuse Brother   . Cancer Paternal Aunt   . Lupus Paternal Aunt   . Thyroid disease Paternal Aunt   . Diabetes Maternal Grandfather   . Anxiety disorder Maternal Grandmother   . Depression Maternal Grandmother     Social History: Reviewed social history from my progress note on 12/10/2017. Social History   Socioeconomic History  . Marital status: Married    Spouse name: [name]  . Number of children: 2  . Years of education: 86  . Highest education level: Associate degree: occupational, Hotel manager, or vocational program  Occupational History  . Occupation: Disabled  Social Needs  . Financial resource strain: Not hard at all  . Food insecurity:    Worry: Never true    Inability: Never true  . Transportation needs:    Medical: No    Non-medical: No  Tobacco Use  . Smoking status: Never Smoker  . Smokeless tobacco: Never Used  Substance and Sexual Activity  . Alcohol use: No    Alcohol/week: 0.0 standard drinks  . Drug use: Never  . Sexual activity: Yes    Birth control/protection: None  Lifestyle  . Physical activity:    Days per  week: 7 days    Minutes per session: 30 min  . Stress: Very much  Relationships  . Social connections:    Talks on phone: Not on file    Gets together: Not on file    Attends religious service: More than 4 times per year    Active member of club or organization: No    Attends meetings of clubs or organizations: Never    Relationship status: Married  Other Topics Concern  . Not on file  Social History Narrative   Lives at home with husband. [name]).    Disabled.   Education college.   Right handed.   2 cups caffeine/daily    Allergies:  Allergies  Allergen Reactions  . Amoxicillin Hives  . Penicillins Hives    Metabolic Disorder Labs: Lab Results  Component Value Date   HGBA1C 5.0 11/13/2015   No results found for: PROLACTIN No results found for: CHOL, TRIG, HDL, CHOLHDL, VLDL, LDLCALC Lab Results  Component Value Date   TSH 1.780 10/26/2015   TSH 0.525 03/15/2015    Therapeutic Level Labs: No results found for: LITHIUM No results found for: VALPROATE No components found for:  CBMZ  Current Medications: Current Outpatient Medications  Medication Sig Dispense Refill  . alendronate (FOSAMAX) 70 MG tablet     . benztropine (COGENTIN) 1 MG tablet Take 1 tablet (1 mg total) by mouth 2 (two) times daily. 180 tablet 0  . busPIRone (BUSPAR) 10 MG tablet Take 1 tablet (10 mg total) by mouth 2 (two) times daily. 60 tablet 1  . Calcium-Vitamin D 600-200 MG-UNIT per tablet Take by mouth.    . Cholecalciferol (VITAMIN D-3) 1000 units CAPS Take by mouth daily.    . Cholecalciferol 1000 UNITS tablet Take by mouth.    . Cranberry-Vitamin C-Vitamin E (CRANBERRY/VITAMIN C TRIPLE ST PO) Take by mouth.    . DEXILANT 60 MG capsule     . donepezil (ARICEPT) 10 MG tablet Take 1 tablet (10 mg total) by mouth at bedtime. 90 tablet 4  . famotidine (PEPCID) 40 MG tablet     . fludrocortisone (FLORINEF) 0.1 MG tablet Take 2 tablets by mouth two times daily 360 tablet 2  . gabapentin  (NEURONTIN) 300 MG capsule Take 1 capsule by mouth two times daily 180 capsule 2  . hydrOXYzine (VISTARIL) 25 MG capsule Take 1 capsule (25 mg total) by mouth daily as needed (for severe anxiety). Anxiety/agitation 90 capsule 0  . levothyroxine (SYNTHROID, LEVOTHROID) 175 MCG tablet Take 1 tablet (175 mcg total) by mouth daily before breakfast. 90 tablet 3  . loperamide (IMODIUM A-D) 2 MG tablet Take 1 tablet (2 mg total) by mouth 4 (four) times daily as needed for diarrhea or loose stools. 30 tablet 0  . omeprazole (PRILOSEC) 20 MG capsule Take 1 capsule (20 mg total) by mouth daily. 180 capsule 3  . ondansetron (ZOFRAN-ODT) 4 MG disintegrating tablet Take 1 tablet (4 mg total) by mouth every 8 (eight) hours as needed for nausea or vomiting. 20 tablet 0  . PARoxetine (PAXIL) 40 MG tablet Take 1 tablet (40 mg total) by mouth every morning. 90 tablet 0  . QUEtiapine (SEROQUEL) 25 MG tablet Take 0.5 tablets (12.5 mg total) by mouth at bedtime. 15 tablet 0  . SUMAtriptan (IMITREX) 100 MG tablet     . vitamin E 400 UNIT capsule Take 400 Units by mouth daily.     No current facility-administered medications for this visit.      Musculoskeletal: Strength & Muscle Tone: within normal limits Gait & Station: normal Patient leans: N/A  Psychiatric Specialty Exam: Review of Systems  Psychiatric/Behavioral: Positive for depression. The patient is nervous/anxious and has insomnia.   All other systems reviewed and are negative.   There were no vitals taken for this visit.There is no height or weight on file to calculate BMI.  General Appearance: Casual  Eye Contact:  Fair  Speech:  Clear and Coherent  Volume:  Normal  Mood:  Anxious and Depressed  Affect:  Congruent  Thought Process:  Goal Directed and Descriptions of Associations: Intact  Orientation:  Full (Time, Place, and Person)  Thought Content: Logical   Suicidal Thoughts:  No  Homicidal Thoughts:  No  Memory:  Immediate;    Fair Recent;   Fair Remote;  Fair  Judgement:  Fair  Insight:  Fair  Psychomotor Activity:  Normal  Concentration:  Concentration: Fair and Attention Span: Fair  Recall:  [employer] of Knowledge: Fair  Language: Fair  Akathisia:  No  Handed:  Right  AIMS (if indicated): Reports some movements of her teeth and jaw.  Assets:  Communication Skills Desire for Improvement Social Support  ADL's:  Intact  Cognition: WNL  Sleep:  Poor   Screenings: AIMS     Office Visit from 06/15/2018 in [facility] Office Visit from 05/20/2018 in [facility] Total Score  5  9    Mini-Mental     Office Visit from 01/01/2016 in [facility] Office Visit from 11/13/2015 in [facility] Office Visit from 10/26/2015 in [facility] Office Visit from 04/25/2015 in [facility] Office Visit from 10/25/2014 in [facility]  Total Score (max 30 points )  25  26  21  23  29     PHQ2-9     Office Visit from 02/08/2015 in [facility] Office Visit from 10/07/2014 in [facility]  PHQ-2 Total Score  0  4  PHQ-9 Total Score  -  8       Assessment and Plan: Latayna is a 58 year old Caucasian female who has a history of bipolar disorder type II, neuroleptic induced parkinsonism, insomnia, cognitive disorder, Sjogren's syndrome, hypothyroidism, hypertension, arthritis, fibromyalgia was evaluated by telemedicine today.  Patient continues to struggle with mood lability and sleep problems.  She also had recent side effects to her medications requiring emergency department visit.  Discussed plan as noted below.  Plan Bipolar disorder type II-unstable Discontinue Lamictal for side effects Paxil 40 mg p.o. daily Hydroxyzine 25 mg p.o. daily PRN for severe anxiety attacks BuSpar 10 mg p.o. twice daily Start Seroquel 12.5 mg p.o.  nightly.   For GAD-unstable BuSpar as prescribed Seroquel 12.5 mg p.o. nightly Continue CBT  For insomnia- restless Discontinue trazodone. Seroquel will also help.  For neuroleptic induced movement disorder-improving Patient reports she continues to have some movements around her mouth although improving.  We will continue to monitor closely.  Patient advised to continue psychotherapy sessions with Ms. Alden Hipp.  I have reviewed medical records from her most recent ED visit-dated 07/06/2018-per MD Arta Silence as summarized above.  Will also coordinate care with her therapist Ms. Alden Hipp to discuss if she needs more frequent psychotherapy sessions or an intensive outpatient program referral.  Follow-up in clinic in 2 weeks or sooner if needed.  Appointment scheduled for June 3 at 9:15 AM  I have spent atleast 25 minutes non face to face with patient today. More than 50 % of the time was spent for psychoeducation and supportive psychotherapy and care coordination.  This note was generated in part or whole with voice recognition software. Voice recognition is usually quite accurate but there are transcription errors that can and very often do occur. I apologize for any typographical errors that were not detected and corrected.       Ursula Alert, MD 07/17/2018, 12:45 PM

## 2018-07-21 ENCOUNTER — Other Ambulatory Visit: Payer: Self-pay | Admitting: Psychiatry

## 2018-07-21 DIAGNOSIS — F3181 Bipolar II disorder: Secondary | ICD-10-CM

## 2018-07-29 ENCOUNTER — Ambulatory Visit (INDEPENDENT_AMBULATORY_CARE_PROVIDER_SITE_OTHER): Payer: 59 | Admitting: Licensed Clinical Social Worker

## 2018-07-29 ENCOUNTER — Encounter: Payer: Self-pay | Admitting: Licensed Clinical Social Worker

## 2018-07-29 ENCOUNTER — Other Ambulatory Visit: Payer: Self-pay

## 2018-07-29 DIAGNOSIS — F411 Generalized anxiety disorder: Secondary | ICD-10-CM

## 2018-07-29 DIAGNOSIS — F3181 Bipolar II disorder: Secondary | ICD-10-CM

## 2018-07-29 NOTE — Progress Notes (Signed)
Virtual Visit via Telephone Note  I connected with Stacy [name] on 07/29/18 at  9:00 AM EDT by telephone and verified that I am speaking with the correct person using two identifiers.   I discussed the limitations, risks, security and privacy concerns of performing an evaluation and management service by telephone and the availability of in person appointments. I also discussed with the patient that there may be a patient responsible charge related to this service. The patient expressed understanding and agreed to proceed.  I discussed the assessment and treatment plan with the patient. The patient was provided an opportunity to ask questions and all were answered. The patient agreed with the plan and demonstrated an understanding of the instructions.   The patient was advised to call back or seek an in-person evaluation if the symptoms worsen or if the condition fails to improve as anticipated.  I provided 20 minutes of non-face-to-face time during this encounter.   Stacy Hipp, LCSW    THERAPIST PROGRESS NOTE  Session Time: 0900  Participation Level: Minimal  Behavioral Response: NeatAlertAnxious  Type of Therapy: Individual Therapy  Treatment Goals addressed: Anxiety  Interventions: CBT  Summary: Stacy [name] is a 58 y.o. female who presents with continued symptoms of her diagnosis. Stacy [name] reported doing, "okay," since our last session. She reported changing medications due to "some really bad side effects." She reported going to the ER due to nausea, vomiting, etc. Additionally, she reports she was experiencing visual hallucinations during the night, "I would wake up and see people in the hallway but I knew they weren't really there." Because of this, MD in the clinic changed Stacy [name]'s medications to address these new symptoms associated with medications. Stacy [name] reported her negative side effects have stopped since changing medications. LCSW validated Stacy [name]'s  feelings around this incident and highlighted the way she was able to advocate for herself by discussing these issues with her MD. Stacy [name] was able to recognize this as well. She reported, other than the issues around medication, she has not had any events that have increased her anxiety. LCSW asked Stacy [name] if she'd been utilizing the thought challenging strategies we discussed. She stated she has but was unable to give examples and stated, "it doesn't work at times." LCSW explained, again, that this is one facet of CBT and it works to retrain the way you think in order to make it second nature. Stacy [name] expressed agreement again. LCSW suggested Stacy [name] continue attempting to utilize thought challenging techniques.    Suicidal/Homicidal: No  Therapist Response: Stacy [name] continues to work towards her tx goals but has not yet reached them. We will continue to work towards managing anxiety symptoms utilizing CBT techniques moving forward.   Plan: Return again in 8 weeks.  Diagnosis: Axis I: Bipolar 2 Disorder    Axis II: No diagnosis    Stacy Hipp, LCSW 07/29/2018

## 2018-08-11 ENCOUNTER — Other Ambulatory Visit: Payer: Self-pay | Admitting: Psychiatry

## 2018-08-11 DIAGNOSIS — F3181 Bipolar II disorder: Secondary | ICD-10-CM

## 2018-08-11 DIAGNOSIS — F5105 Insomnia due to other mental disorder: Secondary | ICD-10-CM

## 2018-09-08 ENCOUNTER — Other Ambulatory Visit: Payer: Self-pay | Admitting: Psychiatry

## 2018-09-08 DIAGNOSIS — F3181 Bipolar II disorder: Secondary | ICD-10-CM

## 2018-09-08 DIAGNOSIS — F5105 Insomnia due to other mental disorder: Secondary | ICD-10-CM

## 2018-09-16 ENCOUNTER — Other Ambulatory Visit (HOSPITAL_COMMUNITY): Payer: Self-pay | Admitting: Psychiatry

## 2018-09-16 ENCOUNTER — Telehealth: Payer: Self-pay

## 2018-09-16 DIAGNOSIS — F3181 Bipolar II disorder: Secondary | ICD-10-CM

## 2018-09-16 DIAGNOSIS — F5105 Insomnia due to other mental disorder: Secondary | ICD-10-CM

## 2018-09-16 MED ORDER — QUETIAPINE FUMARATE 25 MG PO TABS: 12.5000 mg | ORAL_TABLET | Freq: Every day | ORAL | 0 refills | Status: DC

## 2018-09-16 NOTE — Telephone Encounter (Signed)
This is a patient of Dr. Shea Evans. Patient called requesting a refill on her Quetiapine 25mg . She has a scheduled appointment for 09/25/2018 and is using [facility] on corner of [address]. Please review and advise. Thank you.

## 2018-09-16 NOTE — Telephone Encounter (Signed)
ordered

## 2018-09-25 ENCOUNTER — Other Ambulatory Visit: Payer: Self-pay

## 2018-09-25 ENCOUNTER — Ambulatory Visit (INDEPENDENT_AMBULATORY_CARE_PROVIDER_SITE_OTHER): Payer: 59 | Admitting: Psychiatry

## 2018-09-25 ENCOUNTER — Encounter: Payer: Self-pay | Admitting: Psychiatry

## 2018-09-25 DIAGNOSIS — F5105 Insomnia due to other mental disorder: Secondary | ICD-10-CM

## 2018-09-25 DIAGNOSIS — F3175 Bipolar disorder, in partial remission, most recent episode depressed: Secondary | ICD-10-CM | POA: Insufficient documentation

## 2018-09-25 DIAGNOSIS — G2111 Neuroleptic induced parkinsonism: Secondary | ICD-10-CM | POA: Diagnosis not present

## 2018-09-25 DIAGNOSIS — F411 Generalized anxiety disorder: Secondary | ICD-10-CM | POA: Diagnosis not present

## 2018-09-25 DIAGNOSIS — F3181 Bipolar II disorder: Secondary | ICD-10-CM | POA: Diagnosis not present

## 2018-09-25 MED ORDER — QUETIAPINE FUMARATE 25 MG PO TABS: 37.5000 mg | ORAL_TABLET | Freq: Every day | ORAL | 1 refills | Status: DC

## 2018-09-25 MED ORDER — PAROXETINE HCL 40 MG PO TABS: 40.0000 mg | ORAL_TABLET | Freq: Every morning | ORAL | 0 refills | Status: DC

## 2018-09-25 MED ORDER — HYDROXYZINE PAMOATE 25 MG PO CAPS: 25.0000 mg | ORAL_CAPSULE | Freq: Every day | ORAL | 0 refills | Status: DC | PRN

## 2018-09-25 MED ORDER — BUSPIRONE HCL 10 MG PO TABS: 10.0000 mg | ORAL_TABLET | Freq: Two times a day (BID) | ORAL | 1 refills | Status: DC

## 2018-09-25 NOTE — Progress Notes (Signed)
Virtual Visit via Video Note  I connected with Stacy [name] on 09/25/18 at  9:15 AM EDT by a video enabled telemedicine application and verified that I am speaking with the correct person using two identifiers.   I discussed the limitations of evaluation and management by telemedicine and the availability of in person appointments. The patient expressed understanding and agreed to proceed.     I discussed the assessment and treatment plan with the patient. The patient was provided an opportunity to ask questions and all were answered. The patient agreed with the plan and demonstrated an understanding of the instructions.   The patient was advised to call back or seek an in-person evaluation if the symptoms worsen or if the condition fails to improve as anticipated. Pence MD OP Progress Note  09/25/2018 1:08 PM Marionna Gonia  MRN:  [id number]  Chief Complaint:  Chief Complaint    Follow-up     HPI: Stacy [name] is a 58 year old Caucasian female, unemployed, married, lives in [place], has a history of bipolar disorder, GAD, insomnia, mild cognitive disorder, neuroleptic induced Parkinson's, Sjogren's syndrome, hypertension, hypothyroidism, arthritis, migraine headache was evaluated by telemedicine today.  Patient today reports she is currently dealing with some psychosocial stressors.  Her 38 year old grandson recently got diagnosed with brain cancer.  She reports it is a very rare cancer and hence they are not really sure how it is going to end up.  Patient reports he is currently undergoing treatment and had surgery to remove it.  Patient reports she hence is anxious and sad about the fact.  Patient reports she does have an upcoming appointment with Ms. Alden Hipp.  She however reports she will call in to find if there is a sooner appointment.   Patient reports Seroquel is working.  She is tolerating the lower dosage.  Discussed increasing her Seroquel and she agrees with  plan.  Patient reports her husband continues to be supportive.  She denies any suicidality, homicidality or perceptual disturbances. Visit Diagnosis:    ICD-10-CM   1. Bipolar 2 disorder (HCC)  F31.81 PARoxetine (PAXIL) 40 MG tablet    hydrOXYzine (VISTARIL) 25 MG capsule   mixed, mild  2. GAD (generalized anxiety disorder)  F41.1   3. Insomnia due to mental disorder  F51.05 QUEtiapine (SEROQUEL) 25 MG tablet    busPIRone (BUSPAR) 10 MG tablet   mixed,mild  4. 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  Past trials of Paxil, Wellbutrin, Latuda.  Past Medical History:  Past Medical History:  Diagnosis Date  . Aneurysm ([place])   . Anxiety   . Arthritis   . Brain stem lesion   . Chronic kidney disease    kideny stone  . Depression   . Fibromyalgia   . GERD (gastroesophageal reflux disease)   . Headache   . Hypotension   . Hypothyroid   . Memory loss   . Migraine   . Raynaud disease   . Sjogren's syndrome ([place])   . Thyroid disease     Past Surgical History:  Procedure Laterality Date  . ABDOMINAL HYSTERECTOMY    . BASAL CELL CARCINOMA EXCISION    . BREAST BIOPSY    . CESAREAN SECTION    . EYE SURGERY    . KNEE SURGERY Left   . LITHOTRIPSY    . MUSCLE BIOPSY    . OOPHORECTOMY  Family Psychiatric History: I have reviewed family psychiatric history from my progress note on 12/10/2017.  Family History:  Family History  Problem Relation Age of Onset  . Migraines Mother   . Anxiety disorder Mother   . Depression Mother   . Cancer Father   . Hypertension Father   . Stroke Father   . Heart attack Father   . Alcohol abuse Father   . Diabetes Brother   . Alcohol abuse Sister   . Drug abuse Sister   . Anxiety disorder Sister   . Depression Sister   . Drug abuse Brother   . Cancer Paternal Aunt   . Lupus Paternal Aunt   . Thyroid disease Paternal  Aunt   . Diabetes Maternal Grandfather   . Anxiety disorder Maternal Grandmother   . Depression Maternal Grandmother     Social History: I have reviewed social history from my progress note on 12/10/2017. Social History   Socioeconomic History  . Marital status: Married    Spouse name: [name]  . Number of children: 2  . Years of education: 23  . Highest education level: Associate degree: occupational, Hotel manager, or vocational program  Occupational History  . Occupation: Disabled  Social Needs  . Financial resource strain: Not hard at all  . Food insecurity    Worry: Never true    Inability: Never true  . Transportation needs    Medical: No    Non-medical: No  Tobacco Use  . Smoking status: Never Smoker  . Smokeless tobacco: Never Used  Substance and Sexual Activity  . Alcohol use: No    Alcohol/week: 0.0 standard drinks  . Drug use: Never  . Sexual activity: Yes    Birth control/protection: None  Lifestyle  . Physical activity    Days per week: 7 days    Minutes per session: 30 min  . Stress: Very much  Relationships  . Social Herbalist on phone: Not on file    Gets together: Not on file    Attends religious service: More than 4 times per year    Active member of club or organization: No    Attends meetings of clubs or organizations: Never    Relationship status: Married  Other Topics Concern  . Not on file  Social History Narrative   Lives at home with husband. [name]).    Disabled.   Education college.   Right handed.   2 cups caffeine/daily    Allergies:  Allergies  Allergen Reactions  . Amoxicillin Hives  . Penicillins Hives    Metabolic Disorder Labs: Lab Results  Component Value Date   HGBA1C 5.0 11/13/2015   No results found for: PROLACTIN No results found for: CHOL, TRIG, HDL, CHOLHDL, VLDL, LDLCALC Lab Results  Component Value Date   TSH 1.780 10/26/2015   TSH 0.525 03/15/2015    Therapeutic Level Labs: No results  found for: LITHIUM No results found for: VALPROATE No components found for:  CBMZ  Current Medications: Current Outpatient Medications  Medication Sig Dispense Refill  . donepezil (ARICEPT) 10 MG tablet Take by mouth.    Marland Kitchen alendronate (FOSAMAX) 70 MG tablet     . benztropine (COGENTIN) 1 MG tablet Take 1 tablet (1 mg total) by mouth 2 (two) times daily. 180 tablet 0  . busPIRone (BUSPAR) 10 MG tablet Take 1 tablet (10 mg total) by mouth 2 (two) times daily. 60 tablet 1  . Calcium-Vitamin D 600-200 MG-UNIT per tablet Take by  mouth.    . Cholecalciferol (VITAMIN D-3) 1000 units CAPS Take by mouth daily.    . Cholecalciferol 1000 UNITS tablet Take by mouth.    . Cranberry-Vitamin C-Vitamin E (CRANBERRY/VITAMIN C TRIPLE ST PO) Take by mouth.    . DEXILANT 60 MG capsule     . donepezil (ARICEPT) 10 MG tablet Take 1 tablet (10 mg total) by mouth at bedtime. 90 tablet 4  . famotidine (PEPCID) 40 MG tablet     . fludrocortisone (FLORINEF) 0.1 MG tablet Take 2 tablets by mouth two times daily 360 tablet 2  . gabapentin (NEURONTIN) 300 MG capsule Take 1 capsule by mouth two times daily 180 capsule 2  . hydrOXYzine (VISTARIL) 25 MG capsule Take 1 capsule (25 mg total) by mouth daily as needed (for severe anxiety). Anxiety/agitation 90 capsule 0  . levothyroxine (SYNTHROID, LEVOTHROID) 175 MCG tablet Take 1 tablet (175 mcg total) by mouth daily before breakfast. 90 tablet 3  . loperamide (IMODIUM A-D) 2 MG tablet Take 1 tablet (2 mg total) by mouth 4 (four) times daily as needed for diarrhea or loose stools. 30 tablet 0  . omeprazole (PRILOSEC) 20 MG capsule Take 1 capsule (20 mg total) by mouth daily. 180 capsule 3  . ondansetron (ZOFRAN-ODT) 4 MG disintegrating tablet Take 1 tablet (4 mg total) by mouth every 8 (eight) hours as needed for nausea or vomiting. 20 tablet 0  . PARoxetine (PAXIL) 40 MG tablet Take 1 tablet (40 mg total) by mouth every morning. 90 tablet 0  . QUEtiapine (SEROQUEL) 25 MG  tablet Take 1.5 tablets (37.5 mg total) by mouth at bedtime. Start taking 1 tablet for 2 weeks and increase to 1.5 tablets 45 tablet 1  . SUMAtriptan (IMITREX) 100 MG tablet     . vitamin E 400 UNIT capsule Take 400 Units by mouth daily.     No current facility-administered medications for this visit.      Musculoskeletal: Strength & Muscle Tone: UTA Gait & Station: normal Patient leans: N/A  Psychiatric Specialty Exam: Review of Systems  Psychiatric/Behavioral: The patient is nervous/anxious.   All other systems reviewed and are negative.   There were no vitals taken for this visit.There is no height or weight on file to calculate BMI.  General Appearance: Casual  Eye Contact:  Fair  Speech:  Clear and Coherent  Volume:  Normal  Mood:  Anxious  Affect:  Appropriate  Thought Process:  Goal Directed and Descriptions of Associations: Intact  Orientation:  Full (Time, Place, and Person)  Thought Content: Logical   Suicidal Thoughts:  No  Homicidal Thoughts:  No  Memory:  Immediate;   Fair Recent;   Fair Remote;   Fair  Judgement:  Fair  Insight:  Fair  Psychomotor Activity:  Normal  Concentration:  Concentration: Fair and Attention Span: Fair  Recall:  [employer] of Knowledge: Fair  Language: Fair  Akathisia:  No  Handed:  Right  AIMS (if indicated): Does have some movements around her lips, but improved  Assets:  Communication Skills Desire for Improvement Financial Resources/Insurance Housing Intimacy Social Support  ADL's:  Intact  Cognition: WNL  Sleep:  Fair   Screenings: AIMS     Office Visit from 06/15/2018 in [facility] Office Visit from 05/20/2018 in [facility] Total Score  5  9    Mini-Mental     Office Visit from 01/01/2016 in [facility] Office Visit from 11/13/2015  in [facility] Office Visit from 10/26/2015 in [facility] Office  Visit from 04/25/2015 in [facility] Office Visit from 10/25/2014 in [facility]  Total Score (max 30 points )  25  26  21  23  29     PHQ2-9     Office Visit from 02/08/2015 in [facility] Office Visit from 10/07/2014 in [facility]  PHQ-2 Total Score  0  4  PHQ-9 Total Score  -  8       Assessment and Plan: Stacy [name] is a 58 year old Caucasian female who has a history of bipolar disorder type II, neuroleptic induced Parkinsonism, insomnia, cognitive disorder, Sjogren's syndrome, hypothyroidism, fibromyalgia, was evaluated by telemedicine today.  Patient continues to struggle with psychosocial stressors of her grandson's recent cancer diagnosis.  She will has benefit from medication readjustment for her mood as well as more frequent psychotherapy sessions.  Plan For bipolar disorder type II-unstable Increase Seroquel to 25 mg p.o. nightly for 1 week and increase to 37.5 mg after that. Paxil 40 mg p.o. daily Hydroxyzine 25 mg p.o. daily as needed for anxiety attacks BuSpar 10 mg p.o. twice daily.  For GAD-unstable Discussed with patient to make a sooner appointment with Alden Hipp. Continue BuSpar and Seroquel as prescribed  For insomnia-improving Seroquel as prescribed.  For neuroleptic induced movement disorder-improving We will continue to monitor closely.  She reports movements around her mouth as improved.  Follow-up in clinic in 4 weeks or sooner if needed.  September 11 at 8:30 AM  I have spent atleast 15 minutes non face to face with patient today. More than 50 % of the time was spent for psychoeducation and supportive psychotherapy and care coordination.  This note was generated in part or whole with voice recognition software. Voice recognition is usually quite accurate but there are transcription errors that can and very often do occur. I apologize for any typographical errors that were not  detected and corrected.        Ursula Alert, MD 09/25/2018, 1:08 PM

## 2018-09-28 ENCOUNTER — Ambulatory Visit (INDEPENDENT_AMBULATORY_CARE_PROVIDER_SITE_OTHER): Payer: 59 | Admitting: Licensed Clinical Social Worker

## 2018-09-28 ENCOUNTER — Encounter: Payer: Self-pay | Admitting: Licensed Clinical Social Worker

## 2018-09-28 ENCOUNTER — Other Ambulatory Visit: Payer: Self-pay

## 2018-09-28 DIAGNOSIS — F3181 Bipolar II disorder: Secondary | ICD-10-CM | POA: Diagnosis not present

## 2018-09-28 NOTE — Progress Notes (Signed)
Virtual Visit via Video Note  I connected with Stacy [name] on 09/28/18 at  9:00 AM EDT by a video enabled telemedicine application and verified that I am speaking with the correct person using two identifiers.   I discussed the limitations of evaluation and management by telemedicine and the availability of in person appointments. The patient expressed understanding and agreed to proceed.   I discussed the assessment and treatment plan with the patient. The patient was provided an opportunity to ask questions and all were answered. The patient agreed with the plan and demonstrated an understanding of the instructions.   The patient was advised to call back or seek an in-person evaluation if the symptoms worsen or if the condition fails to improve as anticipated.  I provided 30 minutes of non-face-to-face time during this encounter.   Alden Hipp, LCSW    THERAPIST PROGRESS NOTE  Session Time: 0900  Participation Level: Minimal  Behavioral Response: CasualAlertAnxious  Type of Therapy: Individual Therapy  Treatment Goals addressed: Anxiety  Interventions: CBT  Summary: Stacy [name] is a 58 y.o. female who presents with continued symptoms related to her diagnosis. Stacy [name] reports doing well since our last session, but reports recently feeling overwhelmed. She reports her grandson was diagnosed with a brain tumor and recently had surgery to remove it. She reports feeling overwhelmed by not being able to be with her family in [place], and not knowing how to help. LCSW validated and normalized Stacy [name]'s feelings, and highlighted that her response is very reasonable. LCSW encouraged Stacy [name] to allow herself to feel whatever feelings come up, but also to begin writing things down to get thoughts out of her head and onto paper. LCSW also suggested making lists dictating what she wants to do to help, how she can achieve that, etc. Stacy [name] expressed understanding and agreement with  this idea and reported her sister recently sent her a journal which she could utilize. LCSW also encouraged Stacy [name] to focus on what she can do versus what she cannot in the situation--for instance, she cannot go visit, but she can check in via text/phone, she can facetime, she can send care packages, etc. Stacy [name] was able to understand this idea and expressed agreement.   Suicidal/Homicidal: No  Therapist Response: Stacy [name] continues to work towards her tx goals but has not yet reached them. We will continue to work on emotional regulation skills and challenging negative thoughts via CBT moving forward.   Plan: Return again in 4 weeks.  Diagnosis: Axis I: Bipolar 2 Disorder    Axis II: No diagnosis    Alden Hipp, LCSW 09/28/2018

## 2018-10-29 ENCOUNTER — Other Ambulatory Visit: Payer: Self-pay

## 2018-10-29 ENCOUNTER — Ambulatory Visit: Payer: Medicare Other | Admitting: Licensed Clinical Social Worker

## 2018-11-06 ENCOUNTER — Ambulatory Visit: Payer: Medicare Other | Admitting: Psychiatry

## 2018-11-06 ENCOUNTER — Other Ambulatory Visit: Payer: Self-pay

## 2019-05-31 ENCOUNTER — Ambulatory Visit: Payer: Medicare HMO | Attending: Internal Medicine

## 2019-05-31 ENCOUNTER — Other Ambulatory Visit: Payer: Self-pay

## 2019-05-31 DIAGNOSIS — Z23 Encounter for immunization: Secondary | ICD-10-CM

## 2019-05-31 NOTE — Progress Notes (Signed)
   Covid-19 Vaccination Clinic  Name:  Stacy [name]    MRN: [id number] DOB: [birth year]  05/31/2019  [name] was observed post Covid-19 immunization for 15 minutes without incident. She was provided with Vaccine Information Sheet and instruction to access the V-Safe system.   [name] was instructed to call 911 with any severe reactions post vaccine: Marland Kitchen Difficulty breathing  . Swelling of face and throat  . A fast heartbeat  . A bad rash all over body  . Dizziness and weakness   Immunizations Administered    Name Date Dose VIS Date Route   Pfizer COVID-19 Vaccine 05/31/2019  8:56 AM 0.3 mL 02/05/2019 Intramuscular   Manufacturer: [place]   Lot: [id number]   [place]: [id number]

## 2019-06-10 ENCOUNTER — Encounter: Payer: Self-pay | Admitting: Psychiatry

## 2019-06-10 ENCOUNTER — Ambulatory Visit (INDEPENDENT_AMBULATORY_CARE_PROVIDER_SITE_OTHER): Payer: Medicare HMO | Admitting: Psychiatry

## 2019-06-10 ENCOUNTER — Other Ambulatory Visit: Payer: Self-pay

## 2019-06-10 DIAGNOSIS — F3181 Bipolar II disorder: Secondary | ICD-10-CM

## 2019-06-10 DIAGNOSIS — F411 Generalized anxiety disorder: Secondary | ICD-10-CM

## 2019-06-10 DIAGNOSIS — G2111 Neuroleptic induced parkinsonism: Secondary | ICD-10-CM | POA: Diagnosis not present

## 2019-06-10 DIAGNOSIS — F5105 Insomnia due to other mental disorder: Secondary | ICD-10-CM | POA: Diagnosis not present

## 2019-06-10 MED ORDER — BUPROPION HCL ER (XL) 150 MG PO TB24: 150.0000 mg | ORAL_TABLET | Freq: Every day | ORAL | 0 refills | Status: DC

## 2019-06-10 MED ORDER — HYDROXYZINE PAMOATE 25 MG PO CAPS: 25.0000 mg | ORAL_CAPSULE | Freq: Every day | ORAL | 0 refills | Status: DC | PRN

## 2019-06-10 MED ORDER — QUETIAPINE FUMARATE 25 MG PO TABS: 12.5000 mg | ORAL_TABLET | Freq: Every day | ORAL | 0 refills | Status: DC

## 2019-06-10 NOTE — Progress Notes (Signed)
Provider Location : [facility] Patient Location : Home  Virtual Visit via Video Note  I connected with Stacy [name] on 06/10/19 at 11:40 AM EDT by a video enabled telemedicine application and verified that I am speaking with the correct person using two identifiers.   I discussed the limitations of evaluation and management by telemedicine and the availability of in person appointments. The patient expressed understanding and agreed to proceed.   I discussed the assessment and treatment plan with the patient. The patient was provided an opportunity to ask questions and all were answered. The patient agreed with the plan and demonstrated an understanding of the instructions.   The patient was advised to call back or seek an in-person evaluation if the symptoms worsen or if the condition fails to improve as anticipated.   Bradford Woods MD OP Progress Note  06/10/2019 6:02 PM Stacy [name]  MRN:  [id number]  Chief Complaint:  Chief Complaint    Follow-up     HPI: Stacy [name] is a 59 year old Caucasian female, unemployed, married, lives in [place], has a history of bipolar disorder, GAD, insomnia, mild cognitive disorder, neuroleptic induced Parkinson's, Sjogren's syndrome, hypertension, hypothyroidism, arthritis, migraine headaches was evaluated by telemedicine today.  Patient was last seen on 09/25/2018.  Patient today reports she had moved down to [place] for a few months however she did not like it and hence decided to move back to [place].  She reports a lot of her medications were readjusted while she was in [place].  She reports she did not do well on those medication changes.  She currently feels extremely anxious and has mood lability.  She also reports she has been having sleep problems.  She reports her sleep gets interrupted and when she wakes up she feels as though there is someone in her room and feels unsafe.  Patient reports she has been noncompliant with psychotherapy  sessions.  She does have psychosocial stressors of the relocation as well as her grandchild who is currently struggling with cancer.  Patient reports she would like to get back on her medications and also needs help with her sleep.  Patient denies any suicidality or homicidality.  She denies any other concerns today. Visit Diagnosis:    ICD-10-CM   1. Bipolar 2 disorder (HCC)  F31.81 buPROPion (WELLBUTRIN XL) 150 MG 24 hr tablet    QUEtiapine (SEROQUEL) 25 MG tablet   mixed, mild  2. GAD (generalized anxiety disorder)  F41.1 hydrOXYzine (VISTARIL) 25 MG capsule   mixed, mild  3. Insomnia due to mental disorder  F51.05 QUEtiapine (SEROQUEL) 25 MG tablet  4. 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  Past trials of Paxil, Wellbutrin, Latuda  Past Medical History:  Past Medical History:  Diagnosis Date  . Aneurysm ([place])   . Anxiety   . Arthritis   . Brain stem lesion   . Chronic kidney disease    kideny stone  . Depression   . Fibromyalgia   . GERD (gastroesophageal reflux disease)   . Headache   . Hypotension   . Hypothyroid   . Memory loss   . Migraine   . Raynaud disease   . Sjogren's syndrome ([place])   . Thyroid disease     Past Surgical History:  Procedure Laterality Date  . ABDOMINAL HYSTERECTOMY    . BASAL CELL CARCINOMA EXCISION    . BREAST BIOPSY    .  CESAREAN SECTION    . EYE SURGERY    . KNEE SURGERY Left   . LITHOTRIPSY    . MUSCLE BIOPSY    . OOPHORECTOMY      Family Psychiatric History: I have reviewed family psychiatric history from my progress note on 12/10/2017  Family History:  Family History  Problem Relation Age of Onset  . Migraines Mother   . Anxiety disorder Mother   . Depression Mother   . Cancer Father   . Hypertension Father   . Stroke Father   . Heart attack Father   . Alcohol abuse Father   . Diabetes Brother   .  Alcohol abuse Sister   . Drug abuse Sister   . Anxiety disorder Sister   . Depression Sister   . Drug abuse Brother   . Cancer Paternal Aunt   . Lupus Paternal Aunt   . Thyroid disease Paternal Aunt   . Diabetes Maternal Grandfather   . Anxiety disorder Maternal Grandmother   . Depression Maternal Grandmother     Social History: I have reviewed social history from my progress note on 12/10/2017 Social History   Socioeconomic History  . Marital status: Married    Spouse name: Stacy [name]  . Number of children: 2  . Years of education: 82  . Highest education level: Associate degree: occupational, Hotel manager, or vocational program  Occupational History  . Occupation: Disabled  Tobacco Use  . Smoking status: Never Smoker  . Smokeless tobacco: Never Used  Substance and Sexual Activity  . Alcohol use: No    Alcohol/week: 0.0 standard drinks  . Drug use: Never  . Sexual activity: Yes    Birth control/protection: None  Other Topics Concern  . Not on file  Social History Narrative   Lives at home with husband. Stacy [name]).    Disabled.   Education college.   Right handed.   2 cups caffeine/daily   Social Determinants of Health   Financial Resource Strain:   . Difficulty of Paying Living Expenses:   Food Insecurity:   . Worried About Charity fundraiser in the Last Year:   . Arboriculturist in the Last Year:   Transportation Needs:   . Film/video editor (Medical):   Marland Kitchen Lack of Transportation (Non-Medical):   Physical Activity:   . Days of Exercise per Week:   . Minutes of Exercise per Session:   Stress:   . Feeling of Stress :   Social Connections:   . Frequency of Communication with Friends and Family:   . Frequency of Social Gatherings with Friends and Family:   . Attends Religious Services:   . Active Member of Clubs or Organizations:   . Attends Archivist Meetings:   Marland Kitchen Marital Status:     Allergies:  Allergies  Allergen Reactions  . Penicillins  Hives  . Amoxicillin Hives    Metabolic Disorder Labs: Lab Results  Component Value Date   HGBA1C 5.0 11/13/2015   No results found for: PROLACTIN No results found for: CHOL, TRIG, HDL, CHOLHDL, VLDL, LDLCALC Lab Results  Component Value Date   TSH 1.780 10/26/2015   TSH 0.525 03/15/2015    Therapeutic Level Labs: No results found for: LITHIUM No results found for: VALPROATE No components found for:  CBMZ  Current Medications: Current Outpatient Medications  Medication Sig Dispense Refill  . alendronate (FOSAMAX) 70 MG tablet     . benztropine (COGENTIN) 1 MG tablet Take 1 tablet (1 mg  total) by mouth 2 (two) times daily. 180 tablet 0  . busPIRone (BUSPAR) 10 MG tablet Take 1 tablet (10 mg total) by mouth 2 (two) times daily. 60 tablet 1  . Calcium Carbonate-Vitamin D 600-200 MG-UNIT TABS Take by mouth.    . Calcium-Vitamin D 600-200 MG-UNIT per tablet Take by mouth.    . Cholecalciferol (VITAMIN D-3) 1000 units CAPS Take by mouth daily.    . Cranberry-Vitamin C-Vitamin E (CRANBERRY/VITAMIN C TRIPLE ST PO) Take by mouth.    . DEXILANT 60 MG capsule     . donepezil (ARICEPT) 10 MG tablet Take 1 tablet (10 mg total) by mouth at bedtime. 90 tablet 4  . famotidine (PEPCID) 40 MG tablet     . fludrocortisone (FLORINEF) 0.1 MG tablet Take 2 tablets by mouth two times daily 360 tablet 2  . gabapentin (NEURONTIN) 300 MG capsule Take 1 capsule by mouth two times daily 180 capsule 2  . hydroxychloroquine (PLAQUENIL) 200 MG tablet     . levothyroxine (SYNTHROID, LEVOTHROID) 175 MCG tablet Take 1 tablet (175 mcg total) by mouth daily before breakfast. 90 tablet 3  . loperamide (IMODIUM A-D) 2 MG tablet Take 1 tablet (2 mg total) by mouth 4 (four) times daily as needed for diarrhea or loose stools. 30 tablet 0  . meloxicam (MOBIC) 15 MG tablet     . omeprazole (PRILOSEC) 20 MG capsule Take 1 capsule (20 mg total) by mouth daily. 180 capsule 3  . ondansetron (ZOFRAN-ODT) 4 MG  disintegrating tablet Take 1 tablet (4 mg total) by mouth every 8 (eight) hours as needed for nausea or vomiting. 20 tablet 0  . SUMAtriptan (IMITREX) 100 MG tablet     . vitamin E 400 UNIT capsule Take 400 Units by mouth daily.    Marland Kitchen buPROPion (WELLBUTRIN XL) 150 MG 24 hr tablet Take 1 tablet (150 mg total) by mouth daily. 30 tablet 0  . donepezil (ARICEPT) 10 MG tablet Take by mouth.    . hydrOXYzine (VISTARIL) 25 MG capsule Take 1 capsule (25 mg total) by mouth daily as needed (for severe anxiety). Anxiety/agitation- please limit use 90 capsule 0  . QUEtiapine (SEROQUEL) 25 MG tablet Take 1.5 tablets (37.5 mg total) by mouth at bedtime. Start taking 1 tablet for 2 weeks and increase to 1.5 tablets (Patient not taking: Reported on 06/10/2019) 45 tablet 1  . QUEtiapine (SEROQUEL) 25 MG tablet Take 0.5-1 tablets (12.5-25 mg total) by mouth at bedtime. Mood and sleep 30 tablet 0   No current facility-administered medications for this visit.     Musculoskeletal: Strength & Muscle Tone: UTA Gait & Station: normal Patient leans: N/A  Psychiatric Specialty Exam: Review of Systems  Psychiatric/Behavioral: Positive for dysphoric mood and sleep disturbance. The patient is nervous/anxious.   All other systems reviewed and are negative.   There were no vitals taken for this visit.There is no height or weight on file to calculate BMI.  General Appearance: Casual  Eye Contact:  Fair  Speech:  Clear and Coherent  Volume:  Normal  Mood:  Anxious and Depressed  Affect:  Congruent  Thought Process:  Goal Directed and Descriptions of Associations: Intact  Orientation:  Full (Time, Place, and Person)  Thought Content: Paranoid Ideation   Suicidal Thoughts:  No  Homicidal Thoughts:  No  Memory:  Immediate;   Fair Recent;   Fair Remote;   Fair  Judgement:  Fair  Insight:  Fair  Psychomotor Activity:  Normal  Concentration:  Concentration: Fair and Attention Span: Fair  Recall:  [employer] of  Knowledge: Fair  Language: Fair  Akathisia:  No  Handed:  Right  AIMS (if indicated): UTA  Assets:  Communication Skills Desire for Improvement Housing Intimacy Social Support Talents/Skills Transportation  ADL's:  Intact  Cognition: WNL  Sleep:  Poor   Screenings: AIMS     Office Visit from 06/15/2018 in [facility] Office Visit from 05/20/2018 in [facility] Total Score  5  9    Mini-Mental     Office Visit from 01/01/2016 in [facility] Office Visit from 11/13/2015 in [facility] Office Visit from 10/26/2015 in [facility] Office Visit from 04/25/2015 in [facility] Office Visit from 10/25/2014 in [facility]  Total Score (max 30 points )  25  26  21  23  29     PHQ2-9     Office Visit from 02/08/2015 in [facility] Office Visit from 10/07/2014 in [facility]  PHQ-2 Total Score  0  4  PHQ-9 Total Score  --  8       Assessment and Plan: Leilany is a 59 year old Caucasian female who has a history of bipolar disorder type II, neuroleptic induced parkinsonism, insomnia, cognitive disorder, Sjogren's syndrome, hypothyroidism, fibromyalgia was evaluated by telemedicine today.  Patient with psychosocial stressors of her grandsons cancer diagnosis, relocation and health issues continues to struggle with mood lability, possible paranoia and sleep issues.  She will benefit from the following medication changes.  Plan as noted below.  Plan Bipolar disorder type II-unstable Restart Seroquel 25 mg p.o. nightly She is no longer on the Paxil and it was discontinued while she was in [place].  Will not restart it today. Patient is currently on Wellbutrin 75 mg p.o. 3 times daily.  Discussed with patient that since she is struggling with sleep we will change the Wellbutrin to 150 mg p.o. daily in the  morning.   GAD-unstable Seroquel as prescribed. BuSpar 10 mg p.o. twice daily. Restart hydroxyzine 25 mg p.o. daily as needed for severe anxiety attacks. Advised patient to restart psychotherapy sessions  Insomnia-unstable Seroquel will help.  Neuroleptic induced movement disorder-improving We will continue Cogentin as prescribed  Follow-up in clinic in 2 to 3 weeks or sooner if needed.  I have spent atleast 20 minutes non face to face with patient today. More than 50 % of the time was spent for preparing to see the patient ( e.g., review of test, records ),ordering medications and test ,psychoeducation and supportive psychotherapy and care coordination,as well as documenting clinical information in electronic health record. This note was generated in part or whole with voice recognition software. Voice recognition is usually quite accurate but there are transcription errors that can and very often do occur. I apologize for any typographical errors that were not detected and corrected.       Ursula Alert, MD 06/10/2019, 6:02 PM

## 2019-06-22 ENCOUNTER — Ambulatory Visit: Payer: Medicare HMO | Attending: Internal Medicine

## 2019-06-22 DIAGNOSIS — Z23 Encounter for immunization: Secondary | ICD-10-CM

## 2019-06-22 NOTE — Progress Notes (Signed)
   Covid-19 Vaccination Clinic  Name:  Stacy [name]    MRN: [id number] DOB: [birth year]  06/22/2019  Stacy [name] was observed post Covid-19 immunization for 15 minutes without incident. She was provided with Vaccine Information Sheet and instruction to access the V-Safe system.   Stacy [name] was instructed to call 911 with any severe reactions post vaccine: Marland Kitchen Difficulty breathing  . Swelling of face and throat  . A fast heartbeat  . A bad rash all over body  . Dizziness and weakness   Immunizations Administered    Name Date Dose VIS Date Route   Pfizer COVID-19 Vaccine 06/22/2019 10:52 AM 0.3 mL 04/21/2018 Intramuscular   Manufacturer: [place]   Lot: [id number]   [place]: [id number]

## 2019-07-09 ENCOUNTER — Telehealth (INDEPENDENT_AMBULATORY_CARE_PROVIDER_SITE_OTHER): Payer: Medicare HMO | Admitting: Psychiatry

## 2019-07-09 ENCOUNTER — Encounter: Payer: Self-pay | Admitting: Psychiatry

## 2019-07-09 ENCOUNTER — Telehealth: Payer: Self-pay

## 2019-07-09 ENCOUNTER — Telehealth: Payer: Self-pay | Admitting: Psychiatry

## 2019-07-09 ENCOUNTER — Other Ambulatory Visit: Payer: Self-pay

## 2019-07-09 DIAGNOSIS — F411 Generalized anxiety disorder: Secondary | ICD-10-CM

## 2019-07-09 DIAGNOSIS — F3181 Bipolar II disorder: Secondary | ICD-10-CM | POA: Diagnosis not present

## 2019-07-09 DIAGNOSIS — G2111 Neuroleptic induced parkinsonism: Secondary | ICD-10-CM | POA: Diagnosis not present

## 2019-07-09 DIAGNOSIS — F5105 Insomnia due to other mental disorder: Secondary | ICD-10-CM

## 2019-07-09 DIAGNOSIS — Z79899 Other long term (current) drug therapy: Secondary | ICD-10-CM

## 2019-07-09 DIAGNOSIS — F09 Unspecified mental disorder due to known physiological condition: Secondary | ICD-10-CM | POA: Insufficient documentation

## 2019-07-09 MED ORDER — QUETIAPINE FUMARATE 25 MG PO TABS: 25.0000 mg | ORAL_TABLET | Freq: Every day | ORAL | 0 refills | Status: DC

## 2019-07-09 MED ORDER — DIVALPROEX SODIUM 250 MG PO DR TAB: 250.0000 mg | DELAYED_RELEASE_TABLET | Freq: Two times a day (BID) | ORAL | 1 refills | Status: DC

## 2019-07-09 MED ORDER — BUSPIRONE HCL 10 MG PO TABS: 10.0000 mg | ORAL_TABLET | Freq: Two times a day (BID) | ORAL | 1 refills | Status: DC

## 2019-07-09 MED ORDER — BUPROPION HCL 75 MG PO TABS: 75.0000 mg | ORAL_TABLET | Freq: Every morning | ORAL | 0 refills | Status: DC

## 2019-07-09 NOTE — Progress Notes (Signed)
Provider Location : [facility] Patient Location : Home  Virtual Visit via Video Note  I connected with Stacy [name] on 07/09/19 at 10:20 AM EDT by a video enabled telemedicine application and verified that I am speaking with the correct person using two identifiers.   I discussed the limitations of evaluation and management by telemedicine and the availability of in person appointments. The patient expressed understanding and agreed to proceed.     I discussed the assessment and treatment plan with the patient. The patient was provided an opportunity to ask questions and all were answered. The patient agreed with the plan and demonstrated an understanding of the instructions.   The patient was advised to call back or seek an in-person evaluation if the symptoms worsen or if the condition fails to improve as anticipated.  [place] MD OP Progress Note  07/09/2019 11:00 AM Tanitra Koehler  MRN:  [id number]  Chief Complaint:  Chief Complaint    Follow-up     HPI: Stacy [name] is a 59 year old Caucasian female, unemployed, married, lives in [place], has a history of bipolar disorder, insomnia, neuroleptic induced parkinson's , Sjogren's syndrome, cognitive disorder, hypertension, hypothyroidism, arthritis, migraine headache was evaluated by telemedicine today.  Due to connection problem even though a video call was initiated, camera was blocked and the appointment was done solely through audio.  Patient today reports she continues to struggle with anxiety symptoms, restlessness, being on edge.  She has not noticed much changes since her last appointment with writer in April.  She reports her husband has also observed this and brought it to her attention.  Patient reports she is often noted as pacing.  Patient reports that her visual hallucinations of seeing shadows and feeling a presence in her room when she wakes up in the middle of the night has improved since being on the  Seroquel.  Patient reports her sleep is good on the Seroquel.  Patient continues to have movements around her mouth however that is better and it has not worsened since her last appointment with Probation officer.  Patient denies any suicidality, homicidality or perceptual disturbances.  Patient continues to not have a therapist and is currently trying to establish care with a new therapist.  She has reached out to her health insurance plan.  Patient advised to also reach out to the clinic to see if she can be placed on our new therapy schedule.  Patient denies any other concerns today.    Visit Diagnosis:    ICD-10-CM   1. Bipolar 2 disorder (HCC)  F31.81 buPROPion (WELLBUTRIN) 75 MG tablet    divalproex (DEPAKOTE) 250 MG DR tablet   Mixed, mild  2. GAD (generalized anxiety disorder)  F41.1 divalproex (DEPAKOTE) 250 MG DR tablet  3. Insomnia due to mental disorder  F51.05 busPIRone (BUSPAR) 10 MG tablet    QUEtiapine (SEROQUEL) 25 MG tablet  4. 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 Insomnia due to mental disorder  F51.05 busPIRone (BUSPAR) 10 MG tablet    QUEtiapine (SEROQUEL) 25 MG tablet   mixed,mild  6. High risk medication use  Z79.899 Valproic acid level    CBC With Differential    Comprehensive metabolic panel    CANCELED: TSH  7. Mild cognitive disorder  F09 buPROPion (WELLBUTRIN) 75 MG tablet    Past Psychiatric History: I have reviewed past psychiatric history from my progress note on 12/10/2017.  Past trials of Paxil, Wellbutrin, Latuda  Past Medical History:  Past Medical History:  Diagnosis Date  . Aneurysm ([place])   . Anxiety   . Arthritis   . Brain stem lesion   . Chronic kidney disease    kideny stone  . Depression   . Fibromyalgia   . GERD (gastroesophageal reflux disease)   . Headache   . Hypotension   . Hypothyroid   . Memory loss   . Migraine   . Raynaud disease   . Sjogren's syndrome ([place])   . Thyroid disease     Past Surgical History:   Procedure Laterality Date  . ABDOMINAL HYSTERECTOMY    . BASAL CELL CARCINOMA EXCISION    . BREAST BIOPSY    . CESAREAN SECTION    . EYE SURGERY    . KNEE SURGERY Left   . LITHOTRIPSY    . MUSCLE BIOPSY    . OOPHORECTOMY      Family Psychiatric History: I have reviewed family psychiatric history from my progress note on 12/10/2017.  Family History:  Family History  Problem Relation Age of Onset  . Migraines Mother   . Anxiety disorder Mother   . Depression Mother   . Cancer Father   . Hypertension Father   . Stroke Father   . Heart attack Father   . Alcohol abuse Father   . Diabetes Brother   . Alcohol abuse Sister   . Drug abuse Sister   . Anxiety disorder Sister   . Depression Sister   . Drug abuse Brother   . Cancer Paternal Aunt   . Lupus Paternal Aunt   . Thyroid disease Paternal Aunt   . Diabetes Maternal Grandfather   . Anxiety disorder Maternal Grandmother   . Depression Maternal Grandmother     Social History: I have reviewed social history from my progress note on 12/02/2017. Social History   Socioeconomic History  . Marital status: Married    Spouse name: [name]  . Number of children: 2  . Years of education: 55  . Highest education level: Associate degree: occupational, Hotel manager, or vocational program  Occupational History  . Occupation: Disabled  Tobacco Use  . Smoking status: Never Smoker  . Smokeless tobacco: Never Used  Substance and Sexual Activity  . Alcohol use: No    Alcohol/week: 0.0 standard drinks  . Drug use: Never  . Sexual activity: Yes    Birth control/protection: None  Other Topics Concern  . Not on file  Social History Narrative   Lives at home with husband. [name]).    Disabled.   Education college.   Right handed.   2 cups caffeine/daily   Social Determinants of Health   Financial Resource Strain:   . Difficulty of Paying Living Expenses:   Food Insecurity:   . Worried About Charity fundraiser in the Last  Year:   . Arboriculturist in the Last Year:   Transportation Needs:   . Film/video editor (Medical):   Marland Kitchen Lack of Transportation (Non-Medical):   Physical Activity:   . Days of Exercise per Week:   . Minutes of Exercise per Session:   Stress:   . Feeling of Stress :   Social Connections:   . Frequency of Communication with Friends and Family:   . Frequency of Social Gatherings with Friends and Family:   . Attends Religious Services:   . Active Member of Clubs or Organizations:   . Attends Archivist Meetings:   Marland Kitchen Marital Status:     Allergies:  Allergies  Allergen Reactions  . Penicillins Hives  . Amoxicillin Hives    Metabolic Disorder Labs: Lab Results  Component Value Date   HGBA1C 5.0 11/13/2015   No results found for: PROLACTIN No results found for: CHOL, TRIG, HDL, CHOLHDL, VLDL, LDLCALC Lab Results  Component Value Date   TSH 1.780 10/26/2015   TSH 0.525 03/15/2015    Therapeutic Level Labs: No results found for: LITHIUM No results found for: VALPROATE No components found for:  CBMZ  Current Medications: Current Outpatient Medications  Medication Sig Dispense Refill  . alendronate (FOSAMAX) 70 MG tablet     . benztropine (COGENTIN) 1 MG tablet Take 1 tablet (1 mg total) by mouth 2 (two) times daily. 180 tablet 0  . busPIRone (BUSPAR) 10 MG tablet Take 1 tablet (10 mg total) by mouth 2 (two) times daily. 60 tablet 1  . Calcium Carbonate-Vitamin D 600-200 MG-UNIT TABS Take by mouth.    . Calcium-Vitamin D 600-200 MG-UNIT per tablet Take by mouth.    . Cholecalciferol (VITAMIN D-3) 1000 units CAPS Take by mouth daily.    . Cranberry-Vitamin C-Vitamin E (CRANBERRY/VITAMIN C TRIPLE ST PO) Take by mouth.    . DEXILANT 60 MG capsule     . donepezil (ARICEPT) 10 MG tablet Take 1 tablet (10 mg total) by mouth at bedtime. 90 tablet 4  . famotidine (PEPCID) 40 MG tablet     . fludrocortisone (FLORINEF) 0.1 MG tablet Take 2 tablets by mouth two times  daily 360 tablet 2  . gabapentin (NEURONTIN) 300 MG capsule Take 1 capsule by mouth two times daily 180 capsule 2  . hydroxychloroquine (PLAQUENIL) 200 MG tablet     . hydrOXYzine (VISTARIL) 25 MG capsule Take 1 capsule (25 mg total) by mouth daily as needed (for severe anxiety). Anxiety/agitation- please limit use 90 capsule 0  . levothyroxine (SYNTHROID, LEVOTHROID) 175 MCG tablet Take 1 tablet (175 mcg total) by mouth daily before breakfast. 90 tablet 3  . loperamide (IMODIUM A-D) 2 MG tablet Take 1 tablet (2 mg total) by mouth 4 (four) times daily as needed for diarrhea or loose stools. 30 tablet 0  . meloxicam (MOBIC) 15 MG tablet     . omeprazole (PRILOSEC) 20 MG capsule Take 1 capsule (20 mg total) by mouth daily. 180 capsule 3  . ondansetron (ZOFRAN-ODT) 4 MG disintegrating tablet Take 1 tablet (4 mg total) by mouth every 8 (eight) hours as needed for nausea or vomiting. 20 tablet 0  . QUEtiapine (SEROQUEL) 25 MG tablet Take 0.5-1 tablets (12.5-25 mg total) by mouth at bedtime. Mood and sleep 30 tablet 0  . QUEtiapine (SEROQUEL) 25 MG tablet Take 1 tablet (25 mg total) by mouth at bedtime. 90 tablet 0  . SUMAtriptan (IMITREX) 100 MG tablet     . vitamin E 400 UNIT capsule Take 400 Units by mouth daily.    Marland Kitchen buPROPion (WELLBUTRIN) 75 MG tablet Take 1 tablet (75 mg total) by mouth every morning. 90 tablet 0  . divalproex (DEPAKOTE) 250 MG DR tablet Take 1 tablet (250 mg total) by mouth 2 (two) times daily. 30 tablet 1  . donepezil (ARICEPT) 10 MG tablet Take by mouth.     No current facility-administered medications for this visit.     Musculoskeletal: Strength & Muscle Tone: UTA Gait & Station: UTA Patient leans: N/A  Psychiatric Specialty Exam: Review of Systems  Psychiatric/Behavioral: Positive for dysphoric mood and hallucinations. The patient is nervous/anxious.   All other systems reviewed  and are negative.   There were no vitals taken for this visit.There is no height or  weight on file to calculate BMI.  General Appearance: UTA  Eye Contact:  UTA  Speech:  Clear and Coherent  Volume:  Normal  Mood:  Anxious and Depressed  Affect:  UTA  Thought Process:  Goal Directed and Descriptions of Associations: Intact  Orientation:  Full (Time, Place, and Person)  Thought Content: Hallucinations: Visual seeing shadows - have improved  Suicidal Thoughts:  No  Homicidal Thoughts:  No  Memory:  Immediate;   Fair Recent;   Fair Remote;   Fair  Judgement:  Fair  Insight:  Fair  Psychomotor Activity:  UTA  Concentration:  Concentration: Fair and Attention Span: Fair  Recall:  [employer] of Knowledge: Fair  Language: Fair  Akathisia:  No  Handed:  Right  AIMS (if indicated): UTA  Assets:  Communication Skills Desire for Improvement Housing Social Support  ADL's:  Intact  Cognition: WNL  Sleep:  Fair   Screenings: AIMS     Office Visit from 06/15/2018 in [facility] Office Visit from 05/20/2018 in [facility] Total Score  5  9    Mini-Mental     Office Visit from 01/01/2016 in [facility] Office Visit from 11/13/2015 in [facility] Office Visit from 10/26/2015 in [facility] Office Visit from 04/25/2015 in [facility] Office Visit from 10/25/2014 in [facility]  Total Score (max 30 points )  25  26  21  23  29     PHQ2-9     Office Visit from 02/08/2015 in [facility] Office Visit from 10/07/2014 in [facility]  PHQ-2 Total Score  0  4  PHQ-9 Total Score  --  8       Assessment and Plan: Meyah Salasar is a 59 year old Caucasian female who has a history of bipolar disorder type II, neuroleptic induced parkinsonian, cognitive disorder, insomnia, Sjogren's syndrome, hypothyroidism, fibromyalgia was evaluated by telemedicine today.  Patient with psychosocial stressors  of her grandson's cancer diagnosis, relocation, health problems continues to struggle with mood lability, anxiety.  Patient will benefit from medication readjustment and psychotherapy sessions.  Plan as noted below.  Plan Bipolar disorder type II-improving Continue Seroquel at low dosage of 25 mg p.o. nightly Patient currently denies any worsening movement problems, does have movements around her mouth on and off which has been chronic. Reduce Wellbutrin to 75 mg p.o. daily in the morning.  This is due to her increased anxiety. Start Depakote ER 250 mg p.o. twice daily  GAD-unstable Seroquel as prescribed BuSpar 10 mg p.o. twice daily Patient to start psychotherapy sessions.  She will reach out to our clinic to see if she can be placed on a new therapist schedule.  Insomnia-stable Seroquel as prescribed.  Neuroleptic induced movement disorder-improving Continue Cogentin as prescribed  High risk medication use-will order Depakote level, CBC with differential, CMP.  Patient will get it 5 days after starting the new medication.  Follow-up in clinic in 3 weeks or sooner if needed.  I have spent atleast 20 minutes non face to face with patient today. More than 50 % of the time was spent for preparing to see the patient ( e.g., review of test, records ), obtaining and to review and separately obtained history , ordering medications and test ,psychoeducation and supportive psychotherapy and care coordination,as well as documenting clinical  information in electronic health record,interpreting results of test and communication of results This note was generated in part or whole with voice recognition software. Voice recognition is usually quite accurate but there are transcription errors that can and very often do occur. I apologize for any typographical errors that were not detected and corrected.       Ursula Alert, MD 07/09/2019, 11:00 AM

## 2019-07-09 NOTE — Telephone Encounter (Signed)
lab work orders mailed out  

## 2019-07-09 NOTE — Telephone Encounter (Signed)
Will order CBC with differential, Depakote, CMP.  We will have Janett Billow CMA sent the labs to her mailing address.  Patient will go to the nearest [place] location to get it done 5 days after starting Depakote.

## 2019-07-23 ENCOUNTER — Ambulatory Visit (INDEPENDENT_AMBULATORY_CARE_PROVIDER_SITE_OTHER): Payer: Medicare HMO | Admitting: Licensed Clinical Social Worker

## 2019-07-23 ENCOUNTER — Other Ambulatory Visit: Payer: Self-pay

## 2019-07-23 DIAGNOSIS — F3181 Bipolar II disorder: Secondary | ICD-10-CM

## 2019-07-23 NOTE — Progress Notes (Signed)
Virtual Visit via Video Note  I connected with Stacy [name] on 07/23/19 at  9:00 AM EDT by a video enabled telemedicine application and verified that I am speaking with the correct person using two identifiers.  Location: Patient: home Provider: [facility]   I discussed the limitations of evaluation and management by telemedicine and the availability of in person appointments. The patient expressed understanding and agreed to proceed.    I discussed the assessment and treatment plan with the patient. The patient was provided an opportunity to ask questions and all were answered. The patient agreed with the plan and demonstrated an understanding of the instructions.   The patient was advised to call back or seek an in-person evaluation if the symptoms worsen or if the condition fails to improve as anticipated.   THERAPIST PROGRESS NOTE  Session Time: 45 min  Participation Level: Active  Behavioral Response: NA and NeatAlertAnxious and Depressed  Type of Therapy: Individual Therapy  Treatment Goals addressed: Anger and Coping  Interventions: CBT, supportive therapy, solution focused therapy   Summary: Stacy [name] is a 59 y.o. female who presents with stress associated with complex family relationships. LCSW allowed pt to explore and express thoughts and feelings about several family members. LCSW encouraged pt to focus on self-care and concept of overall acceptance.   Pt having some mood fluctuations--LCSW discussed pts perspective of what works best to level out moods. LCSW encouraged pt to continue focusing on stress management and spending time with pet that makes pt feel more secure/happy.     Suicidal/Homicidal: Negativewithout intent/plan  Therapist Response: Pt reports mood stable and ability to manage stress and anxiety.   Plan: Return again in 6 weeks.  Diagnosis: Axis I: Bipolar, Depressed    Axis II: No diagnosis    [place], LCSW 07/23/2019

## 2019-07-28 ENCOUNTER — Other Ambulatory Visit: Payer: Self-pay | Admitting: Psychiatry

## 2019-07-29 ENCOUNTER — Telehealth: Payer: Self-pay | Admitting: Psychiatry

## 2019-07-29 LAB — COMPREHENSIVE METABOLIC PANEL
ALT: 11 IU/L (ref 0–32)
AST: 16 IU/L (ref 0–40)
Albumin/Globulin Ratio: 2.3 — ABNORMAL HIGH (ref 1.2–2.2)
Albumin: 4.4 g/dL (ref 3.8–4.9)
Alkaline Phosphatase: 112 IU/L (ref 48–121)
BUN/Creatinine Ratio: 22 (ref 9–23)
BUN: 23 mg/dL (ref 6–24)
Bilirubin Total: 0.3 mg/dL (ref 0.0–1.2)
CO2: 24 mmol/L (ref 20–29)
Calcium: 8.9 mg/dL (ref 8.7–10.2)
Chloride: 104 mmol/L (ref 96–106)
Creatinine, Ser: 1.06 mg/dL — ABNORMAL HIGH (ref 0.57–1.00)
GFR calc Af Amer: 67 mL/min/{1.73_m2} (ref >59)
GFR calc non Af Amer: 58 mL/min/{1.73_m2} — ABNORMAL LOW (ref >59)
Globulin, Total: 1.9 g/dL (ref 1.5–4.5)
Glucose: 124 mg/dL — ABNORMAL HIGH (ref 65–99)
Potassium: 4.5 mmol/L (ref 3.5–5.2)
Sodium: 140 mmol/L (ref 134–144)
Total Protein: 6.3 g/dL (ref 6.0–8.5)

## 2019-07-29 LAB — CBC WITH DIFFERENTIAL
Basophils Absolute: 0 10*3/uL (ref 0.0–0.2)
Basos: 0 %
EOS (ABSOLUTE): 0.1 10*3/uL (ref 0.0–0.4)
Eos: 2 %
Hematocrit: 37.4 % (ref 34.0–46.6)
Hemoglobin: 12.9 g/dL (ref 11.1–15.9)
Immature Grans (Abs): 0 10*3/uL (ref 0.0–0.1)
Immature Granulocytes: 0 %
Lymphocytes Absolute: 1.8 10*3/uL (ref 0.7–3.1)
Lymphs: 34 %
MCH: 31.8 pg (ref 26.6–33.0)
MCHC: 34.5 g/dL (ref 31.5–35.7)
MCV: 92 fL (ref 79–97)
Monocytes Absolute: 0.4 10*3/uL (ref 0.1–0.9)
Monocytes: 8 %
Neutrophils Absolute: 2.9 10*3/uL (ref 1.4–7.0)
Neutrophils: 56 %
RBC: 4.06 x10E6/uL (ref 3.77–5.28)
RDW: 12.4 % (ref 11.7–15.4)
WBC: 5.2 10*3/uL (ref 3.4–10.8)

## 2019-07-29 LAB — VALPROIC ACID LEVEL: Valproic Acid Lvl: 10 ug/mL — ABNORMAL LOW (ref 50–100)

## 2019-07-29 NOTE — Telephone Encounter (Signed)
Returned call to patient after reviewing her most recent labs results-her creatinine is elevated at 1.06.  She will get in touch with her primary care provider.

## 2019-07-30 ENCOUNTER — Other Ambulatory Visit: Payer: Self-pay | Admitting: Psychiatry

## 2019-07-30 ENCOUNTER — Telehealth: Payer: Self-pay

## 2019-07-30 ENCOUNTER — Encounter: Payer: Self-pay | Admitting: Psychiatry

## 2019-07-30 ENCOUNTER — Other Ambulatory Visit: Payer: Self-pay

## 2019-07-30 ENCOUNTER — Telehealth (INDEPENDENT_AMBULATORY_CARE_PROVIDER_SITE_OTHER): Payer: Medicare HMO | Admitting: Psychiatry

## 2019-07-30 DIAGNOSIS — G2111 Neuroleptic induced parkinsonism: Secondary | ICD-10-CM

## 2019-07-30 DIAGNOSIS — F411 Generalized anxiety disorder: Secondary | ICD-10-CM | POA: Diagnosis not present

## 2019-07-30 DIAGNOSIS — F5105 Insomnia due to other mental disorder: Secondary | ICD-10-CM

## 2019-07-30 DIAGNOSIS — F3181 Bipolar II disorder: Secondary | ICD-10-CM

## 2019-07-30 DIAGNOSIS — F09 Unspecified mental disorder due to known physiological condition: Secondary | ICD-10-CM

## 2019-07-30 DIAGNOSIS — Z79899 Other long term (current) drug therapy: Secondary | ICD-10-CM

## 2019-07-30 MED ORDER — BELSOMRA 10 MG PO TABS: 10.0000 mg | ORAL_TABLET | Freq: Every day | ORAL | 1 refills | Status: DC

## 2019-07-30 MED ORDER — DIVALPROEX SODIUM 500 MG PO DR TAB: 500.0000 mg | DELAYED_RELEASE_TABLET | Freq: Two times a day (BID) | ORAL | 1 refills | Status: DC

## 2019-07-30 MED ORDER — RAMELTEON 8 MG PO TABS: 8.0000 mg | ORAL_TABLET | Freq: Every day | ORAL | 1 refills | Status: DC

## 2019-07-30 NOTE — Progress Notes (Signed)
Provider Location : [facility] Patient Location : Home  Virtual Visit via Video Note  I connected with Stacy [name] on 07/30/19 at  8:45 AM EDT by a video enabled telemedicine application and verified that I am speaking with the correct person using two identifiers.   I discussed the limitations of evaluation and management by telemedicine and the availability of in person appointments. The patient expressed understanding and agreed to proceed.    I discussed the assessment and treatment plan with the patient. The patient was provided an opportunity to ask questions and all were answered. The patient agreed with the plan and demonstrated an understanding of the instructions.   The patient was advised to call back or seek an in-person evaluation if the symptoms worsen or if the condition fails to improve as anticipated.  [facility] MD OP Progress Note  07/30/2019 12:31 PM Stacy [name]  MRN:  [id number]  Chief Complaint:  Chief Complaint    Follow-up     HPI: Stacy [name] is a 59 year old Caucasian female, unemployed, married, lives in [place], has a history of bipolar disorder, insomnia, neuroleptic induced Parkinson's, Sjogren's syndrome, cognitive disorder, hypertension, hypothyroidism, arthritis, migraine headache was evaluated by telemedicine today.  Patient today reports she has noticed some improvement in her mood swings on the Depakote.  She denies any side effects to the Depakote.  She reports sleep is restless on and off.  But overall the Seroquel does help.   Patient reports she continues to have episodes when she feels a presence in her room however she is able to cope with it better than before.  She also has thoughts in her head which are racing on and off.  Patient denies any suicidality or homicidality.  Patient reports she went to her primary medical doctor yesterday and was evaluated for the slightly high creatinine level.  She reports she was told it was within normal  limits and no further investigation is required.  Patient denies any other concerns today.  Visit Diagnosis:    ICD-10-CM   1. Bipolar 2 disorder (HCC)  F31.81 divalproex (DEPAKOTE) 500 MG DR tablet   depressed mixed features, mild  2. GAD (generalized anxiety disorder)  F41.1   3. Insomnia due to mental disorder  F51.05 Suvorexant (BELSOMRA) 10 MG TABS    DISCONTINUED: ramelteon (ROZEREM) 8 MG tablet  4. 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 High risk medication use  Z79.899 divalproex (DEPAKOTE) 500 MG DR tablet    Valproic acid level  6. Mild cognitive disorder  F09     Past Psychiatric History: I have reviewed past psychiatric history from my progress note on 12/10/2017.  Past trials of Paxil, Wellbutrin, Latuda  Past Medical History:  Past Medical History:  Diagnosis Date  . Aneurysm ([place])   . Anxiety   . Arthritis   . Brain stem lesion   . Chronic kidney disease    kideny stone  . Depression   . Fibromyalgia   . GERD (gastroesophageal reflux disease)   . Headache   . Hypotension   . Hypothyroid   . Memory loss   . Migraine   . Raynaud disease   . Sjogren's syndrome ([place])   . Thyroid disease     Past Surgical History:  Procedure Laterality Date  . ABDOMINAL HYSTERECTOMY    . BASAL CELL CARCINOMA EXCISION    . BREAST BIOPSY    . CESAREAN SECTION    . EYE SURGERY    . KNEE  SURGERY Left   . LITHOTRIPSY    . MUSCLE BIOPSY    . OOPHORECTOMY      Family Psychiatric History: I have reviewed family psychiatric history from my progress note on 12/10/2017  Family History:  Family History  Problem Relation Age of Onset  . Migraines Mother   . Anxiety disorder Mother   . Depression Mother   . Cancer Father   . Hypertension Father   . Stroke Father   . Heart attack Father   . Alcohol abuse Father   . Diabetes Brother   . Alcohol abuse Sister   . Drug abuse Sister   . Anxiety disorder Sister   . Depression Sister   . Drug abuse Brother   .  Cancer Paternal Aunt   . Lupus Paternal Aunt   . Thyroid disease Paternal Aunt   . Diabetes Maternal Grandfather   . Anxiety disorder Maternal Grandmother   . Depression Maternal Grandmother     Social History: Reviewed social history from my progress note on 12/10/2017 Social History   Socioeconomic History  . Marital status: Married    Spouse name: [name]  . Number of children: 2  . Years of education: 27  . Highest education level: Associate degree: occupational, Hotel manager, or vocational program  Occupational History  . Occupation: Disabled  Tobacco Use  . Smoking status: Never Smoker  . Smokeless tobacco: Never Used  Substance and Sexual Activity  . Alcohol use: No    Alcohol/week: 0.0 standard drinks  . Drug use: Never  . Sexual activity: Yes    Birth control/protection: None  Other Topics Concern  . Not on file  Social History Narrative   Lives at home with husband. [name]).    Disabled.   Education college.   Right handed.   2 cups caffeine/daily   Social Determinants of Health   Financial Resource Strain:   . Difficulty of Paying Living Expenses:   Food Insecurity:   . Worried About Charity fundraiser in the Last Year:   . Arboriculturist in the Last Year:   Transportation Needs:   . Film/video editor (Medical):   Marland Kitchen Lack of Transportation (Non-Medical):   Physical Activity:   . Days of Exercise per Week:   . Minutes of Exercise per Session:   Stress:   . Feeling of Stress :   Social Connections:   . Frequency of Communication with Friends and Family:   . Frequency of Social Gatherings with Friends and Family:   . Attends Religious Services:   . Active Member of Clubs or Organizations:   . Attends Archivist Meetings:   Marland Kitchen Marital Status:     Allergies:  Allergies  Allergen Reactions  . Penicillins Hives  . Amoxicillin Hives    Metabolic Disorder Labs: Lab Results  Component Value Date   HGBA1C 5.0 11/13/2015   No results  found for: PROLACTIN No results found for: CHOL, TRIG, HDL, CHOLHDL, VLDL, LDLCALC Lab Results  Component Value Date   TSH 1.780 10/26/2015   TSH 0.525 03/15/2015    Therapeutic Level Labs: No results found for: LITHIUM Lab Results  Component Value Date   VALPROATE 10 (L) 07/28/2019   No components found for:  CBMZ  Current Medications: Current Outpatient Medications  Medication Sig Dispense Refill  . alendronate (FOSAMAX) 70 MG tablet     . benztropine (COGENTIN) 1 MG tablet Take 1 tablet (1 mg total) by mouth 2 (two) times  daily. 180 tablet 0  . buPROPion (WELLBUTRIN) 75 MG tablet Take 1 tablet (75 mg total) by mouth every morning. 90 tablet 0  . busPIRone (BUSPAR) 10 MG tablet Take 1 tablet (10 mg total) by mouth 2 (two) times daily. 60 tablet 1  . Calcium Carbonate-Vitamin D 600-200 MG-UNIT TABS Take by mouth.    . Calcium-Vitamin D 600-200 MG-UNIT per tablet Take by mouth.    . Cholecalciferol (VITAMIN D-3) 1000 units CAPS Take by mouth daily.    . Cranberry-Vitamin C-Vitamin E (CRANBERRY/VITAMIN C TRIPLE ST PO) Take by mouth.    . DEXILANT 60 MG capsule     . divalproex (DEPAKOTE) 500 MG DR tablet Take 1 tablet (500 mg total) by mouth 2 (two) times daily. DOSE INCREASE 60 tablet 1  . donepezil (ARICEPT) 10 MG tablet Take 1 tablet (10 mg total) by mouth at bedtime. 90 tablet 4  . donepezil (ARICEPT) 10 MG tablet Take by mouth.    . famotidine (PEPCID) 40 MG tablet     . fludrocortisone (FLORINEF) 0.1 MG tablet Take 2 tablets by mouth two times daily 360 tablet 2  . gabapentin (NEURONTIN) 300 MG capsule Take 1 capsule by mouth two times daily 180 capsule 2  . hydroxychloroquine (PLAQUENIL) 200 MG tablet     . hydrOXYzine (VISTARIL) 25 MG capsule Take 1 capsule (25 mg total) by mouth daily as needed (for severe anxiety). Anxiety/agitation- please limit use 90 capsule 0  . levothyroxine (SYNTHROID, LEVOTHROID) 175 MCG tablet Take 1 tablet (175 mcg total) by mouth daily before  breakfast. 90 tablet 3  . loperamide (IMODIUM A-D) 2 MG tablet Take 1 tablet (2 mg total) by mouth 4 (four) times daily as needed for diarrhea or loose stools. 30 tablet 0  . meloxicam (MOBIC) 15 MG tablet     . omeprazole (PRILOSEC) 20 MG capsule Take 1 capsule (20 mg total) by mouth daily. 180 capsule 3  . ondansetron (ZOFRAN-ODT) 4 MG disintegrating tablet Take 1 tablet (4 mg total) by mouth every 8 (eight) hours as needed for nausea or vomiting. 20 tablet 0  . SUMAtriptan (IMITREX) 100 MG tablet     . Suvorexant (BELSOMRA) 10 MG TABS Take 10 mg by mouth at bedtime. 30 tablet 1  . traMADol (ULTRAM) 50 MG tablet     . vitamin E 400 UNIT capsule Take 400 Units by mouth daily.     No current facility-administered medications for this visit.     Musculoskeletal: Strength & Muscle Tone: UTA Gait & Station: normal Patient leans: N/A  Psychiatric Specialty Exam: Review of Systems  Psychiatric/Behavioral: Positive for dysphoric mood and sleep disturbance. The patient is nervous/anxious.   All other systems reviewed and are negative.   There were no vitals taken for this visit.There is no height or weight on file to calculate BMI.  General Appearance: Casual  Eye Contact:  Fair  Speech:  Normal Rate  Volume:  Normal  Mood:  Anxious and Dysphoric  Affect:  Congruent  Thought Process:  Goal Directed and Descriptions of Associations: Intact  Orientation:  Full (Time, Place, and Person)  Thought Content: Logical   Suicidal Thoughts:  No  Homicidal Thoughts:  No  Memory:  Immediate;   Fair Recent;   Fair Remote;   Fair  Judgement:  Fair  Insight:  Fair  Psychomotor Activity:  Normal  Concentration:  Concentration: Fair and Attention Span: Fair  Recall:  [employer] of Knowledge: Fair  Language: Fair  Akathisia:  No  Handed:  Right  AIMS (if indicated): UTA  Assets:  Communication Skills Desire for Improvement Housing Intimacy Social Support  ADL's:  Intact  Cognition: WNL   Sleep:  Restless   Screenings: AIMS     Office Visit from 06/15/2018 in [facility] Office Visit from 05/20/2018 in [facility] Total Score  5  9    Mini-Mental     Office Visit from 01/01/2016 in [facility] Office Visit from 11/13/2015 in [facility] Office Visit from 10/26/2015 in [facility] Office Visit from 04/25/2015 in [facility] Office Visit from 10/25/2014 in [facility]  Total Score (max 30 points )  25  26  21  23  29     PHQ2-9     Office Visit from 02/08/2015 in [facility] Office Visit from 10/07/2014 in [facility]  PHQ-2 Total Score  0  4  PHQ-9 Total Score  --  8       Assessment and Plan: Stacy [name] is a 59 year old Caucasian female who has a history of bipolar disorder type II, neuroleptic induced parkinsonism, hypothyroidism, cognitive disorder, insomnia, fibromyalgia, Sjogren's syndrome was evaluated by telemedicine today.  Patient with psychosocial stressors of her grandson's cancer diagnosis, relocation, health problems, continues to struggle with anxiety, mood lability although making progress.  Plan as noted below.  Plan Bipolar disorder type II-improving Increase Depakote DR to 500 mg p.o. twice daily Discontinue Seroquel for concerns of weight gain. Start Belsomra 10 mg p.o. nightly. Continue Wellbutrin at low dosage of 75 mg p.o. in the morning.  GAD-some progress BuSpar 10 mg p.o. twice daily Patient advised to continue psychotherapy sessions.  Insomnia-restless Discontinue Seroquel. Start Belsomra 10 mg p.o. nightly  Neuroleptic induced movement disorder-improving We will monitor closely  Mild cognitive disorder-she will  follow-up with neurology.  High risk medication use-will order Depakote level.  Discussed and reviewed most recent Depakote  level-10-subtherapeutic dated 07/09/2019. CMP-creatinine slightly elevated at 1.06 otherwise within normal limits, CBC with differential-within normal limits.  Follow-up in clinic in 4 weeks or sooner if needed.  I have spent atleast 30 minutes non face to face with patient today. More than 50 % of the time was spent for preparing to see the patient ( e.g., review of test, records ), obtaining and to review and separately obtained history , ordering medications and test ,psychoeducation and supportive psychotherapy and care coordination,as well as documenting clinical information in electronic health record, interpreting results and communication with patient. This note was generated in part or whole with voice recognition software. Voice recognition is usually quite accurate but there are transcription errors that can and very often do occur. I apologize for any typographical errors that were not detected and corrected.          Ursula Alert, MD 07/30/2019, 12:31 PM

## 2019-07-30 NOTE — Telephone Encounter (Signed)
mailed lab work orders to pt.

## 2019-08-16 ENCOUNTER — Ambulatory Visit: Payer: Medicare HMO | Admitting: Psychology

## 2019-08-27 ENCOUNTER — Other Ambulatory Visit: Payer: Self-pay

## 2019-08-27 ENCOUNTER — Telehealth: Payer: Self-pay

## 2019-08-27 ENCOUNTER — Ambulatory Visit (INDEPENDENT_AMBULATORY_CARE_PROVIDER_SITE_OTHER): Payer: Medicare HMO | Admitting: Licensed Clinical Social Worker

## 2019-08-27 DIAGNOSIS — F3181 Bipolar II disorder: Secondary | ICD-10-CM

## 2019-08-27 DIAGNOSIS — F411 Generalized anxiety disorder: Secondary | ICD-10-CM | POA: Diagnosis not present

## 2019-08-27 NOTE — Progress Notes (Signed)
Virtual Visit via Video Note  I connected with Stacy [name] on 08/27/19 at 11:00 AM EDT by a video enabled telemedicine application and verified that I am speaking with the correct person using two identifiers.  Location: Patient: home  Provider: [facility]   I discussed the limitations of evaluation and management by telemedicine and the availability of in person appointments. The patient expressed understanding and agreed to proceed.   The patient was advised to call back or seek an in-person evaluation if the symptoms worsen or if the condition fails to improve as anticipated.  I provided 29 minutes of non-face-to-face time during this encounter.   [name], LCSW    THERAPIST PROGRESS NOTE  Session Time: 11:00-11:29 am  Participation Level: Active  Behavioral Response: Neat and Well GroomedAlertAnxious  Type of Therapy: Individual Therapy  Treatment Goals addressed: Anxiety and Coping  Interventions: Psychosocial Skills: anxiety management and Supportive  Summary: Stacy [name] is a 59 y.o. female who presents with improving mood symptoms and improving management of anxiety/stress. Pt reports that she recently had to stop taking depakote due to GI upset. Pt reports that she called office today to report that she d/c meds and is awaiting phone call back.   Pt, husband, and dog recently went to [place] to visit family--explored pts thoughts and feelings associated with the trip, and explored relationships with family members.   Discussed pts grandson with cancer, and current treatment (chemo + 24 hour hospital stays w/ each tx). Family optimistic about treatment tolerance and pt reports that he has a positive outlook. Discussed pts youngest son--pt reached out to him prior to her visit to let him know she would be there hoping to visit other grandchildren; youngest son never communicated back with her. Pt and oldest son feel frustrated about this.  Priority placed right  now to get mood medication in balance--phone call request with psychiatrist at the moment. Encouraged pt to focus on overall self care and life balance.   Suicidal/Homicidal: No  Therapist Response: Stacy [name] is continuing to make good progress with self understanding and managing overall anxiety.   Plan: Return again in 4 weeks.LCSW assisted with scheduling follow up OPT appointment.   Diagnosis: Axis I: Bipolar, Depressed and Generalized Anxiety Disorder    Axis II: No diagnosis    Rachel Bo Tymir Terral, LCSW 08/27/2019

## 2019-08-27 NOTE — Telephone Encounter (Signed)
Completed genesight request

## 2019-08-27 NOTE — Telephone Encounter (Signed)
pt states that she can not take the depakote.  she states that she was on vaccation and become sick with N/V  and the doctor told her to stop takin the medication. pt also wants a gene testing done form will be in your basket to fill out

## 2019-09-01 ENCOUNTER — Telehealth (INDEPENDENT_AMBULATORY_CARE_PROVIDER_SITE_OTHER): Payer: Medicare HMO | Admitting: Psychiatry

## 2019-09-01 ENCOUNTER — Encounter: Payer: Self-pay | Admitting: Psychiatry

## 2019-09-01 ENCOUNTER — Other Ambulatory Visit: Payer: Self-pay

## 2019-09-01 DIAGNOSIS — F0281 Dementia in other diseases classified elsewhere with behavioral disturbance: Secondary | ICD-10-CM | POA: Diagnosis not present

## 2019-09-01 DIAGNOSIS — F02A18 Dementia in other diseases classified elsewhere, mild, with other behavioral disturbance: Secondary | ICD-10-CM

## 2019-09-01 DIAGNOSIS — F5105 Insomnia due to other mental disorder: Secondary | ICD-10-CM | POA: Diagnosis not present

## 2019-09-01 DIAGNOSIS — F411 Generalized anxiety disorder: Secondary | ICD-10-CM

## 2019-09-01 DIAGNOSIS — F3181 Bipolar II disorder: Secondary | ICD-10-CM | POA: Diagnosis not present

## 2019-09-01 MED ORDER — BUSPIRONE HCL 10 MG PO TABS: 10.0000 mg | ORAL_TABLET | Freq: Two times a day (BID) | ORAL | 1 refills | Status: DC

## 2019-09-01 NOTE — Progress Notes (Signed)
Provider Location : [facility] Patient Location : Home  Virtual Visit via Video Note  I connected with Stacy [name] on 09/01/19 at  8:45 AM EDT by a video enabled telemedicine application and verified that I am speaking with the correct person using two identifiers.   I discussed the limitations of evaluation and management by telemedicine and the availability of in person appointments. The patient expressed understanding and agreed to proceed.     I discussed the assessment and treatment plan with the patient. The patient was provided an opportunity to ask questions and all were answered. The patient agreed with the plan and demonstrated an understanding of the instructions.   The patient was advised to call back or seek an in-person evaluation if the symptoms worsen or if the condition fails to improve as anticipated.  Stacy Corner MD OP Progress Note  09/01/2019 9:49 AM Stacy [name]  MRN:  [id number]  Chief Complaint:  Chief Complaint    Follow-up     EVO:[id number] Stacy [name] is a 59 year old Caucasian female, unemployed, married, lives in [place], has a history of bipolar disorder, insomnia, Sjogren's syndrome, cognitive disorder, hypertension, hypothyroidism, arthritis, migraine headaches was evaluated by telemedicine today.  Patient reports she continues to struggle with anxiety and mood mood swings.  She however had to stop taking the Depakote due to vomiting.  She reports she does not want to make any changes with her medications and wants to get the GeneSight testing done before making further changes.  She reports sleep is okay on the Belsomra.  She reports auditory hallucinations-hear people talking and having a conversation.  She reports she is able to cope with it and it does not bother her much.  She was tried on Seroquel previously however she had side effects to the same including abnormal involuntary movements around her mouth as well as weight gain.  Patient denies any  suicidality or homicidality.  Patient reports she was evaluated by neurology recently and was told that her mood symptoms and other psychiatric symptoms are likely also related to her cognitive disorder.  She is currently on Aricept however continues to struggle with memory changes.  She reports she does not take care of her finances anymore and does not cook.  She reports she makes careless mistakes and is very forgetful.  Patient however was able to answer questions appropriately today and appeared to be alert oriented to person place time and situation.  Patient also has a history of seizure-like spells.  She will continue to follow-up with neurology for the same.    Visit Diagnosis:    ICD-10-CM   1. Bipolar 2 disorder (HCC)  F31.81    mixed, mild  2. GAD (generalized anxiety disorder)  F41.1   3. Insomnia due to mental disorder  F51.05 busPIRone (BUSPAR) 10 MG tablet  4. 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  Past trials of Paxil, Wellbutrin, Latuda, Lamictal, Depakote, paroxetine, Seroquel  Past Medical History:  Past Medical History:  Diagnosis Date  . Aneurysm ([place])   . Anxiety   . Arthritis   . Brain stem lesion   . Chronic kidney disease    kideny stone  . Depression   . Fibromyalgia   . GERD (gastroesophageal reflux disease)   . Headache   . Hypotension   . Hypothyroid   . Memory loss   .  Migraine   . Raynaud disease   . Sjogren's syndrome ([place])   . Thyroid disease     Past Surgical History:  Procedure Laterality Date  . ABDOMINAL HYSTERECTOMY    . BASAL CELL CARCINOMA EXCISION    . BREAST BIOPSY    . CESAREAN SECTION    . EYE SURGERY    . KNEE SURGERY Left   . LITHOTRIPSY    . MUSCLE BIOPSY    . OOPHORECTOMY      Family Psychiatric History: I have reviewed family psychiatric  history from my progress note on 12/10/2017.  Family History:  Family History  Problem Relation Age of Onset  . Migraines Mother   . Anxiety disorder Mother   . Depression Mother   . Cancer Father   . Hypertension Father   . Stroke Father   . Heart attack Father   . Alcohol abuse Father   . Diabetes Brother   . Alcohol abuse Sister   . Drug abuse Sister   . Anxiety disorder Sister   . Depression Sister   . Drug abuse Brother   . Cancer Paternal Aunt   . Lupus Paternal Aunt   . Thyroid disease Paternal Aunt   . Diabetes Maternal Grandfather   . Anxiety disorder Maternal Grandmother   . Depression Maternal Grandmother     Social History: I have reviewed social history from my progress note on 12/10/2017. Social History   Socioeconomic History  . Marital status: Married    Spouse name: Stacy [name]  . Number of children: 2  . Years of education: 40  . Highest education level: Associate degree: occupational, Hotel manager, or vocational program  Occupational History  . Occupation: Disabled  Tobacco Use  . Smoking status: Never Smoker  . Smokeless tobacco: Never Used  Vaping Use  . Vaping Use: Never used  Substance and Sexual Activity  . Alcohol use: No    Alcohol/week: 0.0 standard drinks  . Drug use: Never  . Sexual activity: Yes    Birth control/protection: None  Other Topics Concern  . Not on file  Social History Narrative   Lives at home with husband. Stacy [name]).    Disabled.   Education college.   Right handed.   2 cups caffeine/daily   Social Determinants of Health   Financial Resource Strain:   . Difficulty of Paying Living Expenses:   Food Insecurity:   . Worried About Charity fundraiser in the Last Year:   . Arboriculturist in the Last Year:   Transportation Needs:   . Film/video editor (Medical):   Marland Kitchen Lack of Transportation (Non-Medical):   Physical Activity:   . Days of Exercise per Week:   . Minutes of Exercise per Session:   Stress:   .  Feeling of Stress :   Social Connections:   . Frequency of Communication with Friends and Family:   . Frequency of Social Gatherings with Friends and Family:   . Attends Religious Services:   . Active Member of Clubs or Organizations:   . Attends Archivist Meetings:   Marland Kitchen Marital Status:     Allergies:  Allergies  Allergen Reactions  . Penicillins Hives  . Amoxicillin Hives  . Depakote [Divalproex Sodium]     vomiting  . Lamictal [Lamotrigine]     Side effects of abdominal cramps, nausea , diarrhea ,nightmares    Metabolic Disorder Labs: Lab Results  Component Value Date   HGBA1C 5.0 11/13/2015  No results found for: PROLACTIN No results found for: CHOL, TRIG, HDL, CHOLHDL, VLDL, LDLCALC Lab Results  Component Value Date   TSH 1.780 10/26/2015   TSH 0.525 03/15/2015    Therapeutic Level Labs: No results found for: LITHIUM Lab Results  Component Value Date   VALPROATE 10 (L) 07/28/2019   No components found for:  CBMZ  Current Medications: Current Outpatient Medications  Medication Sig Dispense Refill  . alendronate (FOSAMAX) 70 MG tablet     . buPROPion (WELLBUTRIN) 75 MG tablet Take 1 tablet (75 mg total) by mouth every morning. 90 tablet 0  . Calcium Carbonate-Vitamin D 600-200 MG-UNIT TABS Take by mouth.    . Calcium-Vitamin D 600-200 MG-UNIT per tablet Take by mouth.    . Cholecalciferol (VITAMIN D-3) 1000 units CAPS Take by mouth daily.    . Cranberry-Vitamin C-Vitamin E (CRANBERRY/VITAMIN C TRIPLE ST PO) Take by mouth.    . DEXILANT 60 MG capsule     . donepezil (ARICEPT) 10 MG tablet Take 10 mg by mouth every morning.    . famotidine (PEPCID) 40 MG tablet     . fludrocortisone (FLORINEF) 0.1 MG tablet Take 2 tablets by mouth two times daily 360 tablet 2  . gabapentin (NEURONTIN) 300 MG capsule Take 1 capsule by mouth two times daily 180 capsule 2  . hydroxychloroquine (PLAQUENIL) 200 MG tablet     . hydrOXYzine (VISTARIL) 25 MG capsule Take 1  capsule (25 mg total) by mouth daily as needed (for severe anxiety). Anxiety/agitation- please limit use 90 capsule 0  . levothyroxine (SYNTHROID, LEVOTHROID) 175 MCG tablet Take 1 tablet (175 mcg total) by mouth daily before breakfast. 90 tablet 3  . loperamide (IMODIUM A-D) 2 MG tablet Take 1 tablet (2 mg total) by mouth 4 (four) times daily as needed for diarrhea or loose stools. 30 tablet 0  . meloxicam (MOBIC) 15 MG tablet     . omeprazole (PRILOSEC) 20 MG capsule Take 1 capsule (20 mg total) by mouth daily. 180 capsule 3  . ondansetron (ZOFRAN-ODT) 4 MG disintegrating tablet Take 1 tablet (4 mg total) by mouth every 8 (eight) hours as needed for nausea or vomiting. 20 tablet 0  . SUMAtriptan (IMITREX) 100 MG tablet     . Suvorexant (BELSOMRA) 10 MG TABS Take 10 mg by mouth at bedtime. 30 tablet 1  . traMADol (ULTRAM) 50 MG tablet     . vitamin E 400 UNIT capsule Take 400 Units by mouth daily.    . busPIRone (BUSPAR) 10 MG tablet Take 1 tablet (10 mg total) by mouth 2 (two) times daily. 60 tablet 1   No current facility-administered medications for this visit.     Musculoskeletal: Strength & Muscle Tone: UTA Gait & Station: normal Patient leans: N/A  Psychiatric Specialty Exam: Review of Systems  Psychiatric/Behavioral: Positive for hallucinations. The patient is nervous/anxious.   All other systems reviewed and are negative.   There were no vitals taken for this visit.There is no height or weight on file to calculate BMI.  General Appearance: Casual  Eye Contact:  Fair  Speech:  Clear and Coherent  Volume:  Normal  Mood:  Anxious  Affect:  Congruent  Thought Process:  Goal Directed and Descriptions of Associations: Intact  Orientation:  Full (Time, Place, and Person)  Thought Content: Hallucinations: Auditory - voices having conversations- able to cope  Suicidal Thoughts:  No  Homicidal Thoughts:  No  Memory:  Immediate;   Fair Recent;  Fair Remote;   Fair  Judgement:   Fair  Insight:  Fair  Psychomotor Activity:  Normal  Concentration:  Concentration: Good and Attention Span: Fair  Recall:  [employer] of Knowledge: Fair  Language: Fair  Akathisia:  No  Handed:  Right  AIMS (if indicated):UTA  Assets:  Communication Skills Desire for Improvement Housing Social Support  ADL's:  Intact  Cognition: WNL  Sleep:  Fair   Screenings: AIMS     Office Visit from 06/15/2018 in [facility] Office Visit from 05/20/2018 in [facility] Total Score 5 9    Mini-Mental     Office Visit from 01/01/2016 in [facility] Office Visit from 11/13/2015 in [facility] Office Visit from 10/26/2015 in [facility] Office Visit from 04/25/2015 in [facility] Office Visit from 10/25/2014 in [facility]  Total Score (max 30 points ) 25 26 21 23 29     PHQ2-9     Office Visit from 02/08/2015 in [facility] Office Visit from 10/07/2014 in [facility]  PHQ-2 Total Score 0 4  PHQ-9 Total Score -- 8       Assessment and Plan: [name] Fehrenbach is a 59 year old Caucasian female who has a history of bipolar disorder type II, neuroleptic induced parkinsonian syndrome, hypothyroidism, cognitive disorder, insomnia, fibromyalgia, Sjogren's syndrome was evaluated by telemedicine today.  Patient with psychosocial stressors of her grandson's cancer diagnosis, recent relocation, her own physical problems.  Patient however is very sensitive to medications in general and has developed side effects to a lot of medications that were tried.  Patient however is currently awaiting GeneSight testing.  Discussed plan as noted below.  Plan Bipolar disorder type II-some progress Although patient does have neurocognitive disorder which may have been progressing since the past few years, based on H&P notes from her  intake evaluation with writer on 12/10/2017-patient did report having hypomanic symptoms as well as depressive symptoms all her life even prior to her neurocognitive changes.  Hence it is likely that she also meets criteria for bipolar disorder and current mood symptoms are not all due to her neurocognitive changes. Discontinue Depakote for side effects. Continue Wellbutrin at low dosage of 75 mg p.o. daily Belsomra 10 mg p.o. nightly  GAD-some progress BuSpar 10 mg p.o. twice daily Patient advised to continue psychotherapy sessions with our therapist Ms. [name].  Insomnia-improving Belsomra 10 mg p.o. nightly  Neurocognitive disorder with behavioral changes-unstable Patient will continue to follow-up with neurology.  I have reviewed notes per Dr. Melrose Nakayama, Ms.Konz.    Will order GeneSight testing-given the fact the patient has developed side effects to multiple medication trials.  Follow-up in clinic in 4- 6 weeks or sooner if needed.  I have spent atleast 20 minutes non face to face with patient today. More than 50 % of the time was spent for preparing to see the patient ( e.g., review of test, records ), obtaining and to review and separately obtained history , ordering medications and test ,psychoeducation and supportive psychotherapy and care coordination,as well as documenting clinical information in electronic health record. This note was generated in part or whole with voice recognition software. Voice recognition is usually quite accurate but there are transcription errors that can and very often do occur. I apologize for any typographical errors that were not detected and corrected.        Ursula Alert, MD 09/01/2019, 9:49 AM

## 2019-09-08 ENCOUNTER — Ambulatory Visit: Payer: Medicare HMO | Admitting: Psychology

## 2019-09-24 ENCOUNTER — Other Ambulatory Visit: Payer: Self-pay

## 2019-09-24 ENCOUNTER — Ambulatory Visit (INDEPENDENT_AMBULATORY_CARE_PROVIDER_SITE_OTHER): Payer: Medicare HMO | Admitting: Licensed Clinical Social Worker

## 2019-09-24 DIAGNOSIS — F3181 Bipolar II disorder: Secondary | ICD-10-CM

## 2019-09-24 DIAGNOSIS — F411 Generalized anxiety disorder: Secondary | ICD-10-CM

## 2019-09-24 NOTE — Progress Notes (Signed)
Virtual Visit via Video Note  I connected with Stacy [name] on 09/24/19 at 11:00 AM EDT by a video enabled telemedicine application and verified that I am speaking with the correct person using two identifiers.  Location: Patient: home Provider: remote office [place], [place])   I discussed the limitations of evaluation and management by telemedicine and the availability of in person appointments. The patient expressed understanding and agreed to proceed.   I discussed the assessment and treatment plan with the patient. The patient was provided an opportunity to ask questions and all were answered. The patient agreed with the plan and demonstrated an understanding of the instructions.   The patient was advised to call back or seek an in-person evaluation if the symptoms worsen or if the condition fails to improve as anticipated.  I provided 20 minutes of non-face-to-face time during this encounter.   Norely Schlick R Darly Massi, LCSW    THERAPIST PROGRESS NOTE  Session Time: 11:00-11:20 am  Participation Level: Minimal  Behavioral Response: Neat and Well GroomedAlertDepressed  Type of Therapy: Individual Therapy  Treatment Goals addressed: Coping  Interventions: CBT and Supportive  Summary: Stacy [name] is a 59 y.o. female who presents with continuing symptoms related to her diagnosis. Pt recently had testing done to see what medications would be a good fit to manage overall symptoms. Pt reports significant side effects from past medications. Discussed interventions that pt could try (increase physical activity, engage more socially, stimulating environments) to help manage mood while not being on medications. Encouraged pt to keep an eye on daily structure/schedule and to intervene early if pt notices hypersomnia, eating changes, etc. Emphasized importance of overall self awareness and self care. Encouraged pt to call with any concerns. Has psychiatric appt early August.     Suicidal/Homicidal: No  Therapist Response: Stacy [name] reports that her mood is more depressed after stopping medications that had significant side effects.  This is indicative of fluctuating/intermittent progress. Reviewed strategies to help boost mood and manage stress/anxiety. Emphasized importance of overall self-care, social engagement, and daily structure.   Plan: Return again in 8 weeks.  Diagnosis: Axis I: Bipolar, Depressed    Axis II: No diagnosis    Stacy Bo Marston Mccadden, LCSW 09/24/2019

## 2019-10-04 ENCOUNTER — Telehealth (INDEPENDENT_AMBULATORY_CARE_PROVIDER_SITE_OTHER): Payer: Medicare HMO | Admitting: Psychiatry

## 2019-10-04 ENCOUNTER — Other Ambulatory Visit: Payer: Self-pay

## 2019-10-04 ENCOUNTER — Encounter: Payer: Self-pay | Admitting: Psychiatry

## 2019-10-04 DIAGNOSIS — F0281 Dementia in other diseases classified elsewhere with behavioral disturbance: Secondary | ICD-10-CM

## 2019-10-04 DIAGNOSIS — F3181 Bipolar II disorder: Secondary | ICD-10-CM

## 2019-10-04 DIAGNOSIS — F411 Generalized anxiety disorder: Secondary | ICD-10-CM | POA: Diagnosis not present

## 2019-10-04 DIAGNOSIS — F02A18 Dementia in other diseases classified elsewhere, mild, with other behavioral disturbance: Secondary | ICD-10-CM | POA: Insufficient documentation

## 2019-10-04 DIAGNOSIS — F5105 Insomnia due to other mental disorder: Secondary | ICD-10-CM | POA: Diagnosis not present

## 2019-10-04 MED ORDER — OLANZAPINE 2.5 MG PO TABS: 2.5000 mg | ORAL_TABLET | Freq: Every day | ORAL | 1 refills | Status: DC

## 2019-10-04 MED ORDER — BUSPIRONE HCL 10 MG PO TABS: 10.0000 mg | ORAL_TABLET | Freq: Two times a day (BID) | ORAL | 1 refills | Status: DC

## 2019-10-04 NOTE — Progress Notes (Signed)
Provider Location : [facility] Patient Location : Home  Virtual Visit via Video Note  I connected with Stacy [name] on 10/04/19 at  8:30 AM EDT by a video enabled telemedicine application and verified that I am speaking with the correct person using two identifiers.   I discussed the limitations of evaluation and management by telemedicine and the availability of in person appointments. The patient expressed understanding and agreed to proceed.   I discussed the assessment and treatment plan with the patient. The patient was provided an opportunity to ask questions and all were answered. The patient agreed with the plan and demonstrated an understanding of the instructions.   The patient was advised to call back or seek an in-person evaluation if the symptoms worsen or if the condition fails to improve as anticipated.   Stacy Heights MD OP Progress Note  10/04/2019 4:24 PM Stacy [name]  MRN:  [id number]  Chief Complaint:  Chief Complaint    Follow-up     HPI: Stacy [name] is a 59 year old Caucasian female, unemployed, married, lives in [place], has a history of bipolar disorder, Sjogren's syndrome, cognitive disorder, hypertension, hypothyroidism, arthritis, migraine headaches, was evaluated by telemedicine today.  Patient today reports she stopped taking her Wellbutrin, BuSpar and Belsomra due to worsening nightmares at night.  She reports she would wake up with nightmares and this affected her sleep.  She did not know what was contributing to it and hence stopped all her medications.  Since stopping her medications nightmares have improved however she continues to struggle with sleep.  She is able to sleep only 4 to 5 hours at night and she wakes up too early.  Patient reports she continues to have auditory hallucinations, hears people talking and having a conversation.  She denies any shadows or other visual hallucinations at this time.  Patient denies any abnormal involuntary movements  around her mouth at this time.  That has improved.  She continues to be under the care of neurology who is currently following up with her for possible neurocognitive disorder, rule out Parkinson's disease, rule out Lewy body dementia.  Per neurology recommendations , was advised to start Lamictal however patient has already developed side effects to Lamictal in the past and has failed the trial.  Discussed starting Zyprexa for her mood, sleep and psychosis.  Patient provided education.  She is agreeable.  She denies any suicidality or homicidality.    Visit Diagnosis:    ICD-10-CM   1. Bipolar 2 disorder (HCC)  F31.81 OLANZapine (ZYPREXA) 2.5 MG tablet   mixed, mild  2. GAD (generalized anxiety disorder)  F41.1 OLANZapine (ZYPREXA) 2.5 MG tablet  3. Insomnia due to mental disorder  F51.05 OLANZapine (ZYPREXA) 2.5 MG tablet    busPIRone (BUSPAR) 10 MG tablet  4. 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  Past trials of Paxil, Wellbutrin, Latuda, Lamictal, Seroquel, Depakote, Paxil, Belsomra  Past Medical History:  Past Medical History:  Diagnosis Date   Aneurysm ([place])    Anxiety    Arthritis    Brain stem lesion    Chronic kidney disease    kideny stone   Depression    Fibromyalgia    GERD (gastroesophageal reflux disease)    Headache    Hypotension    Hypothyroid    Memory loss    Migraine    Raynaud  disease    Sjogren's syndrome ([place])    Thyroid disease     Past Surgical History:  Procedure Laterality Date   ABDOMINAL HYSTERECTOMY     BASAL CELL CARCINOMA EXCISION     BREAST BIOPSY     CESAREAN SECTION     EYE SURGERY     KNEE SURGERY Left    LITHOTRIPSY     MUSCLE BIOPSY     OOPHORECTOMY      Family Psychiatric History: I have reviewed family psychiatric history from  my progress note on 12/10/2017  Family History:  Family History  Problem Relation Age of Onset   Migraines Mother    Anxiety disorder Mother    Depression Mother    Cancer Father    Hypertension Father    Stroke Father    Heart attack Father    Alcohol abuse Father    Diabetes Brother    Alcohol abuse Sister    Drug abuse Sister    Anxiety disorder Sister    Depression Sister    Drug abuse Brother    Cancer Paternal Aunt    Lupus Paternal Aunt    Thyroid disease Paternal Aunt    Diabetes Maternal Grandfather    Anxiety disorder Maternal Grandmother    Depression Maternal Grandmother     Social History: Reviewed social history from my progress note on 12/10/2017 Social History   Socioeconomic History   Marital status: Married    Spouse name: Stacy [name]   Number of children: 2   Years of education: 14   Highest education level: Associate degree: occupational, Hotel manager, or vocational program  Occupational History   Occupation: Disabled  Tobacco Use   Smoking status: Never Smoker   Smokeless tobacco: Never Used  Scientific laboratory technician Use: Never used  Substance and Sexual Activity   Alcohol use: No    Alcohol/week: 0.0 standard drinks   Drug use: Never   Sexual activity: Yes    Birth control/protection: None  Other Topics Concern   Not on file  Social History Narrative   Lives at home with husband. Stacy [name]).    Disabled.   Education college.   Right handed.   2 cups caffeine/daily   Social Determinants of Health   Financial Resource Strain:    Difficulty of Paying Living Expenses:   Food Insecurity:    Worried About Charity fundraiser in the Last Year:    Arboriculturist in the Last Year:   Transportation Needs:    Film/video editor (Medical):    Lack of Transportation (Non-Medical):   Physical Activity:    Days of Exercise per Week:    Minutes of Exercise per Session:   Stress:    Feeling of Stress :    Social Connections:    Frequency of Communication with Friends and Family:    Frequency of Social Gatherings with Friends and Family:    Attends Religious Services:    Active Member of Clubs or Organizations:    Attends Archivist Meetings:    Marital Status:     Allergies:  Allergies  Allergen Reactions   Penicillins Hives   Amoxicillin Hives   Depakote [Divalproex Sodium]     vomiting   Lamictal [Lamotrigine]     Side effects of abdominal cramps, nausea , diarrhea ,nightmares    Metabolic Disorder Labs: Lab Results  Component Value Date   HGBA1C 5.0 11/13/2015   No results found for: PROLACTIN No results  found for: CHOL, TRIG, HDL, CHOLHDL, VLDL, LDLCALC Lab Results  Component Value Date   TSH 1.780 10/26/2015   TSH 0.525 03/15/2015    Therapeutic Level Labs: No results found for: LITHIUM Lab Results  Component Value Date   VALPROATE 10 (L) 07/28/2019   No components found for:  CBMZ  Current Medications: Current Outpatient Medications  Medication Sig Dispense Refill   busPIRone (BUSPAR) 10 MG tablet Take 1 tablet (10 mg total) by mouth 2 (two) times daily. 60 tablet 1   Cholecalciferol (VITAMIN D-3) 1000 units CAPS Take by mouth daily.     donepezil (ARICEPT) 10 MG tablet Take 10 mg by mouth every morning.     gabapentin (NEURONTIN) 300 MG capsule Take 1 capsule by mouth two times daily 180 capsule 2   hydroxychloroquine (PLAQUENIL) 200 MG tablet      hydrOXYzine (VISTARIL) 25 MG capsule Take 1 capsule (25 mg total) by mouth daily as needed (for severe anxiety). Anxiety/agitation- please limit use 90 capsule 0   levothyroxine (SYNTHROID, LEVOTHROID) 175 MCG tablet Take 1 tablet (175 mcg total) by mouth daily before breakfast. 90 tablet 3   loperamide (IMODIUM A-D) 2 MG tablet Take 1 tablet (2 mg total) by mouth 4 (four) times daily as needed for diarrhea or loose stools. 30 tablet 0   OLANZapine (ZYPREXA) 2.5 MG tablet Take 1  tablet (2.5 mg total) by mouth at bedtime. For mood, sleep, hallucinations 15 tablet 1   omeprazole (PRILOSEC) 20 MG capsule Take 1 capsule (20 mg total) by mouth daily. 180 capsule 3   SUMAtriptan (IMITREX) 100 MG tablet      No current facility-administered medications for this visit.     Musculoskeletal: Strength & Muscle Tone: UTA Gait & Station: normal Patient leans: N/A  Psychiatric Specialty Exam: Review of Systems  Psychiatric/Behavioral: Positive for dysphoric mood, hallucinations and sleep disturbance. The patient is nervous/anxious.   All other systems reviewed and are negative.   There were no vitals taken for this visit.There is no height or weight on file to calculate BMI.  General Appearance: Casual  Eye Contact:  Fair  Speech:  Clear and Coherent  Volume:  Normal  Mood:  Anxious and Depressed  Affect:  Tearful  Thought Process:  Goal Directed and Descriptions of Associations: Intact  Orientation:  Full (Time, Place, and Person)  Thought Content: Hallucinations: Auditory reports she feels as though she is hearing voices from someone who is with her in her room.  Having conversations with them.  Suicidal Thoughts:  No  Homicidal Thoughts:  No  Memory:  Immediate;   Fair Recent;   Fair Remote;   Fair  Judgement:  Fair  Insight:  Fair  Psychomotor Activity:  Normal  Concentration:  Concentration: Fair and Attention Span: Fair  Recall:  [employer] of Knowledge: Fair  Language: Fair  Akathisia:  No  Handed:  Right  AIMS (if indicated): UTA  Assets:  Communication Skills Desire for Improvement Housing Social Support  ADL's:  Intact  Cognition: WNL  Sleep:  Poor   Screenings: AIMS     Office Visit from 06/15/2018 in [facility] Office Visit from 05/20/2018 in [facility] Total Score 5 9    Mini-Mental     Office Visit from 01/01/2016 in [facility] Office Visit from  11/13/2015 in [facility] Office Visit from 10/26/2015 in [facility] Office Visit from 04/25/2015 in [facility] Office  Visit from 10/25/2014 in [facility]  Total Score (max 30 points ) 25 26 21 23 29     PHQ2-9     Office Visit from 02/08/2015 in [facility] Office Visit from 10/07/2014 in [facility]  PHQ-2 Total Score 0 4  PHQ-9 Total Score -- 8       Assessment and Plan: Avary Eichenberger is a 59 year old Caucasian female who has a history of bipolar disorder type II, neuroleptic induced parkinsonian syndrome, hypothyroidism, cognitive disorder, insomnia, fibromyalgia, Sjogren's syndrome, was evaluated by telemedicine today.  Patient with psychosocial stressors of her grandson's cancer diagnosis, recent relocation, her own physical problems.  Patient however is currently struggling with mood lability, psychosis, neurocognitive problems.  Plan as noted below.  Plan Bipolar disorder type II-unstable Start Zyprexa 2.5 mg p.o. nightly Provided medication education.  Discussed adverse side effects. Discontinue Wellbutrin for noncompliance Discontinue Belsomra for noncompliance  GAD-unstable Restart BuSpar 10 mg p.o. twice daily Continue psychotherapy sessions with our therapist Ms. [name]  Insomnia-unstable Discontinue Belsomra for noncompliance Zyprexa will also help with sleep  Neurocognitive disorder with behavioral changes-unstable Adding the mood stabilizer will likely help her with her behavioral problems as well as psychosis.  Based on review of records her psychosis likely due to neurocognitive issues and her neurologist is currently ruling out Parkinson's disease, Lewy body dementia.  Unable to say if psychosis is due to the bipolar do. Will monitor closely.  She will continue to follow-up with her neurologist.  We will continue to coordinate care.  I have  reviewed and discussed Gene[employer]ight testing results with patient today.  Educated patient about the results.  Medication changes were made based on testing today.  Follow-up in clinic in 4 weeks or sooner if needed.  I have spent atleast 30 minutes non face to face with patient today. More than 50 % of the time was spent for preparing to see the patient ( e.g., review of test, records ), obtaining and to review and separately obtained history , ordering medications and test ,psychoeducation and supportive psychotherapy and care coordination,as well as documenting clinical information in electronic health record,interpreting results of test and communication of results. This note was generated in part or whole with voice recognition software. Voice recognition is usually quite accurate but there are transcription errors that can and very often do occur. I apologize for any typographical errors that were not detected and corrected.        Ursula Alert, MD 10/04/2019, 4:24 PM

## 2019-10-04 NOTE — Patient Instructions (Signed)
Olanzapine tablets What is this medicine? OLANZAPINE (oh LAN za peen) is used to treat schizophrenia, psychotic disorders, and bipolar disorder. Bipolar disorder is also known as manic-depression. This medicine may be used for other purposes; ask your health care provider or pharmacist if you have questions. COMMON BRAND NAME(S): Zyprexa What should I tell my health care provider before I take this medicine? They need to know if you have any of these conditions:  breast cancer or history of breast cancer  cigarette smoker  dementia  diabetes mellitus, high blood sugar or a family history of diabetes  difficulty swallowing  glaucoma  heart disease, irregular heartbeat, or previous heart attack  history of brain tumor or head injury  kidney or liver disease  low blood pressure or dizziness when standing up  Parkinson's disease  prostate trouble  seizures (convulsions)  suicidal thoughts, plans, or attempt by you or a family member  an unusual or allergic reaction to olanzapine, other medicines, foods, dyes, or preservatives  pregnant or trying to get pregnant  breast-feeding How should I use this medicine? Take this medicine by mouth. Swallow it with a drink of water. Follow the directions on the prescription label. Take your medicine at regular intervals. Do not take it more often than directed. Do not stop taking except on the advice of your doctor or health care professional. A special MedGuide will be given to you by the pharmacist with each new prescription and refill. Be sure to read this information carefully each time. Talk to your pediatrician regarding the use of this medicine in children. While this drug may be prescribed for children as young as 13 years for selected conditions, precautions do apply. Overdosage: If you think you have taken too much of this medicine contact a poison control center or emergency room at once. NOTE: This medicine is only for you.  Do not share this medicine with others. What if I miss a dose? If you miss a dose, take it as soon as you can. If it is almost time for your next dose, take only that dose. Do not take double or extra doses. What may interact with this medicine? Do not take this medicine with any of the following medications:  certain antibiotics like grepafloxacin and sparfloxacin  certain phenothiazines like chlorpromazine, mesoridazine, and thioridazine  cisapride  clozapine  droperidol  halofantrine  levomethadyl  pimozide This medicine may also interact with the following medications:  carbamazepine  charcoal  fluvoxamine  levodopa and other medicines for Parkinson's disease  medicines for diabetes  medicines for high blood pressure  medicines for mental depression, anxiety, other mood disorders, or sleeping problems  omeprazole  rifampin  ritonavir  tobacco from cigarettes This list may not describe all possible interactions. Give your health care provider a list of all the medicines, herbs, non-prescription drugs, or dietary supplements you use. Also tell them if you smoke, drink alcohol, or use illegal drugs. Some items may interact with your medicine. What should I watch for while using this medicine? Visit your health care professional for regular checks on your progress. Tell your health care professional if symptoms do not start to get better or if they get worse. Do not stop taking except on your health care professional's advice. You may develop a severe reaction. Your health care professional will tell you how much medicine to take. This medicine may cause serious skin reactions. They can happen weeks to months after starting the medicine. Contact your health care provider  right away if you notice fevers or flu-like symptoms with a rash. The rash may be red or purple and then turn into blisters or peeling of the skin. Or, you might notice a red rash with swelling of the  face, lips or lymph nodes in your neck or under your arms. You may get dizzy or drowsy. Do not drive, use machinery, or do anything that needs mental alertness until you know how this medicine affects you. Do not stand or sit up quickly, especially if you are an older patient. This reduces the risk of dizzy or fainting spells. Alcohol may interfere with the effect of this medicine. Avoid alcoholic drinks. Your mouth may get dry. Chewing sugarless gum or sucking hard candy, and drinking plenty of water will help. This drug can cause problems with controlling your body temperature. It can lower the response of your body to cold temperatures. If possible, stay indoors during cold weather. If you must go outdoors, wear warm clothes. It can also lower the response of your body to heat. Do not overheat. Do not over-exercise. Stay out of the sun when possible. If you must be in the sun, wear cool clothing. Drink plenty of water. If you have trouble controlling your body temperature, call your health care provider right away. This medicine may increase blood sugar. Ask your health care provider if changes in diet or medicines are needed if you have diabetes. If you smoke, tell your doctor if you notice this medicine is not working well for you. Talk to your doctor if you are a smoker or if you decide to stop smoking. What side effects may I notice from receiving this medicine? Side effects that you should report to your doctor or health care professional as soon as possible:  allergic reactions like skin rash, itching or hives, swelling of the face, lips, or tongue  breast enlargement in both males and females  breathing problems  confusion  fast, irregular heartbeat  fever or chills, sore throat  inability to keep still  males: prolonged or painful erection  missed menstrual periods  problems with balance, talking, walking  rash, fever, and swollen lymph nodes  redness, blistering, peeling or  loosening of the skin, including inside the mouth  seizures  signs and symptoms of high blood sugar such as being more thirsty or hungry or having to urinate more than normal. You may also feel very tired or have blurry vision  signs and symptoms of low blood pressure like dizziness; feeling faint or lightheaded, falls; unusually weak or tired  signs and symptoms of neuroleptic malignant syndrome like confusion; fast or irregular heartbeat; high fever; increased sweating; stiff muscles  sudden numbness or weakness of the face, arm, or leg  suicidal thoughts or other mood changes  trouble swallowing  uncontrollable movements of the arms, face, head, mouth, neck, or upper body Side effects that usually do not require medical attention (report to your doctor or health care professional if they continue or are bothersome):  change in sex drive or performance  constipation  drowsiness  weight gain This list may not describe all possible side effects. Call your doctor for medical advice about side effects. You may report side effects to FDA at 1-800-FDA-1088. Where should I keep my medicine? Keep out of the reach of children. Store at controlled room temperature between 15 and 30 degrees C (59 and 86 degrees F). Protect from light and moisture. Throw away any unused medicine after the expiration date.  NOTE: This sheet is a summary. It may not cover all possible information. If you have questions about this medicine, talk to your doctor, pharmacist, or health care provider.  2020 Elsevier/Gold Standard (2018-12-09 11:41:27)

## 2019-11-02 ENCOUNTER — Encounter: Payer: Self-pay | Admitting: Psychiatry

## 2019-11-02 ENCOUNTER — Telehealth (INDEPENDENT_AMBULATORY_CARE_PROVIDER_SITE_OTHER): Payer: Medicare HMO | Admitting: Psychiatry

## 2019-11-02 ENCOUNTER — Other Ambulatory Visit: Payer: Self-pay

## 2019-11-02 DIAGNOSIS — F3181 Bipolar II disorder: Secondary | ICD-10-CM | POA: Diagnosis not present

## 2019-11-02 DIAGNOSIS — F0281 Dementia in other diseases classified elsewhere with behavioral disturbance: Secondary | ICD-10-CM

## 2019-11-02 DIAGNOSIS — F411 Generalized anxiety disorder: Secondary | ICD-10-CM

## 2019-11-02 DIAGNOSIS — F5105 Insomnia due to other mental disorder: Secondary | ICD-10-CM | POA: Diagnosis not present

## 2019-11-02 DIAGNOSIS — F02A18 Dementia in other diseases classified elsewhere, mild, with other behavioral disturbance: Secondary | ICD-10-CM

## 2019-11-02 MED ORDER — CARIPRAZINE HCL 1.5 MG PO CAPS: 1.5000 mg | ORAL_CAPSULE | Freq: Every day | ORAL | 1 refills | Status: DC

## 2019-11-02 NOTE — Patient Instructions (Signed)
Cariprazine oral capsules What is this medicine? CARIPRAZINE (car i PRA zeen) is an antipsychotic. It is used to treat schizophrenia or bipolar disorder. Bipolar disorder is also known as manic-depression. This medicine may be used for other purposes; ask your health care provider or pharmacist if you have questions. COMMON BRAND NAME(S): VRAYLAR What should I tell my health care provider before I take this medicine? They need to know if you have any of these conditions:  dementia  diabetes  difficulty swallowing  heart disease  history of breast cancer  kidney disease  liver disease  low blood counts, like low white cell, platelet, or red cell counts  low blood pressure  Parkinson's disease  seizures  suicidal thoughts, plans, or attempt; a previous suicide attempt by you or a family member  an unusual or allergic reaction to cariprazine, other medicines, foods, dyes, or preservatives  pregnant or trying to get pregnant  breast-feeding How should I use this medicine? Take this medicine by mouth with a glass of water. Follow the directions on the prescription label. You may take it with or without food. Take your medicine at regular intervals. Do not take it more often than directed. Do not stop taking except on your doctor's advice. Talk to your pediatrician regarding the use of this medicine in children. Special care may be needed. Overdosage: If you think you have taken too much of this medicine contact a poison control center or emergency room at once. NOTE: This medicine is only for you. Do not share this medicine with others. What if I miss a dose? If you miss a dose, take it as soon as you can. If it is almost time for your next dose, take only that dose. Do not take double or extra doses. What may interact with this medicine? Do not take this medicine with any of the following medications:  metoclopramide This medicine may also interact with the following  medications:  carbamazepine  certain medicines for depression, anxiety, or psychotic disturbances  certain medicines for sleep  itraconazole  ketoconazole  medicines for blood pressure  rifampin  St John's wort This list may not describe all possible interactions. Give your health care provider a list of all the medicines, herbs, non-prescription drugs, or dietary supplements you use. Also tell them if you smoke, drink alcohol, or use illegal drugs. Some items may interact with your medicine. What should I watch for while using this medicine? Visit your health care professional for regular checks on your progress. Tell your health care professional if symptoms do not start to get better or if they get worse. Do not stop taking except on your health care professional's advice. You may develop a severe reaction. Your health care professional will tell you how much medicine to take. Patients and their families should watch out for new or worsening depression or thoughts of suicide. Also watch out for sudden changes in feelings such as feeling anxious, agitated, panicky, irritable, hostile, aggressive, impulsive, severely restless, overly excited and hyperactive, or not being able to sleep. If this happens, especially at the beginning of treatment or after a change in dose, call your healthcare professional. Dennis Bast may get dizzy or drowsy. Do not drive, use machinery, or do anything that needs mental alertness until you know how this medicine affects you. Do not stand or sit up quickly, especially if you are an older patient. This reduces the risk of dizzy or fainting spells. Alcohol may interfere with the effect of this  medicine. Avoid alcoholic drinks. This medicine may cause dry eyes and blurred vision. If you wear contact lenses you may feel some discomfort. Lubricating drops may help. See your eye doctor if the problem does not go away or is severe. This medicine may increase blood sugar. Ask  your health care provider if changes in diet or medicines are needed if you have diabetes. This drug can cause problems with controlling your body temperature. It can lower the response of your body to cold temperatures. If possible, stay indoors during cold weather. If you must go outdoors, wear warm clothes. It can also lower the response of your body to heat. Do not overheat. Do not over-exercise. Stay out of the sun when possible. If you must be in the sun, wear cool clothing. Drink plenty of water. If you have trouble controlling your body temperature, call your health care provider right away. Women should inform their doctor if they wish to become pregnant or think they might be pregnant. The effects of this medicine on an unborn child are not known. A registry is available to monitor pregnancy outcomes in pregnant women exposed to this medicine or similar medicines. Talk to your health care professional or pharmacist for more information. What side effects may I notice from receiving this medicine? Side effects that you should report to your doctor or health care professional as soon as possible:  allergic reactions like skin rash, itching or hives, swelling of the face, lips, or tongue  breathing problems  confusion  fever or chills, sore throat  inability to keep still  problems with balance, talking, walking  redness, blistering, peeling or loosening of the skin, including inside the mouth  seizures  signs and symptoms of high blood sugar such as being more thirsty or hungry or having to urinate more than normal. You may also feel very tired or have blurry vision  signs and symptoms of low blood pressure like dizziness; feeling faint or lightheaded, falls; unusually weak or tired  signs and symptoms of neuroleptic malignant syndrome such as confusion; fast or irregular heartbeat; high fever; increased sweating; stiff muscles  sudden numbness or weakness of the face, arm, or  leg  suicidal thoughts or other mood changes  trouble swallowing  uncontrollable movements of the arms, face, head, mouth, neck, or upper body Side effects that usually do not require medical attention (report to your doctor or health care professional if they continue or are bothersome):  constipation  drowsiness  nausea, vomiting  upset stomach  weight gain This list may not describe all possible side effects. Call your doctor for medical advice about side effects. You may report side effects to FDA at 1-800-FDA-1088. Where should I keep my medicine? Keep out of the reach of children. Store at room temperature between 15 and 30 degrees C (59 and 86 degrees F). Protect from light. Throw away any unused medicine after the expiration date. NOTE: This sheet is a summary. It may not cover all possible information. If you have questions about this medicine, talk to your doctor, pharmacist, or health care provider.  2020 Elsevier/Gold Standard (2018-12-08 16:21:01)

## 2019-11-02 NOTE — Progress Notes (Signed)
Provider Location : [facility] Patient Location : Home  Participants: Patient , Provider  Virtual Visit via Video Note  I connected with Stacy [name] on 11/02/19 at 10:15 AM EDT by a video enabled telemedicine application and verified that I am speaking with the correct person using two identifiers.   I discussed the limitations of evaluation and management by telemedicine and the availability of in person appointments. The patient expressed understanding and agreed to proceed.     I discussed the assessment and treatment plan with the patient. The patient was provided an opportunity to ask questions and all were answered. The patient agreed with the plan and demonstrated an understanding of the instructions.   The patient was advised to call back or seek an in-person evaluation if the symptoms worsen or if the condition fails to improve as anticipated.   Pringle MD OP Progress Note  11/02/2019 10:34 AM Koryn Charlot  MRN:  [id number]  Chief Complaint:  Chief Complaint    Follow-up     HPI: Stacy [name] is a 59 year old Caucasian female, unemployed, married, lives in [place], has a history of bipolar disorder, Sjogren's syndrome, cognitive disorder, hypertension, hypothyroidism, arthritis, migraine headache was evaluated by telemedicine today.  Patient today reports she continues to struggle with mood swings.  She reports she is hypomanic at this time.  She has anxiety, agitation, high energy and also low mood on and off.  This is going back and forth since the past few days.  She continues to hear voices.  She reports it is as though she is talking to a friend.  She is having conversations.  She is often observed as smiling to herself by her family members.  She however reports these hallucinations as pleasant and positive.  She is not distressed by it.  She reports the olanzapine helped her to sleep and also helped to some extent with her mood however she felt extremely down and  groggy throughout the day and could not function much while on it.  She hence stopped taking it.  Patient continues to be compliant on the BuSpar.  Denies side effects.  Patient reports she was recently diagnosed with possible Parkinson's disease, neurocognitive disorder-rule out Lewy body dementia and has upcoming appointment with neurology.  Patient denies any suicidality or homicidality.  Patient denies any other concerns today.  Visit Diagnosis:    ICD-10-CM   1. Bipolar 2 disorder (HCC)  F31.81 cariprazine (VRAYLAR) capsule   hypomanic, mild, mixed  2. GAD (generalized anxiety disorder)  F41.1   3. Insomnia due to mental disorder  F51.05   4. 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  Past trials of Paxil, Wellbutrin, Latuda, Lamictal, Seroquel, Depakote, Paxil, Belsomra, Zyprexa  Past Medical History:  Past Medical History:  Diagnosis Date  . Aneurysm ([place])   . Anxiety   . Arthritis   . Brain stem lesion   . Chronic kidney disease    kideny stone  . Depression   . Fibromyalgia   . GERD (gastroesophageal reflux disease)   . Headache   . Hypotension   . Hypothyroid   . Memory loss   . Migraine   . Raynaud disease   . Sjogren's syndrome ([place])   . Thyroid disease     Past Surgical History:  Procedure Laterality Date  . ABDOMINAL HYSTERECTOMY    .  BASAL CELL CARCINOMA EXCISION    . BREAST BIOPSY    . CESAREAN SECTION    . EYE SURGERY    . KNEE SURGERY Left   . LITHOTRIPSY    . MUSCLE BIOPSY    . OOPHORECTOMY      Family Psychiatric History: I have reviewed family psychiatric history from my progress note on 12/10/2017  Family History:  Family History  Problem Relation Age of Onset  . Migraines Mother   . Anxiety disorder Mother   . Depression Mother   . Cancer Father   . Hypertension  Father   . Stroke Father   . Heart attack Father   . Alcohol abuse Father   . Diabetes Brother   . Alcohol abuse Sister   . Drug abuse Sister   . Anxiety disorder Sister   . Depression Sister   . Drug abuse Brother   . Cancer Paternal Aunt   . Lupus Paternal Aunt   . Thyroid disease Paternal Aunt   . Diabetes Maternal Grandfather   . Anxiety disorder Maternal Grandmother   . Depression Maternal Grandmother     Social History: Reviewed social history from my progress note on 12/10/2017 Social History   Socioeconomic History  . Marital status: Married    Spouse name: [name]  . Number of children: 2  . Years of education: 79  . Highest education level: Associate degree: occupational, Hotel manager, or vocational program  Occupational History  . Occupation: Disabled  Tobacco Use  . Smoking status: Never Smoker  . Smokeless tobacco: Never Used  Vaping Use  . Vaping Use: Never used  Substance and Sexual Activity  . Alcohol use: No    Alcohol/week: 0.0 standard drinks  . Drug use: Never  . Sexual activity: Yes    Birth control/protection: None  Other Topics Concern  . Not on file  Social History Narrative   Lives at home with husband. [name]).    Disabled.   Education college.   Right handed.   2 cups caffeine/daily   Social Determinants of Health   Financial Resource Strain:   . Difficulty of Paying Living Expenses: Not on file  Food Insecurity:   . Worried About Charity fundraiser in the Last Year: Not on file  . Ran Out of Food in the Last Year: Not on file  Transportation Needs:   . Lack of Transportation (Medical): Not on file  . Lack of Transportation (Non-Medical): Not on file  Physical Activity:   . Days of Exercise per Week: Not on file  . Minutes of Exercise per Session: Not on file  Stress:   . Feeling of Stress : Not on file  Social Connections:   . Frequency of Communication with Friends and Family: Not on file  . Frequency of Social Gatherings  with Friends and Family: Not on file  . Attends Religious Services: Not on file  . Active Member of Clubs or Organizations: Not on file  . Attends Archivist Meetings: Not on file  . Marital Status: Not on file    Allergies:  Allergies  Allergen Reactions  . Penicillins Hives  . Amoxicillin Hives  . Depakote [Divalproex Sodium]     vomiting  . Lamictal [Lamotrigine]     Side effects of abdominal cramps, nausea , diarrhea ,nightmares    Metabolic Disorder Labs: Lab Results  Component Value Date   HGBA1C 5.0 11/13/2015   No results found for: PROLACTIN No results found for: CHOL,  TRIG, HDL, CHOLHDL, VLDL, LDLCALC Lab Results  Component Value Date   TSH 1.780 10/26/2015   TSH 0.525 03/15/2015    Therapeutic Level Labs: No results found for: LITHIUM Lab Results  Component Value Date   VALPROATE 10 (L) 07/28/2019   No components found for:  CBMZ  Current Medications: Current Outpatient Medications  Medication Sig Dispense Refill  . busPIRone (BUSPAR) 10 MG tablet Take 1 tablet (10 mg total) by mouth 2 (two) times daily. 60 tablet 1  . cariprazine (VRAYLAR) capsule Take 1 capsule (1.5 mg total) by mouth daily. 30 capsule 1  . Cholecalciferol (VITAMIN D-3) 1000 units CAPS Take by mouth daily.    Marland Kitchen donepezil (ARICEPT) 10 MG tablet Take 10 mg by mouth every morning.    . gabapentin (NEURONTIN) 300 MG capsule Take 1 capsule by mouth two times daily 180 capsule 2  . hydroxychloroquine (PLAQUENIL) 200 MG tablet     . hydrOXYzine (VISTARIL) 25 MG capsule Take 1 capsule (25 mg total) by mouth daily as needed (for severe anxiety). Anxiety/agitation- please limit use 90 capsule 0  . levothyroxine (SYNTHROID, LEVOTHROID) 175 MCG tablet Take 1 tablet (175 mcg total) by mouth daily before breakfast. 90 tablet 3  . loperamide (IMODIUM A-D) 2 MG tablet Take 1 tablet (2 mg total) by mouth 4 (four) times daily as needed for diarrhea or loose stools. 30 tablet 0  . omeprazole  (PRILOSEC) 20 MG capsule Take 1 capsule (20 mg total) by mouth daily. 180 capsule 3  . SUMAtriptan (IMITREX) 100 MG tablet      No current facility-administered medications for this visit.     Musculoskeletal: Strength & Muscle Tone: UTA Gait & Station: normal Patient leans: N/A  Psychiatric Specialty Exam: Review of Systems  Psychiatric/Behavioral: Positive for dysphoric mood, hallucinations and sleep disturbance. The patient is nervous/anxious.   All other systems reviewed and are negative.   There were no vitals taken for this visit.There is no height or weight on file to calculate BMI.  General Appearance: Casual  Eye Contact:  Fair  Speech:  Clear and Coherent  Volume:  Normal  Mood:  Anxious and Dysphoric  Affect:  Congruent  Thought Process:  Goal Directed and Descriptions of Associations: Intact  Orientation:  Full (Time, Place, and Person)  Thought Content: Hallucinations: Auditory - does not distress her, positive, pleasant  Suicidal Thoughts:  No  Homicidal Thoughts:  No  Memory:  Immediate;   Fair Recent;   Fair Remote;   Fair  Judgement:  Fair  Insight:  Fair  Psychomotor Activity:  Normal  Concentration:  Concentration: Fair and Attention Span: Fair  Recall:  [employer] of Knowledge: Fair  Language: Fair  Akathisia:  No  Handed:  Right  AIMS (if indicated): UTA  Assets:  Communication Skills Desire for Improvement Housing Intimacy Social Support Talents/Skills  ADL's:  Intact  Cognition: WNL  Sleep:  Fair   Screenings: AIMS     Office Visit from 06/15/2018 in [facility] Office Visit from 05/20/2018 in [facility] Total Score 5 9    Mini-Mental     Office Visit from 01/01/2016 in [facility] Office Visit from 11/13/2015 in [facility] Office Visit from 10/26/2015 in [facility] Office Visit from 04/25/2015 in [facility] Office Visit from 10/25/2014 in [facility]  Total Score (max 30 points ) 25 26 21 23 29     PHQ2-9  Office Visit from 02/08/2015 in [facility] Office Visit from 10/07/2014 in [facility]  PHQ-2 Total Score 0 4  PHQ-9 Total Score -- 8       Assessment and Plan: Mabelle Mungin is a 59 year old Caucasian female who has a history of bipolar disorder type II, neuroleptic induced parkinsonian syndrome, hypothyroidism, cognitive disorder, insomnia, fibromyalgia, Sjogren's syndrome was evaluated by telemedicine today.  Patient did psychosocial stressors of her grandsons cancer diagnosis, her own physical problems and recent relocation continue to need medication management for mood swings, psychosis.  Patient also with recent diagnosis of neurocognitive disorder is currently under the care of neurology.  Plan as noted below.  Plan Bipolar disorder type II-unstable Discontinue Zyprexa Start Vraylar 1.5 mg p.o. daily Continue BuSpar 10 mg p.o. twice daily  GAD-unstable BuSpar 10 mg p.o. twice daily Continue psychotherapy sessions with Ms. [name]  Insomnia-improving We will monitor closely  Neurocognitive disorder with behavioral changes-unstable It is likely her psychotic symptoms are due to her neurocognitive issues and not due to her mood symptoms.  However will monitor closely.  She is currently under the care of neurology who is currently ruling out Lewy body dementia.  We will coordinate care.  Have reviewed GeneSight testing results while making medication changes today.  Follow-up in clinic in 4 weeks or sooner if needed.  I have spent atleast 20 minutes face to face with patient today. More than 50 % of the time was spent for preparing to see the patient ( e.g., review of test, records ), obtaining and to review and separately obtained history , ordering medications and test ,psychoeducation and  supportive psychotherapy and care coordination,as well as documenting clinical information in electronic health record,interpreting results of test and communication of results This note was generated in part or whole with voice recognition software. Voice recognition is usually quite accurate but there are transcription errors that can and very often do occur. I apologize for any typographical errors that were not detected and corrected.        Ursula Alert, MD 11/02/2019, 10:34 AM

## 2019-11-04 ENCOUNTER — Other Ambulatory Visit: Payer: Self-pay

## 2019-11-04 ENCOUNTER — Ambulatory Visit: Payer: Self-pay | Admitting: Licensed Clinical Social Worker

## 2019-11-04 ENCOUNTER — Telehealth: Payer: Self-pay

## 2019-11-04 DIAGNOSIS — Z5329 Procedure and treatment not carried out because of patient's decision for other reasons: Secondary | ICD-10-CM

## 2019-11-04 NOTE — Telephone Encounter (Signed)
went online and submitted the prior auth - pending ?

## 2019-11-04 NOTE — Telephone Encounter (Signed)
vralar 1.5mg  was approved until 02-25-20

## 2019-11-04 NOTE — Progress Notes (Signed)
NS

## 2019-11-04 NOTE — Telephone Encounter (Signed)
received a fax that a prior auth was needed on the vraylar

## 2019-11-11 ENCOUNTER — Telehealth: Payer: Self-pay

## 2019-11-11 ENCOUNTER — Other Ambulatory Visit: Payer: Self-pay

## 2019-11-11 ENCOUNTER — Ambulatory Visit (INDEPENDENT_AMBULATORY_CARE_PROVIDER_SITE_OTHER): Payer: Medicare HMO | Admitting: Licensed Clinical Social Worker

## 2019-11-11 DIAGNOSIS — F411 Generalized anxiety disorder: Secondary | ICD-10-CM | POA: Diagnosis not present

## 2019-11-11 DIAGNOSIS — F02A18 Dementia in other diseases classified elsewhere, mild, with other behavioral disturbance: Secondary | ICD-10-CM

## 2019-11-11 DIAGNOSIS — F3181 Bipolar II disorder: Secondary | ICD-10-CM

## 2019-11-11 DIAGNOSIS — F0281 Dementia in other diseases classified elsewhere with behavioral disturbance: Secondary | ICD-10-CM

## 2019-11-11 MED ORDER — ARIPIPRAZOLE 2 MG PO TABS: 2.0000 mg | ORAL_TABLET | Freq: Every day | ORAL | 0 refills | Status: DC

## 2019-11-11 MED ORDER — CLONAZEPAM 0.5 MG PO TABS: 0.5000 mg | ORAL_TABLET | Freq: Every day | ORAL | 0 refills | Status: DC | PRN

## 2019-11-11 NOTE — Telephone Encounter (Signed)
Patient called regarding her Vraylar 1.5mg . She stated that when she went to pick up the medication, the pharmacy told her it would be a $350-$400 copay and she can't afford that. She requested to try something else. Please review and advise. Thank you.

## 2019-11-11 NOTE — Progress Notes (Signed)
Virtual Visit via Video Note  I connected with [name] on 11/11/19 at 10:00 AM EDT by a video enabled telemedicine application and verified that I am speaking with the correct person using two identifiers.  Location: Patient: home Provider: [facility]   I discussed the limitations of evaluation and management by telemedicine and the availability of in person appointments. The patient expressed understanding and agreed to proceed.   The patient was advised to call back or seek an in-person evaluation if the symptoms worsen or if the condition fails to improve as anticipated.  I provided 30 minutes of non-face-to-face time during this encounter.   Zenovia Justman R Deondray Ospina, LCSW    THERAPIST PROGRESS NOTE  Session Time: 10:00-10:30a  Participation Level: Active  Behavioral Response: NeatAlertDepressed  Type of Therapy: Individual Therapy  Treatment Goals addressed: Coping  Interventions: Supportive  Summary: Stacy [name] is a 59 y.o. female who presents with worsening symptoms of depression: depressed mood, anhedonia, low energy, crying spells for most of the day/every day, low appetite, and just "feeling really bad". Pt was given prescription for vraylar that she was unable to pick up a the pharmacy because she could not afford the copayment. Pt reports that the depressive episode was triggered by lack of medication. Pt is trying hard to use coping skills--pt reports that she made herself go out on a walk yesterday hoping it would make her feel better.   Pt denies SI and feels that family members and friends are being very supportive during this tough time.   Encouraged pt to be kind to herself and to practice self-care.   Suicidal/Homicidal: No  SI, HI, or AVH reported at time of session.  Therapist Response: Kera's current situation seems to be overwhelming patients current resources. Pt does not have medication due to cost and is experiencing a relapse of depression  symptoms, which is indicative of fluctuating progress.   Plan: Return again in 1.5 weeks. Will communicate concerns to psychiatrist for medication adjustment/management of symptoms.   Diagnosis: Axis I: Bipolar, Depressed    Axis II: No diagnosis    Rachel Bo Lilya Smitherman, LCSW 11/11/2019

## 2019-11-11 NOTE — Telephone Encounter (Signed)
Returned call to patient.  She reports she could not afford the Vraylar. She is feeling very depressed.  We will start Abilify 2 mg p.o. daily.  She wonders whether she can take a short supply of Klonopin as needed.  We will send Klonopin to pharmacy.  Provided medication education.

## 2019-11-17 ENCOUNTER — Telehealth: Payer: Self-pay

## 2019-11-17 NOTE — Telephone Encounter (Signed)
received a fax requesting a prior authorization on pt clonazepam .5mg 

## 2019-11-17 NOTE — Telephone Encounter (Signed)
went online to [email or web address] and submitted the prior auth 

## 2019-11-17 NOTE — Telephone Encounter (Signed)
received fax that clonazepam was approved until 02-25-20

## 2019-11-26 ENCOUNTER — Ambulatory Visit (INDEPENDENT_AMBULATORY_CARE_PROVIDER_SITE_OTHER): Payer: Medicare HMO | Admitting: Licensed Clinical Social Worker

## 2019-11-26 ENCOUNTER — Other Ambulatory Visit: Payer: Self-pay

## 2019-11-26 DIAGNOSIS — F3181 Bipolar II disorder: Secondary | ICD-10-CM

## 2019-11-26 DIAGNOSIS — F411 Generalized anxiety disorder: Secondary | ICD-10-CM

## 2019-11-26 NOTE — Progress Notes (Signed)
Virtual Visit via Video Note  I connected with Stacy [name] on 11/26/19 at 10:00 AM EDT by a video enabled telemedicine application and verified that I am speaking with the correct person using two identifiers.  Location: Patient: home Provider: remote office [place], [place])   I discussed the limitations of evaluation and management by telemedicine and the availability of in person appointments. The patient expressed understanding and agreed to proceed.  The patient was advised to call back or seek an in-person evaluation if the symptoms worsen or if the condition fails to improve as anticipated.  I provided 25 minutes of non-face-to-face time during this encounter.   Munachimso Palin R Zacchary Pompei, LCSW    THERAPIST PROGRESS NOTE  Session Time: 10:00-10:25a  Participation Level: Active  Behavioral Response: NeatAlertAnxious and Depressed  Type of Therapy: Individual Therapy  Treatment Goals addressed: Coping  Interventions: CBT, Strength-based and Supportive  Summary: Stacy [name] is a 59 y.o. female who presents with continuing symptoms related to pt diagnosis of bipolar disorder and generalized anxiety disorder. Pt continuing to feel depressed mood, lack of appetite, nausea, sadness, and hyperemotionality on a daily basis. Pt reports that she is frequently waking at night and doesn't feel well rested the next day. Pt reports that she doesn't feel the medication is working to manage her mood and is making her nauseous, but she is continuing to take it.    Reviewed importance of cognitive stimulation, physical activity, social interaction, and self care as CBT-based interventions to help treat depression symptoms. Pt reports that she is being intentional about incorporating some of these activities into her daily routine. Pt trying to keep day structured and keeping positive activities to look forward to.    Suicidal/Homicidal: No  SI, HI, or AVH reported at time of  session.  Therapist Response: Kimberlys recent symptom relapse (increase in depression symptoms) seems to be better contained at this session, but pt continues to report intense depression symptoms, which indicates fluctuating/intermittent progress.   Plan: Return again in 3 weeks. The ongoing treatment plan includes maintaining current levels of progress and continuing to build skills to manage mood, improve stress/anxiety management, emotion regulation, distress tolerance, and behavior modification.   Diagnosis: Axis I: Adjustment Disorder with Mixed Emotional Features, Bipolar, Depressed and Generalized Anxiety Disorder    Axis II: No diagnosis    [place], LCSW 11/26/2019

## 2019-12-08 ENCOUNTER — Other Ambulatory Visit: Payer: Self-pay | Admitting: Psychiatry

## 2019-12-08 DIAGNOSIS — F3181 Bipolar II disorder: Secondary | ICD-10-CM

## 2019-12-10 ENCOUNTER — Encounter: Payer: Self-pay | Admitting: Psychiatry

## 2019-12-10 ENCOUNTER — Telehealth (INDEPENDENT_AMBULATORY_CARE_PROVIDER_SITE_OTHER): Payer: Medicare HMO | Admitting: Psychiatry

## 2019-12-10 ENCOUNTER — Other Ambulatory Visit: Payer: Self-pay

## 2019-12-10 DIAGNOSIS — F0281 Dementia in other diseases classified elsewhere with behavioral disturbance: Secondary | ICD-10-CM | POA: Diagnosis not present

## 2019-12-10 DIAGNOSIS — F02A18 Dementia in other diseases classified elsewhere, mild, with other behavioral disturbance: Secondary | ICD-10-CM

## 2019-12-10 DIAGNOSIS — F3181 Bipolar II disorder: Secondary | ICD-10-CM

## 2019-12-10 DIAGNOSIS — F411 Generalized anxiety disorder: Secondary | ICD-10-CM

## 2019-12-10 DIAGNOSIS — F5105 Insomnia due to other mental disorder: Secondary | ICD-10-CM | POA: Diagnosis not present

## 2019-12-10 MED ORDER — FLUPHENAZINE HCL 5 MG PO TABS: 5.0000 mg | ORAL_TABLET | Freq: Every day | ORAL | 1 refills | Status: DC

## 2019-12-10 NOTE — Patient Instructions (Signed)
Fluphenazine tablets What is this medicine? FLUPHENAZINE (floo FEN a zeen) helps to treat disordered thoughts and some other emotional, nervous, and mental problems. This medicine may be used for other purposes; ask your health care provider or pharmacist if you have questions. COMMON BRAND NAME(S): Prolixin What should I tell my health care provider before I take this medicine? They need to know if you have any of these conditions:  blockage in your bowel  brain tumor  dementia  diabetes  difficulty swallowing  glaucoma  have trouble controlling your muscles  head injury  heart disease  history of irregular heartbeat  if you often drink alcohol  liver disease  low blood counts, like low white cell, platelet, or red cell counts  low blood pressure  lung or breathing disease, like asthma  Parkinson's disease  prostate disease  seizures  trouble passing urine  an unusual or allergic reaction to fluphenazine, other medicines, foods, dyes, or preservatives  pregnant or trying to get pregnant  breast-feeding How should I use this medicine? Take this medicine by mouth with a glass of water. Follow the directions on the prescription label. Take your doses at regular intervals. Do not take your medicine more often than directed. Do not stop taking this medicine except on the advice of your doctor or health care professional. Talk to your pediatrician regarding the use of this medicine in children. Special care may be needed. Overdosage: If you think you have taken too much of this medicine contact a poison control center or emergency room at once. NOTE: This medicine is only for you. Do not share this medicine with others. What if I miss a dose? If you miss a dose, take it as soon as you can. If it is almost time for your next dose, take only that dose. Do not take double or extra doses. What may interact with this medicine? Do not take this medicine with any of the  following medications:  cisapride  dofetilide  dronedarone  metoclopramide  pimozide  saquinavir  thioridazine This medicine may also interact with the following medications:  alcohol  antihistamines for allergy, cough, and cold  atropine  certain medicines for anxiety or sleep  certain medicines for bladder problems like oxybutynin, tolterodine  certain medicines for depression like amitriptyline, fluoxetine, sertraline  certain medicines for Parkinson's disease like benztropine, trihexyphenidyl  certain medicines for stomach problems like dicyclomine, hyoscyamine  certain medicines for travel sickness like scopolamine  epinephrine  general anesthetics like halothane, isoflurane, methoxyflurane, propofol  ipratropium  lithium  medicines for high blood pressure  medicines for seizures like phenobarbital, primidone, phenytoin  medicines that relax muscles for surgery  narcotic medicines for pain This list may not describe all possible interactions. Give your health care provider a list of all the medicines, herbs, non-prescription drugs, or dietary supplements you use. Also tell them if you smoke, drink alcohol, or use illegal drugs. Some items may interact with your medicine. What should I watch for while using this medicine? Visit your health care professional for regular checks on your progress. Tell your health care professional if symptoms do not start to get better or if they get worse. Do not stop taking except on your health care professional's advice. You may develop a severe reaction. Your health care professional will tell you how much medicine to take. You may get drowsy, dizzy, or have blurred vision. Do not drive, use machinery, or do anything that needs mental alertness until you know how  this medicine affects you. Do not stand or sit up quickly, especially if you are an older patient. This reduces the risk of dizzy or fainting spells. Alcohol can  increase possible dizziness or drowsiness. Avoid alcoholic drinks. This drug can cause problems with controlling your body temperature. It can lower the response of your body to cold temperatures. If possible, stay indoors during cold weather. If you must go outdoors, wear warm clothes. It can also lower the response of your body to heat. Do not overheat. Do not over-exercise. Stay out of the sun when possible. If you must be in the sun, wear cool clothing. Drink plenty of water. If you have trouble controlling your body temperature, call your health care provider right away. This medicine can make you more sensitive to the sun. Keep out of the sun. If you cannot avoid being in the sun, wear protective clothing and use sunscreen. Do not use sun lamps or tanning beds/booths. Your mouth may get dry. Chewing sugarless gum or sucking hard candy, and drinking plenty of water may help. Contact your doctor if the problem does not go away or is severe. This medicine may increase blood sugar. Ask your health care provider if changes in diet or medicines are needed if you have diabetes. If you are going to have surgery, tell your doctor or health care professional that you are taking this medicine. What side effects may I notice from receiving this medicine?  allergic reactions like skin rash, itching or hives, swelling of the face, lips, or tongue  abnormal production of milk  breast enlargement in both males and females  changes in vision  chest pain  confusion  fast, irregular heartbeat  fever, chills, sore throat  seizures  signs and symptoms of high blood sugar such as being more thirsty or hungry or having to urinate more than normal. You may also feel very tired or have blurry vision.  signs and symptoms of liver injury like dark yellow or brown urine; general ill feeling or flu-like symptoms; light-colored stools; loss of appetite; nausea; right upper belly pain; unusually weak or tired;  yellowing of the eyes or skin  signs and symptoms of low blood pressure like dizziness; feeling faint or lightheaded, falls; unusually weak or tired  trouble passing urine or change in the amount of urine  trouble swallowing  uncontrollable movements of the arms, face, head, mouth, neck, or upper body  unusual bruising or bleeding  unusually weak or tired Side effects that usually do not require medical attention (report to your doctor or health care professional if they continue or are bothersome):  constipation  drowsiness  dry mouth This list may not describe all possible side effects. Call your doctor for medical advice about side effects. You may report side effects to FDA at 1-800-FDA-1088. Where should I keep my medicine? Keep out of the reach of children. Store at room temperature between 15 and 30 degrees C (59 and 86 degrees F). Protect from light. Throw away any unused medicine after the expiration date. NOTE: This sheet is a summary. It may not cover all possible information. If you have questions about this medicine, talk to your doctor, pharmacist, or health care provider.  2020 Elsevier/Gold Standard (2018-12-16 11:37:54)

## 2019-12-10 NOTE — Progress Notes (Signed)
Provider Location : [facility] Patient Location : Home  Participants: Patient , Provider  Virtual Visit via Video Note  I connected with Stacy [name] on 12/10/19 at  9:40 AM EDT by a video enabled telemedicine application and verified that I am speaking with the correct person using two identifiers.   I discussed the limitations of evaluation and management by telemedicine and the availability of in person appointments. The patient expressed understanding and agreed to proceed.      I discussed the assessment and treatment plan with the patient. The patient was provided an opportunity to ask questions and all were answered. The patient agreed with the plan and demonstrated an understanding of the instructions.   The patient was advised to call back or seek an in-person evaluation if the symptoms worsen or if the condition fails to improve as anticipated.   [facility] MD OP Progress Note  12/10/2019 10:59 AM Stacy [name]  MRN:  [id number]  Chief Complaint:  Chief Complaint    Follow-up     HPI: Stacy [name] is a 59 year old Caucasian female, unemployed, married, lives in [place], has a history of bipolar disorder, GAD, insomnia, Sjogren's syndrome, cognitive disorder, hypertension, hypothyroidism, arthritis, migraine headaches was evaluated by telemedicine today.  Patient today reports she is currently very nauseous on the current medication that she takes.  She recently started Abilify.  She reports since starting the Abilify she has nausea every day.  She skipped the Abilify couple of times and she did not have nausea  those days.  She reports the nausea does affect her a lot and is distressing.  She continues to struggle with hallucinations, auditory.  She reports she is currently coping with these auditory hallucinations and it does not bother her much.  She denies any visual hallucinations at this time.  She reports she continues to struggle with mood swings.  She struggles with  up and down in her mood and this is affecting her a lot.  Patient became tearful in session today.  She reports sleep is okay.  She continues to be compliant on her medications like BuSpar.  Patient denies any suicidality, homicidality.  She continues to struggle with memory problems.  She however denies any parkinsonian symptoms at this time.  She does have upcoming appointment with neurology.  Patient denies any other concerns today.  Visit Diagnosis:    ICD-10-CM   1. Bipolar 2 disorder (HCC)  F31.81 fluPHENAZine (PROLIXIN) 5 MG tablet   MIXED, MILD  2. GAD (generalized anxiety disorder)  F41.1   3. Insomnia due to mental condition  F51.05   4. 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  Past trials of Paxil, Wellbutrin, Latuda, Lamictal, Seroquel, Depakote, Paxil, Belsomra, Zyprexa, Abilify, vraylar  Past Medical History:  Past Medical History:  Diagnosis Date  . Aneurysm ([place])   . Anxiety   . Arthritis   . Brain stem lesion   . Chronic kidney disease    kideny stone  . Depression   . Fibromyalgia   . GERD (gastroesophageal reflux disease)   . Headache   . Hypotension   . Hypothyroid   . Memory loss   . Migraine   . Raynaud disease   . Sjogren's syndrome ([place])   . Thyroid disease     Past Surgical History:  Procedure Laterality  Date  . ABDOMINAL HYSTERECTOMY    . BASAL CELL CARCINOMA EXCISION    . BREAST BIOPSY    . CESAREAN SECTION    . EYE SURGERY    . KNEE SURGERY Left   . LITHOTRIPSY    . MUSCLE BIOPSY    . OOPHORECTOMY      Family Psychiatric History: I have reviewed family psychiatric history from my progress note on 12/10/2017  Family History:  Family History  Problem Relation Age of Onset  . Migraines Mother   . Anxiety disorder Mother   .  Depression Mother   . Cancer Father   . Hypertension Father   . Stroke Father   . Heart attack Father   . Alcohol abuse Father   . Diabetes Brother   . Alcohol abuse Sister   . Drug abuse Sister   . Anxiety disorder Sister   . Depression Sister   . Drug abuse Brother   . Cancer Paternal Aunt   . Lupus Paternal Aunt   . Thyroid disease Paternal Aunt   . Diabetes Maternal Grandfather   . Anxiety disorder Maternal Grandmother   . Depression Maternal Grandmother     Social History: Reviewed social history from my progress note on 12/10/2017 Social History   Socioeconomic History  . Marital status: Married    Spouse name: [name]  . Number of children: 2  . Years of education: 68  . Highest education level: Associate degree: occupational, Hotel manager, or vocational program  Occupational History  . Occupation: Disabled  Tobacco Use  . Smoking status: Never Smoker  . Smokeless tobacco: Never Used  Vaping Use  . Vaping Use: Never used  Substance and Sexual Activity  . Alcohol use: No    Alcohol/week: 0.0 standard drinks  . Drug use: Never  . Sexual activity: Yes    Birth control/protection: None  Other Topics Concern  . Not on file  Social History Narrative   Lives at home with husband. [name]).    Disabled.   Education college.   Right handed.   2 cups caffeine/daily   Social Determinants of Health   Financial Resource Strain:   . Difficulty of Paying Living Expenses: Not on file  Food Insecurity:   . Worried About Charity fundraiser in the Last Year: Not on file  . Ran Out of Food in the Last Year: Not on file  Transportation Needs:   . Lack of Transportation (Medical): Not on file  . Lack of Transportation (Non-Medical): Not on file  Physical Activity:   . Days of Exercise per Week: Not on file  . Minutes of Exercise per Session: Not on file  Stress:   . Feeling of Stress : Not on file  Social Connections:   . Frequency of Communication with Friends and  Family: Not on file  . Frequency of Social Gatherings with Friends and Family: Not on file  . Attends Religious Services: Not on file  . Active Member of Clubs or Organizations: Not on file  . Attends Archivist Meetings: Not on file  . Marital Status: Not on file    Allergies:  Allergies  Allergen Reactions  . Penicillins Hives  . Amoxicillin Hives  . Depakote [Divalproex Sodium]     vomiting  . Lamictal [Lamotrigine]     Side effects of abdominal cramps, nausea , diarrhea ,nightmares    Metabolic Disorder Labs: Lab Results  Component Value Date   HGBA1C 5.0 11/13/2015   No  results found for: PROLACTIN No results found for: CHOL, TRIG, HDL, CHOLHDL, VLDL, LDLCALC Lab Results  Component Value Date   TSH 1.780 10/26/2015   TSH 0.525 03/15/2015    Therapeutic Level Labs: No results found for: LITHIUM Lab Results  Component Value Date   VALPROATE 10 (L) 07/28/2019   No components found for:  CBMZ  Current Medications: Current Outpatient Medications  Medication Sig Dispense Refill  . busPIRone (BUSPAR) 10 MG tablet Take 1 tablet (10 mg total) by mouth 2 (two) times daily. 60 tablet 1  . Cholecalciferol (VITAMIN D-3) 1000 units CAPS Take by mouth daily.    . clonazePAM (KLONOPIN) 0.5 MG tablet Take 1 tablet (0.5 mg total) by mouth daily as needed for anxiety. AS NEEDED FOR SEVERE ANXIETY ATTACKS 15 tablet 0  . donepezil (ARICEPT) 10 MG tablet Take 10 mg by mouth every morning.    . fluPHENAZine (PROLIXIN) 5 MG tablet Take 1 tablet (5 mg total) by mouth daily. 15 tablet 1  . gabapentin (NEURONTIN) 300 MG capsule Take 1 capsule by mouth two times daily 180 capsule 2  . hydroxychloroquine (PLAQUENIL) 200 MG tablet     . hydrOXYzine (VISTARIL) 25 MG capsule Take 1 capsule (25 mg total) by mouth daily as needed (for severe anxiety). Anxiety/agitation- please limit use 90 capsule 0  . levothyroxine (SYNTHROID, LEVOTHROID) 175 MCG tablet Take 1 tablet (175 mcg total)  by mouth daily before breakfast. 90 tablet 3  . loperamide (IMODIUM A-D) 2 MG tablet Take 1 tablet (2 mg total) by mouth 4 (four) times daily as needed for diarrhea or loose stools. 30 tablet 0  . omeprazole (PRILOSEC) 20 MG capsule Take 1 capsule (20 mg total) by mouth daily. 180 capsule 3  . SUMAtriptan (IMITREX) 100 MG tablet      No current facility-administered medications for this visit.     Musculoskeletal: Strength & Muscle Tone: UTA Gait & Station: Seated Patient leans: N/A  Psychiatric Specialty Exam: Review of Systems  Gastrointestinal: Positive for nausea.  Psychiatric/Behavioral: Positive for dysphoric mood and hallucinations. The patient is nervous/anxious.   All other systems reviewed and are negative.   There were no vitals taken for this visit.There is no height or weight on file to calculate BMI.  General Appearance: Casual  Eye Contact:  Fair  Speech:  Clear and Coherent  Volume:  Normal  Mood:  Anxious and Depressed  Affect:  Tearful  Thought Process:  Goal Directed and Descriptions of Associations: Intact  Orientation:  Full (Time, Place, and Person)  Thought Content: Hallucinations: Auditory   Suicidal Thoughts:  No  Homicidal Thoughts:  No  Memory:  Immediate;   Fair Recent;   Fair Remote;   limited  Judgement:  Fair  Insight:  Fair  Psychomotor Activity:  Normal  Concentration:  Concentration: Fair and Attention Span: Fair  Recall:  [employer] of Knowledge: Fair  Language: Fair  Akathisia:  No  Handed:  Right  AIMS (if indicated):UTA  Assets:  Communication Skills Desire for Improvement Housing Intimacy Social Support  ADL's:  Intact  Cognition: WNL  Sleep:  Fair   Screenings: AIMS     Office Visit from 06/15/2018 in [facility] Office Visit from 05/20/2018 in [facility] Total Score 5 9    GAD-7     Counselor from 11/11/2019 in [facility]   Total GAD-7 Score 18    Mini-Mental     Office  Visit from 01/01/2016 in [facility] Office Visit from 11/13/2015 in [facility] Office Visit from 10/26/2015 in [facility] Office Visit from 04/25/2015 in [facility] Office Visit from 10/25/2014 in [facility]  Total Score (max 30 points ) 25 26 21 23 29     PHQ2-9     Counselor from 11/11/2019 in [facility] Office Visit from 02/08/2015 in [facility] Office Visit from 10/07/2014 in [facility]  PHQ-2 Total Score 6 0 4  PHQ-9 Total Score 21 -- 8       Assessment and Plan: Mandi Mattioli is a 59 year old Caucasian female who has a history of bipolar disorder type II, neuroleptic induced parkinsonian syndrome, hypothyroidism, cognitive disorder, insomnia, Sjogren's syndrome, fibromyalgia was evaluated by telemedicine today.  Patient continues to have psychosocial stressors of her own health issues, the current pandemic.  Patient with recent diagnosis of neurocognitive disorder continues to be under the care of neurology.  Discussed plan as noted below.  Plan Bipolar disorder type II-unstable Discontinue Abilify for nausea. Start Prolixin 5 mg p.o. daily Continue BuSpar 10 mg p.o. twice daily  GAD-unstable BuSpar 10 mg p.o. twice daily Continue CBT with Ms.Hussami Continue Klonopin 0.5 mg as needed for severe panic symptoms.  She has been limiting use.  Insomnia-improving Will monitor closely.  Neurocognitive disorder with behavioral changes-unstable It is likely her psychotic symptoms are due to her neurocognitive issues.  She however does carry a diagnosis of bipolar type II since the past several years, even before her recent cognitive changes.  She will continue to follow-up with neurology.  I have reviewed [facility] testing results while making medication changes  today.  Follow-up in clinic in 3 to 4 weeks or sooner if needed.  I have spent atleast 20 minutes face to face by video with patient today. More than 50 % of the time was spent for preparing to see the patient ( e.g., review of test, records ), ordering medications and test ,psychoeducation and supportive psychotherapy and care coordination,as well as documenting clinical information in electronic health record. This note was generated in part or whole with voice recognition software. Voice recognition is usually quite accurate but there are transcription errors that can and very often do occur. I apologize for any typographical errors that were not detected and corrected.      Ursula Alert, MD 12/10/2019, 10:59 AM

## 2019-12-15 ENCOUNTER — Telehealth: Payer: Self-pay | Admitting: Psychiatry

## 2019-12-15 DIAGNOSIS — F3181 Bipolar II disorder: Secondary | ICD-10-CM

## 2019-12-15 DIAGNOSIS — F0281 Dementia in other diseases classified elsewhere with behavioral disturbance: Secondary | ICD-10-CM

## 2019-12-15 DIAGNOSIS — F02A18 Dementia in other diseases classified elsewhere, mild, with other behavioral disturbance: Secondary | ICD-10-CM

## 2019-12-15 MED ORDER — FLUPHENAZINE HCL 5 MG PO TABS: 5.0000 mg | ORAL_TABLET | Freq: Every day | ORAL | 1 refills | Status: DC

## 2019-12-15 NOTE — Telephone Encounter (Signed)
I have sent Prolixin to pharmacy again due to E prescribing error previously.

## 2019-12-17 ENCOUNTER — Ambulatory Visit: Payer: Medicare HMO | Admitting: Licensed Clinical Social Worker

## 2019-12-22 ENCOUNTER — Telehealth: Payer: Self-pay

## 2019-12-22 DIAGNOSIS — F5105 Insomnia due to other mental disorder: Secondary | ICD-10-CM

## 2019-12-22 MED ORDER — BUSPIRONE HCL 10 MG PO TABS: 10.0000 mg | ORAL_TABLET | Freq: Two times a day (BID) | ORAL | 1 refills | Status: DC

## 2019-12-22 NOTE — Telephone Encounter (Signed)
I have sent BuSpar to pharmacy. 

## 2019-12-22 NOTE — Telephone Encounter (Signed)
received fax requesting a 90 day supply of the buspirone 10mg 

## 2020-01-04 ENCOUNTER — Telehealth (INDEPENDENT_AMBULATORY_CARE_PROVIDER_SITE_OTHER): Payer: Medicare HMO | Admitting: Psychiatry

## 2020-01-04 ENCOUNTER — Other Ambulatory Visit: Payer: Self-pay

## 2020-01-04 ENCOUNTER — Encounter: Payer: Self-pay | Admitting: Psychiatry

## 2020-01-04 DIAGNOSIS — F3181 Bipolar II disorder: Secondary | ICD-10-CM

## 2020-01-04 DIAGNOSIS — F0281 Dementia in other diseases classified elsewhere with behavioral disturbance: Secondary | ICD-10-CM

## 2020-01-04 DIAGNOSIS — F02A18 Dementia in other diseases classified elsewhere, mild, with other behavioral disturbance: Secondary | ICD-10-CM

## 2020-01-04 DIAGNOSIS — F411 Generalized anxiety disorder: Secondary | ICD-10-CM

## 2020-01-04 MED ORDER — CLONAZEPAM 0.5 MG PO TABS: 0.5000 mg | ORAL_TABLET | Freq: Every day | ORAL | 0 refills | Status: DC | PRN

## 2020-01-04 MED ORDER — FLUPHENAZINE HCL 5 MG PO TABS: 5.0000 mg | ORAL_TABLET | Freq: Every day | ORAL | 2 refills | Status: DC

## 2020-01-04 NOTE — Progress Notes (Signed)
Virtual Visit via Video Note  I connected with Stacy [name] on 01/04/20 at 11:40 AM EST by a video enabled telemedicine application and verified that I am speaking with the correct person using two identifiers. Location Provider Location : [facility] Patient Location : Home  Participants: Patient , Provider   I discussed the limitations of evaluation and management by telemedicine and the availability of in person appointments. The patient expressed understanding and agreed to proceed.    I discussed the assessment and treatment plan with the patient. The patient was provided an opportunity to ask questions and all were answered. The patient agreed with the plan and demonstrated an understanding of the instructions.   The patient was advised to call back or seek an in-person evaluation if the symptoms worsen or if the condition fails to improve as anticipated.   [facility] MD OP Progress Note  01/04/2020 12:59 PM Azlyn Wingler  MRN:  [id number]  Chief Complaint:  Chief Complaint    Follow-up     HPI: Stacy [name] is a 59 year old Caucasian female, unemployed, married, lives in [place], has a history of bipolar disorder, GAD, major neurocognitive disorder, Sjogren's syndrome, hypertension, hypothyroidism, arthritis, migraine headaches was evaluated by telemedicine today.  Patient today reports she is currently making progress on the Prolixin.  She has not had any side effects to the Prolixin.  She reports her mood symptoms as improving on the medication.  She does feel restless and anxious often however she does not think it is due to the Prolixin, she had it even before.  She is on BuSpar.  It does help.  She does not want to increase the dosage at this time and wants to give it more time.  She reports sleep is good.  She reports her visual hallucinations have resolved and she currently reports auditory hallucinations as improving.  Her hallucinations does not bother her much since  being on the Prolixin.  Patient denies any suicidality, homicidality or perceptual disturbances.  She does report sleep as restless.  She does wake up in the middle of the night however is able to go back to sleep.  She gets around 6 hours of sleep at night.  She however has upcoming sleep study scheduled.  She is awaiting the same.  Patient denies any other concerns today.  Visit Diagnosis:    ICD-10-CM   1. Bipolar 2 disorder (HCC)  F31.81 fluPHENAZine (PROLIXIN) 5 MG tablet   MIXED, MILD  2. GAD (generalized anxiety disorder)  F41.1 clonazePAM (KLONOPIN) 0.5 MG tablet  3. 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  Past trials of Paxil, Wellbutrin, Latuda, Lamictal, Seroquel, Depakote, Belsomra, Zyprexa, Abilify, Vraylar  Past Medical History:  Past Medical History:  Diagnosis Date  . Aneurysm ([place])   . Anxiety   . Arthritis   . Brain stem lesion   . Chronic kidney disease    kideny stone  . Depression   . Fibromyalgia   . GERD (gastroesophageal reflux disease)   . Headache   . Hypotension   . Hypothyroid   . Memory loss   . Migraine   . Raynaud disease   . Sjogren's syndrome ([place])   . Thyroid disease     Past Surgical History:  Procedure Laterality Date  . ABDOMINAL HYSTERECTOMY    . BASAL CELL  CARCINOMA EXCISION    . BREAST BIOPSY    . CESAREAN SECTION    . EYE SURGERY    . KNEE SURGERY Left   . LITHOTRIPSY    . MUSCLE BIOPSY    . OOPHORECTOMY      Family Psychiatric History: I have reviewed family psychiatric history from my progress note on 12/10/2017  Family History:  Family History  Problem Relation Age of Onset  . Migraines Mother   . Anxiety disorder Mother   . Depression Mother   . Cancer Father   . Hypertension Father   . Stroke Father   .  Heart attack Father   . Alcohol abuse Father   . Diabetes Brother   . Alcohol abuse Sister   . Drug abuse Sister   . Anxiety disorder Sister   . Depression Sister   . Drug abuse Brother   . Cancer Paternal Aunt   . Lupus Paternal Aunt   . Thyroid disease Paternal Aunt   . Diabetes Maternal Grandfather   . Anxiety disorder Maternal Grandmother   . Depression Maternal Grandmother     Social History: Reviewed social history from my progress note on 12/10/2017 Social History   Socioeconomic History  . Marital status: Married    Spouse name: [name]  . Number of children: 2  . Years of education: 28  . Highest education level: Associate degree: occupational, Hotel manager, or vocational program  Occupational History  . Occupation: Disabled  Tobacco Use  . Smoking status: Never Smoker  . Smokeless tobacco: Never Used  Vaping Use  . Vaping Use: Never used  Substance and Sexual Activity  . Alcohol use: No    Alcohol/week: 0.0 standard drinks  . Drug use: Never  . Sexual activity: Yes    Birth control/protection: None  Other Topics Concern  . Not on file  Social History Narrative   Lives at home with husband. [name]).    Disabled.   Education college.   Right handed.   2 cups caffeine/daily   Social Determinants of Health   Financial Resource Strain:   . Difficulty of Paying Living Expenses: Not on file  Food Insecurity:   . Worried About Charity fundraiser in the Last Year: Not on file  . Ran Out of Food in the Last Year: Not on file  Transportation Needs:   . Lack of Transportation (Medical): Not on file  . Lack of Transportation (Non-Medical): Not on file  Physical Activity:   . Days of Exercise per Week: Not on file  . Minutes of Exercise per Session: Not on file  Stress:   . Feeling of Stress : Not on file  Social Connections:   . Frequency of Communication with Friends and Family: Not on file  . Frequency of Social Gatherings with Friends and Family: Not on  file  . Attends Religious Services: Not on file  . Active Member of Clubs or Organizations: Not on file  . Attends Archivist Meetings: Not on file  . Marital Status: Not on file    Allergies:  Allergies  Allergen Reactions  . Penicillins Hives  . Amoxicillin Hives  . Depakote [Divalproex Sodium]     vomiting  . Lamictal [Lamotrigine]     Side effects of abdominal cramps, nausea , diarrhea ,nightmares    Metabolic Disorder Labs: Lab Results  Component Value Date   HGBA1C 5.0 11/13/2015   No results found for: PROLACTIN No results found for: CHOL, TRIG, HDL,  CHOLHDL, VLDL, [facility] Lab Results  Component Value Date   TSH 1.780 10/26/2015   TSH 0.525 03/15/2015    Therapeutic Level Labs: No results found for: LITHIUM Lab Results  Component Value Date   VALPROATE 10 (L) 07/28/2019   No components found for:  CBMZ  Current Medications: Current Outpatient Medications  Medication Sig Dispense Refill  . zonisamide (ZONEGRAN) 50 MG capsule Take 1 tab qhs for 1 week then 2 caps for total of 100 mg.    . busPIRone (BUSPAR) 10 MG tablet Take 1 tablet (10 mg total) by mouth 2 (two) times daily. 180 tablet 1  . Cholecalciferol (VITAMIN D-3) 1000 units CAPS Take by mouth daily.    . clonazePAM (KLONOPIN) 0.5 MG tablet Take 1 tablet (0.5 mg total) by mouth daily as needed for anxiety. AS NEEDED FOR SEVERE ANXIETY ATTACKS 15 tablet 0  . donepezil (ARICEPT) 10 MG tablet Take 10 mg by mouth every morning.    . fluPHENAZine (PROLIXIN) 5 MG tablet Take 1 tablet (5 mg total) by mouth daily. 30 tablet 2  . gabapentin (NEURONTIN) 300 MG capsule Take 1 capsule by mouth two times daily 180 capsule 2  . hydroxychloroquine (PLAQUENIL) 200 MG tablet     . hydrOXYzine (VISTARIL) 25 MG capsule Take 1 capsule (25 mg total) by mouth daily as needed (for severe anxiety). Anxiety/agitation- please limit use 90 capsule 0  . levothyroxine (SYNTHROID, LEVOTHROID) 175 MCG tablet Take 1 tablet  (175 mcg total) by mouth daily before breakfast. 90 tablet 3  . loperamide (IMODIUM A-D) 2 MG tablet Take 1 tablet (2 mg total) by mouth 4 (four) times daily as needed for diarrhea or loose stools. 30 tablet 0  . Multiple Vitamin (MULTI-VITAMIN) tablet Take 1 tablet by mouth daily.    Marland Kitchen omeprazole (PRILOSEC) 20 MG capsule Take 1 capsule (20 mg total) by mouth daily. 180 capsule 3  . predniSONE (DELTASONE) 5 MG tablet     . SUMAtriptan (IMITREX) 100 MG tablet     . traMADol (ULTRAM) 50 MG tablet      No current facility-administered medications for this visit.     Musculoskeletal: Strength & Muscle Tone: UTA Gait & Station: Seated Patient leans: N/A  Psychiatric Specialty Exam: Review of Systems  Psychiatric/Behavioral: Positive for sleep disturbance. The patient is nervous/anxious.   All other systems reviewed and are negative.   There were no vitals taken for this visit.There is no height or weight on file to calculate BMI.  General Appearance: Casual  Eye Contact:  Fair  Speech:  Clear and Coherent  Volume:  Normal  Mood:  Anxious  Affect:  Congruent  Thought Process:  Goal Directed and Descriptions of Associations: Intact  Orientation:  Full (Time, Place, and Person)  Thought Content: Hallucinations: Auditory improving  Suicidal Thoughts:  No  Homicidal Thoughts:  No  Memory:  Immediate;   Fair Recent;   Fair Remote;   Limited  Judgement:  Fair  Insight:  Fair  Psychomotor Activity:  Restlessness  Concentration:  Concentration: Fair and Attention Span: Fair  Recall:  [employer] of Knowledge: Fair  Language: Fair  Akathisia:  No  Handed:  Right  AIMS (if indicated): UTA  Assets:  Chief Executive Officer Social Support  ADL's:  Intact  Cognition: Impaired,  Mild  Sleep:  Poor   Screenings: AIMS     Office Visit from 06/15/2018 in [facility] Office Visit from 05/20/2018 in [facility]  AIMS Total  Score 5 9    GAD-7     Counselor from 11/11/2019 in [facility]  Total GAD-7 Score 18    Mini-Mental     Office Visit from 01/01/2016 in [facility] Office Visit from 11/13/2015 in [facility] Office Visit from 10/26/2015 in [facility] Office Visit from 04/25/2015 in [facility] Office Visit from 10/25/2014 in [facility]  Total Score (max 30 points ) 25 26 21 23 29     PHQ2-9     Counselor from 11/11/2019 in [facility] Office Visit from 02/08/2015 in [facility] Office Visit from 10/07/2014 in [facility]  PHQ-2 Total Score 6 0 4  PHQ-9 Total Score 21 - 8       Assessment and Plan: Sameen Leas is a 59 year old Caucasian female who has a history of bipolar disorder type II, hypothyroidism, cognitive disorder, insomnia, Sjogren's syndrome, fibromyalgia, was evaluated by telemedicine today.  Patient with psychosocial stressors of her own health issues, current pandemic.  Patient is currently making progress.  Plan as noted below.  Plan Bipolar disorder type II-improving Continue Prolixin 5 mg p.o. daily BuSpar 10 mg p.o. twice daily  GAD-improving BuSpar as prescribed Continue CBT with Ms.Hussami Continue Klonopin 0.5 mg as needed for severe panic attacks.  Patient to limit use.  Insomnia-some progress She is waiting for sleep study.  Neurocognitive disorder with behavioral changes-improving Unknown if psychosis is related to her neurocognitive issues and is separate from her bipolar disorder.  However it is very hard to separate it.  She however is currently making progress on the current medication.  She will continue to follow-up with neurology for cognitive issues.  I have reviewed GeneSight testing results while making medication changes today.  Follow-up in clinic in 4 weeks or sooner if  needed.  I have spent atleast 20 minutes face to face by video with patient today. More than 50 % of the time was spent for preparing to see the patient ( e.g., review of test, records ),  ordering medications and test ,psychoeducation and supportive psychotherapy and care coordination,as well as documenting clinical information in electronic health record. This note was generated in part or whole with voice recognition software. Voice recognition is usually quite accurate but there are transcription errors that can and very often do occur. I apologize for any typographical errors that were not detected and corrected.        Ursula Alert, MD 01/04/2020, 12:59 PM

## 2020-01-10 ENCOUNTER — Other Ambulatory Visit: Payer: Self-pay | Admitting: Psychiatry

## 2020-01-10 DIAGNOSIS — F3181 Bipolar II disorder: Secondary | ICD-10-CM

## 2020-01-10 DIAGNOSIS — F0281 Dementia in other diseases classified elsewhere with behavioral disturbance: Secondary | ICD-10-CM

## 2020-01-10 DIAGNOSIS — F02A18 Dementia in other diseases classified elsewhere, mild, with other behavioral disturbance: Secondary | ICD-10-CM

## 2020-01-27 ENCOUNTER — Ambulatory Visit (INDEPENDENT_AMBULATORY_CARE_PROVIDER_SITE_OTHER): Payer: Medicare HMO | Admitting: Licensed Clinical Social Worker

## 2020-01-27 ENCOUNTER — Other Ambulatory Visit: Payer: Self-pay

## 2020-01-27 DIAGNOSIS — F411 Generalized anxiety disorder: Secondary | ICD-10-CM

## 2020-01-27 DIAGNOSIS — F3181 Bipolar II disorder: Secondary | ICD-10-CM

## 2020-01-27 NOTE — Progress Notes (Signed)
Virtual Visit via Video Note  I connected with Stacy [name] on 01/27/20 at 10:00 AM EST by a video enabled telemedicine application and verified that I am speaking with the correct person using two identifiers.  Location: Patient: home Provider: [facility]   I discussed the limitations of evaluation and management by telemedicine and the availability of in person appointments. The patient expressed understanding and agreed to proceed.  The patient was advised to call back or seek an in-person evaluation if the symptoms worsen or if the condition fails to improve as anticipated.  I provided 20 minutes of non-face-to-face time during this encounter.   Duane Earnshaw R Hazeline Charnley, LCSW    THERAPIST PROGRESS NOTE  Session Time: 8:00-8:20  Participation Level: Active  Behavioral Response: NeatAlertDepressed  Type of Therapy: Individual Therapy  Treatment Goals addressed: Coping  Interventions: Supportive  Summary: Stacy [name] is a 59 y.o. female who presents with improving symptoms related to bipolar and depression diagnoses. PT reports that she has been compliant with medications (prolixin, klonopin). Pt reports that current medications are working well to stabilize mood and depression symptoms. Pt reports a decrease in anxiety and restlessness feelings that were bothering her previously.  Pt reporting good quality and quantity of sleep.   Pt denies any current external stressors--pt reports that she is using her coping skills to help manage on the "bad days". Pt reports that she is being intentional about social engagements--pt is going with a friend tomorrow to do some furniture thrift-shopping. Pt recognizes that positive social engagement and getting out of the house is an important part of overall treatment. Pt reports that she feels that life is in balance right now and that she is also being intentional about self care.   Encouraged pt to continue focusing on daily structure,  positive social support, focus on overall emotional/physical wellness, and life balance. Pt to return PRN.  Suicidal/Homicidal: No  SI, HI, or AVH reported at time of session.  Therapist Response: Stacy [name] continues to make good progress with overall self care and life management. Pt reports that mood is stable and that she is managing overall stress and anxiety better than before. Pt is demonstrating a growing capacity for enjoyment of relationships and activities outside of the home environment. This is all reflective of ongoing progress. Reviewed treatment goals w/ pt and pt feels initial counseling goals have been met at this time. Encouraged pt to reach out for review at any point in time.     Plan: Return again prn.  Diagnosis: Axis I: Bipolar, Depressed and Generalized Anxiety Disorder    Axis II: No diagnosis    Stacy Bo Harshan Kearley, LCSW 01/27/2020

## 2020-02-03 ENCOUNTER — Encounter: Payer: Self-pay | Admitting: Psychiatry

## 2020-02-03 ENCOUNTER — Other Ambulatory Visit: Payer: Self-pay

## 2020-02-03 ENCOUNTER — Telehealth (INDEPENDENT_AMBULATORY_CARE_PROVIDER_SITE_OTHER): Payer: Medicare HMO | Admitting: Psychiatry

## 2020-02-03 DIAGNOSIS — F3181 Bipolar II disorder: Secondary | ICD-10-CM

## 2020-02-03 DIAGNOSIS — F0281 Dementia in other diseases classified elsewhere with behavioral disturbance: Secondary | ICD-10-CM

## 2020-02-03 DIAGNOSIS — F411 Generalized anxiety disorder: Secondary | ICD-10-CM | POA: Diagnosis not present

## 2020-02-03 DIAGNOSIS — F02A18 Dementia in other diseases classified elsewhere, mild, with other behavioral disturbance: Secondary | ICD-10-CM

## 2020-02-03 NOTE — Progress Notes (Signed)
Virtual Visit via Video Note  I connected with Stacy [name] on 02/03/20 at  9:40 AM EST by a video enabled telemedicine application and verified that I am speaking with the correct person using two identifiers.  Location Provider Location : [facility] Patient Location : Home  Participants: Patient , Provider   I discussed the limitations of evaluation and management by telemedicine and the availability of in person appointments. The patient expressed understanding and agreed to proceed.   I discussed the assessment and treatment plan with the patient. The patient was provided an opportunity to ask questions and all were answered. The patient agreed with the plan and demonstrated an understanding of the instructions.   The patient was advised to call back or seek an in-person evaluation if the symptoms worsen or if the condition fails to improve as anticipated.  [facility] MD OP Progress Note  02/03/2020 10:59 AM Monzerat Handler  MRN:  [id number]  Chief Complaint:  Chief Complaint    Follow-up     HPI: Stacy [name] is a 59 year old Caucasian female, unemployed, married, lives in [place], has a history of bipolar disorder, GAD, major neurocognitive disorder, Sjogren's syndrome, hypertension, hypothyroidism, arthritis, migraine headaches was evaluated by telemedicine today.  Patient today reports last week she went through an episode of depressive symptoms when she could not get out of her bed and spent a lot of time crying.  She however reports she finally came out of it.  This week she has been fine.  She denies any significant sadness.  She continues to have restlessness however she reports she does not believe the Prolixin is contributing to it and she had the restlessness even before.  She reports so far she is tolerating the Prolixin well.  She reports sleep at night as good.  She continues to have auditory hallucinations of hearing voices which are pleasant.  She reports she hears  voices and she talks to them as though she is talking to a friend.  She reports the voices however is currently faint and is better than before on the Prolixin.  Patient denies any suicidality, homicidality.  Patient denies any other concerns today.  Visit Diagnosis:    ICD-10-CM   1. Bipolar 2 disorder (HCC)  F31.81    mixed, mild  2. GAD (generalized anxiety disorder)  F41.1   3. 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  Past trials of Paxil, Wellbutrin, Latuda, Lamictal, Seroquel, Depakote, Belsomra, Zyprexa, Abilify, Vraylar  Past Medical History:  Past Medical History:  Diagnosis Date  . Aneurysm ([place])   . Anxiety   . Arthritis   . Brain stem lesion   . Chronic kidney disease    kideny stone  . Depression   . Fibromyalgia   . GERD (gastroesophageal reflux disease)   . Headache   . Hypotension   . Hypothyroid   . Memory loss   . Migraine   . Raynaud disease   . Sjogren's syndrome ([place])   . Thyroid disease     Past Surgical History:  Procedure Laterality Date  . ABDOMINAL HYSTERECTOMY    . BASAL CELL CARCINOMA EXCISION    . BREAST BIOPSY    . CESAREAN SECTION    . EYE SURGERY    . KNEE SURGERY Left   . LITHOTRIPSY    . MUSCLE BIOPSY    .  OOPHORECTOMY      Family Psychiatric History: I have reviewed family psychiatric history from my progress note on 12/10/2017  Family History:  Family History  Problem Relation Age of Onset  . Migraines Mother   . Anxiety disorder Mother   . Depression Mother   . Cancer Father   . Hypertension Father   . Stroke Father   . Heart attack Father   . Alcohol abuse Father   . Diabetes Brother   . Alcohol abuse Sister   . Drug abuse Sister   . Anxiety disorder Sister   . Depression Sister   . Drug abuse Brother   . Cancer Paternal Aunt   . Lupus Paternal Aunt    . Thyroid disease Paternal Aunt   . Diabetes Maternal Grandfather   . Anxiety disorder Maternal Grandmother   . Depression Maternal Grandmother     Social History: Reviewed social history from my progress note on 12/10/2017 Social History   Socioeconomic History  . Marital status: Married    Spouse name: Stacy [name]  . Number of children: 2  . Years of education: 20  . Highest education level: Associate degree: occupational, Hotel manager, or vocational program  Occupational History  . Occupation: Disabled  Tobacco Use  . Smoking status: Never Smoker  . Smokeless tobacco: Never Used  Vaping Use  . Vaping Use: Never used  Substance and Sexual Activity  . Alcohol use: No    Alcohol/week: 0.0 standard drinks  . Drug use: Never  . Sexual activity: Yes    Birth control/protection: None  Other Topics Concern  . Not on file  Social History Narrative   Lives at home with husband. Stacy [name]).    Disabled.   Education college.   Right handed.   2 cups caffeine/daily   Social Determinants of Health   Financial Resource Strain: Not on file  Food Insecurity: Not on file  Transportation Needs: Not on file  Physical Activity: Not on file  Stress: Not on file  Social Connections: Not on file    Allergies:  Allergies  Allergen Reactions  . Penicillins Hives  . Amoxicillin Hives  . Depakote [Divalproex Sodium]     vomiting  . Lamictal [Lamotrigine]     Side effects of abdominal cramps, nausea , diarrhea ,nightmares    Metabolic Disorder Labs: Lab Results  Component Value Date   HGBA1C 5.0 11/13/2015   No results found for: PROLACTIN No results found for: CHOL, TRIG, HDL, CHOLHDL, VLDL, LDLCALC Lab Results  Component Value Date   TSH 1.780 10/26/2015   TSH 0.525 03/15/2015    Therapeutic Level Labs: No results found for: LITHIUM Lab Results  Component Value Date   VALPROATE 10 (L) 07/28/2019   No components found for:  CBMZ  Current Medications: Current Outpatient  Medications  Medication Sig Dispense Refill  . busPIRone (BUSPAR) 10 MG tablet Take 1 tablet (10 mg total) by mouth 2 (two) times daily. 180 tablet 1  . Cholecalciferol (VITAMIN D-3) 1000 units CAPS Take by mouth daily.    . clonazePAM (KLONOPIN) 0.5 MG tablet Take 1 tablet (0.5 mg total) by mouth daily as needed for anxiety. AS NEEDED FOR SEVERE ANXIETY ATTACKS 15 tablet 0  . donepezil (ARICEPT) 10 MG tablet Take 10 mg by mouth every morning.    . fluPHENAZine (PROLIXIN) 5 MG tablet TAKE 1 TABLET(5 MG) BY MOUTH DAILY 30 tablet 1  . gabapentin (NEURONTIN) 300 MG capsule Take 1 capsule by mouth two times daily 180  capsule 2  . hydroxychloroquine (PLAQUENIL) 200 MG tablet     . hydrOXYzine (VISTARIL) 25 MG capsule Take 1 capsule (25 mg total) by mouth daily as needed (for severe anxiety). Anxiety/agitation- please limit use 90 capsule 0  . levothyroxine (SYNTHROID, LEVOTHROID) 175 MCG tablet Take 1 tablet (175 mcg total) by mouth daily before breakfast. 90 tablet 3  . loperamide (IMODIUM A-D) 2 MG tablet Take 1 tablet (2 mg total) by mouth 4 (four) times daily as needed for diarrhea or loose stools. 30 tablet 0  . Multiple Vitamin (MULTI-VITAMIN) tablet Take 1 tablet by mouth daily.    Marland Kitchen omeprazole (PRILOSEC) 20 MG capsule Take 1 capsule (20 mg total) by mouth daily. 180 capsule 3  . predniSONE (DELTASONE) 5 MG tablet     . SUMAtriptan (IMITREX) 100 MG tablet     . traMADol (ULTRAM) 50 MG tablet     . zonisamide (ZONEGRAN) 50 MG capsule Take 1 tab qhs for 1 week then 2 caps for total of 100 mg.     No current facility-administered medications for this visit.     Musculoskeletal: Strength & Muscle Tone: UTA Gait & Station: UTA Patient leans: N/A  Psychiatric Specialty Exam: Review of Systems  Psychiatric/Behavioral: Positive for hallucinations. The patient is nervous/anxious.   All other systems reviewed and are negative.   There were no vitals taken for this visit.There is no height or  weight on file to calculate BMI.  General Appearance: Casual  Eye Contact:  Fair  Speech:  Clear and Coherent  Volume:  Normal  Mood:  Anxious  Affect:  Appropriate  Thought Process:  Goal Directed and Descriptions of Associations: Intact  Orientation:  Full (Time, Place, and Person)  Thought Content: Hallucinations: Auditory fainter  Suicidal Thoughts:  No  Homicidal Thoughts:  No  Memory:  Immediate;   Fair Recent;   Fair Remote;   Fair  Judgement:  Fair  Insight:  Fair  Psychomotor Activity:  Restlessness  Concentration:  Concentration: Fair and Attention Span: Fair  Recall:  [employer] of Knowledge: Fair  Language: Fair  Akathisia:  No  Handed:  Right  AIMS (if indicated): UTA  Assets:  Communication Skills Desire for Improvement Housing Social Support  ADL's:  Intact  Cognition: WNL  Sleep:  Fair   Screenings: [place] Office Visit from 06/15/2018 in [facility] Office Visit from 05/20/2018 in [facility] Total Score 5 9    GAD-7   Flowsheet Row Counselor from 11/11/2019 in [facility]  Total GAD-7 Score 18    [place] Visit from 01/01/2016 in [facility] Office Visit from 11/13/2015 in [facility] Office Visit from 10/26/2015 in [facility] Office Visit from 04/25/2015 in [facility] Office Visit from 10/25/2014 in [facility]  Total Score (max 30 points ) 25 26 21 23 29     PHQ2-9   Flowsheet Row Counselor from 11/11/2019 in [facility] Office Visit from 02/08/2015 in [facility] Office Visit from 10/07/2014 in [facility]  PHQ-2 Total Score 6 0 4  PHQ-9 Total Score 21 -- 8       Assessment and Plan: Rashawnda Gaba is a 59 year old Caucasian female who has a history of bipolar  disorder type II, hypothyroidism, cognitive disorder, insomnia, Sjogren's syndrome, fibromyalgia was evaluated by telemedicine today.  Patient with psychosocial  stressors of her own health issues, current pandemic.  Patient with recent episode of depression however is not interested in medication readjustment .Plan as noted below.  Plan Bipolar disorder type II-improving Prolixin 5 mg p.o. daily BuSpar 10 mg p.o. twice daily   GAD-improving BuSpar as prescribed Continue CBT with Ms.Hussami Patient to continue Klonopin 0.5 mg as needed for severe restlessness and anxiety attacks.  Insomnia-improving Monitor closely.  Neurocognitive disorder with behavioral changes-improving She will continue to follow-up with neurology.  I have reviewed [employer] testing results while making medication changes today.  I have spent atleast 20 minutes face to face by video with patient today. More than 50 % of the time was spent for preparing to see the patient ( e.g., review of test, records ), ordering medications and test ,psychoeducation and supportive psychotherapy and care coordination,as well as documenting clinical information in electronic health record. This note was generated in part or whole with voice recognition software. Voice recognition is usually quite accurate but there are transcription errors that can and very often do occur. I apologize for any typographical errors that were not detected and corrected.      Ursula Alert, MD 02/03/2020, 10:59 AM

## 2020-02-14 ENCOUNTER — Telehealth: Payer: Self-pay | Admitting: *Deleted

## 2020-02-14 DIAGNOSIS — F3181 Bipolar II disorder: Secondary | ICD-10-CM

## 2020-02-14 MED ORDER — FLUPHENAZINE HCL 10 MG PO TABS: 10.0000 mg | ORAL_TABLET | Freq: Every day | ORAL | 1 refills | Status: DC

## 2020-02-14 NOTE — Telephone Encounter (Signed)
Returned call to patient.  She reports she is depressed.  Interested in increasing the dosage of Prolixin. Will increase it to 10 mg p.o. daily. Patient has had side effects to multiple medication trials and this is 1 medication that she has had no side effects to till now.  Patient reports she would like to have another therapist since she does not feel the current therapy sessions is helpful.  Discussed referral for IOP.  She agrees with plan.

## 2020-02-14 NOTE — Telephone Encounter (Signed)
Patient call and stated that her depression had gotten much worse and asked if you would increase her medication.  I checked the last visit notes and at that time she did not show an interest in changing them.  She feels she cant shake the depression at this time please review.

## 2020-02-15 ENCOUNTER — Telehealth (HOSPITAL_COMMUNITY): Payer: Self-pay | Admitting: Psychiatry

## 2020-02-15 ENCOUNTER — Ambulatory Visit (HOSPITAL_COMMUNITY): Payer: Medicare HMO | Admitting: Psychiatry

## 2020-02-15 NOTE — Telephone Encounter (Signed)
D:  Dr. Shea Evans referred pt to MH-IOP/PHP.  A:  Oriented pt.  After updating the [facility], it appears that pt will be appropriate for MH-IOP.  Pt will start tomorrow.  Inform Dr. Shea Evans and Ricky Ala, NP.  Encouraged pt to verify her benefits.  R:  Pt receptive.
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
## 2020-02-16 ENCOUNTER — Other Ambulatory Visit (HOSPITAL_COMMUNITY): Payer: Medicare HMO | Attending: Psychiatry | Admitting: Psychiatry

## 2020-02-16 ENCOUNTER — Other Ambulatory Visit: Payer: Self-pay

## 2020-02-16 ENCOUNTER — Encounter (HOSPITAL_COMMUNITY): Payer: Self-pay | Admitting: Psychiatry

## 2020-02-16 DIAGNOSIS — F3181 Bipolar II disorder: Secondary | ICD-10-CM | POA: Diagnosis present

## 2020-02-16 DIAGNOSIS — F419 Anxiety disorder, unspecified: Secondary | ICD-10-CM | POA: Insufficient documentation

## 2020-02-16 NOTE — Progress Notes (Signed)
Virtual Visit via Video Note  I connected with Stacy [name] on 02/16/20 at  9:00 AM EST by a video enabled telemedicine application and verified that I am speaking with the correct person using two identifiers.  At orientation to the IOP program, Case Manager discussed the limitations of evaluation and management by telemedicine and the availability of in person appointments. The patient expressed understanding and agreed to proceed with virtual visits throughout the duration of the program.   Location:  Patient: Patient Home Provider: Home Office   History of Present Illness: Bipolar 2 DO  Observations/Objective: Check In: Case Manager checked in with all participants to review discharge dates, insurance authorizations, work-related documents and needs from the treatment team.     Initial Therapeutic Activity: Client stated needs and engaged in discussion. Counselor facilitated a group processing with group members to assess mood and current functioning. Client joined group today, sharing about her need for treatment, history of mental health, about her support system and what she hopes to get out of treatment. Client is dealing with a depressive episode and marital concerns. Group discussed mental health services, outcomes, advocating for mental health needs, future planning. Client presents with severe depression and severe anxiety. Client denied any current SI/HI/psychosis.    Second Therapeutic Activity: Counselor introduced group to the resource Therapy In a Nutshell, which features a series of videos by Anne Fu, LMFT on Mental Health issues, concepts and skills. Counselor shared a video about skills on combating the urge to suppress emotions. Counselor processed information presented with group afterwards to application and takeaways. Client engaged in discussion and resonated with concepts, with intention for application and further exploration.  Check Out: Counselor closed group  by checking in with Clients to determine what self-care practice or productivity activity they can engage today to alleviate stress/anxiety. Client plans to journal and reflect on group activities. Client endorsed safety plan to be followed to prevent safety issues.  Assessment and Plan: Clinician recommends that Client remain in IOP treatment to better manage mental health symptoms, stabilization and to address treatment plan goals. Clinician recommends adherence to crisis/safety plan, taking medications as prescribed, and following up with medical professionals if any issues arise.   Follow Up Instructions: Clinician will send Webex link for next session. The Client was advised to call back or seek an in-person evaluation if the symptoms worsen or if the condition fails to improve as anticipated.     I provided 180 minutes of non-face-to-face time during this encounter.     Lise Auer, LCSW

## 2020-02-17 ENCOUNTER — Other Ambulatory Visit: Payer: Self-pay

## 2020-02-17 ENCOUNTER — Telehealth: Payer: Self-pay | Admitting: *Deleted

## 2020-02-17 ENCOUNTER — Other Ambulatory Visit (HOSPITAL_COMMUNITY): Payer: Medicare HMO

## 2020-02-17 ENCOUNTER — Telehealth (HOSPITAL_COMMUNITY): Payer: Self-pay | Admitting: Psychiatry

## 2020-02-17 ENCOUNTER — Ambulatory Visit (HOSPITAL_COMMUNITY)
Admission: EM | Admit: 2020-02-17 | Discharge: 2020-02-17 | Disposition: A | Discharge status: Home / Self Care | Payer: Medicare HMO | Attending: Psychiatry | Admitting: Psychiatry

## 2020-02-17 DIAGNOSIS — F411 Generalized anxiety disorder: Secondary | ICD-10-CM | POA: Insufficient documentation

## 2020-02-17 DIAGNOSIS — F3181 Bipolar II disorder: Secondary | ICD-10-CM | POA: Insufficient documentation

## 2020-02-17 NOTE — ED Triage Notes (Signed)
Patient states "I have been really depressed and anxious. " Patient denies SI/HI. Patient states she had an adjustment in her medications a couple of days ago for increased depression and hallucinations (auditory/visual). Patient is not sleeping well has interrupted sleep. Patient is eating less states not really hunger and she has been on a diet for over a month using [employer] [facility] weight loss program. Concentration has been poor and states "I can't relax." Patient unable to identify triggers to increase in depression.

## 2020-02-17 NOTE — ED Provider Notes (Signed)
Behavioral Health Urgent Care Medical Screening Exam  Patient Name: Stacy [name] MRN: [id number] Date of Evaluation: 02/17/20 Chief Complaint: Chief Complaint/Presenting Problem: NA Diagnosis:  Final diagnoses:  GAD (generalized anxiety disorder)  Bipolar 2 disorder ([place])    History of Present illness: Stacy [name] is a 59 y.o. female with a history of bipolar 2, mild MCD,  and anxiety who presents voluntarily to [facility] for assessment. Patient is accompanied by her husband, [name], who is present for assessment. Patient reports she had a "nervous breakdown" today. When asked for clarification, she states that was on the phone with the insurance company about an appointment referral which was overwhelming and led to her 'bawling". She states that she attempted to join the virtual PHP program earlier today but felt overwhelmed with that as well which she attributes to having difficulties hearing what was said. Husband states that he was not aware of her computer issues and that he would assist her in the future if this was an issue. She states that her anxiety has been severe the last several months and that she wakes up feeling as though she is going to "jump out of my skin". No particular trigger was able to be identified. She reports depressed mood and rate it a 3/10 (10 being the happiest) and states that it has been at least a month since she would rate her mood as better than a 3. She states that she sees Dr. Shea Evans and that her current medications she is on is the "best" combination that has been found so far since it doesn't make her feel nauseas. She denies SI/HI/AVH. No safety concerns per husband who states that he calls her frequently during the day to check on her. When asked what we could assister her with today, she states that she would like a new therapist. Pt does not currently meet criteria for inpatient admission and  nor husband feel that inpatient admission is warranted at this  time  Past Psychiatric History: Previous Medication Trials: Past trials of Paxil, Wellbutrin, Latuda, Lamictal, Seroquel, Depakote, Belsomra, Zyprexa, Abilify, Vraylar plus current medications Previous Psychiatric Hospitalizations: no Previous Suicide Attempts: denies History of Violence: denies Outpatient psychiatrist: yes, Dr. Shea Evans  Social History: Marital Status: married Children: 3 Source of Income: on disability Housing Status: with spouse History of phys/sexual abuse: did not assess Easy access to gun: no  Substance Use (with emphasis over the last 12 months) Recreational Drugs: denies Use of Alcohol: denied Tobacco Use: denied Rehab History: denied H/O Complicated Withdrawal: denied   Psychiatric Specialty Exam  Presentation  General Appearance:Appropriate for Environment; Casual; Fairly Groomed  Eye Contact:Fair  Speech:Clear and Coherent  Speech Volume:Normal  Handedness:Right   Mood and Affect  Mood:Depressed; Anxious  Affect:Appropriate; Congruent; Other (comment) (anxious)   Thought Process  Thought Processes:Coherent; Goal Directed; Linear  Descriptions of Associations:Intact  Orientation:Full (Time, Place and Person)  Thought Content:WDL  Hallucinations:None  Ideas of Reference:None  Suicidal Thoughts:No  Homicidal Thoughts:No   Sensorium  Memory:Immediate Good; Recent Good  Judgment:Fair  Insight:Fair   Executive Functions  Concentration:Fair  Attention Span:Fair  [place]  Language:Good   Psychomotor Activity  Psychomotor Activity:Normal   Assets  Assets:Communication Skills; Desire for Improvement; Housing; Intimacy; Resilience; Physical Health; Social Support   Sleep  Sleep:Poor  Number of hours: No data recorded  Physical Exam: Physical Exam Constitutional:      Appearance: Normal appearance. She is normal weight.     Comments: Anxious appearing  HENT:     Head:  Normocephalic and atraumatic.  Eyes:     Extraocular Movements: Extraocular movements intact.  Pulmonary:     Effort: Pulmonary effort is normal.  Neurological:     Mental Status: She is alert.    Review of Systems  Constitutional: Negative for chills and fever.  Eyes: Negative for discharge and redness.  Neurological: Negative for focal weakness.  Psychiatric/Behavioral: Positive for depression. Negative for substance abuse and suicidal ideas.   Blood pressure 123/70, pulse 94, temperature 98.2 F (36.8 C), temperature source Tympanic, resp. rate 18, height 5\' 7"  (1.702 m), weight 79.8 kg, SpO2 100 %. Body mass index is 27.57 kg/m.  Musculoskeletal: Strength & Muscle Tone: within normal limits Gait & Station: normal Patient leans: N/A   [place] MSE Discharge Disposition for Follow up and Recommendations: Based on my evaluation the patient does not appear to have an emergency medical condition and can be discharged with resources and follow up care in outpatient services for Individual Therapy   Discussed patient continuing PHP program as that is the most appropriate level of care at this time. She does not meet criteria for inpatient admission. Pt verbalized understanding and stated that she will continue virtual PHP and stated that she will ask for assistance if/when technical issues arise.   Ival Bible, MD 02/17/2020, 4:12 PM

## 2020-02-17 NOTE — Telephone Encounter (Signed)
D:  Placed call to check on patient since she hadn't logged into group.  Pt's husband answered the phone and put pt on.  Pt was very tearful and stated that she and her husband were in the the middle of talking.  Pt requested to be excused today.  A:  Excused pt today.  Will check on pt later.  Inform Ricky Ala, NP.

## 2020-02-17 NOTE — ED Notes (Signed)
Patient discharged home. AVS/Follow-Up appointment reviewed with pt and written copy given to patient and she verbalized understanding. Patient denies SI/HI at discharge. Patient escorted off unit by staff.

## 2020-02-17 NOTE — Telephone Encounter (Signed)
Patient's husband called and stated that patient was in crisis and having "a nervous breakdown. Directed him to take her to [facility] ED.  Husband was angry and upset she could not speak with her therapist or MD.  After speaking to Dr Shea Evans husband was told to take her to the [facility] in [place].  Address and phone number provided.  He calmed down and agreed to take her there.

## 2020-02-17 NOTE — BH Assessment (Signed)
Comprehensive Clinical Assessment (CCA) Note  02/17/2020 Stacy [name] [id number]   Patient is a 59 year old female presenting voluntarily to [facility] for assessment. Patient is accompanied by her husband, Stacy [name], who is present for assessment. Patient reports she had a "nervous breakdown" today. She is unable to identify a trigger. She reports for the past 1-2 months she "has not been doing well." She endorses depressive symptoms of fatigue, poor motivation, and poor concentration. Patient reports her anxiety makes her feel "like I'm crawling out of my skin." She states it is difficult for her to complete household chores but is tending to her ADLs. She denies SI/HI/AVH. She denies any prior psychiatric hospitalizations. Patient goes to [facility] outpatient for med management and therapy. Patient reports she started groups yesterday but did not find it helpful as it was virtual. Patient denies any substance use or criminal charges.   Per husband, Tom: Patient does not communicate how she is feeling with him. He states that today patient became upset after attempting to schedule an outpatient therapy appointment. She became tearful and overwhelmed. Patient's psychiatrist suggested she come here for evaluation.  Per Dr. Serafina Mitchell patient does not meet in patient care criteria. Patient to follow up with PHP.  Chief Complaint:  Chief Complaint  Patient presents with  . Depression  . Anxiety   Visit Diagnosis: F33.2 MDD, recurrent, severe    F41.1 GAD   CCA Screening, Triage and Referral (STR)  Patient Reported Information How did you hear about [place]? Primary Care (Phreesia 02/17/2020)  Referral name: Dr Shea Evans ([place] 02/17/2020)  Referral phone number: No data recorded  Whom do you see for routine medical problems? Primary Care (Phreesia 02/17/2020)  Practice/Facility Name: [facility] ([place] 02/17/2020)  Practice/Facility Phone Number: No data recorded Name of Contact: Na ([place]  02/17/2020)  Contact Number: [phone] ([place] 02/17/2020)  Contact Fax Number: No data recorded Prescriber Name: Leda Min 02/17/2020)  Prescriber Address (if known): Na ([place] 02/17/2020)   What Is the Reason for Your Visit/Call Today? feeling Of Lost Control (Phreesia 02/17/2020)  How Long Has This Been Causing You Problems? 1-6 months (Phreesia 02/17/2020)  What Do You Feel Would Help You the Most Today? Therapy (Phreesia 02/17/2020)   Have You Recently Been in Any Inpatient Treatment (Hospital/Detox/Crisis Center/28-Day Program)? No (Phreesia 02/17/2020)  Name/Location of Program/Hospital:No data recorded How Long Were You There? No data recorded When Were You Discharged? No data recorded  Have You Ever Received Services From [facility] Before? Yes (Phreesia 02/17/2020)  Who Do You See at [facility]? Dr Shea Evans [place] 02/17/2020)   Have You Recently Had Any Thoughts About Hurting Yourself? No (Phreesia 02/17/2020)  Are You Planning to Commit Suicide/Harm Yourself At This time? No (Phreesia 02/17/2020)   Have you Recently Had Thoughts About [place]? No (Phreesia 02/17/2020)  Explanation: No data recorded  Have You Used Any Alcohol or Drugs in the Past 24 Hours? No (Phreesia 02/17/2020)  How Long Ago Did You Use Drugs or Alcohol? No data recorded What Did You Use and How Much? No data recorded  Do You Currently Have a Therapist/Psychiatrist? Yes (Phreesia 02/17/2020)  Name of Therapist/Psychiatrist: Dr Shea Evans Roy[place] Purl 02/17/2020)   Have You Been Recently Discharged From Any Office Practice or Programs? No (Phreesia 02/17/2020)  Explanation of Discharge From Practice/Program: No data recorded    CCA Screening Triage Referral Assessment Type of Contact: Face-to-Face  Is this Initial or Reassessment? No data recorded Date Telepsych consult ordered in [facility]:  No data  recorded Time Telepsych consult ordered in [facility]:  No data  recorded  Patient Reported Information Reviewed? Yes  Patient Left Without Being Seen? No data recorded Reason for Not Completing Assessment: No data recorded  Collateral Involvement: husband, Stacy [name]   Does Patient Have a [place]? No data recorded Name and Contact of Legal Guardian: No data recorded If Minor and Not Living with Parent(s), Who has Custody? No data recorded Is CPS involved or ever been involved? Never  Is APS involved or ever been involved? Never   Patient Determined To Be At Risk for Harm To Self or Others Based on Review of Patient Reported Information or Presenting Complaint? No  Method: No data recorded Availability of Means: No data recorded Intent: No data recorded Notification Required: No data recorded Additional Information for Danger to Others Potential: No data recorded Additional Comments for Danger to Others Potential: No data recorded Are There Guns or Other Weapons in Your Home? No data recorded Types of Guns/Weapons: No data recorded Are These Weapons Safely Secured?                            No data recorded Who Could Verify You Are Able To Have These Secured: No data recorded Do You Have any Outstanding Charges, Pending Court Dates, Parole/Probation? No data recorded Contacted To Inform of Risk of Harm To Self or Others: No data recorded  Location of Assessment: [facility] Assessment Services   Does Patient Present under Involuntary Commitment? No  IVC Papers Initial File Date: No data recorded  [place] of Residence: [place]   Patient Currently Receiving the Following Services: Partial Hospitalization; Medication Management   Determination of Need: Routine (7 days)   Options For Referral: Partial Hospitalization; Medication Management     CCA Biopsychosocial Intake/Chief Complaint:  NA  Current Symptoms/Problems: NA   Patient Reported Schizophrenia/Schizoaffective Diagnosis in Past: No   Strengths:  NA  Preferences: NA  Abilities: NA   Type of Services Patient Feels are Needed: NA   Initial Clinical Notes/Concerns: NA   Mental Health Symptoms Depression:  Change in energy/activity; Difficulty Concentrating; Fatigue; Hopelessness; Increase/decrease in appetite; Sleep (too much or little); Tearfulness; Irritability   Duration of Depressive symptoms: Greater than two weeks   Mania:  None   Anxiety:   Difficulty concentrating; Fatigue; Irritability; Restlessness; Sleep; Tension; Worrying   Psychosis:  None   Duration of Psychotic symptoms: No data recorded  Trauma:  Avoids reminders of event; Detachment from others; Difficulty staying/falling asleep; Emotional numbing; Irritability/anger; Guilt/shame; Re-experience of traumatic event   Obsessions:  N/A   Compulsions:  N/A   Inattention:  N/A   Hyperactivity/Impulsivity:  N/A   Oppositional/Defiant Behaviors:  N/A   Emotional Irregularity:  N/A   Other Mood/Personality Symptoms:  None reported.     Mental Status Exam Appearance and self-care  Stature:  Average   Weight:  Average weight   Clothing:  Neat/clean   Grooming:  Well-groomed   Cosmetic use:  Age appropriate   Posture/gait:  Normal   Motor activity:  Not Remarkable   Sensorium  Attention:  Normal   Concentration:  Normal   Orientation:  X5   Recall/memory:  Normal   Affect and Mood  Affect:  Anxious   Mood:  Anxious   Relating  Eye contact:  Normal   Facial expression:  Anxious; Depressed   Attitude toward examiner:  Cooperative   Thought and Language  Speech flow: Normal   Thought content:  Appropriate to Mood and Circumstances   Preoccupation:  -- (N/A)   Hallucinations:  None (N/A)   Organization:  No data recorded  [employer] of Knowledge:  Average   Intelligence:  Average   Abstraction:  Concrete   Judgement:  Normal   Reality Testing:  Realistic   Insight:  Good   Decision Making:   Normal   Social Functioning  Social Maturity:  Isolates   Social Judgement:  Normal   Stress  Stressors:  Transitions; Illness   Coping Ability:  Overwhelmed   Skill Deficits:  Activities of daily living; Decision making; Self-care   Supports:  Family     Religion: Religion/Spirituality Are You A Religious Person?: No How Might This Affect Treatment?: N/A  Leisure/Recreation: Leisure / Recreation Do You Have Hobbies?: Yes Leisure and Hobbies: hx of reading and doing needlepoint  Exercise/Diet: Exercise/Diet Do You Exercise?: No Have You Gained or Lost A Significant Amount of Weight in the Past Six Months?: No Do You Follow a Special Diet?: No Do You Have Any Trouble Sleeping?: Yes Explanation of Sleeping Difficulties: "Trouble falling asleep and staying"   CCA Employment/Education Employment/Work Situation: Employment / Work Situation Employment situation: On disability Why is patient on disability: mental and physical health  How long has patient been on disability: 9 years ago What is the longest time patient has a held a job?: 15 years  Where was the patient employed at that time?: Engineer, structural  Has patient ever been in the [employer]?: No  Education: Education Last Grade Completed: 14 Name of Rosita: Grant  Did Teacher, adult education From [employer]?: Yes Did Physicist, medical?: Yes What Type of College Degree Do you Have?: Associates Degree Did You Attend Graduate School?: No What Was Your Major?: Business Administration  Did You Have Any Special Interests In School?: "No, I worked through [employer] school so I could buy clothes and take care of myself."  Did You Have An Individualized Education Program (IIEP): No Did You Have Any Difficulty At School?: No   CCA Family/Childhood History Family and Relationship History: Family history Marital status: Married What types of issues is patient dealing with in the relationship?: "We've  been through good times and bad times. He's very supportive."  Additional relationship information: Second marriage currently. Divorced from first marriage in 85. Are you sexually active?: No What is your sexual orientation?: Heterosexual  Has your sexual activity been affected by drugs, alcohol, medication, or emotional stress?: N/A Does patient have children?: Yes How many children?: 2 How is patient's relationship with their children?: two sons from first marriage, both adults (ages 29 & 52). "It's getting better now. We had strained relationships. They went through really bad rebellious years. They don't talk to my husband anymore, but they're coming around to talking to me. We're involved with the grandkids."  Childhood History:  Childhood History By whom was/is the patient raised?: Both parents Additional childhood history information: "Dad was an alcoholic and a gambler. Mom had Bipolar and was commited constantly when I was growing up. At one point all the kids were split up and sent to live with realitives."  Description of patient's relationship with caregiver when they were a child: Mom: "We really didn't have a relationship. She tried. She was committed for most of my life. She was hardly ever home." Dad: "It was good. He really tried. When he was home, he tried  to do special things for [place]."  How were you disciplined when you got in trouble as a child/adolescent?: "I wasn't really by dad. Mom was the disciplinary when she was home. She would spank or yell, or ground [place]."  Does patient have siblings?: Yes Description of patient's current relationship with siblings: two brothers and a sister. Sister: "We've mended our relationship about eight years ago. We're extremely close. We talk on the phone every day." Brother Marden Noble): "We were extremely close all through school. We looked out for each other. We talk on the phone. He's not much of a phone talker though. He lives with my parents."  Brother Rush Landmark): "Never had a relationship with him. I'm trying now. I gave him a hug and told him I loved him at [place], and that shocked him."  Did patient suffer any verbal/emotional/physical/sexual abuse as a child?: Yes Has patient ever been sexually abused/assaulted/raped as an adolescent or adult?: No Witnessed domestic violence?: No Has patient been affected by domestic violence as an adult?: No  Child/Adolescent Assessment:     CCA Substance Use Alcohol/Drug Use: Alcohol / Drug Use Pain Medications: SEE MAR Prescriptions: SEE MAR Over the Counter: SEE MAR History of alcohol / drug use?: No history of alcohol / drug abuse                         ASAM's:  Six Dimensions of Multidimensional Assessment  Dimension 1:  Acute Intoxication and/or Withdrawal Potential:      Dimension 2:  Biomedical Conditions and Complications:      Dimension 3:  Emotional, Behavioral, or Cognitive Conditions and Complications:     Dimension 4:  Readiness to Change:     Dimension 5:  Relapse, Continued use, or Continued Problem Potential:     Dimension 6:  Recovery/Living Environment:     ASAM Severity Score:    ASAM Recommended Level of Treatment:     Substance use Disorder (SUD)    Recommendations for Services/Supports/Treatments: Recommendations for Services/Supports/Treatments Recommendations For Services/Supports/Treatments: IOP (Intensive Outpatient Program)  DSM5 Diagnoses: Patient Active Problem List   Diagnosis Date Noted  . 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 Mild cognitive disorder 07/09/2019  . Bipolar 2 disorder ([place]) 09/25/2018  . GAD (generalized anxiety disorder) 09/25/2018  . Insomnia due to mental condition 09/25/2018  . 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 Mild cognitive impairment 01/01/2016  . 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 Acquired  hypothyroidism 06/19/2015  . High risk medication use 05/25/2015  . Chronic lumbosacral pain 05/08/2015  . Left lower quadrant pain 02/08/2015  . Diverticulitis of large intestine without perforation or abscess without bleeding 02/08/2015  . Fibromyalgia 10/07/2014  . Hypothyroidism 10/07/2014  . History of kidney stones 10/07/2014  . Major depressive disorder, recurrent episode, in partial remission with anxious distress ([place]) 10/07/2014  . Overweight (BMI 25.0-29.9) 10/07/2014  . Dementia without behavioral disturbance ([place]) 06/03/2014  . Common migraine with intractable migraine 09/23/2013  . Basal cell carcinoma 07/13/2013  . Hot flash, menopausal 07/13/2013  . 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 Calculus of kidney 07/13/2013  . Headache, migraine 07/13/2013  . 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 Blush 07/13/2013  . Sjogren's syndrome ([place]) 07/13/2013  . Hot flashes 07/13/2013    Patient Centered Plan: Patient is on the following Treatment Plan(s):     Referrals  to Alternative Service(s): Referred to Alternative Service(s):   Place:   Date:   Time:    Referred to Alternative Service(s):   Place:   Date:   Time:    Referred to Alternative Service(s):   Place:   Date:   Time:    Referred to Alternative Service(s):   Place:   Date:   Time:     Orvis Brill, LCSW

## 2020-02-17 NOTE — Discharge Instructions (Signed)

## 2020-02-18 ENCOUNTER — Telehealth (HOSPITAL_COMMUNITY): Payer: Self-pay | Admitting: Psychiatry

## 2020-02-18 ENCOUNTER — Other Ambulatory Visit (HOSPITAL_COMMUNITY): Payer: Medicare HMO | Admitting: Psychiatry

## 2020-02-22 ENCOUNTER — Other Ambulatory Visit: Payer: Self-pay

## 2020-02-22 ENCOUNTER — Other Ambulatory Visit (HOSPITAL_COMMUNITY): Payer: Medicare HMO

## 2020-02-23 ENCOUNTER — Other Ambulatory Visit: Payer: Self-pay

## 2020-02-23 ENCOUNTER — Other Ambulatory Visit (HOSPITAL_COMMUNITY): Payer: Medicare HMO

## 2020-02-23 ENCOUNTER — Telehealth: Payer: Self-pay | Admitting: *Deleted

## 2020-02-23 DIAGNOSIS — F3181 Bipolar II disorder: Secondary | ICD-10-CM

## 2020-02-23 NOTE — Telephone Encounter (Signed)
Patient called and reported that since the increase in Prolixin she has been extremely tired and is hardly able to get out of bed and function.  She reports her depression has been much improved and other than this side effect she is quite pleased with how she feels.  She takes the medication at night and is groggy in the morning.  Please review.

## 2020-02-24 ENCOUNTER — Other Ambulatory Visit: Payer: Self-pay

## 2020-02-24 ENCOUNTER — Other Ambulatory Visit (HOSPITAL_COMMUNITY): Payer: Medicare HMO

## 2020-02-24 MED ORDER — FLUPHENAZINE HCL 10 MG PO TABS: 5.0000 mg | ORAL_TABLET | Freq: Every day | ORAL | 1 refills | Status: DC

## 2020-02-24 NOTE — Telephone Encounter (Signed)
Returned call to patient.  She reports she is not as depressed as she was before.  She is also in the intensive outpatient program.  She however believes the Prolixin is giving her side effects, she is unable to get up and function and feels groggy all day.  She currently takes it at 7 PM.  Advised patient to reduce to half tablet.  She could also take half tablet in the morning and half tablet in the evening.  Patient reports she wants to try half tablet at night and go from there.  She will reach out with further concerns as needed.

## 2020-02-28 ENCOUNTER — Other Ambulatory Visit (HOSPITAL_COMMUNITY): Payer: Medicare HMO | Admitting: Psychiatry

## 2020-02-28 ENCOUNTER — Other Ambulatory Visit: Payer: Self-pay

## 2020-02-28 ENCOUNTER — Telehealth (HOSPITAL_COMMUNITY): Payer: Self-pay | Admitting: Psychiatry

## 2020-02-28 NOTE — Telephone Encounter (Signed)
D:  Apparently, pt didn't attend virtual MH-IOP at all last week and didn't log on today.  A:  Placed call to pt, but there was no answer.  Left vm for pt to call case manager before the end of today to inform cm if she would like to continue in the program.  Inform treatment team.

## 2020-02-29 ENCOUNTER — Other Ambulatory Visit: Payer: Self-pay

## 2020-02-29 ENCOUNTER — Other Ambulatory Visit (HOSPITAL_COMMUNITY): Payer: Medicare HMO

## 2020-02-29 NOTE — Progress Notes (Signed)
Virtual Visit via Telephone Note  I connected with Stacy [name] on @TODAY @ at  9:00 AM EST by telephone and verified that I am speaking with the correct person using two identifiers.  Location: Patient: at home (no answer; left vm) Provider: at office   I discussed the limitations, risks, security and privacy concerns of performing an evaluation and management service by telephone and the availability of in person appointments. I also discussed with the patient that there may be a patient responsible charge related to this service. The patient expressed understanding and agreed to proceed.  I discussed the assessment and treatment plan with the patient. The patient was provided an opportunity to ask questions and all were answered. The patient agreed with the plan and demonstrated an understanding of the instructions.   The patient was advised to call back or seek an in-person evaluation if the symptoms worsen or if the condition fails to improve as anticipated.  I provided 5 minutes of non-face-to-face time during this encounter.   Patient ID: Stacy [name], female   DOB: May 11, 1960, 60 y.o.   MRN: [id number] As per previous CCA:  This is a 60 yr old, married, female who was referred per Dr. 41; treatment for worsening depressive and anxiety symptoms.  Denies SI/HI or A/V hallucinations.  According to pt, her sx's started to worsen two months ago.  "Dr. Elna Breslow had difficulty finding the right medication that wouldn't make me sick.  Pt reports no apparent stressor.  Pt denies any prior psychiatric admissions or suicide attempts/gestures.  States she's been seeing Dr Elna Breslow several yrs and [place], LCSW since [place], LCSW left.  Family hx:  Mother attempted suicide several times and sister (depression).  Medical hx:  cognitive impairment and Sjogren's Syndrome.  Current Symptoms/Problems: "I'm very emotional."  Sleep flucuates, sadness, anhedonia, no motivation, no energy,  feelings of hopelessness, poor concentration, anxious, decreased appetite   Patient only attended her first day and didn't return.  Attempted to reach pt the two days before Christmas but no answer.  Case manager went on vacation, but upon her return has been trying to reach pt but failed.  Group leaders state that pt had called one day last week and stated that she didn't receive the group links.  Once the group leader sent it to her again; day after day; pt never responded again nor did she log on.  A:  D/C pt due to non-compliancy with attendance.  Inform 09-18-1971, NP and Dr. Hillery Jacks.  Case manager was never in contact with pt's therapist because pt didn't give cm consent to speak with her.  Pt was requesting to have a new therapist.  Had encouraged pt to meet with current therapist at least one more time before making that decision.  Didn't discuss with patient further because pt never returned back to group.  Pt to f/u with Dr. Elna Breslow and current therapist or therapist of choice.

## 2020-03-01 ENCOUNTER — Other Ambulatory Visit (HOSPITAL_COMMUNITY): Payer: Medicare HMO

## 2020-03-01 ENCOUNTER — Other Ambulatory Visit: Payer: Self-pay

## 2020-03-02 ENCOUNTER — Ambulatory Visit (HOSPITAL_COMMUNITY): Payer: Medicare HMO

## 2020-03-03 ENCOUNTER — Ambulatory Visit (HOSPITAL_COMMUNITY): Payer: Medicare HMO

## 2020-03-05 ENCOUNTER — Other Ambulatory Visit: Payer: Self-pay | Admitting: Psychiatry

## 2020-03-05 DIAGNOSIS — Z79899 Other long term (current) drug therapy: Secondary | ICD-10-CM

## 2020-03-05 DIAGNOSIS — F3181 Bipolar II disorder: Secondary | ICD-10-CM

## 2020-03-06 ENCOUNTER — Telehealth: Payer: Medicare HMO | Admitting: Psychiatry

## 2020-03-06 ENCOUNTER — Other Ambulatory Visit: Payer: Self-pay

## 2020-03-07 ENCOUNTER — Ambulatory Visit: Payer: Medicare HMO | Admitting: Licensed Clinical Social Worker

## 2020-03-08 ENCOUNTER — Other Ambulatory Visit: Payer: Self-pay

## 2020-03-08 ENCOUNTER — Ambulatory Visit (INDEPENDENT_AMBULATORY_CARE_PROVIDER_SITE_OTHER): Payer: Medicare HMO | Admitting: Licensed Clinical Social Worker

## 2020-03-08 DIAGNOSIS — F411 Generalized anxiety disorder: Secondary | ICD-10-CM | POA: Diagnosis not present

## 2020-03-08 DIAGNOSIS — F3181 Bipolar II disorder: Secondary | ICD-10-CM | POA: Diagnosis not present

## 2020-03-08 NOTE — Progress Notes (Signed)
Virtual Visit via Video Note  I connected with [name] on 03/08/20 at  9:00 AM EST by a video enabled telemedicine application and verified that I am speaking with the correct person using two identifiers.  Location: Patient: home Provider: remote office [place], [place])   I discussed the limitations of evaluation and management by telemedicine and the availability of in person appointments. The patient expressed understanding and agreed to proceed.  The patient was advised to call back or seek an in-person evaluation if the symptoms worsen or if the condition fails to improve as anticipated.  I provided 30 minutes of non-face-to-face time during this encounter.   Adelyn Roscher R Thoren Hosang, LCSW    THERAPIST PROGRESS NOTE  Session Time: 9-9:30a  Participation Level: Active  Behavioral Response: Neat and Well GroomedAlertDepressed  Type of Therapy: Individual Therapy  Treatment Goals addressed: Coping  Interventions: CBT, DBT and Solution Focused  Summary: Stacy [name] is a 60 y.o. female who presents with continuing symptoms related to bipolar disorder diagnosis and generalized anxiety. Pt reporting more depression symptoms today: lack of initiative, lack of motivation, sadness/low mood. Pt reports that she tried one of the group sessions through [facility] but was not interested in continuing.  Allowed pt space to explore and express thoughts and feelings about therapy and discussed progress and setbacks so far in treatment. Allowed pt to do a good amount of self reflection and developing personal goals for emotional well being. Pt is on board to make changes in her daily life that could possibly help manage overall symptoms. [name] was more engaged in session today than she has been in months. Encouraged self care and life balance.  Will send psychoeducational materials to pt via email.    Suicidal/Homicidal: No  Therapist Response: [name] is  demonstrating higher levels of motivation towards making changes that will aid in improvements in her overall emotional well-being. Pts symptom intensity same as last session but pt has increased motivation for change, which is reflective of progress.   Plan: Return again in 3 weeks.  Diagnosis: Axis I: Bipolar, Depressed and Generalized Anxiety Disorder    Axis II: No diagnosis    [name], LCSW 03/08/2020

## 2020-03-29 ENCOUNTER — Ambulatory Visit: Payer: Medicare HMO | Admitting: Licensed Clinical Social Worker

## 2020-03-30 ENCOUNTER — Telehealth (INDEPENDENT_AMBULATORY_CARE_PROVIDER_SITE_OTHER): Payer: Medicare HMO | Admitting: Psychiatry

## 2020-03-30 ENCOUNTER — Encounter: Payer: Self-pay | Admitting: Psychiatry

## 2020-03-30 ENCOUNTER — Other Ambulatory Visit: Payer: Self-pay

## 2020-03-30 DIAGNOSIS — M79643 Pain in unspecified hand: Secondary | ICD-10-CM | POA: Insufficient documentation

## 2020-03-30 DIAGNOSIS — F3181 Bipolar II disorder: Secondary | ICD-10-CM

## 2020-03-30 DIAGNOSIS — F411 Generalized anxiety disorder: Secondary | ICD-10-CM

## 2020-03-30 DIAGNOSIS — F02A18 Dementia in other diseases classified elsewhere, mild, with other behavioral disturbance: Secondary | ICD-10-CM

## 2020-03-30 DIAGNOSIS — F0281 Dementia in other diseases classified elsewhere with behavioral disturbance: Secondary | ICD-10-CM | POA: Diagnosis not present

## 2020-03-30 DIAGNOSIS — M8580 Other specified disorders of bone density and structure, unspecified site: Secondary | ICD-10-CM | POA: Insufficient documentation

## 2020-03-30 DIAGNOSIS — M1990 Unspecified osteoarthritis, unspecified site: Secondary | ICD-10-CM | POA: Insufficient documentation

## 2020-03-30 DIAGNOSIS — M25561 Pain in right knee: Secondary | ICD-10-CM | POA: Insufficient documentation

## 2020-03-30 MED ORDER — CLONAZEPAM 0.5 MG PO TABS: 0.5000 mg | ORAL_TABLET | Freq: Every day | ORAL | 2 refills | Status: DC | PRN

## 2020-03-30 MED ORDER — BUSPIRONE HCL 10 MG PO TABS: 15.0000 mg | ORAL_TABLET | Freq: Two times a day (BID) | ORAL | 1 refills | Status: DC

## 2020-03-30 NOTE — Progress Notes (Signed)
Virtual Visit via Video Note  I connected with Stacy [name] on 03/30/20 at  2:00 PM EST by a video enabled telemedicine application and verified that I am speaking with the correct person using two identifiers.  Location Provider Location : [facility] Patient Location : Home  Participants: Patient , Provider    I discussed the limitations of evaluation and management by telemedicine and the availability of in person appointments. The patient expressed understanding and agreed to proceed.   I discussed the assessment and treatment plan with the patient. The patient was provided an opportunity to ask questions and all were answered. The patient agreed with the plan and demonstrated an understanding of the instructions.   The patient was advised to call back or seek an in-person evaluation if the symptoms worsen or if the condition fails to improve as anticipated.   [facility] MD OP Progress Note  03/30/2020 2:26 PM Stacy [name]  MRN:  [id number]  Chief Complaint:  Chief Complaint    Follow-up     HPI: Stacy [name] is a 59 year old Caucasian female, unemployed, married, lives in [place], has a history of bipolar disorder, GAD, major neurocognitive disorder, Sjogren's syndrome, hypertension, hypothyroidism, arthritis, migraine headaches was evaluated by telemedicine today.  Patient today reports she is currently not taking the Prolixin.  She reports it made her sluggish and hence she had to stop taking it.  She stopped it 2 weeks ago and ever since that she feels more energetic during the day and is able to function better.  She reports she currently does not have any mood swings or crying spells.  She reports sleep as improved.  She denies any hallucinations.  She does not have any paranoia and did not appear to be preoccupied with any delusions in session today.  Patient does report anxiety symptoms on and off.  She reports the BuSpar as beneficial.  She denies side effects.  She  reports she has been going out and spending time with friends.  She does have anxiety when she is in social situation however she has been coping better.  Patient appeared to be alert and oriented in session today, did not appear to have any memory problems, was able to answer questions appropriately.  Patient denies any other concerns today.  Visit Diagnosis:    ICD-10-CM   1. Bipolar 2 disorder (HCC)  F31.81    mixed, mild in remission  2. GAD (generalized anxiety disorder)  F41.1 busPIRone (BUSPAR) 10 MG tablet  3. 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  Past trials of Paxil, Wellbutrin, Latuda, Lamictal, Seroquel, Depakote, Belsomra, Zyprexa, Abilify, Vraylar  Past Medical History:  Past Medical History:  Diagnosis Date  . Aneurysm ([place])   . Anxiety   . Arthritis   . Brain stem lesion   . Chronic kidney disease    kideny stone  . Depression   . Fibromyalgia   . GERD (gastroesophageal reflux disease)   . Headache   . Hypotension   . Hypothyroid   . Memory loss   . Migraine   . Raynaud disease   . Sjogren's syndrome ([place])   . Thyroid disease     Past Surgical History:  Procedure Laterality Date  . ABDOMINAL HYSTERECTOMY    . BASAL CELL CARCINOMA EXCISION    . BREAST BIOPSY    . CESAREAN  SECTION    . EYE SURGERY    . KNEE SURGERY Left   . LITHOTRIPSY    . MUSCLE BIOPSY    . OOPHORECTOMY      Family Psychiatric History: I have reviewed family psychiatric history from my progress note on 12/10/2017  Family History:  Family History  Problem Relation Age of Onset  . Migraines Mother   . Anxiety disorder Mother   . Depression Mother   . Cancer Father   . Hypertension Father   . Stroke Father   . Heart attack Father   . Alcohol abuse Father   . Diabetes Brother   . Alcohol abuse Sister    . Drug abuse Sister   . Anxiety disorder Sister   . Depression Sister   . Drug abuse Brother   . Cancer Paternal Aunt   . Lupus Paternal Aunt   . Thyroid disease Paternal Aunt   . Diabetes Maternal Grandfather   . Anxiety disorder Maternal Grandmother   . Depression Maternal Grandmother     Social History: I have reviewed social history from my progress note on 12/10/2017 Social History   Socioeconomic History  . Marital status: Married    Spouse name: [name]  . Number of children: 2  . Years of education: 28  . Highest education level: Associate degree: occupational, Hotel manager, or vocational program  Occupational History  . Occupation: Disabled  Tobacco Use  . Smoking status: Never Smoker  . Smokeless tobacco: Never Used  Vaping Use  . Vaping Use: Never used  Substance and Sexual Activity  . Alcohol use: No    Alcohol/week: 0.0 standard drinks  . Drug use: Never  . Sexual activity: Yes    Birth control/protection: None  Other Topics Concern  . Not on file  Social History Narrative   Lives at home with husband. [name]).    Disabled.   Education college.   Right handed.   2 cups caffeine/daily   Social Determinants of Health   Financial Resource Strain: Not on file  Food Insecurity: Not on file  Transportation Needs: Not on file  Physical Activity: Not on file  Stress: Not on file  Social Connections: Not on file    Allergies:  Allergies  Allergen Reactions  . Penicillins Hives  . Amoxicillin Hives  . Depakote [Divalproex Sodium]     vomiting  . Lamictal [Lamotrigine]     Side effects of abdominal cramps, nausea , diarrhea ,nightmares    Metabolic Disorder Labs: Lab Results  Component Value Date   HGBA1C 5.0 11/13/2015   No results found for: PROLACTIN No results found for: CHOL, TRIG, HDL, CHOLHDL, VLDL, LDLCALC Lab Results  Component Value Date   TSH 1.780 10/26/2015   TSH 0.525 03/15/2015    Therapeutic Level Labs: No results found  for: LITHIUM Lab Results  Component Value Date   VALPROATE 10 (L) 07/28/2019   No components found for:  CBMZ  Current Medications: Current Outpatient Medications  Medication Sig Dispense Refill  . Ascorbic Acid (VITAMIN C) 100 MG tablet 1 tablet    . busPIRone (BUSPAR) 10 MG tablet Take 1.5 tablets (15 mg total) by mouth 2 (two) times daily. 180 tablet 1  . cevimeline (EVOXAC) 30 MG capsule 1 capsule    . Cholecalciferol (VITAMIN D-3) 1000 units CAPS Take by mouth daily.    . clonazePAM (KLONOPIN) 0.5 MG tablet Take 1 tablet (0.5 mg total) by mouth daily as needed for anxiety. AS NEEDED FOR SEVERE  ANXIETY ATTACKS 15 tablet 0  . donepezil (ARICEPT) 10 MG tablet Take 10 mg by mouth every morning.    . gabapentin (NEURONTIN) 300 MG capsule Take 1 capsule by mouth two times daily 180 capsule 2  . hydroxychloroquine (PLAQUENIL) 200 MG tablet     . hydrOXYzine (VISTARIL) 25 MG capsule Take 1 capsule (25 mg total) by mouth daily as needed (for severe anxiety). Anxiety/agitation- please limit use 90 capsule 0  . levothyroxine (SYNTHROID, LEVOTHROID) 175 MCG tablet Take 1 tablet (175 mcg total) by mouth daily before breakfast. 90 tablet 3  . loperamide (IMODIUM A-D) 2 MG tablet Take 1 tablet (2 mg total) by mouth 4 (four) times daily as needed for diarrhea or loose stools. 30 tablet 0  . meloxicam (MOBIC) 15 MG tablet Take 15 mg by mouth 3 (three) times daily.    . Multiple Vitamin (MULTI-VITAMIN) tablet Take 1 tablet by mouth daily.    . Multiple Vitamins-Minerals (PRESERVISION AREDS) CAPS See admin instructions.    Marland Kitchen omeprazole (PRILOSEC) 20 MG capsule Take 1 capsule (20 mg total) by mouth daily. 180 capsule 3  . omeprazole (PRILOSEC) 40 MG capsule Take 40 mg by mouth daily.    . predniSONE (DELTASONE) 5 MG tablet     . SUMAtriptan (IMITREX) 100 MG tablet     . traMADol (ULTRAM) 50 MG tablet     . vitamin E 45 MG (100 UNITS) capsule 1 capsule    . zonisamide (ZONEGRAN) 50 MG capsule Take 1  tab qhs for 1 week then 2 caps for total of 100 mg.     No current facility-administered medications for this visit.     Musculoskeletal: Strength & Muscle Tone: UTA Gait & Station: UTA Patient leans: N/A  Psychiatric Specialty Exam: Review of Systems  Psychiatric/Behavioral: The patient is nervous/anxious.   All other systems reviewed and are negative.   There were no vitals taken for this visit.There is no height or weight on file to calculate BMI.  General Appearance: Casual  Eye Contact:  Fair  Speech:  Clear and Coherent  Volume:  Normal  Mood:  Anxious  Affect:  Congruent  Thought Process:  Goal Directed and Descriptions of Associations: Intact  Orientation:  Full (Time, Place, and Person)  Thought Content: Logical   Suicidal Thoughts:  No  Homicidal Thoughts:  No  Memory:  Immediate;   Fair Recent;   Fair Remote;   Fair  Judgement:  Fair  Insight:  Fair  Psychomotor Activity:  Normal  Concentration:  Concentration: Fair and Attention Span: Fair  Recall:  [employer] of Knowledge: Fair  Language: Fair  Akathisia:  No  Handed:  Right  AIMS (if indicated): UTA  Assets:  Communication Skills Desire for Improvement Housing Social Support  ADL's:  Intact  Cognition: WNL  Sleep:  Fair   Screenings: [place] Office Visit from 06/15/2018 in [facility] Office Visit from 05/20/2018 in [facility] Total Score 5 9    GAD-7   Flowsheet Row Counselor from 11/11/2019 in [facility]  Total GAD-7 Score 18    [place] Visit from 01/01/2016 in [facility] Office Visit from 11/13/2015 in [facility] Office Visit from 10/26/2015 in [facility] Office Visit from 04/25/2015 in [facility] Office Visit from 10/25/2014 in [facility]  Total Score (max 30 points  ) 25 26 21  [place] ED from 02/17/2020 in [facility] Counselor from 11/11/2019 in [place] Office Visit from 02/08/2015 in [facility] Office Visit from 10/07/2014 in [facility]  PHQ-2 Total Score 6 6 0 4  PHQ-9 Total Score 18 21 -- 8       Assessment and Plan: Stacy [name] is a 60 year old Caucasian female who has a history of bipolar disorder type II, hypothyroidism, cognitive disorder, insomnia, Sjogren's syndrome, fibromyalgia was evaluated by telemedicine today.  Patient with psychosocial stressors of her own health issues, current pandemic.  Patient had adverse side effects to Prolixin however currently is making progress with regards to her mood swings although continues to need medication management for anxiety.  Plan as noted below.  Plan Bipolar disorder type II-in remission Discontinue Prolixin for side effects. Continue BuSpar as prescribed  GAD-improving Increase BuSpar to 15 mg p.o. twice daily.  Patient will monitor herself closely since she is sensitive to medications in general.  If she has any adverse side effects she will reach out to Probation officer. Continue Klonopin 0.5 mg as needed for severe anxiety attacks.  She has been limiting use and has been using it only once every week or so.  Insomnia-stable Will monitor closely  Neurocognitive disorder with behavioral changes-stable She will follow-up with neurology. Continue Aricept.  GeneSight testing results were reviewed while making medication changes  Follow-up in clinic in 4 weeks or sooner if needed.  I have spent atleast 20 minutes face to face by video with patient today. More than 50 % of the time was spent for preparing to see the patient ( e.g., review of test, records ), ordering medications and test ,psychoeducation and supportive psychotherapy and care coordination,as well as  documenting clinical information in electronic health record. This note was generated in part or whole with voice recognition software. Voice recognition is usually quite accurate but there are transcription errors that can and very often do occur. I apologize for any typographical errors that were not detected and corrected.        Ursula Alert, MD 03/30/2020, 2:26 PM

## 2020-03-30 NOTE — Addendum Note (Signed)
Addended byUrsula Alert on: 03/30/2020 02:32 PM   Modules accepted: Orders

## 2020-04-04 ENCOUNTER — Other Ambulatory Visit: Payer: Self-pay | Admitting: Psychiatry

## 2020-04-04 DIAGNOSIS — F3181 Bipolar II disorder: Secondary | ICD-10-CM

## 2020-04-04 DIAGNOSIS — Z79899 Other long term (current) drug therapy: Secondary | ICD-10-CM

## 2020-04-10 ENCOUNTER — Other Ambulatory Visit: Payer: Self-pay

## 2020-04-10 ENCOUNTER — Ambulatory Visit: Payer: Medicare HMO | Admitting: Licensed Clinical Social Worker

## 2020-04-28 ENCOUNTER — Telehealth: Payer: Medicare HMO | Admitting: Psychiatry

## 2020-05-17 ENCOUNTER — Other Ambulatory Visit: Payer: Self-pay

## 2020-05-17 ENCOUNTER — Encounter: Payer: Self-pay | Admitting: Psychiatry

## 2020-05-17 ENCOUNTER — Telehealth (INDEPENDENT_AMBULATORY_CARE_PROVIDER_SITE_OTHER): Payer: Medicare HMO | Admitting: Psychiatry

## 2020-05-17 ENCOUNTER — Other Ambulatory Visit: Payer: Self-pay | Admitting: Psychiatry

## 2020-05-17 DIAGNOSIS — F0281 Dementia in other diseases classified elsewhere with behavioral disturbance: Secondary | ICD-10-CM | POA: Diagnosis not present

## 2020-05-17 DIAGNOSIS — F3181 Bipolar II disorder: Secondary | ICD-10-CM | POA: Diagnosis not present

## 2020-05-17 DIAGNOSIS — F411 Generalized anxiety disorder: Secondary | ICD-10-CM

## 2020-05-17 DIAGNOSIS — F02A18 Dementia in other diseases classified elsewhere, mild, with other behavioral disturbance: Secondary | ICD-10-CM

## 2020-05-17 DIAGNOSIS — F5105 Insomnia due to other mental disorder: Secondary | ICD-10-CM

## 2020-05-17 MED ORDER — BUSPIRONE HCL 15 MG PO TABS: 15.0000 mg | ORAL_TABLET | Freq: Two times a day (BID) | ORAL | 0 refills | Status: DC

## 2020-05-17 NOTE — Progress Notes (Signed)
**Stacy [name] De-Identified via Obfuscation** Virtual Visit via Video Stacy [name]  I connected with Stacy Stacy [name] on 05/17/20 at  3:00 PM EDT by a video enabled telemedicine application and verified that I am speaking with the correct person using two identifiers.  Location Provider Location : [facility] Patient Location : Home  Participants: Patient , Provider   I discussed the limitations of evaluation and management by telemedicine and the availability of in person appointments. The patient expressed understanding and agreed to proceed.   I discussed the assessment and treatment plan with the patient. The patient was provided an opportunity to ask questions and all were answered. The patient agreed with the plan and demonstrated an understanding of the instructions.   The patient was advised to call back or seek an in-person evaluation if the symptoms worsen or if the condition fails to improve as anticipated.  Stacy Stacy [name]  05/17/2020 3:20 PM Stacy Stacy [name]  MRN:  [id number]  Chief Complaint:  Chief Complaint    Follow-up; Anxiety; Depression; Hallucinations     HPI: Stacy Stacy [name] is a 60 year old Caucasian female, unemployed, married, lives in [place], has a history of bipolar disorder, GAD, major neurocognitive disorder, Sjogren's syndrome, hypertension, hypothyroidism, arthritis, migraine headaches was evaluated by telemedicine today.  Patient today reports she has noticed mild depressive symptoms recently.  She felt like she wanted to cry however did not.  She reports she does not want to go back there and is interested in medication changes.  She reports her anxiety symptoms as better.  She reports sleep is overall okay.  Patient denies any suicidality, homicidality or perceptual disturbances.  Patient reports in spite of changing the BuSpar dosage last visit she did not go up on the dose.  She is interested in doing it today.  She reports she goes out for walks with her husband every day.  She has been  watching her diet.  She denies any suicidality, homicidality or perceptual disturbances.  She has not had any significant memory problems or episodes of confusion recently.  She denies any other concerns today.  Visit Diagnosis:    ICD-10-CM   1. Bipolar 2 disorder (HCC)  F31.81    mixed, mild in remission  2. GAD (generalized anxiety disorder)  F41.1 busPIRone (BUSPAR) 15 MG tablet  3. 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  Past trials of Paxil, Wellbutrin, Latuda, Lamictal, Seroquel, Depakote, Belsomra, Zyprexa, Abilify, Vraylar  Past Medical History:  Past Medical History:  Diagnosis Date  . Aneurysm ([place])   . Anxiety   . Arthritis   . Brain stem lesion   . Chronic kidney disease    kideny stone  . Depression   . Fibromyalgia   . GERD (gastroesophageal reflux disease)   . Headache   . Hypotension   . Hypothyroid   . Memory loss   . Migraine   . Raynaud disease   . Sjogren's syndrome ([place])   . Thyroid disease     Past Surgical History:  Procedure Laterality Date  . ABDOMINAL HYSTERECTOMY    . BASAL CELL CARCINOMA EXCISION    . BREAST BIOPSY    . CESAREAN SECTION    . EYE SURGERY    . KNEE SURGERY Left   . LITHOTRIPSY    . MUSCLE BIOPSY    . [place]  History: I have reviewed family psychiatric history from my progress Stacy [name] on 12/10/2017.  Family History:  Family History  Problem Relation Age of Onset  . Migraines Mother   . Anxiety disorder Mother   . Depression Mother   . Cancer Father   . Hypertension Father   . Stroke Father   . Heart attack Father   . Alcohol abuse Father   . Diabetes Brother   . Alcohol abuse Sister   . Drug abuse Sister   . Anxiety disorder Sister   . Depression Sister   . Drug abuse Brother   . Cancer Paternal Aunt   .  Lupus Paternal Aunt   . Thyroid disease Paternal Aunt   . Diabetes Maternal Grandfather   . Anxiety disorder Maternal Grandmother   . Depression Maternal Grandmother     Social History: I have reviewed social history from my progress Stacy [name] on 12/10/2017. Social History   Socioeconomic History  . Marital status: Married    Spouse name: Stacy Stacy [name]  . Number of children: 2  . Years of education: 56  . Highest education level: Associate degree: occupational, Hotel manager, or vocational program  Occupational History  . Occupation: Disabled  Tobacco Use  . Smoking status: Never Smoker  . Smokeless tobacco: Never Used  Vaping Use  . Vaping Use: Never used  Substance and Sexual Activity  . Alcohol use: No    Alcohol/week: 0.0 standard drinks  . Drug use: Never  . Sexual activity: Yes    Birth control/protection: None  Other Topics Concern  . Not on file  Social History Narrative   Lives at home with husband. Stacy Stacy [name]).    Disabled.   Education college.   Right handed.   2 cups caffeine/daily   Social Determinants of Health   Financial Resource Strain: Not on file  Food Insecurity: Not on file  Transportation Needs: Not on file  Physical Activity: Not on file  Stress: Not on file  Social Connections: Not on file    Allergies:  Allergies  Allergen Reactions  . Penicillins Hives  . Amoxicillin Hives  . Depakote [Divalproex Sodium]     vomiting  . Lamictal [Lamotrigine]     Side effects of abdominal cramps, nausea , diarrhea ,nightmares    Metabolic Disorder Labs: Lab Results  Component Value Date   HGBA1C 5.0 11/13/2015   No results found for: PROLACTIN No results found for: CHOL, TRIG, HDL, CHOLHDL, VLDL, LDLCALC Lab Results  Component Value Date   TSH 1.780 10/26/2015   TSH 0.525 03/15/2015    Therapeutic Level Labs: No results found for: LITHIUM Lab Results  Component Value Date   VALPROATE 10 (L) 07/28/2019   No components found for:  CBMZ  Current  Medications: Current Outpatient Medications  Medication Sig Dispense Refill  . busPIRone (BUSPAR) 15 MG tablet Take 1 tablet (15 mg total) by mouth 2 (two) times daily. 180 tablet 0  . Ascorbic Acid (VITAMIN C) 100 MG tablet 1 tablet    . cevimeline (EVOXAC) 30 MG capsule 1 capsule    . Cholecalciferol (VITAMIN D-3) 1000 units CAPS Take by mouth daily.    . clonazePAM (KLONOPIN) 0.5 MG tablet Take 1 tablet (0.5 mg total) by mouth daily as needed for anxiety. AS NEEDED FOR SEVERE ANXIETY ATTACKS 15 tablet 2  . donepezil (ARICEPT) 10 MG tablet Take 10 mg by mouth every morning.    . gabapentin (NEURONTIN) 300 MG capsule Take 1 capsule by mouth two times daily  180 capsule 2  . hydroxychloroquine (PLAQUENIL) 200 MG tablet     . hydrOXYzine (VISTARIL) 25 MG capsule Take 1 capsule (25 mg total) by mouth daily as needed (for severe anxiety). Anxiety/agitation- please limit use 90 capsule 0  . levothyroxine (SYNTHROID, LEVOTHROID) 175 MCG tablet Take 1 tablet (175 mcg total) by mouth daily before breakfast. 90 tablet 3  . loperamide (IMODIUM A-D) 2 MG tablet Take 1 tablet (2 mg total) by mouth 4 (four) times daily as needed for diarrhea or loose stools. 30 tablet 0  . meloxicam (MOBIC) 15 MG tablet Take 15 mg by mouth 3 (three) times daily.    . Multiple Vitamin (MULTI-VITAMIN) tablet Take 1 tablet by mouth daily.    . Multiple Vitamins-Minerals (PRESERVISION AREDS) CAPS See admin instructions.    Marland Kitchen omeprazole (PRILOSEC) 20 MG capsule Take 1 capsule (20 mg total) by mouth daily. 180 capsule 3  . omeprazole (PRILOSEC) 40 MG capsule Take 40 mg by mouth daily.    . predniSONE (DELTASONE) 5 MG tablet     . Rimegepant Sulfate 75 MG TBDP Take by mouth.    . SUMAtriptan (IMITREX) 100 MG tablet     . traMADol (ULTRAM) 50 MG tablet     . vitamin E 45 MG (100 UNITS) capsule 1 capsule    . zonisamide (ZONEGRAN) 50 MG capsule Take 1 tab qhs for 1 week then 2 caps for total of 100 mg.     No current  facility-administered medications for this visit.     Musculoskeletal: Strength & Muscle Tone: UTA Gait & Station: UTA Patient leans: N/A  Psychiatric Specialty Exam: Review of Systems  Psychiatric/Behavioral: Positive for dysphoric mood. The patient is nervous/anxious.   All other systems reviewed and are negative.   There were no vitals taken for this visit.There is no height or weight on file to calculate BMI.  General Appearance: Casual  Eye Contact:  Fair  Speech:  Clear and Coherent  Volume:  Normal  Mood:  Anxious and Dysphoric  Affect:  Appropriate  Thought Process:  Goal Directed and Descriptions of Associations: Intact  Orientation:  Full (Time, Place, and Person)  Thought Content: Logical   Suicidal Thoughts:  No  Homicidal Thoughts:  No  Memory:  Immediate;   Fair Recent;   Fair Remote;   Fair  Judgement:  Fair  Insight:  Fair  Psychomotor Activity:  Normal  Concentration:  Concentration: Fair and Attention Span: Fair  Recall:  [employer] of Knowledge: Fair  Language: Fair  Akathisia:  No  Handed:  Right  AIMS (if indicated): not done  Assets:  Communication Skills Desire for Improvement Housing Social Support  ADL's:  Intact  Cognition: WNL  Sleep:  Fair   Screenings: [place] Office Visit from 06/15/2018 in [place] Office Visit from 05/20/2018 in [place] Total Score 5 9    GAD-7   Flowsheet Row Counselor from 11/11/2019 in [place]  Total GAD-7 Score 18    [place] Visit from 01/01/2016 in [facility] Office Visit from 11/13/2015 in [facility] Office Visit from 10/26/2015 in [facility] Office Visit from 04/25/2015 in [facility] Office Visit from 10/25/2014 in [facility]  Total Score (max 30 points ) 25 26 21 23 29      PHQ2-9   [place] ED from 02/17/2020 in [facility]  Health Center Counselor from 11/11/2019 in [place] Office Visit from 02/08/2015 in [facility] Office Visit from 10/07/2014 in [facility]  PHQ-2 Total Score 6 6 0 4  PHQ-9 Total Score 18 21 -- 8    [place] ED from 02/17/2020 in [place] No Risk       Assessment and Plan: Shondrea Steinert is a 60 year old Caucasian female, has a history of bipolar disorder type II, hypothyroidism, cognitive disorder, insomnia, Sjogren's syndrome, fibromyalgia was evaluated by telemedicine today.  Patient with psychosocial stressors of health issues, current pandemic.  Patient is currently reporting worsening mood symptoms and will benefit from the following plan.  Plan Bipolar disorder type II in remission Continue gabapentin-prescribed for fibromyalgia.  Discussed with her that it is a mood stabilizer.  Could consider increasing the dosage as needed.  GAD-unstable Increase BuSpar to 15 mg p.o. twice daily  Neurocognitive disorder with behavioral changes-stable She will continue to follow-up with neurology Continue Aricept  [employer] testing results were reviewed while making medication changes  Follow-up in clinic in 4 weeks or sooner if needed.  This Stacy [name] was generated in part or whole with voice recognition software. Voice recognition is usually quite accurate but there are transcription errors that can and very often do occur. I apologize for any typographical errors that were not detected and corrected.       Ursula Alert, MD 05/18/2020, 8:12 AM

## 2020-06-14 ENCOUNTER — Other Ambulatory Visit: Payer: Self-pay

## 2020-06-14 ENCOUNTER — Telehealth (INDEPENDENT_AMBULATORY_CARE_PROVIDER_SITE_OTHER): Payer: Medicare HMO | Admitting: Psychiatry

## 2020-06-14 ENCOUNTER — Encounter: Payer: Self-pay | Admitting: Psychiatry

## 2020-06-14 DIAGNOSIS — F0281 Dementia in other diseases classified elsewhere with behavioral disturbance: Secondary | ICD-10-CM

## 2020-06-14 DIAGNOSIS — F3181 Bipolar II disorder: Secondary | ICD-10-CM

## 2020-06-14 DIAGNOSIS — F02A18 Dementia in other diseases classified elsewhere, mild, with other behavioral disturbance: Secondary | ICD-10-CM

## 2020-06-14 DIAGNOSIS — F411 Generalized anxiety disorder: Secondary | ICD-10-CM | POA: Diagnosis not present
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
## 2020-07-12 ENCOUNTER — Encounter: Payer: Self-pay | Admitting: Psychology

## 2020-07-25 ENCOUNTER — Encounter: Payer: Self-pay | Admitting: Psychology

## 2020-07-25 ENCOUNTER — Encounter: Payer: Medicare HMO | Attending: Psychology | Admitting: Psychology

## 2020-07-25 ENCOUNTER — Other Ambulatory Visit: Payer: Self-pay

## 2020-07-25 DIAGNOSIS — F0281 Dementia in other diseases classified elsewhere with behavioral disturbance: Secondary | ICD-10-CM | POA: Diagnosis not present

## 2020-07-25 DIAGNOSIS — F411 Generalized anxiety disorder: Secondary | ICD-10-CM | POA: Diagnosis present

## 2020-07-25 DIAGNOSIS — F3181 Bipolar II disorder: Secondary | ICD-10-CM

## 2020-07-25 DIAGNOSIS — F02A18 Dementia in other diseases classified elsewhere, mild, with other behavioral disturbance: Secondary | ICD-10-CM

## 2020-07-25 NOTE — Progress Notes (Signed)
NEUROBEHAVIORAL STATUS EXAM   Name: Stacy [name] Date of Birth: [birth year] Date of Interview: 07/25/2020  Reason for Referral:  Stacy [name] is a 60 y.o. female who is referred for neuropsychological evaluation by Dr. Shea Evans of [facility] due to cognitive concerns. This patient is unaccompanied in the office and self-reports the history.  History of Presenting Problem:  Stacy [name] is a 60 year old Caucasian female, unemployed, married, lives in [place], has a history of bipolar disorder, GAD, major neurocognitive disorder, Sjogren's syndrome, hypertension, hypothyroidism, arthritis, and migraine headaches was evaluated in-person today for neuropsychological consultation.  The following portions of the patient's history were reviewed and updated as appropriate: allergies, current medications, past family history, past medical history, past social history, past surgical history and problem list.  Onset/Course: 10 years ago. Insidious onset with gradual worsening.   Upon direct questioning, the patient reported:   Forgetting recent conversations/events: Yes worse over the last 10 years.  Repeating statements/questions: Yes worse over the last 10 years.  Misplacing/losing items: Stacy [name], personal belongings, household items. Tries to put things in same place with husbands assistance but still has problems.  Forgetting appointments or other obligations: Uses/checks calendar daily. Able to manage using this strategy.  Forgetting to take medications: Endorsed difficulty remembering to take them regularly. Uses alarm clock to help but will forget if side tracked.   Difficulty concentrating: Gradual worsening in the last 10 years.  Starting but not finishing tasks: Started 10 years ago but worse in the last year  Distracted easily: Gradual worsening in the last 10 years.  Processing information more slowly: Gradual worsening in the last 10 years.    Word-finding difficulty: Minimal trouble  Word substitutions: On occasion.  Writing difficulty: Penmanship has gotten worse. Organization of writing has declined. Not able to read own notes.  Spelling difficulty: Yes, change  Comprehension difficulty: Has to re-read multiple times. Generally able to comprehend what she reads and hears.  Mathematics: Change in the last   Getting lost when driving: On occasion due to inattention.  Making wrong turns when driving: Yes. More in the past year.  Uncertain about directions when driving or passenger: Typically don't pay attention as passenger.   Family neuro hx: None reported.  Any family hx dementia? None reported.   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   Complex ADLs Driving: Able to drive but does become distracted more easily. Admits to wandering off on occasion. Uses GPS to prevent getting lost.  Medication management: Management of finances: Appointments:  Cooking: Able to cook but choose not to. Had trouble following directions 6-7 months ago. Has "given up on cooking". Used to make dinner before. Husband cooks  Medical/Physical complaints:  Any hx of stroke/TIA, MI, LOC/TBI, Sz? Denies  Hx falls? Denies  Balance, probs walking? Denies  Sleep: Insomnia? OSA? CPAP? REM sleep beh sx? Typically gets 6-7 hours per night. Wake up several times. Feel restless. No previous sleep study.  Go to bedroom around 8:00PM to watch TV for around 1-2 hours. Eventually drift off while TV is on. Wake up to turn TV off. Able to get back to sleep relatively quick but then wake up after a few hours.  Visual illusions/hallucinations? VH onset 1 year ago (i.e., shadow people). Started happening at night when looking out doorway in house. Then moved to where it was perceived in the bedroom. Had  a couple times when perceived  shadow people were charging towards her face. Typically feels scared when having these distortions. Close eyes and reassure self that "its not there"; afraid to look. Has reportedly experienced this around 12 x in last year but not in the last 2 months.   Appetite/Nutrition/Weight changes: Limiting diet to preferred foods in the last  6 months.    Current mood: "ok",  "average", "not sad". Last time happy- few days ago when participating in motorcycle rally with husband.      Behavioral disturbance/Personality change: Denies   Suicidal Ideation/Intention: Denies   Review of Systems  Constitutional: Positive for activity change and fatigue. Negative for appetite change, chills, fever and unexpected weight change.  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  Neurological: Positive for light-headedness (around 1 x week) and headaches (Migraines weekly ). Negative for dizziness, tremors, seizures, syncope, facial asymmetry, speech difficulty, weakness and numbness.  Psychiatric/Behavioral: Positive for agitation, confusion (Weekly), decreased concentration, dysphoric mood, hallucinations (VA) and sleep disturbance. Negative for self-injury and suicidal ideas. The patient is nervous/anxious. The patient is not hyperactive.     Psychiatric History: History of depression, anxiety, other MH disorder: Patient reports a history of depression and possible hypomanic episodes.  She however was never diagnosed with bipolar disorder in the past.  She reports a diagnosis of generalized anxiety disorder in the past as well as cognitive impairment.  She was under psychotherapy treatment in the past.  Has days where she cries uncontrollably. Started around 2 years ago. Starts feeling sad out of the blue lasts a 2-3 days then resolves. Ok then for like 4-5 days. Cant sit and relax "I got to do something". Restless.  Gets tired. Denies decreased need for sleep.  History of  MH treatment: Past trials of Paxil, Wellbutrin, Latuda, Lamictal, Seroquel, Depakote, Belsomra, Zyprexa, Abilify, Vraylar History of SI: Denies  History of substance: dependence/treatment: Denies  Associated Signs/Symptoms: Depression Symptoms:  depressed mood, insomnia, difficulty concentrating, anxiety, panic attacks, decreased appetite, (Hypo) Manic Symptoms:  Distractibility, Elevated Mood, Labiality of Mood, Anxiety Symptoms:  Panic Symptoms, Psychotic Symptoms:  denies PTSD Symptoms:  Social History: Socioeconomic History  . Marital status: Married    Spouse name: [name]  . Number of children: 2  . Years of education: 23  . Highest education level: Associate degree: occupational, Hotel manager, or vocational program  Occupational History  . Occupation: Disabled  Social Needs  . Financial resource strain: Not hard at all  . Food insecurity:    Worry: Never true    Inability: Never true  . Transportation needs:    Medical: No    Non-medical: No  Tobacco Use  . Smoking status: Never Smoker  . Smokeless tobacco: Never Used  Substance and Sexual Activity  . Alcohol use: No    Alcohol/week: 0.0 standard drinks  . Drug use: Never  . Sexual activity: Yes    Birth control/protection: None  Lifestyle  . Physical activity:    Days per week: 7 days    Minutes per session: 30 min  . Stress: Very much  Relationships  . Social connections:    Talks on phone: Not on file    Gets together: Not on file    Attends religious service: More than 4 times per year    Active member of club or organization: No    Attends meetings of clubs or organizations: Never    Relationship status: Married  Other Topics Concern  .  Not on file  Social History Narrative   Lives at home with husband. [name]).    Disabled.   Education college.   Right handed.   2 cups caffeine/daily   Additional Social History: Patient is married.  She lives with her  husband in [place].  She is currently on disability.  She has 2 adult sons and 4 grandchildren. Born/Raised: [place] until 24 yrs.  Education: Always struggled in school. Had to work hard just to get by. Received mostly C's. Favorite subject was history. Took supplementary reading comprehension course due to having some trouble in 7th grade. Dropped out of HS in 11th grade. 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 Supervisor made comments about not completing work in timely manner; missing work tasks.  Marital history: 1st marriage lasted 2.5 years and yielded 2 children (41 and 44). Granted full custody of children and raised them on her own; had support from her family. Has been married to 2nd husband for 33 years.  Children: 2  Alcohol: Denies  Tobacco: Denies  SA: Denies   Medical History: Past Medical History:  Diagnosis Date  . Aneurysm ([place])   . Anxiety   . Arthritis   . Brain stem lesion   . Chronic kidney disease    kideny stone  . Depression   . Fibromyalgia   . GERD (gastroesophageal reflux disease)   . Headache   . Hypotension   . Hypothyroid   . Memory loss   . Migraine   . Raynaud disease   . Sjogren's syndrome ([place])   . Thyroid disease    Imaging:  MRI of Brain on 08/12/2013 was unremarkable.   MRI of Brain on 07/27/2014 was abnormal demonstrating the following: 1. There is a 5x5x33mm midline tectal lesion (T2FLAIR hyperintense). No abnormal enhancement. 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 No change from MRI on 08/12/13. 2. No acute findings.  MRI of Brain on 12/19/2015 showed the following impressions: 1. 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  2. No other significant abnormalities.  MRI of Brain on 08/06/2016 showed the following impressions:  1. 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 This does not enhance. Differential diagnosis includes  demyelinating disease, chronic ischemia, and low grade neoplasm. 2. No other lesions identified and no change from prior studies.  MRI of Brain on 09/10/17 showed the following impressions: 1. Normal NeuroQuant study. No evidence of brain atrophy or degenerative change. 2. 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 Differential diagnosis is complicated cyst, hamartoma or low-grade tumor.  Current Medications:  Outpatient Encounter Medications as of 07/25/2020  Medication Sig  . Ascorbic Acid (VITAMIN C) 100 MG tablet 1 tablet  . busPIRone (BUSPAR) 15 MG tablet Take 1 tablet (15 mg total) by mouth 2 (two) times daily.  . cevimeline (EVOXAC) 30 MG capsule 1 capsule  . Cholecalciferol (VITAMIN D-3) 1000 units CAPS Take by mouth daily.  . clonazePAM (KLONOPIN) 0.5 MG tablet Take 1 tablet (0.5 mg total) by mouth daily as needed for anxiety. AS NEEDED FOR SEVERE ANXIETY ATTACKS  . donepezil (ARICEPT) 10 MG tablet Take 10 mg by mouth every morning.  . gabapentin (NEURONTIN) 300 MG capsule Take 1 capsule by mouth two times daily  . hydroxychloroquine (PLAQUENIL) 200 MG tablet   . hydrOXYzine (VISTARIL) 25 MG capsule Take 1 capsule (25 mg  total) by mouth daily as needed (for severe anxiety). Anxiety/agitation- please limit use  . levothyroxine (SYNTHROID, LEVOTHROID) 175 MCG tablet Take 1 tablet (175 mcg total) by mouth daily before breakfast.  . loperamide (IMODIUM A-D) 2 MG tablet Take 1 tablet (2 mg total) by mouth 4 (four) times daily as needed for diarrhea or loose stools.  . meloxicam (MOBIC) 15 MG tablet Take 15 mg by mouth 3 (three) times daily.  . Multiple Vitamin (MULTI-VITAMIN) tablet Take 1 tablet by mouth daily.  . Multiple Vitamins-Minerals (PRESERVISION AREDS) CAPS See admin instructions.  Marland Kitchen omeprazole (PRILOSEC) 20 MG capsule Take 1 capsule (20 mg total) by mouth daily.  Marland Kitchen omeprazole (PRILOSEC) 40 MG capsule Take 40 mg by mouth daily.  . predniSONE  (DELTASONE) 5 MG tablet   . Rimegepant Sulfate 75 MG TBDP Take by mouth.  . SUMAtriptan (IMITREX) 100 MG tablet   . traMADol (ULTRAM) 50 MG tablet   . vitamin E 45 MG (100 UNITS) capsule 1 capsule  . zonisamide (ZONEGRAN) 50 MG capsule Take 1 tab qhs for 1 week then 2 caps for total of 100 mg.   No facility-administered encounter medications on file as of 07/25/2020.   Family History:  Problem Relation Age of Onset  . Migraines Mother   . Anxiety disorder Mother   . Depression Mother   . Cancer Father   . Hypertension Father   . Stroke Father   . Heart attack Father   . Alcohol abuse Father   . Diabetes Brother   . Alcohol abuse Sister   . Drug abuse Sister   . Anxiety disorder Sister   . Depression Sister   . Drug abuse Brother   . Cancer Paternal Aunt   . Lupus Paternal Aunt   . Thyroid disease Paternal Aunt   . Diabetes Maternal Grandfather   . Anxiety disorder Maternal Grandmother   . Depression Maternal Grandmother     Family Psychiatric History: Patient reports her mother struggled with bipolar disorder, had ECT treatments and also was in several inpatient units.  Her mother also attempted suicide several times.  Her father had alcoholism.  Her brother and sister also had drug abuse problems in the past.  Physical Exam Neurological:     Mental Status: She is alert and oriented to person, place, and time.  Psychiatric:        Attention and Perception: She is inattentive. She perceives visual hallucinations. She does not perceive auditory hallucinations.        Mood and Affect: Mood is anxious and depressed. Affect is blunt.        Speech: Speech normal.        Behavior: Behavior is agitated and withdrawn. Behavior is cooperative.        Thought Content: Thought content is not paranoid or delusional. Thought content does not include homicidal or suicidal ideation. Thought content does not include homicidal or suicidal plan.        Cognition and  Memory: She exhibits impaired recent memory.   Behavioral Observations:   Appearance: Neatly, casually and appropriately dressed and groomed Gait: Ambulated independently, no gross abnormalities observed Speech: Fluent; normal rate, rhythm and volume. Mild word finding difficulty. Thought process: Linear, goal directed Affect: Full, anxious Interpersonal: Pleasant, appropriate  60 minutes spent face-to-face with patient completing neurobehavioral status exam. 60 minutes spent integrating medical records/clinical data and completing this report. [id number] unit; [id number].  TESTING: There is medical necessity to proceed with neuropsychological assessment as the  results will be used to aid in differential diagnosis and clinical decision-making and to inform specific treatment recommendations. Per the patient, and medical records reviewed, there has been a change in cognitive functioning and a reasonable suspicion of a neurocognitive disorder.   Clinical Decision Making: In considering the patient's current level of functioning, level of presumed impairment, nature of symptoms, emotional and behavioral responses during the interview, level of literacy, and observed level of motivation, a battery of tests was selected and communicated to the psychometrician.   PLAN: The patient will return to complete the above referenced full battery of neuropsychological testing with a psychometrician under Dr. Ferne Coe supervision. Education regarding testing procedures was provided to the patient. Subsequently, the patient will see Dr. Sima Matas for a follow-up session at which time her test performances and his impressions and treatment recommendations will be reviewed in detail.   Evaluation ongoing; full report to follow.

## 2020-07-26 ENCOUNTER — Encounter: Payer: Self-pay | Admitting: Psychiatry

## 2020-07-26 ENCOUNTER — Telehealth (INDEPENDENT_AMBULATORY_CARE_PROVIDER_SITE_OTHER): Payer: Medicare HMO | Admitting: Psychiatry

## 2020-07-26 DIAGNOSIS — F0281 Dementia in other diseases classified elsewhere with behavioral disturbance: Secondary | ICD-10-CM | POA: Diagnosis not present

## 2020-07-26 DIAGNOSIS — F411 Generalized anxiety disorder: Secondary | ICD-10-CM | POA: Diagnosis not present

## 2020-07-26 DIAGNOSIS — F02A18 Dementia in other diseases classified elsewhere, mild, with other behavioral disturbance: Secondary | ICD-10-CM

## 2020-07-26 DIAGNOSIS — F3181 Bipolar II disorder: Secondary | ICD-10-CM | POA: Diagnosis not present

## 2020-07-26 MED ORDER — BUSPIRONE HCL 10 MG PO TABS: 20.0000 mg | ORAL_TABLET | Freq: Two times a day (BID) | ORAL | 1 refills | Status: DC

## 2020-07-26 NOTE — Progress Notes (Signed)
Virtual Visit via Video Note  I connected with Stacy [name] on 07/26/20 at 10:00 AM EDT by a video enabled telemedicine application and verified that I am speaking with the correct person using two identifiers.  Location Provider Location : Office Patient Location : Home  Participants: Patient , Provider    I discussed the limitations of evaluation and management by telemedicine and the availability of in person appointments. The patient expressed understanding and agreed to proceed.   I discussed the assessment and treatment plan with the patient. The patient was provided an opportunity to ask questions and all were answered. The patient agreed with the plan and demonstrated an understanding of the instructions.   The patient was advised to call back or seek an in-person evaluation if the symptoms worsen or if the condition fails to improve as anticipated.   [facility] MD OP Progress Note  07/26/2020 10:23 AM Stacy [name]  MRN:  [id number]  Chief Complaint:  Chief Complaint    Depression; Follow-up; Anxiety     HPI: Stacy [name] is a 60 year old Caucasian female, unemployed, married, lives in [place], has a history of bipolar disorder, GAD, major neurocognitive disorder, Sjogren's syndrome, hypertension, hypothyroidism, arthritis, migraine headache was evaluated by telemedicine today.  Patient today reports she continues to have crying spells at least couple of times a week.  She however has been able to cope with it.  She pushes herself to do things, goes walking and is able to function.  Patient reports she does struggle with anxiety although it is improving on the current medication regimen.  She continues to worry and feels nervous often.  She is compliant on the BuSpar and denies side effects.  She reports she has been noncompliant with her therapy appointments.  She felt it was not beneficial.  Patient does have memory problems.  She reports she had her first evaluation  with neuropsychologist yesterday.  She has to go back for testing.  She reports sleep is overall okay.  She denies any suicidality, homicidality or perceptual disturbances.  Patient denies any other concerns today.  Visit Diagnosis:    ICD-10-CM   1. Bipolar 2 disorder (HCC)  F31.81    Mixed mild in remission  2. GAD (generalized anxiety disorder)  F41.1 busPIRone (BUSPAR) 10 MG tablet  3. 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  Past trials of Paxil, Latuda, Lamictal, Seroquel, Depakote, Belsomra, Zyprexa, Abilify, Vraylar  Past Medical History:  Past Medical History:  Diagnosis Date  . Aneurysm ([place])   . Anxiety   . Arthritis   . Brain stem lesion   . Chronic kidney disease    kideny stone  . Depression   . Fibromyalgia   . GERD (gastroesophageal reflux disease)   . Headache   . Hypotension   . Hypothyroid   . Memory loss   . Migraine   . Raynaud disease   . Sjogren's syndrome ([place])   . Thyroid disease     Past Surgical History:  Procedure Laterality Date  . ABDOMINAL HYSTERECTOMY    . BASAL CELL CARCINOMA EXCISION    . BREAST BIOPSY    . CESAREAN SECTION    . EYE SURGERY    . KNEE SURGERY Left   . LITHOTRIPSY    . MUSCLE BIOPSY    . OOPHORECTOMY      Family  Psychiatric History: I have reviewed family psychiatric history from progress note on 12/10/2017  Family History:  Family History  Problem Relation Age of Onset  . Migraines Mother   . Anxiety disorder Mother   . Depression Mother   . Cancer Father   . Hypertension Father   . Stroke Father   . Heart attack Father   . Alcohol abuse Father   . Diabetes Brother   . Alcohol abuse Sister   . Drug abuse Sister   . Anxiety disorder Sister   . Depression Sister   . Drug abuse Brother   . Cancer Paternal Aunt   . Lupus Paternal Aunt    . Thyroid disease Paternal Aunt   . Diabetes Maternal Grandfather   . Anxiety disorder Maternal Grandmother   . Depression Maternal Grandmother     Social History: Reviewed social history from progress note on 12/10/2017 Social History   Socioeconomic History  . Marital status: Married    Spouse name: [name]  . Number of children: 2  . Years of education: 63  . Highest education level: Associate degree: occupational, Hotel manager, or vocational program  Occupational History  . Occupation: Disabled  Tobacco Use  . Smoking status: Never Smoker  . Smokeless tobacco: Never Used  Vaping Use  . Vaping Use: Never used  Substance and Sexual Activity  . Alcohol use: No    Alcohol/week: 0.0 standard drinks  . Drug use: Never  . Sexual activity: Yes    Birth control/protection: None  Other Topics Concern  . Not on file  Social History Narrative   Lives at home with husband. [name]).    Disabled.   Education college.   Right handed.   2 cups caffeine/daily   Social Determinants of Health   Financial Resource Strain: Not on file  Food Insecurity: Not on file  Transportation Needs: Not on file  Physical Activity: Not on file  Stress: Not on file  Social Connections: Not on file    Allergies:  Allergies  Allergen Reactions  . Penicillins Hives  . Amoxicillin Hives  . Depakote [Divalproex Sodium]     vomiting  . Lamictal [Lamotrigine]     Side effects of abdominal cramps, nausea , diarrhea ,nightmares    Metabolic Disorder Labs: Lab Results  Component Value Date   HGBA1C 5.0 11/13/2015   No results found for: PROLACTIN No results found for: CHOL, TRIG, HDL, CHOLHDL, VLDL, LDLCALC Lab Results  Component Value Date   TSH 1.780 10/26/2015   TSH 0.525 03/15/2015    Therapeutic Level Labs: No results found for: LITHIUM Lab Results  Component Value Date   VALPROATE 10 (L) 07/28/2019   No components found for:  CBMZ  Current Medications: Current Outpatient  Medications  Medication Sig Dispense Refill  . busPIRone (BUSPAR) 10 MG tablet Take 2 tablets (20 mg total) by mouth 2 (two) times daily. 120 tablet 1  . Ascorbic Acid (VITAMIN C) 100 MG tablet 1 tablet    . cevimeline (EVOXAC) 30 MG capsule 1 capsule    . Cholecalciferol (VITAMIN D-3) 1000 units CAPS Take by mouth daily.    . clonazePAM (KLONOPIN) 0.5 MG tablet Take 1 tablet (0.5 mg total) by mouth daily as needed for anxiety. AS NEEDED FOR SEVERE ANXIETY ATTACKS 15 tablet 2  . donepezil (ARICEPT) 10 MG tablet Take 10 mg by mouth every morning.    . gabapentin (NEURONTIN) 300 MG capsule Take 1 capsule by mouth two times daily 180 capsule 2  .  hydroxychloroquine (PLAQUENIL) 200 MG tablet     . hydrOXYzine (VISTARIL) 25 MG capsule Take 1 capsule (25 mg total) by mouth daily as needed (for severe anxiety). Anxiety/agitation- please limit use 90 capsule 0  . levothyroxine (SYNTHROID, LEVOTHROID) 175 MCG tablet Take 1 tablet (175 mcg total) by mouth daily before breakfast. 90 tablet 3  . loperamide (IMODIUM A-D) 2 MG tablet Take 1 tablet (2 mg total) by mouth 4 (four) times daily as needed for diarrhea or loose stools. 30 tablet 0  . meloxicam (MOBIC) 15 MG tablet Take 15 mg by mouth 3 (three) times daily.    . Multiple Vitamin (MULTI-VITAMIN) tablet Take 1 tablet by mouth daily.    . Multiple Vitamins-Minerals (PRESERVISION AREDS) CAPS See admin instructions.    Marland Kitchen omeprazole (PRILOSEC) 20 MG capsule Take 1 capsule (20 mg total) by mouth daily. 180 capsule 3  . omeprazole (PRILOSEC) 40 MG capsule Take 40 mg by mouth daily.    . predniSONE (DELTASONE) 5 MG tablet     . Rimegepant Sulfate 75 MG TBDP Take by mouth.    . SUMAtriptan (IMITREX) 100 MG tablet     . traMADol (ULTRAM) 50 MG tablet     . vitamin E 45 MG (100 UNITS) capsule 1 capsule    . zonisamide (ZONEGRAN) 50 MG capsule Take 1 tab qhs for 1 week then 2 caps for total of 100 mg.     No current facility-administered medications for this  visit.     Musculoskeletal: Strength & Muscle Tone: UTA Gait & Station: UTA Patient leans: N/A  Psychiatric Specialty Exam: Review of Systems  Psychiatric/Behavioral: The patient is nervous/anxious.        Mood swings - improving  All other systems reviewed and are negative.   There were no vitals taken for this visit.There is no height or weight on file to calculate BMI.  General Appearance: Casual  Eye Contact:  Fair  Speech:  Clear and Coherent  Volume:  Normal  Mood:  Anxious,mood swings  Affect:  Congruent  Thought Process:  Goal Directed and Descriptions of Associations: Intact  Orientation:  Full (Time, Place, and Person)  Thought Content: Logical   Suicidal Thoughts:  No  Homicidal Thoughts:  No  Memory:  Immediate;   Fair Recent;   Fair Remote;   Fair  Judgement:  Fair  Insight:  Fair  Psychomotor Activity:  Normal  Concentration:  Concentration: Fair and Attention Span: Fair  Recall:  [employer] of Knowledge: Fair  Language: Fair  Akathisia:  No  Handed:  Right  AIMS (if indicated): UTA  Assets:  Communication Skills Desire for Improvement Housing Social Support  ADL's:  Intact  Cognition: WNL  Sleep:  Fair   Screenings: [facility] Office Visit from 06/15/2018 in [facility] Office Visit from 05/20/2018 in [facility] Total Score 5 9    GAD-7   Flowsheet Row Video Visit from 07/26/2020 in [facility] from 11/11/2019 in [facility]  Total GAD-7 Score 8 [place] Visit from 01/01/2016 in [facility] Office Visit from 11/13/2015 in [facility] Office Visit from 10/26/2015 in [facility] Office Visit from 04/25/2015 in [facility] Office Visit from 10/25/2014 in [facility]  Total Score (max 30  points ) 25 26 21 23 29     PHQ2-9  Flowsheet Row Video Visit from 07/26/2020 in [facility] from 02/17/2020 in [facility] Counselor from 11/11/2019 in [facility] Office Visit from 02/08/2015 in [facility] Office Visit from 10/07/2014 in [facility]  PHQ-2 Total Score 1 6 6  0 4  PHQ-9 Total Score -- 18 21 -- 8    Flowsheet Row Video Visit from 07/26/2020 in [facility] ED from 02/17/2020 in [facility] No Risk No Risk       Assessment and Plan: Iracema Lanagan is a 60 year old Caucasian female who has a history of bipolar disorder type II, hypothyroidism, cognitive disorder, insomnia, Sjogren's syndrome, fibromyalgia was evaluated by telemedicine today.  Patient with psychosocial stressors of the current pandemic, her own medical problems.  Patient is currently making progress although she continues to struggle with anxiety.  She will benefit from the following plan.  Plan Bipolar disorder type II in remission Gabapentin as prescribed-prescribed for fibromyalgia however it is also on mood stabilizer.  We will continue the same.  GAD-improving GAD-7 today equals 8 Increase BuSpar to 20 mg p.o. twice daily Continue hydroxyzine 25 mg p.o. daily as needed for severe anxiety attacks only.  Patient advised to limit use. Patient has been noncompliant with CBT.  Encouraged to establish care with a new provider or reach out to her previous therapist.  Neurocognitive disorder with behavioral changes- patient has been referred for testing.   Genesight testing reviewed while making medication changes.  Follow-up in clinic in 6 weeks or sooner if needed.  This note was generated in part or whole with voice recognition software. Voice recognition is usually quite accurate but there are  transcription errors that can and very often do occur. I apologize for any typographical errors that were not detected and corrected.      Stacy Alert, MD 07/26/2020, 10:23 AM

## 2020-07-31 ENCOUNTER — Emergency Department: Payer: Medicare HMO

## 2020-07-31 ENCOUNTER — Inpatient Hospital Stay
Admission: EM | Admit: 2020-07-31 | Discharge: 2020-08-02 | DRG: 660 | Disposition: A | Discharge status: Home / Self Care | Payer: Medicare HMO | Attending: Internal Medicine | Admitting: Internal Medicine

## 2020-07-31 ENCOUNTER — Other Ambulatory Visit: Payer: Self-pay

## 2020-07-31 DIAGNOSIS — Z6825 Body mass index (BMI) 25.0-25.9, adult: Secondary | ICD-10-CM

## 2020-07-31 DIAGNOSIS — E669 Obesity, unspecified: Secondary | ICD-10-CM | POA: Diagnosis present

## 2020-07-31 DIAGNOSIS — E039 Hypothyroidism, unspecified: Secondary | ICD-10-CM | POA: Diagnosis not present

## 2020-07-31 DIAGNOSIS — F3175 Bipolar disorder, in partial remission, most recent episode depressed: Secondary | ICD-10-CM | POA: Diagnosis present

## 2020-07-31 DIAGNOSIS — F411 Generalized anxiety disorder: Secondary | ICD-10-CM

## 2020-07-31 DIAGNOSIS — Z79899 Other long term (current) drug therapy: Secondary | ICD-10-CM

## 2020-07-31 DIAGNOSIS — Z823 Family history of stroke: Secondary | ICD-10-CM

## 2020-07-31 DIAGNOSIS — M797 Fibromyalgia: Secondary | ICD-10-CM

## 2020-07-31 DIAGNOSIS — F3181 Bipolar II disorder: Secondary | ICD-10-CM | POA: Diagnosis not present

## 2020-07-31 DIAGNOSIS — Z811 Family history of alcohol abuse and dependence: Secondary | ICD-10-CM

## 2020-07-31 DIAGNOSIS — Z85828 Personal history of other malignant neoplasm of skin: Secondary | ICD-10-CM

## 2020-07-31 DIAGNOSIS — Z79890 Hormone replacement therapy (postmenopausal): Secondary | ICD-10-CM

## 2020-07-31 DIAGNOSIS — Z20822 Contact with and (suspected) exposure to covid-19: Secondary | ICD-10-CM | POA: Diagnosis present

## 2020-07-31 DIAGNOSIS — Z8679 Personal history of other diseases of the circulatory system: Secondary | ICD-10-CM

## 2020-07-31 DIAGNOSIS — I10 Essential (primary) hypertension: Secondary | ICD-10-CM | POA: Diagnosis present

## 2020-07-31 DIAGNOSIS — Z833 Family history of diabetes mellitus: Secondary | ICD-10-CM

## 2020-07-31 DIAGNOSIS — Z809 Family history of malignant neoplasm, unspecified: Secondary | ICD-10-CM

## 2020-07-31 DIAGNOSIS — G629 Polyneuropathy, unspecified: Secondary | ICD-10-CM | POA: Diagnosis present

## 2020-07-31 DIAGNOSIS — Z791 Long term (current) use of non-steroidal anti-inflammatories (NSAID): Secondary | ICD-10-CM

## 2020-07-31 DIAGNOSIS — F418 Other specified anxiety disorders: Secondary | ICD-10-CM

## 2020-07-31 DIAGNOSIS — I7300 Raynaud's syndrome without gangrene: Secondary | ICD-10-CM | POA: Diagnosis present

## 2020-07-31 DIAGNOSIS — R579 Shock, unspecified: Secondary | ICD-10-CM | POA: Diagnosis present

## 2020-07-31 DIAGNOSIS — N200 Calculus of kidney: Secondary | ICD-10-CM

## 2020-07-31 DIAGNOSIS — M1990 Unspecified osteoarthritis, unspecified site: Secondary | ICD-10-CM | POA: Diagnosis present

## 2020-07-31 DIAGNOSIS — N132 Hydronephrosis with renal and ureteral calculous obstruction: Principal | ICD-10-CM | POA: Diagnosis present

## 2020-07-31 DIAGNOSIS — Z818 Family history of other mental and behavioral disorders: Secondary | ICD-10-CM

## 2020-07-31 DIAGNOSIS — R1084 Generalized abdominal pain: Secondary | ICD-10-CM

## 2020-07-31 DIAGNOSIS — Z813 Family history of other psychoactive substance abuse and dependence: Secondary | ICD-10-CM

## 2020-07-31 DIAGNOSIS — N3289 Other specified disorders of bladder: Secondary | ICD-10-CM | POA: Diagnosis present

## 2020-07-31 DIAGNOSIS — Z888 Allergy status to other drugs, medicaments and biological substances status: Secondary | ICD-10-CM

## 2020-07-31 DIAGNOSIS — N201 Calculus of ureter: Secondary | ICD-10-CM | POA: Diagnosis not present

## 2020-07-31 DIAGNOSIS — Z90710 Acquired absence of both cervix and uterus: Secondary | ICD-10-CM

## 2020-07-31 DIAGNOSIS — R4189 Other symptoms and signs involving cognitive functions and awareness: Secondary | ICD-10-CM | POA: Diagnosis present

## 2020-07-31 DIAGNOSIS — K219 Gastro-esophageal reflux disease without esophagitis: Secondary | ICD-10-CM | POA: Diagnosis present

## 2020-07-31 DIAGNOSIS — Z7952 Long term (current) use of systemic steroids: Secondary | ICD-10-CM

## 2020-07-31 DIAGNOSIS — Z87442 Personal history of urinary calculi: Secondary | ICD-10-CM

## 2020-07-31 DIAGNOSIS — Z8249 Family history of ischemic heart disease and other diseases of the circulatory system: Secondary | ICD-10-CM

## 2020-07-31 DIAGNOSIS — G43909 Migraine, unspecified, not intractable, without status migrainosus: Secondary | ICD-10-CM | POA: Diagnosis present

## 2020-07-31 DIAGNOSIS — K5900 Constipation, unspecified: Secondary | ICD-10-CM | POA: Diagnosis not present

## 2020-07-31 DIAGNOSIS — Z880 Allergy status to penicillin: Secondary | ICD-10-CM

## 2020-07-31 DIAGNOSIS — M3500 Sicca syndrome, unspecified: Secondary | ICD-10-CM

## 2020-07-31 DIAGNOSIS — F3341 Major depressive disorder, recurrent, in partial remission: Secondary | ICD-10-CM

## 2020-07-31 DIAGNOSIS — R109 Unspecified abdominal pain: Secondary | ICD-10-CM | POA: Diagnosis present

## 2020-07-31 LAB — URINALYSIS, COMPLETE (UACMP) WITH MICROSCOPIC
Bilirubin Urine: NEGATIVE
Glucose, UA: NEGATIVE mg/dL
Ketones, ur: 20 mg/dL — AB
Leukocytes,Ua: NEGATIVE
Nitrite: NEGATIVE
Protein, ur: NEGATIVE mg/dL
Specific Gravity, Urine: 1.006 (ref 1.005–1.030)
pH: 7 (ref 5.0–8.0)

## 2020-07-31 LAB — COMPREHENSIVE METABOLIC PANEL
ALT: 13 U/L (ref 0–44)
AST: 21 U/L (ref 15–41)
Albumin: 4.4 g/dL (ref 3.5–5.0)
Alkaline Phosphatase: 73 U/L (ref 38–126)
Anion gap: 10 (ref 5–15)
BUN: 15 mg/dL (ref 6–20)
CO2: 23 mmol/L (ref 22–32)
Calcium: 9.3 mg/dL (ref 8.9–10.3)
Chloride: 106 mmol/L (ref 98–111)
Creatinine, Ser: 1.05 mg/dL — ABNORMAL HIGH (ref 0.44–1.00)
GFR, Estimated: >60 mL/min (ref >60)
Glucose, Bld: 110 mg/dL — ABNORMAL HIGH (ref 70–99)
Potassium: 4.1 mmol/L (ref 3.5–5.1)
Sodium: 139 mmol/L (ref 135–145)
Total Bilirubin: 0.8 mg/dL (ref 0.3–1.2)
Total Protein: 7 g/dL (ref 6.5–8.1)

## 2020-07-31 LAB — CBC
HCT: 39.8 % (ref 36.0–46.0)
Hemoglobin: 13.6 g/dL (ref 12.0–15.0)
MCH: 31.7 pg (ref 26.0–34.0)
MCHC: 34.2 g/dL (ref 30.0–36.0)
MCV: 92.8 fL (ref 80.0–100.0)
Platelets: 211 10*3/uL (ref 150–400)
RBC: 4.29 MIL/uL (ref 3.87–5.11)
RDW: 13.2 % (ref 11.5–15.5)
WBC: 7.9 10*3/uL (ref 4.0–10.5)
nRBC: 0 % (ref 0.0–0.2)

## 2020-07-31 LAB — LIPASE, BLOOD: Lipase: 50 U/L (ref 11–51)

## 2020-07-31 MED ORDER — LEVOTHYROXINE SODIUM 50 MCG PO TABS
175.0000 ug | ORAL_TABLET | Freq: Every day | ORAL | Status: DC
  Administered 2020-08-01 – 2020-08-02 (×2): 175 ug via ORAL
  Filled 2020-07-31: qty 2

## 2020-07-31 MED ORDER — KETOROLAC TROMETHAMINE 30 MG/ML IJ SOLN
30.0000 mg | Freq: Once | INTRAMUSCULAR | Status: AC
  Administered 2020-07-31: 30 mg via INTRAVENOUS
  Filled 2020-07-31: qty 1

## 2020-07-31 MED ORDER — ONDANSETRON HCL 4 MG/2ML IJ SOLN
4.0000 mg | Freq: Four times a day (QID) | INTRAMUSCULAR | Status: DC | PRN
  Filled 2020-07-31: qty 2

## 2020-07-31 MED ORDER — SUMATRIPTAN SUCCINATE 50 MG PO TABS
100.0000 mg | ORAL_TABLET | ORAL | Status: DC | PRN
  Filled 2020-07-31: qty 2

## 2020-07-31 MED ORDER — HEPARIN SODIUM (PORCINE) 5000 UNIT/ML IJ SOLN
5000.0000 [IU] | Freq: Three times a day (TID) | INTRAMUSCULAR | Status: AC
  Administered 2020-07-31: 5000 [IU] via SUBCUTANEOUS
  Filled 2020-07-31: qty 1

## 2020-07-31 MED ORDER — CLONAZEPAM 0.5 MG PO TABS: 0.5000 mg | ORAL_TABLET | Freq: Two times a day (BID) | ORAL | Status: AC | PRN

## 2020-07-31 MED ORDER — PANTOPRAZOLE SODIUM 40 MG PO TBEC
80.0000 mg | DELAYED_RELEASE_TABLET | Freq: Every day | ORAL | Status: DC
  Administered 2020-08-01 – 2020-08-02 (×2): 80 mg via ORAL
  Filled 2020-07-31 (×2): qty 2

## 2020-07-31 MED ORDER — VITAMIN D 25 MCG (1000 UNIT) PO TABS
1000.0000 [IU] | ORAL_TABLET | Freq: Every day | ORAL | Status: DC
  Administered 2020-08-01 – 2020-08-02 (×2): 1000 [IU] via ORAL
  Filled 2020-07-31 (×2): qty 1

## 2020-07-31 MED ORDER — ACETAMINOPHEN 650 MG RE SUPP: 650.0000 mg | Freq: Four times a day (QID) | RECTAL | Status: DC | PRN

## 2020-07-31 MED ORDER — BUSPIRONE HCL 10 MG PO TABS
20.0000 mg | ORAL_TABLET | Freq: Two times a day (BID) | ORAL | Status: DC
  Administered 2020-07-31 – 2020-08-02 (×4): 20 mg via ORAL
  Filled 2020-07-31 (×6): qty 2

## 2020-07-31 MED ORDER — LACTATED RINGERS IV BOLUS
1000.0000 mL | Freq: Once | INTRAVENOUS | Status: AC
  Administered 2020-07-31: 1000 mL via INTRAVENOUS

## 2020-07-31 MED ORDER — DONEPEZIL HCL 5 MG PO TABS
10.0000 mg | ORAL_TABLET | Freq: Every morning | ORAL | Status: DC
  Administered 2020-08-01 – 2020-08-02 (×2): 10 mg via ORAL
  Filled 2020-07-31 (×3): qty 2

## 2020-07-31 MED ORDER — KETOROLAC TROMETHAMINE 30 MG/ML IJ SOLN
15.0000 mg | Freq: Four times a day (QID) | INTRAMUSCULAR | Status: AC | PRN
  Administered 2020-07-31 – 2020-08-01 (×3): 15 mg via INTRAVENOUS
  Filled 2020-07-31 (×3): qty 1

## 2020-07-31 MED ORDER — ACETAMINOPHEN 325 MG PO TABS: 650.0000 mg | ORAL_TABLET | Freq: Four times a day (QID) | ORAL | Status: DC | PRN

## 2020-07-31 MED ORDER — MORPHINE SULFATE (PF) 2 MG/ML IV SOLN: 2.0000 mg | INTRAVENOUS | Status: DC | PRN

## 2020-07-31 MED ORDER — ONDANSETRON HCL 4 MG/2ML IJ SOLN
INTRAMUSCULAR | Status: AC
  Administered 2020-07-31: 4 mg via INTRAVENOUS
  Filled 2020-07-31: qty 2

## 2020-07-31 MED ORDER — MORPHINE SULFATE (PF) 4 MG/ML IV SOLN
4.0000 mg | Freq: Once | INTRAVENOUS | Status: AC
  Administered 2020-07-31: 4 mg via INTRAVENOUS
  Filled 2020-07-31: qty 1

## 2020-07-31 MED ORDER — ONDANSETRON HCL 4 MG/2ML IJ SOLN
4.0000 mg | Freq: Once | INTRAMUSCULAR | Status: AC | PRN
  Administered 2020-07-31: 4 mg via INTRAVENOUS
  Filled 2020-07-31: qty 2

## 2020-07-31 MED ORDER — HYDROXYZINE HCL 25 MG PO TABS
25.0000 mg | ORAL_TABLET | Freq: Every day | ORAL | Status: DC | PRN
  Filled 2020-07-31: qty 1

## 2020-07-31 MED ORDER — SODIUM CHLORIDE 0.9 % IV SOLN
1000.0000 mL | Freq: Once | INTRAVENOUS | Status: AC
  Administered 2020-07-31: 1000 mL via INTRAVENOUS

## 2020-07-31 MED ORDER — LORAZEPAM 2 MG/ML IJ SOLN
0.5000 mg | Freq: Once | INTRAMUSCULAR | Status: AC
  Administered 2020-07-31: 0.5 mg via INTRAVENOUS
  Filled 2020-07-31: qty 1

## 2020-07-31 MED ORDER — ONDANSETRON HCL 4 MG/2ML IJ SOLN: 4.0000 mg | Freq: Once | INTRAMUSCULAR | Status: AC

## 2020-07-31 MED ORDER — ASCORBIC ACID 500 MG PO TABS
250.0000 mg | ORAL_TABLET | Freq: Every day | ORAL | Status: DC
  Administered 2020-08-01 – 2020-08-02 (×2): 250 mg via ORAL
  Filled 2020-07-31 (×2): qty 1

## 2020-07-31 MED ORDER — GABAPENTIN 300 MG PO CAPS
300.0000 mg | ORAL_CAPSULE | Freq: Two times a day (BID) | ORAL | Status: DC
  Administered 2020-07-31 – 2020-08-02 (×4): 300 mg via ORAL
  Filled 2020-07-31 (×4): qty 1

## 2020-07-31 MED ORDER — HYDROXYCHLOROQUINE SULFATE 200 MG PO TABS
200.0000 mg | ORAL_TABLET | Freq: Two times a day (BID) | ORAL | Status: DC
  Administered 2020-07-31 – 2020-08-02 (×4): 200 mg via ORAL
  Filled 2020-07-31 (×6): qty 1

## 2020-07-31 MED ORDER — MORPHINE SULFATE (PF) 4 MG/ML IV SOLN
4.0000 mg | INTRAVENOUS | Status: DC | PRN
  Administered 2020-07-31: 4 mg via INTRAVENOUS
  Filled 2020-07-31: qty 1

## 2020-07-31 MED ORDER — ONDANSETRON HCL 4 MG PO TABS: 4.0000 mg | ORAL_TABLET | Freq: Four times a day (QID) | ORAL | Status: DC | PRN

## 2020-07-31 NOTE — Progress Notes (Signed)
Urology preconsult note- formal consult to follow in a.m.  Contacted by Dr. Corky Downs.  60 year old female with 9 mm left proximal obstructing ureteral calculus as well as incidental 3 mm right distal nonobstructing stone.  Images, labs, vitals reviewed.  No indication that there is an underlying infection however the patient will be admitted overnight for pain control.    Please keep patient n.p.o. at midnight in order to establish a surgical plan as she would likely need some sort of intervention for 9 mm stone on the left which may include ESWL Thursday versus bilateral ureteroscopy with urgency TBD pain control.  Hollice Espy, MD

## 2020-07-31 NOTE — ED Provider Notes (Signed)
[facility] Emergency Department Provider Note   ____________________________________________    I have reviewed the triage vital signs and the nursing notes.   HISTORY  Chief Complaint Abdominal Pain and Emesis     HPI Stacy [name] is a 60 y.o. female who presents with complaints of left lower quadrant abdominal pain.  Patient reports he has had intermittent pain in the area for a few days however this became acutely worse.  She reports the pain is severe and sharp in nature.  She is having nausea and vomiting.  No fevers reported.  Unclear if she has taken anything for this.  She reports a history of a kidney stone in the past  Past Medical History:  Diagnosis Date  . Aneurysm ([place])   . Anxiety   . Arthritis   . Brain stem lesion   . Chronic kidney disease    kideny stone  . Depression   . Fibromyalgia   . GERD (gastroesophageal reflux disease)   . Headache   . Hypotension   . Hypothyroid   . Memory loss   . Migraine   . Raynaud disease   . Sjogren's syndrome ([place])   . Thyroid disease     Patient Active Problem List   Diagnosis Date Noted  . Pain in right knee 03/30/2020  . Osteopenia 03/30/2020  . Inflammatory arthritis 03/30/2020  . Hand pain 03/30/2020  . 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 Mild cognitive disorder 07/09/2019  . Bipolar 2 disorder ([place]) 09/25/2018  . GAD (generalized anxiety disorder) 09/25/2018  . Insomnia due to mental condition 09/25/2018  . 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 Mild cognitive impairment 01/01/2016  . 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 Acquired hypothyroidism 06/19/2015  . High risk medication use 05/25/2015  . Chronic lumbosacral pain 05/08/2015  . Left lower quadrant pain 02/08/2015  . Diverticulitis of large intestine without perforation or abscess without bleeding  02/08/2015  . Fibromyalgia 10/07/2014  . Hypothyroidism 10/07/2014  . History of kidney stones 10/07/2014  . Major depressive disorder, recurrent episode, in partial remission with anxious distress ([place]) 10/07/2014  . Overweight (BMI 25.0-29.9) 10/07/2014  . Dementia without behavioral disturbance ([place]) 06/03/2014  . Common migraine with intractable migraine 09/23/2013  . Basal cell carcinoma 07/13/2013  . Hot flash, menopausal 07/13/2013  . 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 Calculus of kidney 07/13/2013  . Headache, migraine 07/13/2013  . 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 Blush 07/13/2013  . Sjogren's syndrome ([place]) 07/13/2013  . Hot flashes 07/13/2013    Past Surgical History:  Procedure Laterality Date  . ABDOMINAL HYSTERECTOMY    . BASAL CELL CARCINOMA EXCISION    . BREAST BIOPSY    . CESAREAN SECTION    . EYE SURGERY    . KNEE SURGERY Left   . LITHOTRIPSY    . MUSCLE BIOPSY    . OOPHORECTOMY      Prior to Admission medications   Medication Sig Start Date End Date Taking? Authorizing Provider  Ascorbic Acid (VITAMIN C) 100 MG tablet 1 tablet    [provider]  busPIRone (BUSPAR) 10 MG tablet Take 2 tablets (20 mg total) by mouth 2 (two) times daily. 07/26/20   Ursula Alert, MD  cevimeline (EVOXAC) 30 MG capsule 1 capsule    [provider]  Cholecalciferol (VITAMIN D-3) 1000 units CAPS Take  by mouth daily.    [provider]  clonazePAM (KLONOPIN) 0.5 MG tablet Take 1 tablet (0.5 mg total) by mouth daily as needed for anxiety. AS NEEDED FOR SEVERE ANXIETY ATTACKS 03/30/20   Ursula Alert, MD  donepezil (ARICEPT) 10 MG tablet Take 10 mg by mouth every morning.    [provider]  gabapentin (NEURONTIN) 300 MG capsule Take 1 capsule by mouth two times daily 10/24/14   Bobetta Lime, MD  hydroxychloroquine (PLAQUENIL) 200 MG tablet  06/03/19    [provider]  hydrOXYzine (VISTARIL) 25 MG capsule Take 1 capsule (25 mg total) by mouth daily as needed (for severe anxiety). Anxiety/agitation- please limit use 06/10/19   Ursula Alert, MD  levothyroxine (SYNTHROID, LEVOTHROID) 175 MCG tablet Take 1 tablet (175 mcg total) by mouth daily before breakfast. 12/13/15   Renato Shin, MD  loperamide (IMODIUM A-D) 2 MG tablet Take 1 tablet (2 mg total) by mouth 4 (four) times daily as needed for diarrhea or loose stools. 07/06/18   Cuthriell, Charline Bills, PA-C  meloxicam (MOBIC) 15 MG tablet Take 15 mg by mouth 3 (three) times daily. 02/04/20   [provider]  Multiple Vitamin (MULTI-VITAMIN) tablet Take 1 tablet by mouth daily.    [provider]  Multiple Vitamins-Minerals (PRESERVISION AREDS) CAPS See admin instructions.    [provider]  omeprazole (PRILOSEC) 20 MG capsule Take 1 capsule (20 mg total) by mouth daily. 10/25/14   Bobetta Lime, MD  omeprazole (PRILOSEC) 40 MG capsule Take 40 mg by mouth daily. 12/24/19   [provider]  predniSONE (DELTASONE) 5 MG tablet  12/30/19   [provider]  Rimegepant Sulfate 75 MG TBDP Take by mouth.    [provider]  SUMAtriptan (IMITREX) 100 MG tablet  05/06/18   [provider]  traMADol Veatrice Bourbon) 50 MG tablet  12/30/19   [provider]  vitamin E 45 MG (100 UNITS) capsule 1 capsule    [provider]  zonisamide (ZONEGRAN) 50 MG capsule Take 1 tab qhs for 1 week then 2 caps for total of 100 mg. 12/30/19   [provider]     Allergies Penicillins, Amoxicillin, Depakote [divalproex sodium], and Lamictal [lamotrigine]  Family History  Problem Relation Age of Onset  . Migraines Mother   . Anxiety disorder Mother   . Depression Mother   . Cancer Father   . Hypertension Father   . Stroke Father   . Heart attack Father   . Alcohol abuse Father   . Diabetes Brother   . Alcohol abuse Sister    . Drug abuse Sister   . Anxiety disorder Sister   . Depression Sister   . Drug abuse Brother   . Cancer Paternal Aunt   . Lupus Paternal Aunt   . Thyroid disease Paternal Aunt   . Diabetes Maternal Grandfather   . Anxiety disorder Maternal Grandmother   . Depression Maternal Grandmother     Social History Social History   Tobacco Use  . Smoking status: Never Smoker  . Smokeless tobacco: Never Used  Vaping Use  . Vaping Use: Never used  Substance Use Topics  . Alcohol use: No    Alcohol/week: 0.0 standard drinks  . Drug use: Never    Review of Systems  Constitutional: No fever/chills Eyes: No visual changes.  ENT: No sore throat. Cardiovascular: Denies chest pain. Respiratory: Denies shortness of breath. Gastrointestinal: As above Genitourinary: Negative for dysuria. Musculoskeletal: Negative for back pain.  Skin: Negative for rash. Neurological: Negative for headaches or weakness   ____________________________________________   PHYSICAL EXAM:  VITAL SIGNS: ED Triage Vitals  Enc Vitals Group     BP 07/31/20 1507 121/78     Pulse Rate 07/31/20 1507 74     Resp 07/31/20 1507 20     Temp 07/31/20 1507 97.8 F (36.6 C)     Temp Source 07/31/20 1507 Oral     SpO2 07/31/20 1507 100 %     Weight 07/31/20 1508 71.2 kg (157 lb)     Height 07/31/20 1508 1.702 m (5\' 7" )     Head Circumference --      Peak Flow --      Pain Score 07/31/20 1508 10     Pain Loc --      Pain Edu? --      Excl. in [place]? --    Constitutional: Alert and oriented. No acute distress. Pleasant and interactive Eyes: Conjunctivae are normal.  Head: Atraumatic. Nose: No congestion/rhinnorhea. Mouth/Throat: Mucous membranes are moist.   Neck:  Painless ROM Cardiovascular: Normal rate, regular rhythm. Grossly normal heart sounds.  Good peripheral circulation. Respiratory: Normal respiratory effort.  No retractions. Lungs CTAB. Gastrointestinal: Soft and nontender. No distention.  No CVA  tenderness. Genitourinary: deferred Musculoskeletal: No lower extremity tenderness nor edema.  Warm and well perfused Neurologic:  Normal speech and language. No gross focal neurologic deficits are appreciated.  Skin:  Skin is warm, dry and intact. No rash noted. Psychiatric: Mood and affect are normal. Speech and behavior are normal.  ____________________________________________   LABS (all labs ordered are listed, but only abnormal results are displayed)  Labs Reviewed  COMPREHENSIVE METABOLIC PANEL - Abnormal; Notable for the following components:      Result Value   Glucose, Bld 110 (*)    Creatinine, Ser 1.05 (*)    All other components within normal limits  URINALYSIS, COMPLETE (UACMP) WITH MICROSCOPIC - Abnormal; Notable for the following components:   Color, Urine STRAW (*)    APPearance CLEAR (*)    Hgb urine dipstick MODERATE (*)    Ketones, ur 20 (*)    Bacteria, UA RARE (*)    All other components within normal limits  SARS CORONAVIRUS 2 (TAT 6-24 HRS)  LIPASE, BLOOD  CBC   ____________________________________________  EKG  None ____________________________________________  RADIOLOGY  CT scan demonstrates 9 mm left proximal ureter stone as well as 3 mm distal right ureter stone ____________________________________________   PROCEDURES  Procedure(s) performed: No  Procedures   Critical Care performed: No ____________________________________________   INITIAL IMPRESSION / ASSESSMENT AND PLAN / ED COURSE  Pertinent labs & imaging results that were available during my care of the patient were reviewed by me and considered in my medical decision making (see chart for details).  Patient presents with severe left lower quadrant pain, suspicious for ureterolithiasis, diverticulitis is also on differential.  Patient treated with IV Toradol, IV Zofran with mild improvement  IV morphine given.  Lab work overall reassuring, urinalysis demonstrates  hemoglobin.  CT scan consistent with left-sided proximal ureter stone, 9 mm and right-sided 3 mm calculus.  Creatinine is reassuring.  No evidence of infection.  Discussed with Dr. Hollice Espy of urology who recommends n.p.o. after midnight    ____________________________________________   FINAL CLINICAL IMPRESSION(S) / ED DIAGNOSES  Final diagnoses:  Ureterolithiasis        Note:  This document was prepared using Dragon voice recognition software and may include  unintentional dictation errors.   Lavonia Drafts, MD 07/31/20 [phone]

## 2020-07-31 NOTE — H&P (Signed)
History and Physical   Stacy [name] DJT:[id number] DOB: 08/30/1960 DOA: 07/31/2020  PCP: [facility] [place]  Outpatient Specialists: Dr. Jennings Books Patient coming from: home   I have personally briefly reviewed patient's old medical records in [place].  Chief Concern: Abdominal pain  HPI: Stacy [name] is a 60 y.o. female with medical history significant for cognitive impairment, bipolar type II, generalized anxiety disorder, presents to the emergency department for chief concerns of abdominal pain.  She reports that the pain started at approximately 1 pm today, sharp, persistent, at 10/10. She reports that currently the pain is 8/10 after pain medication. She denies fever, and states the pain is similar to previous episodes of kidney stones.   Social history; lives at home with husband. Doesn't smoke or use tobacco products, etoh, recreational drug use. 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   Vaccination: 2 doses, Moderna  ROS: Constitutional: no weight change, no fever ENT/Mouth: no sore throat, no rhinorrhea Eyes: no eye pain, no vision changes Cardiovascular: no chest pain, no dyspnea,  no edema, no palpitations Respiratory: no cough, no sputum, no wheezing Gastrointestinal: no nausea, no vomiting, no diarrhea, no constipation Genitourinary: no urinary incontinence, no dysuria, no hematuria Musculoskeletal: no arthralgias, no myalgias Skin: no skin lesions, no pruritus, Neuro: + weakness, no loss of consciousness, no syncope Psych: no anxiety, no depression, + decrease appetite Heme/Lymph: no bruising, no bleeding  ED Course: Discussed with emergency medicine provider, patient requiring hospitalization for pain control.  Vitals in the emergency department showed temperature of 97.8, respiration rate of 20, heart rate 73, blood pressure 118/76, SPO2 of 100% on room air.  Labs in the emergency department was remarkable  for nonfasting blood glucose 110, serum creatinine of 1.05, urinalysis was read as clear straw-colored, negative for leukocytes and nitrites.  ED provider ordered a CT renal study which was read as previously 9 mm calculus in the upper pole of the left kidney is now in the proximal ureter with resultant mild left hydronephrosis.  New 3 mm calculus in the distal right ureter approximately 1 cm from the UVJ, without associated hydroureteronephrosis.  Assessment/Plan  Principal Problem:   Abdominal pain Active Problems:   Calculus of kidney   Fibromyalgia   Major depressive disorder, recurrent episode, in partial remission with anxious distress (HCC)   Sjogren's syndrome (HCC)   Acquired hypothyroidism   Bipolar 2 disorder (HCC)   GAD (generalized anxiety disorder)   Abdominal pain secondary to renal calculus -Urology has been consulted and recommends the patient remain n.p.o. after midnight -Patient is status post ketorolac 30 mg IV, morphine 4 mg IV per EDP with improvement -Pain control: Morphine 4 mg IV every 4 hours for severe pain, ketorolac 15 mg every 6 hours as needed for moderate pain  History of migraine headaches-resumed home sumatriptan 100 mg every 2 hours as needed for migraines  Anxiety-  - Hydroxyzine 25 mg as needed for severe anxiety - Resumed home clonazepam 0.5 mg twice daily as needed for anxiety  Hypothyroid-resumed levothyroxine 175 mcg daily before breakfast resumed  Cognitive impairment versus decline-resume donepezil 10 mg every morning  Depression-resumed buspirone 20 mg twice daily  Sjogren's syndrome-resumed hydroxychloroquine 200 mg p.o. twice daily  GERD-PPI  Neuropathy-gabapentin 300 mg twice daily home  DVT prophylaxis- heparin 5000 units subcutaneously 1 dose ordered due to possible urologic procedure in the a.m. - A.m. team to order pharmacologic DVT prophylaxis when appropriate  Chart  reviewed.   DVT prophylaxis: Heparin 5000 units  subcutaneous, only 1 dose ordered Code Status: full code  Diet: Heart healthy now, n.p.o. after midnight Family Communication: Updated husband at bedside Disposition Plan: Pending clinical course Consults called: Urology Admission status: MedSurg, observation, no telemetry  Past Medical History:  Diagnosis Date  . Aneurysm ([place])   . Anxiety   . Arthritis   . Brain stem lesion   . Chronic kidney disease    kideny stone  . Depression   . Fibromyalgia   . GERD (gastroesophageal reflux disease)   . Headache   . Hypotension   . Hypothyroid   . Memory loss   . Migraine   . Raynaud disease   . Sjogren's syndrome ([place])   . Thyroid disease    Past Surgical History:  Procedure Laterality Date  . ABDOMINAL HYSTERECTOMY    . BASAL CELL CARCINOMA EXCISION    . BREAST BIOPSY    . CESAREAN SECTION    . EYE SURGERY    . KNEE SURGERY Left   . LITHOTRIPSY    . MUSCLE BIOPSY    . OOPHORECTOMY     Social History:  reports that she has never smoked. She has never used smokeless tobacco. She reports that she does not drink alcohol and does not use drugs.  Allergies  Allergen Reactions  . Penicillins Hives  . Amoxicillin Hives  . Depakote [Divalproex Sodium]     vomiting  . Lamictal [Lamotrigine]     Side effects of abdominal cramps, nausea , diarrhea ,nightmares   Family History  Problem Relation Age of Onset  . Migraines Mother   . Anxiety disorder Mother   . Depression Mother   . Cancer Father   . Hypertension Father   . Stroke Father   . Heart attack Father   . Alcohol abuse Father   . Diabetes Brother   . Alcohol abuse Sister   . Drug abuse Sister   . Anxiety disorder Sister   . Depression Sister   . Drug abuse Brother   . Cancer Paternal Aunt   . Lupus Paternal Aunt   . Thyroid disease Paternal Aunt   . Diabetes Maternal Grandfather   . Anxiety disorder Maternal Grandmother   . Depression Maternal Grandmother    Family history: Family history reviewed and not  pertinent  Prior to Admission medications   Medication Sig Start Date End Date Taking? Authorizing Provider  Ascorbic Acid (VITAMIN C) 100 MG tablet 1 tablet    [provider]  busPIRone (BUSPAR) 10 MG tablet Take 2 tablets (20 mg total) by mouth 2 (two) times daily. 07/26/20   Ursula Alert, MD  cevimeline (EVOXAC) 30 MG capsule 1 capsule    [provider]  Cholecalciferol (VITAMIN D-3) 1000 units CAPS Take by mouth daily.    [provider]  clonazePAM (KLONOPIN) 0.5 MG tablet Take 1 tablet (0.5 mg total) by mouth daily as needed for anxiety. AS NEEDED FOR SEVERE ANXIETY ATTACKS 03/30/20   Ursula Alert, MD  donepezil (ARICEPT) 10 MG tablet Take 10 mg by mouth every morning.    [provider]  gabapentin (NEURONTIN) 300 MG capsule Take 1 capsule by mouth two times daily 10/24/14   Bobetta Lime, MD  hydroxychloroquine (PLAQUENIL) 200 MG tablet  06/03/19   [provider]  hydrOXYzine (VISTARIL) 25 MG capsule Take 1 capsule (25 mg total) by mouth daily as needed (for severe anxiety). Anxiety/agitation- please limit use 06/10/19  Ursula Alert, MD  levothyroxine (SYNTHROID, LEVOTHROID) 175 MCG tablet Take 1 tablet (175 mcg total) by mouth daily before breakfast. 12/13/15   Renato Shin, MD  loperamide (IMODIUM A-D) 2 MG tablet Take 1 tablet (2 mg total) by mouth 4 (four) times daily as needed for diarrhea or loose stools. 07/06/18   Cuthriell, Charline Bills, PA-C  meloxicam (MOBIC) 15 MG tablet Take 15 mg by mouth 3 (three) times daily. 02/04/20   [provider]  Multiple Vitamin (MULTI-VITAMIN) tablet Take 1 tablet by mouth daily.    [provider]  Multiple Vitamins-Minerals (PRESERVISION AREDS) CAPS See admin instructions.    [provider]  omeprazole (PRILOSEC) 20 MG capsule Take 1 capsule (20 mg total) by mouth daily. 10/25/14   Bobetta Lime, MD  omeprazole (PRILOSEC) 40 MG capsule Take 40 mg by mouth daily.  12/24/19   [provider]  predniSONE (DELTASONE) 5 MG tablet  12/30/19   [provider]  Rimegepant Sulfate 75 MG TBDP Take by mouth.    [provider]  SUMAtriptan (IMITREX) 100 MG tablet  05/06/18   [provider]  traMADol Veatrice Bourbon) 50 MG tablet  12/30/19   [provider]  vitamin E 45 MG (100 UNITS) capsule 1 capsule    [provider]  zonisamide (ZONEGRAN) 50 MG capsule Take 1 tab qhs for 1 week then 2 caps for total of 100 mg. 12/30/19   [provider]   Physical Exam: Vitals:   07/31/20 1507 07/31/20 1508 07/31/20 1615  BP: 121/78  118/76  Pulse: 74  74  Resp: 20  20  Temp: 97.8 F (36.6 C)    TempSrc: Oral    SpO2: 100%  100%  Weight:  71.2 kg   Height:  5\' 7"  (1.702 m)    Constitutional: appears age appropriate, NAD, calm, comfortable Eyes: PERRL, lids and conjunctivae normal ENMT: Mucous membranes are moist. Posterior pharynx clear of any exudate or lesions. Age-appropriate dentition. Hearing appropriate Neck: normal, supple, no masses, no thyromegaly Respiratory: clear to auscultation bilaterally, no wheezing, no crackles. Normal respiratory effort. No accessory muscle use.  Cardiovascular: Regular rate and rhythm, no murmurs / rubs / gallops. No extremity edema. 2+ pedal pulses. No carotid bruits.  Abdomen: no tenderness, no masses palpated, no hepatosplenomegaly. Bowel sounds positive.  Musculoskeletal: no clubbing / cyanosis. No joint deformity upper and lower extremities. Good ROM, no contractures, no atrophy. Normal muscle tone.  Skin: no rashes, lesions, ulcers. No induration Neurologic: Sensation intact. Strength 5/5 in all 4.  Psychiatric: Normal judgment and insight. Alert and oriented x 3. Normal mood.   EKG: Not indicated.  Chest x-ray on Admission: I personally reviewed and I agree with radiologist reading as below.  CT Renal Stone Study  Result Date: 07/31/2020 CLINICAL DATA:  Intermittent  left lower quadrant abdominal pain for the past week. EXAM: CT ABDOMEN AND PELVIS WITHOUT CONTRAST TECHNIQUE: Multidetector CT imaging of the abdomen and pelvis was performed following the standard protocol without IV contrast. COMPARISON:  CT abdomen pelvis dated Jul 06, 2018. FINDINGS: Lower chest: No acute abnormality. Hepatobiliary: Unchanged subcentimeter low-density lesion in the right liver, remain too small to characterize. Unchanged 9 mm gallstone. No gallbladder wall thickening or biliary dilatation. Pancreas: Unremarkable. No pancreatic ductal dilatation or surrounding inflammatory changes. Spleen: Normal in size without focal abnormality. Adrenals/Urinary Tract: Adrenal glands are unremarkable. Unchanged punctate calculi in the lower poles of both kidneys. Previously seen 9 mm calculus in the upper pole  of the left kidney is now in the proximal ureter with resultant mild left hydroureteronephrosis. There is also a new 3 mm calculus in the distal right ureter approximately 1 cm from the UVJ, without associated hydroureteronephrosis. The bladder is unremarkable. Stomach/Bowel: Unchanged small hiatal hernia. The stomach is otherwise within normal limits. No bowel wall thickening, distention, or surrounding inflammatory changes. Left-sided colonic diverticulosis again noted. Normal appendix. Vascular/Lymphatic: No significant vascular findings are present. No enlarged abdominal or pelvic lymph nodes. Reproductive: Status post hysterectomy. No adnexal masses. Other: No abdominal wall hernia or abnormality. No abdominopelvic ascites. No pneumoperitoneum. Musculoskeletal: No acute or significant osseous findings. IMPRESSION: 1. Previously seen 9 mm calculus in the upper pole of the left kidney is now in the proximal ureter with resultant mild left hydroureteronephrosis. 2. New 3 mm calculus in the distal right ureter approximately 1 cm from the UVJ, without associated hydroureteronephrosis. 3. Additional  bilateral nonobstructive nephrolithiasis. 4. Unchanged cholelithiasis. Electronically Signed   By: Titus Dubin M.D.   On: 07/31/2020 16:50   Labs on Admission: I have personally reviewed following labs  CBC: Recent Labs  Lab 07/31/20 1517  WBC 7.9  HGB 13.6  HCT 39.8  MCV 92.8  PLT 450   Basic Metabolic Panel: Recent Labs  Lab 07/31/20 1517  NA 139  K 4.1  CL 106  CO2 23  GLUCOSE 110*  BUN 15  CREATININE 1.05*  CALCIUM 9.3   GFR: Estimated Creatinine Clearance: 56.8 mL/min (A) (by C-G formula based on SCr of 1.05 mg/dL (H)).  Liver Function Tests: Recent Labs  Lab 07/31/20 1517  AST 21  ALT 13  ALKPHOS 73  BILITOT 0.8  PROT 7.0  ALBUMIN 4.4   Recent Labs  Lab 07/31/20 1517  LIPASE 50   Urine analysis:    Component Value Date/Time   COLORURINE STRAW (A) 07/31/2020 1718   APPEARANCEUR CLEAR (A) 07/31/2020 1718   LABSPEC 1.006 07/31/2020 1718   PHURINE 7.0 07/31/2020 1718   GLUCOSEU NEGATIVE 07/31/2020 1718   HGBUR MODERATE (A) 07/31/2020 1718   BILIRUBINUR NEGATIVE 07/31/2020 1718   KETONESUR 20 (A) 07/31/2020 1718   PROTEINUR NEGATIVE 07/31/2020 1718   NITRITE NEGATIVE 07/31/2020 1718   LEUKOCYTESUR NEGATIVE 07/31/2020 1718   Britlee Skolnik N Raekwan Spelman D.O. [facility]  If 7PM-7AM, please contact overnight-coverage provider If 7AM-7PM, please contact day coverage provider [email or web address]  07/31/2020, 6:28 PM

## 2020-07-31 NOTE — ED Triage Notes (Signed)
Pt is bent over holding her llq in triage, tearful and vomiting, states hx of diverticulitis but states that it has never been this bad before, reports pain intermittently for a week

## 2020-08-01 ENCOUNTER — Encounter
Admission: EM | Disposition: A | Discharge status: Home / Self Care | Payer: Self-pay | Source: Home / Self Care | Attending: Internal Medicine

## 2020-08-01 ENCOUNTER — Inpatient Hospital Stay: Payer: Medicare HMO

## 2020-08-01 ENCOUNTER — Encounter: Payer: Self-pay | Admitting: Internal Medicine

## 2020-08-01 ENCOUNTER — Inpatient Hospital Stay: Payer: Medicare HMO | Admitting: Anesthesiology

## 2020-08-01 DIAGNOSIS — N132 Hydronephrosis with renal and ureteral calculous obstruction: Secondary | ICD-10-CM | POA: Diagnosis present

## 2020-08-01 DIAGNOSIS — Z90710 Acquired absence of both cervix and uterus: Secondary | ICD-10-CM | POA: Diagnosis not present

## 2020-08-01 DIAGNOSIS — E669 Obesity, unspecified: Secondary | ICD-10-CM | POA: Diagnosis present

## 2020-08-01 DIAGNOSIS — F3181 Bipolar II disorder: Secondary | ICD-10-CM | POA: Diagnosis present

## 2020-08-01 DIAGNOSIS — Z7952 Long term (current) use of systemic steroids: Secondary | ICD-10-CM | POA: Diagnosis not present

## 2020-08-01 DIAGNOSIS — I959 Hypotension, unspecified: Secondary | ICD-10-CM

## 2020-08-01 DIAGNOSIS — N200 Calculus of kidney: Secondary | ICD-10-CM | POA: Diagnosis not present

## 2020-08-01 DIAGNOSIS — Z791 Long term (current) use of non-steroidal anti-inflammatories (NSAID): Secondary | ICD-10-CM | POA: Diagnosis not present

## 2020-08-01 DIAGNOSIS — Z833 Family history of diabetes mellitus: Secondary | ICD-10-CM | POA: Diagnosis not present

## 2020-08-01 DIAGNOSIS — F411 Generalized anxiety disorder: Secondary | ICD-10-CM | POA: Diagnosis present

## 2020-08-01 DIAGNOSIS — Z809 Family history of malignant neoplasm, unspecified: Secondary | ICD-10-CM | POA: Diagnosis not present

## 2020-08-01 DIAGNOSIS — Z888 Allergy status to other drugs, medicaments and biological substances status: Secondary | ICD-10-CM | POA: Diagnosis not present

## 2020-08-01 DIAGNOSIS — E039 Hypothyroidism, unspecified: Secondary | ICD-10-CM | POA: Diagnosis present

## 2020-08-01 DIAGNOSIS — Z87442 Personal history of urinary calculi: Secondary | ICD-10-CM | POA: Diagnosis not present

## 2020-08-01 DIAGNOSIS — R1010 Upper abdominal pain, unspecified: Secondary | ICD-10-CM | POA: Diagnosis not present

## 2020-08-01 DIAGNOSIS — Z818 Family history of other mental and behavioral disorders: Secondary | ICD-10-CM | POA: Diagnosis not present

## 2020-08-01 DIAGNOSIS — Z79890 Hormone replacement therapy (postmenopausal): Secondary | ICD-10-CM | POA: Diagnosis not present

## 2020-08-01 DIAGNOSIS — Z85828 Personal history of other malignant neoplasm of skin: Secondary | ICD-10-CM | POA: Diagnosis not present

## 2020-08-01 DIAGNOSIS — M3500 Sicca syndrome, unspecified: Secondary | ICD-10-CM | POA: Diagnosis present

## 2020-08-01 DIAGNOSIS — Z8249 Family history of ischemic heart disease and other diseases of the circulatory system: Secondary | ICD-10-CM | POA: Diagnosis not present

## 2020-08-01 DIAGNOSIS — I10 Essential (primary) hypertension: Secondary | ICD-10-CM | POA: Diagnosis present

## 2020-08-01 DIAGNOSIS — N201 Calculus of ureter: Secondary | ICD-10-CM | POA: Diagnosis present

## 2020-08-01 DIAGNOSIS — Z79899 Other long term (current) drug therapy: Secondary | ICD-10-CM | POA: Diagnosis not present

## 2020-08-01 DIAGNOSIS — R579 Shock, unspecified: Secondary | ICD-10-CM | POA: Diagnosis present

## 2020-08-01 DIAGNOSIS — Z20822 Contact with and (suspected) exposure to covid-19: Secondary | ICD-10-CM | POA: Diagnosis present

## 2020-08-01 DIAGNOSIS — M797 Fibromyalgia: Secondary | ICD-10-CM | POA: Diagnosis present

## 2020-08-01 DIAGNOSIS — Z823 Family history of stroke: Secondary | ICD-10-CM | POA: Diagnosis not present

## 2020-08-01 DIAGNOSIS — N3289 Other specified disorders of bladder: Secondary | ICD-10-CM | POA: Diagnosis present

## 2020-08-01 DIAGNOSIS — Z880 Allergy status to penicillin: Secondary | ICD-10-CM | POA: Diagnosis not present

## 2020-08-01 HISTORY — PX: CYSTOSCOPY WITH STENT PLACEMENT: SHX5790

## 2020-08-01 LAB — CBC
HCT: 34.2 % — ABNORMAL LOW (ref 36.0–46.0)
Hemoglobin: 11.3 g/dL — ABNORMAL LOW (ref 12.0–15.0)
MCH: 31 pg (ref 26.0–34.0)
MCHC: 33 g/dL (ref 30.0–36.0)
MCV: 94 fL (ref 80.0–100.0)
Platelets: 174 10*3/uL (ref 150–400)
RBC: 3.64 MIL/uL — ABNORMAL LOW (ref 3.87–5.11)
RDW: 13.2 % (ref 11.5–15.5)
WBC: 7.4 10*3/uL (ref 4.0–10.5)
nRBC: 0 % (ref 0.0–0.2)

## 2020-08-01 LAB — LACTIC ACID, PLASMA: Lactic Acid, Venous: 1.7 mmol/L (ref 0.5–1.9)

## 2020-08-01 LAB — PHOSPHORUS: Phosphorus: 4 mg/dL (ref 2.5–4.6)

## 2020-08-01 LAB — MAGNESIUM: Magnesium: 2 mg/dL (ref 1.7–2.4)

## 2020-08-01 LAB — BASIC METABOLIC PANEL
Anion gap: 5 (ref 5–15)
BUN: 15 mg/dL (ref 6–20)
CO2: 23 mmol/L (ref 22–32)
Calcium: 8.2 mg/dL — ABNORMAL LOW (ref 8.9–10.3)
Chloride: 111 mmol/L (ref 98–111)
Creatinine, Ser: 0.97 mg/dL (ref 0.44–1.00)
GFR, Estimated: >60 mL/min (ref >60)
Glucose, Bld: 99 mg/dL (ref 70–99)
Potassium: 3.4 mmol/L — ABNORMAL LOW (ref 3.5–5.1)
Sodium: 139 mmol/L (ref 135–145)

## 2020-08-01 LAB — PROCALCITONIN: Procalcitonin: <0.1 ng/mL

## 2020-08-01 LAB — GLUCOSE, CAPILLARY: Glucose-Capillary: 91 mg/dL (ref 70–99)

## 2020-08-01 LAB — MRSA PCR SCREENING: MRSA by PCR: NEGATIVE

## 2020-08-01 LAB — SARS CORONAVIRUS 2 (TAT 6-24 HRS): SARS Coronavirus 2: NEGATIVE

## 2020-08-01 LAB — HIV ANTIBODY (ROUTINE TESTING W REFLEX): HIV Screen 4th Generation wRfx: NONREACTIVE

## 2020-08-01 SURGERY — CYSTOSCOPY, WITH STENT INSERTION
Anesthesia: General | Laterality: Bilateral

## 2020-08-01 MED ORDER — SODIUM CHLORIDE 0.9 % IV BOLUS
1000.0000 mL | Freq: Once | INTRAVENOUS | Status: AC
  Administered 2020-08-01: 1000 mL via INTRAVENOUS

## 2020-08-01 MED ORDER — LACTATED RINGERS IV SOLN: INTRAVENOUS | Status: DC | PRN

## 2020-08-01 MED ORDER — PROPOFOL 10 MG/ML IV BOLUS
INTRAVENOUS | Status: DC | PRN
  Administered 2020-08-01: 120 mg via INTRAVENOUS

## 2020-08-01 MED ORDER — VANCOMYCIN HCL 1750 MG/350ML IV SOLN
1750.0000 mg | Freq: Once | INTRAVENOUS | Status: DC
  Filled 2020-08-01: qty 350

## 2020-08-01 MED ORDER — ONDANSETRON HCL 4 MG/2ML IJ SOLN
INTRAMUSCULAR | Status: DC | PRN
  Administered 2020-08-01: 4 mg via INTRAVENOUS

## 2020-08-01 MED ORDER — BELLADONNA ALKALOIDS-OPIUM 16.2-60 MG RE SUPP
1.0000 | Freq: Three times a day (TID) | RECTAL | Status: DC | PRN
  Filled 2020-08-01: qty 1

## 2020-08-01 MED ORDER — FENTANYL CITRATE (PF) 100 MCG/2ML IJ SOLN
INTRAMUSCULAR | Status: DC | PRN
  Administered 2020-08-01 (×2): 50 ug via INTRAVENOUS

## 2020-08-01 MED ORDER — SODIUM CHLORIDE 0.9 % IV BOLUS
250.0000 mL | Freq: Once | INTRAVENOUS | Status: AC
  Administered 2020-08-01: 250 mL via INTRAVENOUS

## 2020-08-01 MED ORDER — SODIUM CHLORIDE 0.9 % IV SOLN
250.0000 mL | INTRAVENOUS | Status: DC
  Administered 2020-08-01: 1000 mL via INTRAVENOUS

## 2020-08-01 MED ORDER — OXYBUTYNIN CHLORIDE 5 MG PO TABS
5.0000 mg | ORAL_TABLET | Freq: Three times a day (TID) | ORAL | Status: DC | PRN
  Filled 2020-08-01 (×2): qty 1

## 2020-08-01 MED ORDER — MEPERIDINE HCL 25 MG/ML IJ SOLN: 6.2500 mg | INTRAMUSCULAR | Status: DC | PRN

## 2020-08-01 MED ORDER — SODIUM CHLORIDE 0.9 % IV SOLN
2.0000 g | Freq: Three times a day (TID) | INTRAVENOUS | Status: DC
  Filled 2020-08-01 (×2): qty 2

## 2020-08-01 MED ORDER — MIDAZOLAM HCL 2 MG/2ML IJ SOLN
INTRAMUSCULAR | Status: AC
  Filled 2020-08-01: qty 2

## 2020-08-01 MED ORDER — MIDODRINE HCL 5 MG PO TABS
10.0000 mg | ORAL_TABLET | Freq: Three times a day (TID) | ORAL | Status: DC
  Administered 2020-08-01 – 2020-08-02 (×4): 10 mg via ORAL
  Filled 2020-08-01 (×4): qty 2

## 2020-08-01 MED ORDER — SUCCINYLCHOLINE CHLORIDE 20 MG/ML IJ SOLN
INTRAMUSCULAR | Status: DC | PRN
  Administered 2020-08-01: 100 mg via INTRAVENOUS

## 2020-08-01 MED ORDER — LACTATED RINGERS IV BOLUS
750.0000 mL | Freq: Once | INTRAVENOUS | Status: AC
  Administered 2020-08-01: 750 mL via INTRAVENOUS

## 2020-08-01 MED ORDER — LIDOCAINE HCL (CARDIAC) PF 100 MG/5ML IV SOSY
PREFILLED_SYRINGE | INTRAVENOUS | Status: DC | PRN
  Administered 2020-08-01: 60 mg via INTRAVENOUS

## 2020-08-01 MED ORDER — FENTANYL CITRATE (PF) 100 MCG/2ML IJ SOLN: 25.0000 ug | INTRAMUSCULAR | Status: DC | PRN

## 2020-08-01 MED ORDER — OXYCODONE HCL 5 MG PO TABS: 5.0000 mg | ORAL_TABLET | Freq: Once | ORAL | Status: DC | PRN

## 2020-08-01 MED ORDER — VANCOMYCIN HCL 1250 MG/250ML IV SOLN: 1250.0000 mg | INTRAVENOUS | Status: DC

## 2020-08-01 MED ORDER — FENTANYL CITRATE (PF) 100 MCG/2ML IJ SOLN
INTRAMUSCULAR | Status: AC
  Filled 2020-08-01: qty 2

## 2020-08-01 MED ORDER — CEFAZOLIN SODIUM-DEXTROSE 2-3 GM-%(50ML) IV SOLR
INTRAVENOUS | Status: DC | PRN
  Administered 2020-08-01: 1 g via INTRAVENOUS

## 2020-08-01 MED ORDER — LACTATED RINGERS IV SOLN: INTRAVENOUS | Status: DC

## 2020-08-01 MED ORDER — CHLORHEXIDINE GLUCONATE CLOTH 2 % EX PADS
6.0000 | MEDICATED_PAD | Freq: Every day | CUTANEOUS | Status: DC
  Administered 2020-08-02: 6 via TOPICAL

## 2020-08-01 MED ORDER — OXYCODONE HCL 5 MG/5ML PO SOLN: 5.0000 mg | Freq: Once | ORAL | Status: DC | PRN

## 2020-08-01 MED ORDER — POTASSIUM CHLORIDE CRYS ER 20 MEQ PO TBCR
40.0000 meq | EXTENDED_RELEASE_TABLET | Freq: Once | ORAL | Status: AC
  Administered 2020-08-01: 40 meq via ORAL
  Filled 2020-08-01: qty 2

## 2020-08-01 MED ORDER — DEXAMETHASONE SODIUM PHOSPHATE 10 MG/ML IJ SOLN
INTRAMUSCULAR | Status: DC | PRN
  Administered 2020-08-01: 8 mg via INTRAVENOUS

## 2020-08-01 MED ORDER — PROMETHAZINE HCL 25 MG/ML IJ SOLN: 6.2500 mg | INTRAMUSCULAR | Status: DC | PRN

## 2020-08-01 MED ORDER — NOREPINEPHRINE 4 MG/250ML-% IV SOLN
2.0000 ug/min | INTRAVENOUS | Status: DC
  Filled 2020-08-01: qty 250

## 2020-08-01 MED ORDER — PHENYLEPHRINE HCL (PRESSORS) 10 MG/ML IV SOLN
INTRAVENOUS | Status: DC | PRN
  Administered 2020-08-01: 200 ug via INTRAVENOUS
  Administered 2020-08-01 (×2): 100 ug via INTRAVENOUS

## 2020-08-01 MED ORDER — HYDROMORPHONE HCL 1 MG/ML IJ SOLN
0.5000 mg | INTRAMUSCULAR | Status: DC | PRN
  Administered 2020-08-01 (×2): 0.5 mg via INTRAVENOUS
  Filled 2020-08-01 (×2): qty 1

## 2020-08-01 MED ORDER — OXYCODONE HCL 5 MG PO TABS
5.0000 mg | ORAL_TABLET | ORAL | Status: DC | PRN
  Administered 2020-08-01 – 2020-08-02 (×4): 5 mg via ORAL
  Filled 2020-08-01 (×4): qty 1

## 2020-08-01 SURGICAL SUPPLY — 17 items
BAG DRAIN CYSTO-URO LG1000N (MISCELLANEOUS) ×2 IMPLANT
BRUSH SCRUB EZ 1% IODOPHOR (MISCELLANEOUS) ×2 IMPLANT
CATH URETL 5X70 OPEN END (CATHETERS) ×2 IMPLANT
DRAPE C-ARM XRAY 36X54 (DRAPES) ×2 IMPLANT
GLOVE SURG ENC MOIS LTX SZ6.5 (GLOVE) ×2 IMPLANT
GOWN STRL REUS W/ TWL LRG LVL3 (GOWN DISPOSABLE) ×2 IMPLANT
GOWN STRL REUS W/TWL LRG LVL3 (GOWN DISPOSABLE) ×4
GUIDEWIRE STR DUAL SENSOR (WIRE) ×2 IMPLANT
IV NS IRRIG 3000ML ARTHROMATIC (IV SOLUTION) ×2 IMPLANT
KIT TURNOVER CYSTO (KITS) ×2 IMPLANT
PACK CYSTO AR (MISCELLANEOUS) ×2 IMPLANT
SET CYSTO W/LG BORE CLAMP LF (SET/KITS/TRAYS/PACK) ×2 IMPLANT
STENT URET 6FRX24 CONTOUR (STENTS) ×4 IMPLANT
SURGILUBE 2OZ TUBE FLIPTOP (MISCELLANEOUS) ×2 IMPLANT
SYR TOOMEY IRRIG 70ML (MISCELLANEOUS) ×2
SYRINGE TOOMEY IRRIG 70ML (MISCELLANEOUS) ×1 IMPLANT
WATER STERILE IRR 1000ML POUR (IV SOLUTION) ×2 IMPLANT

## 2020-08-01 NOTE — Transfer of Care (Signed)
Immediate Anesthesia Transfer of Care Note  Patient: Stacy [name]  Procedure(s) Performed: CYSTOSCOPY WITH STENT PLACEMENT (Bilateral )  Patient Location: PACU  Anesthesia Type:General  Level of Consciousness: drowsy  Airway & Oxygen Therapy: Patient Spontanous Breathing and Patient connected to face mask oxygen  Post-op Assessment: Report given to RN  Post vital signs: stable  Last Vitals:  Vitals Value Taken Time  BP 111/57 08/01/20 1018  Temp    Pulse 88 08/01/20 1020  Resp 17 08/01/20 1020  SpO2 100 % 08/01/20 1020  Vitals shown include unvalidated device data.  Last Pain:  Vitals:   08/01/20 0400  TempSrc: Oral  PainSc: 1          Complications: No complications documented.

## 2020-08-01 NOTE — Progress Notes (Addendum)
Pt taken to OR. Pt returned, VSS, room air. Pt endorses pain in pelvic region, across anterior section. Toradol given x2, Dilaudid given x1 w/ relief.  Pt taking PO meds and eating post op.   Purewick in place, pt put out near 2L clear yellow urine post op.   Transfer orders in place for med surg.   BP stable post op. Pt taking PO midodrine. No need for IV levo today.

## 2020-08-01 NOTE — Anesthesia Preprocedure Evaluation (Signed)
Anesthesia Evaluation  Patient identified by MRN, date of birth, ID band Patient awake    Reviewed: Allergy & Precautions, NPO status , Patient's Chart, lab work & pertinent test results  History of Anesthesia Complications Negative for: history of anesthetic complications  Airway Mallampati: II  TM Distance: >3 FB Neck ROM: Full    Dental no notable dental hx.    Pulmonary neg pulmonary ROS, neg sleep apnea, neg COPD,    breath sounds clear to auscultation- rhonchi (-) wheezing      Cardiovascular Exercise Tolerance: Good (-) hypertension(-) CAD, (-) Past MI, (-) Cardiac Stents and (-) CABG  Rhythm:Regular Rate:Normal - Systolic murmurs and - Diastolic murmurs    Neuro/Psych  Headaches, neg Seizures PSYCHIATRIC DISORDERS Anxiety Depression Bipolar Disorder    GI/Hepatic Neg liver ROS, GERD  ,  Endo/Other  neg diabetesHypothyroidism   Renal/GU Renal disease: nephrolithiasis.     Musculoskeletal  (+) Arthritis , Fibromyalgia -  Abdominal (+) - obese,   Peds  Hematology negative hematology ROS (+)   Anesthesia Other Findings Past Medical History: No date: Aneurysm ([place]) No date: Anxiety No date: Arthritis No date: Brain stem lesion No date: Chronic kidney disease     Comment:  kideny stone No date: Depression No date: Fibromyalgia No date: GERD (gastroesophageal reflux disease) No date: Headache No date: Hypotension No date: Hypothyroid No date: Memory loss No date: Migraine No date: Raynaud disease No date: Sjogren's syndrome (HCC) No date: Thyroid disease   Reproductive/Obstetrics                             Anesthesia Physical Anesthesia Plan  ASA: III  Anesthesia Plan: General   Post-op Pain Management:    Induction: Intravenous  PONV Risk Score and Plan: 2 and Ondansetron and Dexamethasone  Airway Management Planned: Oral ETT  Additional Equipment:   Intra-op  Plan:   Post-operative Plan: Extubation in OR  Informed Consent: I have reviewed the patients History and Physical, chart, labs and discussed the procedure including the risks, benefits and alternatives for the proposed anesthesia with the patient or authorized representative who has indicated his/her understanding and acceptance.     Dental advisory given  Plan Discussed with: CRNA and Anesthesiologist  Anesthesia Plan Comments:         Anesthesia Quick Evaluation

## 2020-08-01 NOTE — H&P (View-Only) (Signed)
Urology Consult  I have been asked to see the patient by Dr. Priscella Mann, for evaluation and management of left renal colic with bilateral ureteral stone.  Chief Complaint: Left flank pain, nausea, vomiting, hypertension  History of Present Illness: Stacy [name] is a 60 y.o. year old female with PMH nephrolithiasis who presented to the ED yesterday with reports of a 1 week history of left flank pain, nausea, and vomiting.  CT stone study revealed an obstructing 9 mm proximal left ureteral stone as well as a nonobstructing 3 mm distal right ureteral stone.  Admission labs notable for lactate 1.7; WBC count 7.4; creatinine 0.97; and UA with 0-5 WBCs/hpf, 6-10 RBCs/hpf, no nitrites, and rare bacteria.  She developed hypotension overnight without tachycardia or fever and was transferred to stepdown with concerns for possible sepsis.  No cultures sent.  Today she reports slightly improved, though persistent left flank pain.  She reports a history of nephrolithiasis approximately 15 years ago requiring intervention.  She states she previously tolerated stents without difficulty.  She has been n.p.o. since midnight.  Upon patient request, I subsequently spoke with her husband via telephone.  He reports that she has a history of sensitivities to narcotic pain medications, and commonly develops hypotension in response to these.  Past Medical History:  Diagnosis Date  . Aneurysm ([place])   . Anxiety   . Arthritis   . Brain stem lesion   . Chronic kidney disease    kideny stone  . Depression   . Fibromyalgia   . GERD (gastroesophageal reflux disease)   . Headache   . Hypotension   . Hypothyroid   . Memory loss   . Migraine   . Raynaud disease   . Sjogren's syndrome ([place])   . Thyroid disease     Past Surgical History:  Procedure Laterality Date  . ABDOMINAL HYSTERECTOMY    . BASAL CELL CARCINOMA EXCISION    . BREAST BIOPSY    . CESAREAN SECTION    . EYE SURGERY    . KNEE SURGERY Left    . LITHOTRIPSY    . MUSCLE BIOPSY    . OOPHORECTOMY      Home Medications:  Current Meds  Medication Sig  . Ascorbic Acid (VITAMIN C) 100 MG tablet Take 100 mg by mouth daily.  . busPIRone (BUSPAR) 10 MG tablet Take 2 tablets (20 mg total) by mouth 2 (two) times daily.  . Cholecalciferol (VITAMIN D-3) 1000 units CAPS Take 1,000 Units by mouth daily.  Marland Kitchen donepezil (ARICEPT) 10 MG tablet Take 10 mg by mouth every morning.  . gabapentin (NEURONTIN) 300 MG capsule Take 1 capsule by mouth two times daily (Patient taking differently: Take 300 mg by mouth 2 (two) times daily.)  . hydroxychloroquine (PLAQUENIL) 200 MG tablet Take 200 mg by mouth 2 (two) times daily.  Marland Kitchen levothyroxine (SYNTHROID, LEVOTHROID) 175 MCG tablet Take 1 tablet (175 mcg total) by mouth daily before breakfast.  . Multiple Vitamin (MULTI-VITAMIN) tablet Take 1 tablet by mouth daily.  . Multiple Vitamins-Minerals (PRESERVISION AREDS) CAPS Take 1 capsule by mouth daily at 12 noon.  Marland Kitchen omeprazole (PRILOSEC) 40 MG capsule Take 40 mg by mouth daily.  . vitamin E 45 MG (100 UNITS) capsule Take 100 Units by mouth daily.    Allergies:  Allergies  Allergen Reactions  . Penicillins Hives  . Amoxicillin Hives  . Depakote [Divalproex Sodium]     vomiting  . Lamictal [Lamotrigine]     Side effects of  abdominal cramps, nausea , diarrhea ,nightmares    Family History  Problem Relation Age of Onset  . Migraines Mother   . Anxiety disorder Mother   . Depression Mother   . Cancer Father   . Hypertension Father   . Stroke Father   . Heart attack Father   . Alcohol abuse Father   . Diabetes Brother   . Alcohol abuse Sister   . Drug abuse Sister   . Anxiety disorder Sister   . Depression Sister   . Drug abuse Brother   . Cancer Paternal Aunt   . Lupus Paternal Aunt   . Thyroid disease Paternal Aunt   . Diabetes Maternal Grandfather   . Anxiety disorder Maternal Grandmother   . Depression Maternal Grandmother      Social History:  reports that she has never smoked. She has never used smokeless tobacco. She reports that she does not drink alcohol and does not use drugs.  ROS: A complete review of systems was performed.  All systems are negative except for pertinent findings as noted.  Physical Exam:  Vital signs in last 24 hours: Temp:  [97.8 F (36.6 C)-98.8 F (37.1 C)] 98 F (36.7 C) (06/07 0400) Pulse Rate:  [65-88] 71 (06/07 0800) Resp:  [11-27] 15 (06/07 0800) BP: (78-121)/(39-78) 97/56 (06/07 0800) SpO2:  [97 %-100 %] 100 % (06/07 0800) Weight:  [71.2 kg-74.5 kg] 74.5 kg (06/07 0400) Constitutional:  Alert and oriented, uncomfortable appearing, laying flat in bed HEENT: [place] AT, moist mucus membranes Cardiovascular: No clubbing, cyanosis, or edema Respiratory: Normal respiratory effort Skin: No rashes, bruises or suspicious lesions Neurologic: Grossly intact, no focal deficits, moving all 4 extremities Psychiatric: Normal mood and affect  Laboratory Data:  Recent Labs    07/31/20 1517 08/01/20 0416  WBC 7.9 7.4  HGB 13.6 11.3*  HCT 39.8 34.2*   Recent Labs    07/31/20 1517 08/01/20 0416  NA 139 139  K 4.1 3.4*  CL 106 111  CO2 23 23  GLUCOSE 110* 99  BUN 15 15  CREATININE 1.05* 0.97  CALCIUM 9.3 8.2*   Urinalysis    Component Value Date/Time   COLORURINE STRAW (A) 07/31/2020 1718   APPEARANCEUR CLEAR (A) 07/31/2020 1718   LABSPEC 1.006 07/31/2020 1718   PHURINE 7.0 07/31/2020 1718   GLUCOSEU NEGATIVE 07/31/2020 1718   HGBUR MODERATE (A) 07/31/2020 1718   BILIRUBINUR NEGATIVE 07/31/2020 1718   KETONESUR 20 (A) 07/31/2020 1718   PROTEINUR NEGATIVE 07/31/2020 1718   NITRITE NEGATIVE 07/31/2020 1718   LEUKOCYTESUR NEGATIVE 07/31/2020 1718   Results for orders placed or performed during the hospital encounter of 07/31/20  SARS CORONAVIRUS 2 (TAT 6-24 HRS) Nasopharyngeal Nasopharyngeal Swab     Status: None   Collection Time: 07/31/20  5:55 PM   Specimen:  Nasopharyngeal Swab  Result Value Ref Range Status   SARS Coronavirus 2 NEGATIVE NEGATIVE Final    Comment: (NOTE) SARS-CoV-2 target nucleic acids are NOT DETECTED.  The SARS-CoV-2 RNA is generally detectable in upper and lower respiratory specimens during the acute phase of infection. Negative results do not preclude SARS-CoV-2 infection, do not rule out co-infections with other pathogens, and should not be used as the sole basis for treatment or other patient management decisions. Negative results must be combined with clinical observations, patient history, and epidemiological information. The expected result is Negative.  Fact Sheet for Patients: SugarRoll.be  Fact Sheet for Healthcare Providers: https://www.woods-mathews.com/  This test is not yet approved or  cleared by the [place] and  has been authorized for detection and/or diagnosis of SARS-CoV-2 by FDA under an Emergency Use Authorization (EUA). This EUA will remain  in effect (meaning this test can be used) for the duration of the COVID-19 declaration under Se ction 564(b)(1) of the Act, 21 U.S.C. section 360bbb-3(b)(1), unless the authorization is terminated or revoked sooner.  Performed at [facility], [place] [address] [place], [place] [address]   MRSA PCR Screening     Status: None   Collection Time: 08/01/20  4:15 AM   Specimen: Nasal Mucosa; Nasopharyngeal  Result Value Ref Range Status   MRSA by PCR NEGATIVE NEGATIVE Final    Comment:        The GeneXpert MRSA Assay (FDA approved for NASAL specimens only), is one component of a comprehensive MRSA colonization surveillance program. It is not intended to diagnose MRSA infection nor to guide or monitor treatment for MRSA infections. Performed at [facility], [address], [place], [place] [address]     Radiologic Imaging: CT Renal Stone Study  Result Date: 07/31/2020 CLINICAL DATA:   Intermittent left lower quadrant abdominal pain for the past week. EXAM: CT ABDOMEN AND PELVIS WITHOUT CONTRAST TECHNIQUE: Multidetector CT imaging of the abdomen and pelvis was performed following the standard protocol without IV contrast. COMPARISON:  CT abdomen pelvis dated Jul 06, 2018. FINDINGS: Lower chest: No acute abnormality. Hepatobiliary: Unchanged subcentimeter low-density lesion in the right liver, remain too small to characterize. Unchanged 9 mm gallstone. No gallbladder wall thickening or biliary dilatation. Pancreas: Unremarkable. No pancreatic ductal dilatation or surrounding inflammatory changes. Spleen: Normal in size without focal abnormality. Adrenals/Urinary Tract: Adrenal glands are unremarkable. Unchanged punctate calculi in the lower poles of both kidneys. Previously seen 9 mm calculus in the upper pole of the left kidney is now in the proximal ureter with resultant mild left hydroureteronephrosis. There is also a new 3 mm calculus in the distal right ureter approximately 1 cm from the UVJ, without associated hydroureteronephrosis. The bladder is unremarkable. Stomach/Bowel: Unchanged small hiatal hernia. The stomach is otherwise within normal limits. No bowel wall thickening, distention, or surrounding inflammatory changes. Left-sided colonic diverticulosis again noted. Normal appendix. Vascular/Lymphatic: No significant vascular findings are present. No enlarged abdominal or pelvic lymph nodes. Reproductive: Status post hysterectomy. No adnexal masses. Other: No abdominal wall hernia or abnormality. No abdominopelvic ascites. No pneumoperitoneum. Musculoskeletal: No acute or significant osseous findings. IMPRESSION: 1. Previously seen 9 mm calculus in the upper pole of the left kidney is now in the proximal ureter with resultant mild left hydroureteronephrosis. 2. New 3 mm calculus in the distal right ureter approximately 1 cm from the UVJ, without associated hydroureteronephrosis. 3.  Additional bilateral nonobstructive nephrolithiasis. 4. Unchanged cholelithiasis. Electronically Signed   By: Titus Dubin M.D.   On: 07/31/2020 16:50   Assessment & Plan:  60 year old female with PMH nephrolithiasis admitted with left renal colic associated with an obstructing 9 mm proximal left ureteral stone as well as an incidental 3 mm distal right ureteral stone.  She developed hypotension overnight concerning for sepsis.  With normal labs, benign UA, no tachycardia, no fever, and a history of hypotension in response to narcotic pain medications, hypotension is more likely a medication side effect rather than a sign of sepsis.  Regardless, recommend bilateral ureteral stent placement for management of her severe left renal colic and possible source control of urinary infection.  We discussed proceeding with bilateral ureteral stent placement  with Dr. Erlene Quan today.  We discussed that she will require follow-up ureteroscopy with laser lithotripsy and stent exchange in approximately 2 weeks.  We discussed that stents may cause flank and/or bladder discomfort as well as gross hematuria and that we can manage discomfort pharmacologically.  Patient is in agreement with this plan and wishes to proceed.  Recommendations: -Cystoscopy and bilateral ureteral stent placement with Dr. Erlene Quan this morning -If pain is well controlled and no evidence of infection intraoperatively, okay for discharge this afternoon -If signs of infection intraoperatively, will need to remain inpatient for IV antibiotics -Informed consent order placed  Thank you for involving me in this patient's care, I will continue to follow along. Debroah Loop, PA-C 08/01/2020 8:50 AM

## 2020-08-01 NOTE — Anesthesia Procedure Notes (Signed)
Procedure Name: Intubation Date/Time: 08/01/2020 9:49 AM Performed by: Lerry Liner, CRNA Pre-anesthesia Checklist: Patient identified, Emergency Drugs available, Suction available and Patient being monitored Patient Re-evaluated:Patient Re-evaluated prior to induction Oxygen Delivery Method: Circle system utilized Preoxygenation: Pre-oxygenation with 100% oxygen Induction Type: IV induction Ventilation: Mask ventilation without difficulty Laryngoscope Size: McGraph and 3 Grade View: Grade I Tube type: Oral Tube size: 7.0 mm Number of attempts: 1 Airway Equipment and Method: Stylet and Oral airway Placement Confirmation: ETT inserted through vocal cords under direct vision,  positive ETCO2 and breath sounds checked- equal and bilateral Secured at: 22 cm Tube secured with: Tape Dental Injury: Teeth and Oropharynx as per pre-operative assessment

## 2020-08-01 NOTE — Op Note (Signed)
Date of procedure: 08/01/20  Preoperative diagnosis:  1. Bilateral ureteral calculi 2. Poorly controlled flank pain 3. Hypotension   Postoperative diagnosis:  1. Same as above  Procedure: 1. Cystoscopy 2. Bilateral ureteral stent placement  Surgeon: Hollice Espy, MD  Anesthesia: General  Complications: None  Intraoperative findings: Large 9 mm proximal mid ureteral calculus easily seen on fluoroscopy, stent placed without difficulty with obstructive diuresis appreciated upon placement without purulence.  Right ureteral stent also placed as precaution in setting of small distal nonobstructing stone.  EBL: Minimal  Specimens: None  Drains: 6 x 24 French double-J ureteral stent bilaterally  Indication: Stacy [name] is a 60 y.o. patient with 9 mm left proximal ureteral calculus was obstructing with poorly controlled pain as well as an incidental 3 mm right distal ureteral calculus.  Overnight, she developed isolated hypotension without other signs or symptoms of sepsis.  For pain control, as well as possible source control if this is a cause of her hypotension, she elected for bilateral ureteral stent placement.  Please see H&P.  After reviewing the management options for treatment, she elected to proceed with the above surgical procedure(s). We have discussed the potential benefits and risks of the procedure, side effects of the proposed treatment, the likelihood of the patient achieving the goals of the procedure, and any potential problems that might occur during the procedure or recuperation. Informed consent has been obtained.  Description of procedure:  The patient was taken to the operating room and general anesthesia was induced.  The patient was placed in the dorsal lithotomy position, prepped and draped in the usual sterile fashion, and preoperative antibiotics were administered. A preoperative time-out was performed.   A 21 French scope was advanced per urethra into the  bladder.  On scout imaging, the left proximal ureteral calculus was easily seen.  Attention was turned to the left ureteral orifice.  A sensor wire was advanced alongside of the stone fluoroscopically up to level the kidney.  I elected not to shoot a retrograde pyelogram as a wire advanced quite easily and then curled at the expected level of the kidney in light of contrast shortage.  I then advanced a stent over the wire up to the level of the kidney.  The wire withdrawn and a curl was noted at the level of the kidney along with a full curl in the bladder.  Same as technique was used on the right.  The distal stone was not easily seen on scout imaging but the wire did advance quite easily and curled at the expected level of the kidney.  The stent was placed without difficulty and upon wire withdraw, it appeared to be in adequate position slightly lower than the left side which was anatomically appropriate.  Finally, the scope was removed and the bladder was drained.  The patient was cleaned and dried, repositioned in supine position, reversed from anesthesia, and taken to the PACU in stable condition.  Plan: We will plan for definitive management of her stones likely next week.  She will return to the ICU for continued supportive care and can be discharged once her blood pressure normalizes and her pain is controlled.  Urology will continue to follow along.  Hollice Espy, M.D.

## 2020-08-01 NOTE — Progress Notes (Signed)
PROGRESS NOTE    Stacy [name]  ZOX:[id number] DOB: February 27, 1960 DOA: 07/31/2020 PCP: [facility]   Brief Narrative:  60 y.o. female with medical history significant for cognitive impairment, bipolar type II, generalized anxiety disorder, presents to the emergency department for chief concerns of abdominal pain.  She reports that the pain started at approximately 1 pm today, sharp, persistent, at 10/10. She reports that currently the pain is 8/10 after pain medication. She denies fever, and states the pain is similar to previous episodes of kidney stones.   6/7: Overnight patient was transferred to stepdown unit and critical care consultation requested for persistent hypotension.  No clinical evidence of shock physiology.  Patient maintained alertness and was fever free without leukocytosis.  Later in the day patient underwent cystoscopy with bilateral ureteral stent placement with urology.  Found large 9 mm proximal mid ureteral calculus with stent placed without difficulty.  Postobstructive diuresis appreciated on placement without purulence.   Assessment & Plan:   Principal Problem:   Abdominal pain Active Problems:   Calculus of kidney   Fibromyalgia   Major depressive disorder, recurrent episode, in partial remission with anxious distress (HCC)   Sjogren's syndrome (HCC)   Acquired hypothyroidism   Bipolar 2 disorder (HCC)   GAD (generalized anxiety disorder)   Shock ([place])   Ureterolithiasis  Acute abdominal pain secondary to obstructive renal calculus Patient underwent cystoscopy and ureteral stent placement Had some significant postprocedural pain Per urology thought to be due to bladder spasms No evidence of infection or sepsis Plan: Multimodal pain control Intravenous fluids Vitals per unit protocol Patient may be discharged once pain and blood pressure improved  Hypotension Transferred to stepdown unit and critical care consultation called Midodrine 10mg   3 times daily started No intravenous vasopressors indicated Patient's blood pressure improved post procedurally Plan: Transfer to MedSurg Continue midodrine 10 mg p.o. 3 times daily for now, can likely de-escalate or discontinue soon Vitals per unit protocol  History of migraines PTA Imitrex 100 mg every 2 hours as needed  Anxiety Hydroxyzine 25 mg as needed Clonazepam 0.5 mg twice daily as needed  Hypothyroidism Resume home Synthroid  Cognitive impairment Resume home donepezil  Depression BuSpar  Sjogren syndrome Hydroxychloroquine 200 mg p.o. twice daily  GERD PPI  Neuropathy PTA gabapentin   DVT prophylaxis: SCDs Code Status: Full Family Communication: None today.  Offered to call but patient declined Disposition Plan: Status is: Inpatient  Remains inpatient appropriate because:Inpatient level of care appropriate due to severity of illness   Dispo: The patient is from: Home              Anticipated d/c is to: Home              Patient currently is not medically stable to d/c.   Difficult to place patient No  Renal calculus status post ureteral stent placement.  Patient will need outpatient follow-up with urology for definitive stone treatment.  However if pain control improved and blood pressure stable patient may be able to discharge home on 6/8 with outpatient follow-up with urology      Level of care: Med-Surg  Consultants:   Urology  Procedures:   Cystoscopy with ureteral stent placement  Antimicrobials:   None   Subjective: Seen and examined.  Post procedurally patient was endorsing severe pain in lower abdomen.  Objective: Vitals:   08/01/20 1300 08/01/20 1327 08/01/20 1400 08/01/20 1500  BP: (!) 104/53  113/64 116/67  Pulse: 74 70 70 67  Resp: (!) 24 (!) 27 (!) 21 13  Temp:      TempSrc:      SpO2: 100% 99% 97% 96%  Weight:      Height:        Intake/Output Summary (Last 24 hours) at 08/01/2020 1555 Last data filed at  08/01/2020 1300 Gross per 24 hour  Intake 4399.41 ml  Output 1800 ml  Net 2599.41 ml   Filed Weights   07/31/20 1508 08/01/20 0400  Weight: 71.2 kg 74.5 kg    Examination:  General exam: Mild distress due to pain Respiratory system: Clear to auscultation. Respiratory effort normal. Cardiovascular system: S1 & S2 heard, RRR. No JVD, murmurs, rubs, gallops or clicks. No pedal edema. Gastrointestinal system: Soft, nondistended, tender to palpation suprapubic region.  Normal bowel sounds Central nervous system: Alert and oriented. No focal neurological deficits. Extremities: Symmetric 5 x 5 power. Skin: No rashes, lesions or ulcers Psychiatry: Judgement and insight appear normal. Mood & affect appropriate.     Data Reviewed: I have personally reviewed following labs and imaging studies  CBC: Recent Labs  Lab 07/31/20 1517 08/01/20 0416  WBC 7.9 7.4  HGB 13.6 11.3*  HCT 39.8 34.2*  MCV 92.8 94.0  PLT 211 176   Basic Metabolic Panel: Recent Labs  Lab 07/31/20 1517 08/01/20 0416  NA 139 139  K 4.1 3.4*  CL 106 111  CO2 23 23  GLUCOSE 110* 99  BUN 15 15  CREATININE 1.05* 0.97  CALCIUM 9.3 8.2*  MG  --  2.0  PHOS  --  4.0   GFR: Estimated Creatinine Clearance: 66.7 mL/min (by C-G formula based on SCr of 0.97 mg/dL). Liver Function Tests: Recent Labs  Lab 07/31/20 1517  AST 21  ALT 13  ALKPHOS 73  BILITOT 0.8  PROT 7.0  ALBUMIN 4.4   Recent Labs  Lab 07/31/20 1517  LIPASE 50   No results for input(s): AMMONIA in the last 168 hours. Coagulation Profile: No results for input(s): INR, PROTIME in the last 168 hours. Cardiac Enzymes: No results for input(s): CKTOTAL, CKMB, CKMBINDEX, TROPONINI in the last 168 hours. BNP (last 3 results) No results for input(s): PROBNP in the last 8760 hours. HbA1C: No results for input(s): HGBA1C in the last 72 hours. CBG: Recent Labs  Lab 08/01/20 0357  GLUCAP 91   Lipid Profile: No results for input(s): CHOL,  HDL, LDLCALC, TRIG, CHOLHDL, LDLDIRECT in the last 72 hours. Thyroid Function Tests: No results for input(s): TSH, T4TOTAL, FREET4, T3FREE, THYROIDAB in the last 72 hours. Anemia Panel: No results for input(s): VITAMINB12, FOLATE, FERRITIN, TIBC, IRON, RETICCTPCT in the last 72 hours. Sepsis Labs: Recent Labs  Lab 08/01/20 0416  PROCALCITON <0.10  LATICACIDVEN 1.7    Recent Results (from the past 240 hour(s))  SARS CORONAVIRUS 2 (TAT 6-24 HRS) Nasopharyngeal Nasopharyngeal Swab     Status: None   Collection Time: 07/31/20  5:55 PM   Specimen: Nasopharyngeal Swab  Result Value Ref Range Status   SARS Coronavirus 2 NEGATIVE NEGATIVE Final    Comment: (NOTE) SARS-CoV-2 target nucleic acids are NOT DETECTED.  The SARS-CoV-2 RNA is generally detectable in upper and lower respiratory specimens during the acute phase of infection. Negative results do not preclude SARS-CoV-2 infection, do not rule out co-infections with other pathogens, and should not be used as the sole basis for treatment or other patient management decisions. Negative results must be combined with clinical observations, patient history, and epidemiological information. The expected  result is Negative.  Fact Sheet for Patients: SugarRoll.be  Fact Sheet for Healthcare Providers: https://www.woods-mathews.com/  This test is not yet approved or cleared by the [place] FDA and  has been authorized for detection and/or diagnosis of SARS-CoV-2 by FDA under an Emergency Use Authorization (EUA). This EUA will remain  in effect (meaning this test can be used) for the duration of the COVID-19 declaration under Se ction 564(b)(1) of the Act, 21 U.S.C. section 360bbb-3(b)(1), unless the authorization is terminated or revoked sooner.  Performed at [facility], [place] [address] [place], [place] [address]   MRSA PCR Screening     Status: None   Collection Time: 08/01/20   4:15 AM   Specimen: Nasal Mucosa; Nasopharyngeal  Result Value Ref Range Status   MRSA by PCR NEGATIVE NEGATIVE Final    Comment:        The GeneXpert MRSA Assay (FDA approved for NASAL specimens only), is one component of a comprehensive MRSA colonization surveillance program. It is not intended to diagnose MRSA infection nor to guide or monitor treatment for MRSA infections. Performed at [facility], [address], [place], [place] [address]          Radiology Studies: DG C-Arm 1-60 Min-No Report  Result Date: 08/01/2020 Fluoroscopy was utilized by the requesting physician.  No radiographic interpretation.   CT Renal Stone Study  Result Date: 07/31/2020 CLINICAL DATA:  Intermittent left lower quadrant abdominal pain for the past week. EXAM: CT ABDOMEN AND PELVIS WITHOUT CONTRAST TECHNIQUE: Multidetector CT imaging of the abdomen and pelvis was performed following the standard protocol without IV contrast. COMPARISON:  CT abdomen pelvis dated Jul 06, 2018. FINDINGS: Lower chest: No acute abnormality. Hepatobiliary: Unchanged subcentimeter low-density lesion in the right liver, remain too small to characterize. Unchanged 9 mm gallstone. No gallbladder wall thickening or biliary dilatation. Pancreas: Unremarkable. No pancreatic ductal dilatation or surrounding inflammatory changes. Spleen: Normal in size without focal abnormality. Adrenals/Urinary Tract: Adrenal glands are unremarkable. Unchanged punctate calculi in the lower poles of both kidneys. Previously seen 9 mm calculus in the upper pole of the left kidney is now in the proximal ureter with resultant mild left hydroureteronephrosis. There is also a new 3 mm calculus in the distal right ureter approximately 1 cm from the UVJ, without associated hydroureteronephrosis. The bladder is unremarkable. Stomach/Bowel: Unchanged small hiatal hernia. The stomach is otherwise within normal limits. No bowel wall thickening,  distention, or surrounding inflammatory changes. Left-sided colonic diverticulosis again noted. Normal appendix. Vascular/Lymphatic: No significant vascular findings are present. No enlarged abdominal or pelvic lymph nodes. Reproductive: Status post hysterectomy. No adnexal masses. Other: No abdominal wall hernia or abnormality. No abdominopelvic ascites. No pneumoperitoneum. Musculoskeletal: No acute or significant osseous findings. IMPRESSION: 1. Previously seen 9 mm calculus in the upper pole of the left kidney is now in the proximal ureter with resultant mild left hydroureteronephrosis. 2. New 3 mm calculus in the distal right ureter approximately 1 cm from the UVJ, without associated hydroureteronephrosis. 3. Additional bilateral nonobstructive nephrolithiasis. 4. Unchanged cholelithiasis. Electronically Signed   By: Titus Dubin M.D.   On: 07/31/2020 16:50        Scheduled Meds: . ascorbic acid  250 mg Oral Daily  . busPIRone  20 mg Oral BID  . Chlorhexidine Gluconate Cloth  6 each Topical Daily  . cholecalciferol  1,000 Units Oral Daily  . donepezil  10 mg Oral q morning  . gabapentin  300 mg Oral BID  .  hydroxychloroquine  200 mg Oral BID  . levothyroxine  175 mcg Oral Q0600  . midodrine  10 mg Oral TID WC  . pantoprazole  80 mg Oral Daily   Continuous Infusions: . sodium chloride 1,000 mL (08/01/20 1045)  . lactated ringers 100 mL/hr at 08/01/20 0602     LOS: 0 days    Time spent: 25 minutes    Sidney Ace, MD [facility] Pager [phone]  If 7PM-7AM, please contact night-coverage 08/01/2020, 3:55 PM

## 2020-08-01 NOTE — Consult Note (Addendum)
Urology Consult  I have been asked to see the patient by Dr. Priscella Mann, for evaluation and management of left renal colic with bilateral ureteral stone.  Chief Complaint: Left flank pain, nausea, vomiting, hypertension  History of Present Illness: Stacy [name] is a 60 y.o. year old female with PMH nephrolithiasis who presented to the ED yesterday with reports of a 1 week history of left flank pain, nausea, and vomiting.  CT stone study revealed an obstructing 9 mm proximal left ureteral stone as well as a nonobstructing 3 mm distal right ureteral stone.  Admission labs notable for lactate 1.7; WBC count 7.4; creatinine 0.97; and UA with 0-5 WBCs/hpf, 6-10 RBCs/hpf, no nitrites, and rare bacteria.  She developed hypotension overnight without tachycardia or fever and was transferred to stepdown with concerns for possible sepsis.  No cultures sent.  Today she reports slightly improved, though persistent left flank pain.  She reports a history of nephrolithiasis approximately 15 years ago requiring intervention.  She states she previously tolerated stents without difficulty.  She has been n.p.o. since midnight.  Upon patient request, I subsequently spoke with her husband via telephone.  He reports that she has a history of sensitivities to narcotic pain medications, and commonly develops hypotension in response to these.  Past Medical History:  Diagnosis Date  . Aneurysm ([place])   . Anxiety   . Arthritis   . Brain stem lesion   . Chronic kidney disease    kideny stone  . Depression   . Fibromyalgia   . GERD (gastroesophageal reflux disease)   . Headache   . Hypotension   . Hypothyroid   . Memory loss   . Migraine   . Raynaud disease   . Sjogren's syndrome ([place])   . Thyroid disease     Past Surgical History:  Procedure Laterality Date  . ABDOMINAL HYSTERECTOMY    . BASAL CELL CARCINOMA EXCISION    . BREAST BIOPSY    . CESAREAN SECTION    . EYE SURGERY    . KNEE SURGERY Left    . LITHOTRIPSY    . MUSCLE BIOPSY    . OOPHORECTOMY      Home Medications:  Current Meds  Medication Sig  . Ascorbic Acid (VITAMIN C) 100 MG tablet Take 100 mg by mouth daily.  . busPIRone (BUSPAR) 10 MG tablet Take 2 tablets (20 mg total) by mouth 2 (two) times daily.  . Cholecalciferol (VITAMIN D-3) 1000 units CAPS Take 1,000 Units by mouth daily.  Marland Kitchen donepezil (ARICEPT) 10 MG tablet Take 10 mg by mouth every morning.  . gabapentin (NEURONTIN) 300 MG capsule Take 1 capsule by mouth two times daily (Patient taking differently: Take 300 mg by mouth 2 (two) times daily.)  . hydroxychloroquine (PLAQUENIL) 200 MG tablet Take 200 mg by mouth 2 (two) times daily.  Marland Kitchen levothyroxine (SYNTHROID, LEVOTHROID) 175 MCG tablet Take 1 tablet (175 mcg total) by mouth daily before breakfast.  . Multiple Vitamin (MULTI-VITAMIN) tablet Take 1 tablet by mouth daily.  . Multiple Vitamins-Minerals (PRESERVISION AREDS) CAPS Take 1 capsule by mouth daily at 12 noon.  Marland Kitchen omeprazole (PRILOSEC) 40 MG capsule Take 40 mg by mouth daily.  . vitamin E 45 MG (100 UNITS) capsule Take 100 Units by mouth daily.    Allergies:  Allergies  Allergen Reactions  . Penicillins Hives  . Amoxicillin Hives  . Depakote [Divalproex Sodium]     vomiting  . Lamictal [Lamotrigine]     Side effects of  abdominal cramps, nausea , diarrhea ,nightmares    Family History  Problem Relation Age of Onset  . Migraines Mother   . Anxiety disorder Mother   . Depression Mother   . Cancer Father   . Hypertension Father   . Stroke Father   . Heart attack Father   . Alcohol abuse Father   . Diabetes Brother   . Alcohol abuse Sister   . Drug abuse Sister   . Anxiety disorder Sister   . Depression Sister   . Drug abuse Brother   . Cancer Paternal Aunt   . Lupus Paternal Aunt   . Thyroid disease Paternal Aunt   . Diabetes Maternal Grandfather   . Anxiety disorder Maternal Grandmother   . Depression Maternal Grandmother      Social History:  reports that she has never smoked. She has never used smokeless tobacco. She reports that she does not drink alcohol and does not use drugs.  ROS: A complete review of systems was performed.  All systems are negative except for pertinent findings as noted.  Physical Exam:  Vital signs in last 24 hours: Temp:  [97.8 F (36.6 C)-98.8 F (37.1 C)] 98 F (36.7 C) (06/07 0400) Pulse Rate:  [65-88] 71 (06/07 0800) Resp:  [11-27] 15 (06/07 0800) BP: (78-121)/(39-78) 97/56 (06/07 0800) SpO2:  [97 %-100 %] 100 % (06/07 0800) Weight:  [71.2 kg-74.5 kg] 74.5 kg (06/07 0400) Constitutional:  Alert and oriented, uncomfortable appearing, laying flat in bed HEENT: [place] AT, moist mucus membranes Cardiovascular: No clubbing, cyanosis, or edema Respiratory: Normal respiratory effort Skin: No rashes, bruises or suspicious lesions Neurologic: Grossly intact, no focal deficits, moving all 4 extremities Psychiatric: Normal mood and affect  Laboratory Data:  Recent Labs    07/31/20 1517 08/01/20 0416  WBC 7.9 7.4  HGB 13.6 11.3*  HCT 39.8 34.2*   Recent Labs    07/31/20 1517 08/01/20 0416  NA 139 139  K 4.1 3.4*  CL 106 111  CO2 23 23  GLUCOSE 110* 99  BUN 15 15  CREATININE 1.05* 0.97  CALCIUM 9.3 8.2*   Urinalysis    Component Value Date/Time   COLORURINE STRAW (A) 07/31/2020 1718   APPEARANCEUR CLEAR (A) 07/31/2020 1718   LABSPEC 1.006 07/31/2020 1718   PHURINE 7.0 07/31/2020 1718   GLUCOSEU NEGATIVE 07/31/2020 1718   HGBUR MODERATE (A) 07/31/2020 1718   BILIRUBINUR NEGATIVE 07/31/2020 1718   KETONESUR 20 (A) 07/31/2020 1718   PROTEINUR NEGATIVE 07/31/2020 1718   NITRITE NEGATIVE 07/31/2020 1718   LEUKOCYTESUR NEGATIVE 07/31/2020 1718   Results for orders placed or performed during the hospital encounter of 07/31/20  SARS CORONAVIRUS 2 (TAT 6-24 HRS) Nasopharyngeal Nasopharyngeal Swab     Status: None   Collection Time: 07/31/20  5:55 PM   Specimen:  Nasopharyngeal Swab  Result Value Ref Range Status   SARS Coronavirus 2 NEGATIVE NEGATIVE Final    Comment: (NOTE) SARS-CoV-2 target nucleic acids are NOT DETECTED.  The SARS-CoV-2 RNA is generally detectable in upper and lower respiratory specimens during the acute phase of infection. Negative results do not preclude SARS-CoV-2 infection, do not rule out co-infections with other pathogens, and should not be used as the sole basis for treatment or other patient management decisions. Negative results must be combined with clinical observations, patient history, and epidemiological information. The expected result is Negative.  Fact Sheet for Patients: SugarRoll.be  Fact Sheet for Healthcare Providers: https://www.woods-mathews.com/  This test is not yet approved or  cleared by the [place] and  has been authorized for detection and/or diagnosis of SARS-CoV-2 by FDA under an Emergency Use Authorization (EUA). This EUA will remain  in effect (meaning this test can be used) for the duration of the COVID-19 declaration under Se ction 564(b)(1) of the Act, 21 U.S.C. section 360bbb-3(b)(1), unless the authorization is terminated or revoked sooner.  Performed at [facility], [place] [address], [place], [place] [address]   MRSA PCR Screening     Status: None   Collection Time: 08/01/20  4:15 AM   Specimen: Nasal Mucosa; Nasopharyngeal  Result Value Ref Range Status   MRSA by PCR NEGATIVE NEGATIVE Final    Comment:        The GeneXpert MRSA Assay (FDA approved for NASAL specimens only), is one component of a comprehensive MRSA colonization surveillance program. It is not intended to diagnose MRSA infection nor to guide or monitor treatment for MRSA infections. Performed at [facility], [address] [place], [place] [address]     Radiologic Imaging: CT Renal Stone Study  Result Date: 07/31/2020 CLINICAL DATA:   Intermittent left lower quadrant abdominal pain for the past week. EXAM: CT ABDOMEN AND PELVIS WITHOUT CONTRAST TECHNIQUE: Multidetector CT imaging of the abdomen and pelvis was performed following the standard protocol without IV contrast. COMPARISON:  CT abdomen pelvis dated Jul 06, 2018. FINDINGS: Lower chest: No acute abnormality. Hepatobiliary: Unchanged subcentimeter low-density lesion in the right liver, remain too small to characterize. Unchanged 9 mm gallstone. No gallbladder wall thickening or biliary dilatation. Pancreas: Unremarkable. No pancreatic ductal dilatation or surrounding inflammatory changes. Spleen: Normal in size without focal abnormality. Adrenals/Urinary Tract: Adrenal glands are unremarkable. Unchanged punctate calculi in the lower poles of both kidneys. Previously seen 9 mm calculus in the upper pole of the left kidney is now in the proximal ureter with resultant mild left hydroureteronephrosis. There is also a new 3 mm calculus in the distal right ureter approximately 1 cm from the UVJ, without associated hydroureteronephrosis. The bladder is unremarkable. Stomach/Bowel: Unchanged small hiatal hernia. The stomach is otherwise within normal limits. No bowel wall thickening, distention, or surrounding inflammatory changes. Left-sided colonic diverticulosis again noted. Normal appendix. Vascular/Lymphatic: No significant vascular findings are present. No enlarged abdominal or pelvic lymph nodes. Reproductive: Status post hysterectomy. No adnexal masses. Other: No abdominal wall hernia or abnormality. No abdominopelvic ascites. No pneumoperitoneum. Musculoskeletal: No acute or significant osseous findings. IMPRESSION: 1. Previously seen 9 mm calculus in the upper pole of the left kidney is now in the proximal ureter with resultant mild left hydroureteronephrosis. 2. New 3 mm calculus in the distal right ureter approximately 1 cm from the UVJ, without associated hydroureteronephrosis. 3.  Additional bilateral nonobstructive nephrolithiasis. 4. Unchanged cholelithiasis. Electronically Signed   By: Titus Dubin M.D.   On: 07/31/2020 16:50   Assessment & Plan:  60 year old female with PMH nephrolithiasis admitted with left renal colic associated with an obstructing 9 mm proximal left ureteral stone as well as an incidental 3 mm distal right ureteral stone.  She developed hypotension overnight concerning for sepsis.  With normal labs, benign UA, no tachycardia, no fever, and a history of hypotension in response to narcotic pain medications, hypotension is more likely a medication side effect rather than a sign of sepsis.  Regardless, recommend bilateral ureteral stent placement for management of her severe left renal colic and possible source control of urinary infection.  We discussed proceeding with bilateral ureteral stent placement  with Dr. Erlene Quan today.  We discussed that she will require follow-up ureteroscopy with laser lithotripsy and stent exchange in approximately 2 weeks.  We discussed that stents may cause flank and/or bladder discomfort as well as gross hematuria and that we can manage discomfort pharmacologically.  Patient is in agreement with this plan and wishes to proceed.  Recommendations: -Cystoscopy and bilateral ureteral stent placement with Dr. Erlene Quan this morning -If pain is well controlled and no evidence of infection intraoperatively, okay for discharge this afternoon -If signs of infection intraoperatively, will need to remain inpatient for IV antibiotics -Informed consent order placed  Thank you for involving me in this patient's care, I will continue to follow along. Debroah Loop, PA-C 08/01/2020 8:50 AM

## 2020-08-01 NOTE — Anesthesia Postprocedure Evaluation (Signed)
Anesthesia Post Note  Patient: Stacy [name]  Procedure(s) Performed: CYSTOSCOPY WITH STENT PLACEMENT (Bilateral )  Patient location during evaluation: PACU Anesthesia Type: General Level of consciousness: awake and alert and oriented Pain management: pain level controlled Vital Signs Assessment: post-procedure vital signs reviewed and stable Respiratory status: spontaneous breathing, nonlabored ventilation and respiratory function stable Cardiovascular status: blood pressure returned to baseline and stable Postop Assessment: no signs of nausea or vomiting Anesthetic complications: no   No complications documented.   Last Vitals:  Vitals:   08/01/20 1030 08/01/20 1043  BP: 120/65   Pulse: 70   Resp: 14   Temp:  36.9 C  SpO2: 100%     Last Pain:  Vitals:   08/01/20 1030  TempSrc:   PainSc: 0-No pain                 Sherrina Zaugg

## 2020-08-01 NOTE — Progress Notes (Signed)
Pharmacy Antibiotic Note  Stacy [name] is a 60 y.o. female admitted on 07/31/2020 with sepsis.  Pharmacy has been consulted for Vancomycin dosing.  Plan: Vancomycin 1750 mg IV X 1 ordered for 6/7 @ 0445.  Vancomycin 1250 mg IV Q24H ordered to continue on 6/8 @ 0445.   AUC = 473.1 Vanc trough = 10.1   Height: 5\' 7"  (170.2 cm) Weight: 71.2 kg (157 lb) IBW/kg (Calculated) : 61.6  Temp (24hrs), Avg:98.4 F (36.9 C), Min:97.8 F (36.6 C), Max:98.8 F (37.1 C)  Recent Labs  Lab 07/31/20 1517  WBC 7.9  CREATININE 1.05*    Estimated Creatinine Clearance: 56.8 mL/min (A) (by C-G formula based on SCr of 1.05 mg/dL (H)).    Allergies  Allergen Reactions  . Penicillins Hives  . Amoxicillin Hives  . Depakote [Divalproex Sodium]     vomiting  . Lamictal [Lamotrigine]     Side effects of abdominal cramps, nausea , diarrhea ,nightmares    Antimicrobials this admission:   >>    >>   Dose adjustments this admission:   Microbiology results:  BCx:   UCx:    Sputum:    MRSA PCR:   Thank you for allowing pharmacy to be a part of this patient's care.  Milady Fleener D 08/01/2020 3:52 AM

## 2020-08-01 NOTE — Consult Note (Signed)
NAME:  Stacy [name], MRN:  [id number], DOB:  12-26-1960, LOS: 0 ADMISSION DATE:  07/31/2020, CONSULTATION DATE:  08/01/20 REFERRING MD:  Dr. Sidney Ace, CHIEF COMPLAINT: Abdominal pain & emesis  History of Present Illness:  60 year old female who presented to [facility] ED from home with complaints of left lower quadrant abdominal pain on 07/31/2020.  Per ED documentation patient reported having intermittent pain in the left lower quadrant area for a few days which became acutely worse.  She reports 10/10 pain is sharp and severe in nature with associated nausea and vomiting.  Patient denied fevers and reports a history of kidney stones in the past. ED course: Patient received IV Toradol & IV morphine for pain control.  IV Zofran for nausea. Initial vitals: T-97.8, RR 20, HR 73, BP 118/76 & SpO2 of 100% on RA Significant labs: UA- + hgb, CT renal study consistent with left-sided proximal ureter stone 9 mm with resultant mild left hydronephrosis and right-sided 3 mm calculus. TRH consulted to admit to medical-surgical floor with urology consulted for intervention. Hospital course: Overnight 07/31/20- 08/01/20 patient became hypotensive around 23:00 with BP 78/50 after receiving morphine 4 mg IV. Patient received 2.25 L of IVF, but BP remains soft with BP 87/45 (57). Patient received a 4 mg IVP of morphine & ativan previously around 1700-1900 without significant hypotension. Due to concerns for Septic shock/ Hypovolemic shock and vasopressor administration PCCM consulted and patient transferred to ICU.  Pertinent  Medical History  Hypothyroidism Sjogren's syndrome Raynaud disease Migraines Fibromyalgia Brain Stem lesion Bipolar disorder type II GAD Significant Hospital Events: Including procedures, antibiotic start and stop dates in addition to other pertinent events   . 07/31/20- Admit to Med-surgical floor pending urology intervention for 9 mm stone . 08/01/20- Hypotensive, transferred to ICU for  vasopressors  Interim History / Subjective:  Patient resting comfortably in bed, no complaints at this time. She reports her BP at home normally runs 110/60's Discussed plan of care including additional IVF and some empiric antibiotics with potential need for vasopressors.  Objective   Blood pressure (!) 87/45, pulse 79, temperature 98.6 F (37 C), temperature source Oral, resp. rate 18, height 5\' 7"  (1.702 m), weight 71.2 kg, SpO2 98 %.       No intake or output data in the 24 hours ending 08/01/20 0334 Filed Weights   07/31/20 1508  Weight: 71.2 kg    Examination: General: Adult female, ill-appearing, lying in bed, NAD HEENT: MM pink/moist, anicteric, atraumatic, neck supple Neuro: A&O x 4, able to follow commands, PERRL +3, MAE CV: s1s2 RRR, NSR on monitor, no r/m/g Pulm: Regular, non labored on room air, breath sounds clear-BUL & clear/diminished-BLL GI: soft, rounded, tenderness in LLQ, bs x 4 Skin: no rashes/lesions noted Extremities: warm/dry, pulses + 2 R/P, trace edema noted BLE  Labs/imaging that I have personally reviewed  (right click and "Reselect all SmartList Selections" daily)  Net: + 2.5 L Na+/ K+: 137/ 4.1 BUN/Cr.: 15/ 1.05 Serum CO2/ AG: 23/10  Hgb: 13.6 WBC/ TMAX: 7.9/ afebrile Lactic/ PCT: pending  CT renal study 07/31/20: 9 mm calculus in upper pole of L kidney > now in proximal ureter with resultant mild L hydroureteronephrosis. Additional bilateral nonobstructive nephrolithiasis & unchanged cholelithiasis Resolved Hospital Problem list     Assessment & Plan:  Hypotension secondary to suspected sepsis vs hypovolemia Patient awaiting urological intervention on 08/01/20 for 9 mm kidney stone in L ureter with L hydroureteronephrosis. Patient remains hypotensive after IVF bolus totalling 2.25 L  overnight. Concern for possible sepsis and empiric cefepime & vancomycin added by primary team. Patient transferred to ICU for possible vasopressor admin. Patient's  UOP totalled 100 mL overnight after IVF boluses, bladder scan revealed 0 mL. - STAT lactic & baseline PCT with AM labs - low suspicion for sepsis as patient has remained afebrile and does meet any other qualifications of SIRS criteria at this time - will continue empiric coverage started by primary team until lab work results: cefepime & vancomycin - Highly suspicious the patient is still hypovolemic..will complete last liter of fluid with a 750 mL LR bolus to be followed by LR @ 100 mL/h - continue midodrine started by primary team  Renal Calculi Abdominal pain secondary to renal calculus - Urology consulted, possible intervention 08/01/20 > pt NPO since midnight - continue ketorolac 30 mg IV Q 6 h PRN, pain currently controlled  Generalized anxiety disorder Depression Cognitive Impairment vs decline - continue home regimen per primary team: buspirone, hydroxyzine & clonazepam - continue home donepezil  Sjogren's syndrome - continue home hydroxychloroquine  Best practice (right click and "Reselect all SmartList Selections" daily)  Diet:  NPO Pain/Anxiety/Delirium protocol (if indicated): No VAP protocol (if indicated): Not indicated DVT prophylaxis: Subcutaneous Heparin GI prophylaxis: PPI Glucose control:  SSI No Central venous access:  N/A Arterial line:  N/A Foley:  N/A Mobility:  bed rest  PT consulted: N/A Last date of multidisciplinary goals of care discussion 07/31/20 Code Status:  full code Disposition: SDU  Labs   CBC: Recent Labs  Lab 07/31/20 1517  WBC 7.9  HGB 13.6  HCT 39.8  MCV 92.8  PLT 161    Basic Metabolic Panel: Recent Labs  Lab 07/31/20 1517  NA 139  K 4.1  CL 106  CO2 23  GLUCOSE 110*  BUN 15  CREATININE 1.05*  CALCIUM 9.3   GFR: Estimated Creatinine Clearance: 56.8 mL/min (A) (by C-G formula based on SCr of 1.05 mg/dL (H)). Recent Labs  Lab 07/31/20 1517  WBC 7.9    Liver Function Tests: Recent Labs  Lab 07/31/20 1517  AST  21  ALT 13  ALKPHOS 73  BILITOT 0.8  PROT 7.0  ALBUMIN 4.4   Recent Labs  Lab 07/31/20 1517  LIPASE 50   No results for input(s): AMMONIA in the last 168 hours.  ABG No results found for: PHART, PCO2ART, PO2ART, HCO3, TCO2, ACIDBASEDEF, O2SAT   Coagulation Profile: No results for input(s): INR, PROTIME in the last 168 hours.  Cardiac Enzymes: No results for input(s): CKTOTAL, CKMB, CKMBINDEX, TROPONINI in the last 168 hours.  HbA1C: Hgb A1c MFr Bld  Date/Time Value Ref Range Status  11/13/2015 12:33 PM 5.0 4.8 - 5.6 % Final    Comment:             Pre-diabetes: 5.7 - 6.4          Diabetes: >6.4          Glycemic control for adults with diabetes: <7.0     CBG: No results for input(s): GLUCAP in the last 168 hours.  Review of Systems: Positives in BOLD  Gen: Denies fever, chills, weight change, fatigue, night sweats HEENT: Denies blurred vision, double vision, hearing loss, tinnitus, sinus congestion, rhinorrhea, sore throat, neck stiffness, dysphagia PULM: Denies shortness of breath, cough, sputum production, hemoptysis, wheezing CV: Denies chest pain, edema, orthopnea, paroxysmal nocturnal dyspnea, palpitations GI: Denies abdominal pain, nausea, vomiting, diarrhea, hematochezia, melena, constipation, change in bowel habits GU: Denies dysuria, hematuria,  polyuria, oliguria, urethral discharge Endocrine: Denies hot or cold intolerance, polyuria, polyphagia or appetite change Derm: Denies rash, dry skin, scaling or peeling skin change Heme: Denies easy bruising, bleeding, bleeding gums Neuro: Denies headache, numbness, weakness, slurred speech, loss of memory or consciousness  Past Medical History:  She,  has a past medical history of Aneurysm ([place]), Anxiety, Arthritis, Brain stem lesion, Chronic kidney disease, Depression, Fibromyalgia, GERD (gastroesophageal reflux disease), Headache, Hypotension, Hypothyroid, Memory loss, Migraine, Raynaud disease, Sjogren's syndrome  ([place]), and Thyroid disease.   Surgical History:   Past Surgical History:  Procedure Laterality Date  . ABDOMINAL HYSTERECTOMY    . BASAL CELL CARCINOMA EXCISION    . BREAST BIOPSY    . CESAREAN SECTION    . EYE SURGERY    . KNEE SURGERY Left   . LITHOTRIPSY    . MUSCLE BIOPSY    . OOPHORECTOMY       Social History:   reports that she has never smoked. She has never used smokeless tobacco. She reports that she does not drink alcohol and does not use drugs.   Family History:  Her family history includes Alcohol abuse in her father and sister; Anxiety disorder in her maternal grandmother, mother, and sister; Cancer in her father and paternal aunt; Depression in her maternal grandmother, mother, and sister; Diabetes in her brother and maternal grandfather; Drug abuse in her brother and sister; Heart attack in her father; Hypertension in her father; Lupus in her paternal aunt; Migraines in her mother; Stroke in her father; Thyroid disease in her paternal aunt.   Allergies Allergies  Allergen Reactions  . Penicillins Hives  . Amoxicillin Hives  . Depakote [Divalproex Sodium]     vomiting  . Lamictal [Lamotrigine]     Side effects of abdominal cramps, nausea , diarrhea ,nightmares     Home Medications  Prior to Admission medications   Medication Sig Start Date End Date Taking? Authorizing Provider  Ascorbic Acid (VITAMIN C) 100 MG tablet Take 100 mg by mouth daily.   Yes [provider]  busPIRone (BUSPAR) 10 MG tablet Take 2 tablets (20 mg total) by mouth 2 (two) times daily. 07/26/20  Yes Ursula Alert, MD  Cholecalciferol (VITAMIN D-3) 1000 units CAPS Take 1,000 Units by mouth daily.   Yes [provider]  donepezil (ARICEPT) 10 MG tablet Take 10 mg by mouth every morning.   Yes [provider]  gabapentin (NEURONTIN) 300 MG capsule Take 1 capsule by mouth two times daily Patient taking differently: Take 300 mg by mouth 2 (two) times daily. 10/24/14   Yes Bobetta Lime, MD  hydroxychloroquine (PLAQUENIL) 200 MG tablet Take 200 mg by mouth 2 (two) times daily. 06/03/19  Yes [provider]  levothyroxine (SYNTHROID, LEVOTHROID) 175 MCG tablet Take 1 tablet (175 mcg total) by mouth daily before breakfast. 12/13/15  Yes Renato Shin, MD  Multiple Vitamin (MULTI-VITAMIN) tablet Take 1 tablet by mouth daily.   Yes [provider]  Multiple Vitamins-Minerals (PRESERVISION AREDS) CAPS Take 1 capsule by mouth daily at 12 noon.   Yes [provider]  omeprazole (PRILOSEC) 40 MG capsule Take 40 mg by mouth daily. 12/24/19  Yes [provider]  vitamin E 45 MG (100 UNITS) capsule Take 100 Units by mouth daily.   Yes [provider]  clonazePAM (KLONOPIN) 0.5 MG tablet Take 1 tablet (0.5 mg total) by mouth daily as needed for anxiety. AS NEEDED FOR SEVERE ANXIETY ATTACKS 03/30/20   Ursula Alert,  MD  hydrOXYzine (VISTARIL) 25 MG capsule Take 1 capsule (25 mg total) by mouth daily as needed (for severe anxiety). Anxiety/agitation- please limit use Patient not taking: No sig reported 06/10/19   Ursula Alert, MD  loperamide (IMODIUM A-D) 2 MG tablet Take 1 tablet (2 mg total) by mouth 4 (four) times daily as needed for diarrhea or loose stools. 07/06/18   Cuthriell, Charline Bills, PA-C  meloxicam (MOBIC) 15 MG tablet Take 15 mg by mouth 3 (three) times daily. Patient not taking: No sig reported 02/04/20   [provider]  Rimegepant Sulfate 75 MG TBDP Take by mouth. Patient not taking: No sig reported    [provider]  SUMAtriptan (IMITREX) 100 MG tablet Take 100 mg by mouth every 2 (two) hours as needed for migraine. 05/06/18   [provider]     Critical care time: 45 minutes       Venetia Night, AGACNP-BC Acute Care Nurse Practitioner [facility]   [phone] / [phone] Please see Amion for pager details.

## 2020-08-01 NOTE — Progress Notes (Signed)
Patient's BP remained low since 2333. I was in constant communication with covering MD's. First communication was with admitting doctor, Amy Cox, DO then Dr Sidney Ace afterwards. LR bolus and NS bolus were administered without any improvement. Patient remained A&O x4 throughout. It was decided after last NS 250 ml bolus to transfer patient to stepdown for further treatment. I called and updated husband-Thomas Dirks of patient's status and room change.

## 2020-08-02 DIAGNOSIS — N200 Calculus of kidney: Secondary | ICD-10-CM

## 2020-08-02 MED ORDER — OXYBUTYNIN CHLORIDE 5 MG PO TABS: 5.0000 mg | ORAL_TABLET | Freq: Three times a day (TID) | ORAL | 0 refills | Status: AC | PRN

## 2020-08-02 MED ORDER — OXYCODONE HCL 5 MG PO TABS: 5.0000 mg | ORAL_TABLET | ORAL | 0 refills | Status: AC | PRN

## 2020-08-02 MED ORDER — POLYETHYLENE GLYCOL 3350 17 G PO PACK
17.0000 g | PACK | Freq: Every day | ORAL | Status: DC
  Administered 2020-08-02: 17 g via ORAL
  Filled 2020-08-02: qty 1

## 2020-08-02 MED ORDER — SENNOSIDES-DOCUSATE SODIUM 8.6-50 MG PO TABS: 1.0000 | ORAL_TABLET | Freq: Two times a day (BID) | ORAL | 0 refills | Status: AC

## 2020-08-02 MED ORDER — SENNOSIDES-DOCUSATE SODIUM 8.6-50 MG PO TABS
1.0000 | ORAL_TABLET | Freq: Two times a day (BID) | ORAL | Status: DC
  Administered 2020-08-02: 1 via ORAL
  Filled 2020-08-02: qty 1

## 2020-08-02 MED ORDER — POLYETHYLENE GLYCOL 3350 17 G PO PACK: 17.0000 g | PACK | Freq: Every day | ORAL | 0 refills | Status: AC

## 2020-08-02 MED ORDER — MIDODRINE HCL 10 MG PO TABS: 10.0000 mg | ORAL_TABLET | Freq: Three times a day (TID) | ORAL | 0 refills | Status: AC

## 2020-08-02 NOTE — Discharge Summary (Addendum)
Stacy [name] XTG:[id number] DOB: 20-Sep-1960 DOA: 07/31/2020  PCP: [facility]  Admit date: 07/31/2020 Discharge date: 08/02/2020  Admitted From: home Disposition:  home  Recommendations for Outpatient Follow-up:  1. Follow up with PCP in 1 week 2. Please obtain BMP/CBC in one week 3. Please follow up urology in 2 weeks     Discharge Condition:Stable CODE STATUS:full  Diet recommendation: Heart Healthy   Brief/Interim Summary: Per HPI: Stacy [name] is a 60 y.o. female with medical history significant for cognitive impairment, bipolar type II, generalized anxiety disorder, presented to the emergency department for chief concerns of abdominal pain.she had a CT scan with results below.  She was admitted to the hospital.  Overnight she had developed hypotension after given IV morphine.  She was given IV fluids.  She was started on midodrine.  Urology was consulted.  She was taken emergently to the OR for bilateral urethral stent placement.Patient underwent cystoscopy with bilateral ureteral stent placement .  Sepsis was ruled out.  She did experience urinary retention this a.m. however after ambulation she voided with residual on bladder scan was 67 cc.  Dr. Erlene Quan via chat cleared patient to be discharged from urologic standpoint.  Acute abdominal pain secondary to obstructive renal calculus S/p cystoscopy and ureteral stent placement Urinary retention resolved after having bowel movement and ambulated She had some suprapubic pain which urology felt this was due to bladder spasm which was also causing the urinary retention Patient will need to undergo b/l ureterscopy with laser lithotripsy and b/l ureteral stent exchange next week. Urology recommended. Oxybutynin IR 5mg  tid prn for stent discomfort. No tamsulosin due to hypotension   Constipation- continue with sennokot-docusate and miralax   Hypotension Appears to have occurred after given IV pain medication Improved  with IV fluids and midodrine Sepsis was ruled out No intravenous vasopressors was indicated    History of migraines On Imitrex prn  Hypothyroidism Continue synthroid   Anxiety Continue home meds    Depression Continue home meds/BuSpar  Sjogren syndrome Continue hydroxychloroquine 200 mg p.o. twice daily  GERD Continue PPI  Neuropathy Continue gabapentin     Discharge Diagnoses:  Principal Problem:   Abdominal pain Active Problems:   Calculus of kidney   Fibromyalgia   Major depressive disorder, recurrent episode, in partial remission with anxious distress (HCC)   Sjogren's syndrome (HCC)   Acquired hypothyroidism   Bipolar 2 disorder ([place])   GAD (generalized anxiety disorder)   Shock ([place])   Ureterolithiasis    Discharge Instructions  Discharge Instructions    Call MD for:  severe uncontrolled pain   Complete by: As directed    Call MD for:  temperature >100.4   Complete by: As directed    Diet - low sodium heart healthy   Complete by: As directed    Increase activity slowly   Complete by: As directed    Drinks lots of fluid, 8 glasses a day. F/u with pcp within one week F/u with urology.   No wound care   Complete by: As directed      Allergies as of 08/02/2020      Reactions   Penicillins Hives   Amoxicillin Hives   Depakote [divalproex Sodium]    vomiting   Lamictal [lamotrigine]    Side effects of abdominal cramps, nausea , diarrhea ,nightmares      Medication List    STOP taking these medications   hydrOXYzine 25 MG capsule Commonly known as: Vistaril   loperamide 2  MG tablet Commonly known as: IMODIUM A-D   meloxicam 15 MG tablet Commonly known as: MOBIC   Multi-Vitamin tablet   Rimegepant Sulfate 75 MG Tbdp     TAKE these medications   busPIRone 10 MG tablet Commonly known as: BUSPAR Take 2 tablets (20 mg total) by mouth 2 (two) times daily.   clonazePAM 0.5 MG tablet Commonly known as: KlonoPIN Take  1 tablet (0.5 mg total) by mouth daily as needed for anxiety. AS NEEDED FOR SEVERE ANXIETY ATTACKS   donepezil 10 MG tablet Commonly known as: ARICEPT Take 10 mg by mouth every morning.   gabapentin 300 MG capsule Commonly known as: NEURONTIN Take 1 capsule by mouth two times daily   hydroxychloroquine 200 MG tablet Commonly known as: PLAQUENIL Take 200 mg by mouth 2 (two) times daily.   levothyroxine 175 MCG tablet Commonly known as: SYNTHROID Take 1 tablet (175 mcg total) by mouth daily before breakfast.   midodrine 10 MG tablet Commonly known as: PROAMATINE Take 1 tablet (10 mg total) by mouth 3 (three) times daily with meals for 7 days.   omeprazole 40 MG capsule Commonly known as: PRILOSEC Take 40 mg by mouth daily.   oxybutynin 5 MG tablet Commonly known as: DITROPAN Take 1 tablet (5 mg total) by mouth 3 (three) times daily as needed (bladder spasms/ bladder pain).   oxyCODONE 5 MG immediate release tablet Commonly known as: Oxy IR/ROXICODONE Take 1 tablet (5 mg total) by mouth every 4 (four) hours as needed for up to 3 days for moderate pain.   polyethylene glycol 17 g packet Commonly known as: MIRALAX / GLYCOLAX Take 17 g by mouth daily. Start taking on: August 03, 2020   PreserVision AREDS Caps Take 1 capsule by mouth daily at 12 noon.   senna-docusate 8.6-50 MG tablet Commonly known as: Senokot-S Take 1 tablet by mouth 2 (two) times daily for 7 days.   SUMAtriptan 100 MG tablet Commonly known as: IMITREX Take 100 mg by mouth every 2 (two) hours as needed for migraine.   vitamin C 100 MG tablet Take 100 mg by mouth daily.   Vitamin D-3 25 MCG (1000 UT) Caps Take 1,000 Units by mouth daily.   vitamin E 45 MG (100 UNITS) capsule Take 100 Units by mouth daily.       Follow-up Information    Hollice Espy, MD Follow up in 2 week(s).   Specialty: Urology Contact information: [place] [address] [place], [place] Follow up in 1 week(s).   Contact information: [place] [address] [phone]              Allergies  Allergen Reactions  . Penicillins Hives  . Amoxicillin Hives  . Depakote [Divalproex Sodium]     vomiting  . Lamictal [Lamotrigine]     Side effects of abdominal cramps, nausea , diarrhea ,nightmares    Consultations:  Urology   Procedures/Studies: DG C-Arm 1-60 Min-No Report  Result Date: 08/01/2020 Fluoroscopy was utilized by the requesting physician.  No radiographic interpretation.   CT Renal Stone Study  Result Date: 07/31/2020 CLINICAL DATA:  Intermittent left lower quadrant abdominal pain for the past week. EXAM: CT ABDOMEN AND PELVIS WITHOUT CONTRAST TECHNIQUE: Multidetector CT imaging of the abdomen and pelvis was performed following the standard protocol without IV contrast. COMPARISON:  CT abdomen pelvis dated Jul 06, 2018. FINDINGS: Lower chest: No  acute abnormality. Hepatobiliary: Unchanged subcentimeter low-density lesion in the right liver, remain too small to characterize. Unchanged 9 mm gallstone. No gallbladder wall thickening or biliary dilatation. Pancreas: Unremarkable. No pancreatic ductal dilatation or surrounding inflammatory changes. Spleen: Normal in size without focal abnormality. Adrenals/Urinary Tract: Adrenal glands are unremarkable. Unchanged punctate calculi in the lower poles of both kidneys. Previously seen 9 mm calculus in the upper pole of the left kidney is now in the proximal ureter with resultant mild left hydroureteronephrosis. There is also a new 3 mm calculus in the distal right ureter approximately 1 cm from the UVJ, without associated hydroureteronephrosis. The bladder is unremarkable. Stomach/Bowel: Unchanged small hiatal hernia. The stomach is otherwise within normal limits. No bowel wall thickening, distention, or surrounding inflammatory changes. Left-sided colonic diverticulosis again  noted. Normal appendix. Vascular/Lymphatic: No significant vascular findings are present. No enlarged abdominal or pelvic lymph nodes. Reproductive: Status post hysterectomy. No adnexal masses. Other: No abdominal wall hernia or abnormality. No abdominopelvic ascites. No pneumoperitoneum. Musculoskeletal: No acute or significant osseous findings. IMPRESSION: 1. Previously seen 9 mm calculus in the upper pole of the left kidney is now in the proximal ureter with resultant mild left hydroureteronephrosis. 2. New 3 mm calculus in the distal right ureter approximately 1 cm from the UVJ, without associated hydroureteronephrosis. 3. Additional bilateral nonobstructive nephrolithiasis. 4. Unchanged cholelithiasis. Electronically Signed   By: Titus Dubin M.D.   On: 07/31/2020 16:50      Subjective: Had BM. Urinary retention resolved  Discharge Exam: Vitals:   08/02/20 0736 08/02/20 1132  BP: 112/70 108/67  Pulse: 64 72  Resp: 17 18  Temp: 98.4 F (36.9 C) 98.9 F (37.2 C)  SpO2: 100% 99%   Vitals:   08/02/20 0132 08/02/20 0422 08/02/20 0736 08/02/20 1132  BP: 105/61 105/62 112/70 108/67  Pulse: 62 65 64 72  Resp: 16 16 17 18   Temp: 97.6 F (36.4 C) 97.8 F (36.6 C) 98.4 F (36.9 C) 98.9 F (37.2 C)  TempSrc: Oral Oral Oral Oral  SpO2: 100% 99% 100% 99%  Weight:      Height:        General: Pt is alert, awake, not in acute distress Cardiovascular: RRR, S1/S2 +, no rubs, no gallops Respiratory: CTA bilaterally, no wheezing, no rhonchi Abdominal: Soft, NT, ND, bowel sounds + Extremities: no edema, no cyanosis    The results of significant diagnostics from this hospitalization (including imaging, microbiology, ancillary and laboratory) are listed below for reference.     Microbiology: Recent Results (from the past 240 hour(s))  SARS CORONAVIRUS 2 (TAT 6-24 HRS) Nasopharyngeal Nasopharyngeal Swab     Status: None   Collection Time: 07/31/20  5:55 PM   Specimen: Nasopharyngeal  Swab  Result Value Ref Range Status   SARS Coronavirus 2 NEGATIVE NEGATIVE Final    Comment: (NOTE) SARS-CoV-2 target nucleic acids are NOT DETECTED.  The SARS-CoV-2 RNA is generally detectable in upper and lower respiratory specimens during the acute phase of infection. Negative results do not preclude SARS-CoV-2 infection, do not rule out co-infections with other pathogens, and should not be used as the sole basis for treatment or other patient management decisions. Negative results must be combined with clinical observations, patient history, and epidemiological information. The expected result is Negative.  Fact Sheet for Patients: SugarRoll.be  Fact Sheet for Healthcare Providers: https://www.woods-mathews.com/  This test is not yet approved or cleared by the [place] FDA and  has been authorized for detection and/or diagnosis of  SARS-CoV-2 by FDA under an Emergency Use Authorization (EUA). This EUA will remain  in effect (meaning this test can be used) for the duration of the COVID-19 declaration under Se ction 564(b)(1) of the Act, 21 U.S.C. section 360bbb-3(b)(1), unless the authorization is terminated or revoked sooner.  Performed at [facility], [place] [address], [place], [place] [address]   MRSA PCR Screening     Status: None   Collection Time: 08/01/20  4:15 AM   Specimen: Nasal Mucosa; Nasopharyngeal  Result Value Ref Range Status   MRSA by PCR NEGATIVE NEGATIVE Final    Comment:        The GeneXpert MRSA Assay (FDA approved for NASAL specimens only), is one component of a comprehensive MRSA colonization surveillance program. It is not intended to diagnose MRSA infection nor to guide or monitor treatment for MRSA infections. Performed at [facility], [address] [place], [place] [address]      Labs: BNP (last 3 results) No results for input(s): BNP in the last 8760 hours. Basic  Metabolic Panel: Recent Labs  Lab 07/31/20 1517 08/01/20 0416  NA 139 139  K 4.1 3.4*  CL 106 111  CO2 23 23  GLUCOSE 110* 99  BUN 15 15  CREATININE 1.05* 0.97  CALCIUM 9.3 8.2*  MG  --  2.0  PHOS  --  4.0   Liver Function Tests: Recent Labs  Lab 07/31/20 1517  AST 21  ALT 13  ALKPHOS 73  BILITOT 0.8  PROT 7.0  ALBUMIN 4.4   Recent Labs  Lab 07/31/20 1517  LIPASE 50   No results for input(s): AMMONIA in the last 168 hours. CBC: Recent Labs  Lab 07/31/20 1517 08/01/20 0416  WBC 7.9 7.4  HGB 13.6 11.3*  HCT 39.8 34.2*  MCV 92.8 94.0  PLT 211 174   Cardiac Enzymes: No results for input(s): CKTOTAL, CKMB, CKMBINDEX, TROPONINI in the last 168 hours. BNP: Invalid input(s): POCBNP CBG: Recent Labs  Lab 08/01/20 0357  GLUCAP 91   D-Dimer No results for input(s): DDIMER in the last 72 hours. Hgb A1c No results for input(s): HGBA1C in the last 72 hours. Lipid Profile No results for input(s): CHOL, HDL, LDLCALC, TRIG, CHOLHDL, LDLDIRECT in the last 72 hours. Thyroid function studies No results for input(s): TSH, T4TOTAL, T3FREE, THYROIDAB in the last 72 hours.  Invalid input(s): FREET3 Anemia work up No results for input(s): VITAMINB12, FOLATE, FERRITIN, TIBC, IRON, RETICCTPCT in the last 72 hours. Urinalysis    Component Value Date/Time   COLORURINE STRAW (A) 07/31/2020 1718   APPEARANCEUR CLEAR (A) 07/31/2020 1718   LABSPEC 1.006 07/31/2020 1718   PHURINE 7.0 07/31/2020 1718   GLUCOSEU NEGATIVE 07/31/2020 1718   HGBUR MODERATE (A) 07/31/2020 1718   BILIRUBINUR NEGATIVE 07/31/2020 1718   KETONESUR 20 (A) 07/31/2020 1718   PROTEINUR NEGATIVE 07/31/2020 1718   NITRITE NEGATIVE 07/31/2020 1718   LEUKOCYTESUR NEGATIVE 07/31/2020 1718   Sepsis Labs Invalid input(s): PROCALCITONIN,  WBC,  LACTICIDVEN Microbiology Recent Results (from the past 240 hour(s))  SARS CORONAVIRUS 2 (TAT 6-24 HRS) Nasopharyngeal Nasopharyngeal Swab     Status: None    Collection Time: 07/31/20  5:55 PM   Specimen: Nasopharyngeal Swab  Result Value Ref Range Status   SARS Coronavirus 2 NEGATIVE NEGATIVE Final    Comment: (NOTE) SARS-CoV-2 target nucleic acids are NOT DETECTED.  The SARS-CoV-2 RNA is generally detectable in upper and lower respiratory specimens during the acute phase of infection.  Negative results do not preclude SARS-CoV-2 infection, do not rule out co-infections with other pathogens, and should not be used as the sole basis for treatment or other patient management decisions. Negative results must be combined with clinical observations, patient history, and epidemiological information. The expected result is Negative.  Fact Sheet for Patients: SugarRoll.be  Fact Sheet for Healthcare Providers: https://www.woods-mathews.com/  This test is not yet approved or cleared by the [place] FDA and  has been authorized for detection and/or diagnosis of SARS-CoV-2 by FDA under an Emergency Use Authorization (EUA). This EUA will remain  in effect (meaning this test can be used) for the duration of the COVID-19 declaration under Se ction 564(b)(1) of the Act, 21 U.S.C. section 360bbb-3(b)(1), unless the authorization is terminated or revoked sooner.  Performed at [facility], [place] [address] [place], [place] [address]   MRSA PCR Screening     Status: None   Collection Time: 08/01/20  4:15 AM   Specimen: Nasal Mucosa; Nasopharyngeal  Result Value Ref Range Status   MRSA by PCR NEGATIVE NEGATIVE Final    Comment:        The GeneXpert MRSA Assay (FDA approved for NASAL specimens only), is one component of a comprehensive MRSA colonization surveillance program. It is not intended to diagnose MRSA infection nor to guide or monitor treatment for MRSA infections. Performed at [facility], [address] [place], [place] [address]      Time coordinating discharge: Over  30 minutes  SIGNED:   Nolberto Hanlon, MD  [facility] 08/02/2020, 12:42 PM Pager   If 7PM-7AM, please contact night-coverage [email or web address] Password [email or web address]

## 2020-08-02 NOTE — Progress Notes (Signed)
PROGRESS NOTE    Stacy [name]  QIH:[id number] DOB: 24-Jul-1960 DOA: 07/31/2020 PCP: [facility]   Brief Narrative:  60 y.o. female with medical history significant for cognitive impairment, bipolar type II, generalized anxiety disorder, presents to the emergency department for chief concerns of abdominal pain.  She reports that the pain started at approximately 1 pm today, sharp, persistent, at 10/10. She reports that currently the pain is 8/10 after pain medication. She denies fever, and states the pain is similar to previous episodes of kidney stones.   6/7: Overnight patient was transferred to stepdown unit and critical care consultation requested for persistent hypotension.  No clinical evidence of shock physiology.  Patient maintained alertness and was fever free without leukocytosis.  Later in the day patient underwent cystoscopy with bilateral ureteral stent placement with urology.  Found large 9 mm proximal mid ureteral calculus with stent placed without difficulty.  Postobstructive diuresis appreciated on placement without purulence.  6/8- inability to urinate. Urology made aware. Bladder scan >300 cc.also c/o suprapubic pain  Assessment & Plan:   Principal Problem:   Abdominal pain Active Problems:   Calculus of kidney   Fibromyalgia   Major depressive disorder, recurrent episode, in partial remission with anxious distress (HCC)   Sjogren's syndrome (HCC)   Acquired hypothyroidism   Bipolar 2 disorder (HCC)   GAD (generalized anxiety disorder)   Shock ([place])   Ureterolithiasis  Acute abdominal pain secondary to obstructive renal calculus S/p cystoscopy and ureteral stent placement Now with urinary retention, bladder scan >300.  Urology recommends ambulation and I/O cath. Likely pain/retention from bladder spasm. Will need to undergo b/l ureterscopy with laser lithotripsy and b/l ureteral stent exchange next week. Urology Rec. Oxybutynin IR 5mg  tid prn for  stent discomfort. No tamsulosin due to hypotension Will do I/o cath to see if bladder function returns.   Constipation: Start bowel regimen   Hypotension Transferred to stepdown unit and critical care consultation called Midodrine 10mg  3 times daily started No intravenous vasopressors indicated 6/8- bp stable.  Continue with hydration po and midodrine   History of migraines Continue Imitrex prn  Hypothyroidism Continue synthroid   Anxiety Hydroxyzine 25 mg as needed Clonazepam 0.5 mg twice daily as needed   Cognitive impairment Resume home donepezil  Depression BuSpar  Sjogren syndrome Hydroxychloroquine 200 mg p.o. twice daily  GERD PPI  Neuropathy PTA gabapentin   DVT prophylaxis: SCDs Code Status: Full Family Communication: pt declined calling family Disposition Plan: Status is: Inpatient  Remains inpatient appropriate because:Inpatient level of care appropriate due to severity of illness   Dispo: The patient is from: Home              Anticipated d/c is to: Home              Patient currently is not medically stable to d/c.   Difficult to place patient No   Pt with pain/urinary retention, needing I/O cath, constipated. Need to resolve prior to dc. Possible dc in am    Level of care: Med-Surg  Consultants:   Urology  Procedures:   Cystoscopy with ureteral stent placement  Antimicrobials:   None   Subjective: C/o suprapubic pain, unable to urinate. No dyuria, just feels has to push out   Objective: Vitals:   08/01/20 2222 08/02/20 0132 08/02/20 0422 08/02/20 0736  BP: 114/69 105/61 105/62 112/70  Pulse: 64 62 65 64  Resp: 16 16 16 17   Temp: 98.2 F (36.8 C) 97.6 F (36.4  C) 97.8 F (36.6 C) 98.4 F (36.9 C)  TempSrc: Oral Oral Oral Oral  SpO2: 100% 100% 99% 100%  Weight: 77.2 kg     Height: 5\' 7"  (1.702 m)       Intake/Output Summary (Last 24 hours) at 08/02/2020 1051 Last data filed at 08/02/2020 1028 Gross per 24 hour   Intake 1098.31 ml  Output 3900 ml  Net -2801.69 ml   Filed Weights   07/31/20 1508 08/01/20 0400 08/01/20 2222  Weight: 71.2 kg 74.5 kg 77.2 kg    Examination: Nad cta no w/r/r Regular s1/s2 no gallops Soft benign +bs No edema Aaxox3, grossly intact     Data Reviewed: I have personally reviewed following labs and imaging studies  CBC: Recent Labs  Lab 07/31/20 1517 08/01/20 0416  WBC 7.9 7.4  HGB 13.6 11.3*  HCT 39.8 34.2*  MCV 92.8 94.0  PLT 211 132   Basic Metabolic Panel: Recent Labs  Lab 07/31/20 1517 08/01/20 0416  NA 139 139  K 4.1 3.4*  CL 106 111  CO2 23 23  GLUCOSE 110* 99  BUN 15 15  CREATININE 1.05* 0.97  CALCIUM 9.3 8.2*  MG  --  2.0  PHOS  --  4.0   GFR: Estimated Creatinine Clearance: 67.7 mL/min (by C-G formula based on SCr of 0.97 mg/dL). Liver Function Tests: Recent Labs  Lab 07/31/20 1517  AST 21  ALT 13  ALKPHOS 73  BILITOT 0.8  PROT 7.0  ALBUMIN 4.4   Recent Labs  Lab 07/31/20 1517  LIPASE 50   No results for input(s): AMMONIA in the last 168 hours. Coagulation Profile: No results for input(s): INR, PROTIME in the last 168 hours. Cardiac Enzymes: No results for input(s): CKTOTAL, CKMB, CKMBINDEX, TROPONINI in the last 168 hours. BNP (last 3 results) No results for input(s): PROBNP in the last 8760 hours. HbA1C: No results for input(s): HGBA1C in the last 72 hours. CBG: Recent Labs  Lab 08/01/20 0357  GLUCAP 91   Lipid Profile: No results for input(s): CHOL, HDL, LDLCALC, TRIG, CHOLHDL, LDLDIRECT in the last 72 hours. Thyroid Function Tests: No results for input(s): TSH, T4TOTAL, FREET4, T3FREE, THYROIDAB in the last 72 hours. Anemia Panel: No results for input(s): VITAMINB12, FOLATE, FERRITIN, TIBC, IRON, RETICCTPCT in the last 72 hours. Sepsis Labs: Recent Labs  Lab 08/01/20 0416  PROCALCITON <0.10  LATICACIDVEN 1.7    Recent Results (from the past 240 hour(s))  SARS CORONAVIRUS 2 (TAT 6-24 HRS)  Nasopharyngeal Nasopharyngeal Swab     Status: None   Collection Time: 07/31/20  5:55 PM   Specimen: Nasopharyngeal Swab  Result Value Ref Range Status   SARS Coronavirus 2 NEGATIVE NEGATIVE Final    Comment: (NOTE) SARS-CoV-2 target nucleic acids are NOT DETECTED.  The SARS-CoV-2 RNA is generally detectable in upper and lower respiratory specimens during the acute phase of infection. Negative results do not preclude SARS-CoV-2 infection, do not rule out co-infections with other pathogens, and should not be used as the sole basis for treatment or other patient management decisions. Negative results must be combined with clinical observations, patient history, and epidemiological information. The expected result is Negative.  Fact Sheet for Patients: SugarRoll.be  Fact Sheet for Healthcare Providers: https://www.woods-mathews.com/  This test is not yet approved or cleared by the [place] FDA and  has been authorized for detection and/or diagnosis of SARS-CoV-2 by FDA under an Emergency Use Authorization (EUA). This EUA will remain  in effect (meaning this test  can be used) for the duration of the COVID-19 declaration under Se ction 564(b)(1) of the Act, 21 U.S.C. section 360bbb-3(b)(1), unless the authorization is terminated or revoked sooner.  Performed at [facility], [place] [address], [place], [place] [address]   MRSA PCR Screening     Status: None   Collection Time: 08/01/20  4:15 AM   Specimen: Nasal Mucosa; Nasopharyngeal  Result Value Ref Range Status   MRSA by PCR NEGATIVE NEGATIVE Final    Comment:        The GeneXpert MRSA Assay (FDA approved for NASAL specimens only), is one component of a comprehensive MRSA colonization surveillance program. It is not intended to diagnose MRSA infection nor to guide or monitor treatment for MRSA infections. Performed at [facility], [address]  [place], [place] [address]          Radiology Studies: DG C-Arm 1-60 Min-No Report  Result Date: 08/01/2020 Fluoroscopy was utilized by the requesting physician.  No radiographic interpretation.   CT Renal Stone Study  Result Date: 07/31/2020 CLINICAL DATA:  Intermittent left lower quadrant abdominal pain for the past week. EXAM: CT ABDOMEN AND PELVIS WITHOUT CONTRAST TECHNIQUE: Multidetector CT imaging of the abdomen and pelvis was performed following the standard protocol without IV contrast. COMPARISON:  CT abdomen pelvis dated Jul 06, 2018. FINDINGS: Lower chest: No acute abnormality. Hepatobiliary: Unchanged subcentimeter low-density lesion in the right liver, remain too small to characterize. Unchanged 9 mm gallstone. No gallbladder wall thickening or biliary dilatation. Pancreas: Unremarkable. No pancreatic ductal dilatation or surrounding inflammatory changes. Spleen: Normal in size without focal abnormality. Adrenals/Urinary Tract: Adrenal glands are unremarkable. Unchanged punctate calculi in the lower poles of both kidneys. Previously seen 9 mm calculus in the upper pole of the left kidney is now in the proximal ureter with resultant mild left hydroureteronephrosis. There is also a new 3 mm calculus in the distal right ureter approximately 1 cm from the UVJ, without associated hydroureteronephrosis. The bladder is unremarkable. Stomach/Bowel: Unchanged small hiatal hernia. The stomach is otherwise within normal limits. No bowel wall thickening, distention, or surrounding inflammatory changes. Left-sided colonic diverticulosis again noted. Normal appendix. Vascular/Lymphatic: No significant vascular findings are present. No enlarged abdominal or pelvic lymph nodes. Reproductive: Status post hysterectomy. No adnexal masses. Other: No abdominal wall hernia or abnormality. No abdominopelvic ascites. No pneumoperitoneum. Musculoskeletal: No acute or significant osseous findings. IMPRESSION: 1.  Previously seen 9 mm calculus in the upper pole of the left kidney is now in the proximal ureter with resultant mild left hydroureteronephrosis. 2. New 3 mm calculus in the distal right ureter approximately 1 cm from the UVJ, without associated hydroureteronephrosis. 3. Additional bilateral nonobstructive nephrolithiasis. 4. Unchanged cholelithiasis. Electronically Signed   By: Titus Dubin M.D.   On: 07/31/2020 16:50        Scheduled Meds: . ascorbic acid  250 mg Oral Daily  . busPIRone  20 mg Oral BID  . Chlorhexidine Gluconate Cloth  6 each Topical Daily  . cholecalciferol  1,000 Units Oral Daily  . donepezil  10 mg Oral q morning  . gabapentin  300 mg Oral BID  . hydroxychloroquine  200 mg Oral BID  . levothyroxine  175 mcg Oral Q0600  . midodrine  10 mg Oral TID WC  . pantoprazole  80 mg Oral Daily   Continuous Infusions: . sodium chloride 1,000 mL (08/01/20 1045)  . lactated ringers 100 mL/hr at 08/02/20 0800     LOS: 1  day    Time spent: 35 min with >50% on coc   Nolberto Hanlon, MD [facility] Pager [phone]  If 7PM-7AM, please contact night-coverage 08/02/2020, 10:51 AM

## 2020-08-02 NOTE — Progress Notes (Signed)
Urology Consult Follow Up  Subjective: Patient with lower abdominal discomfort this morning that she states is controlled with pain medication.  She states that she has not had a bowel movement since she has been admitted, but she said that she had a good void earlier around 1 am.    VSS afebrile  Her serum creatinine is down from 1.05-0.97 this morning.  Her WBC count is normal at 7.4.  Anti-infectives: Anti-infectives (From admission, onward)   Start     Dose/Rate Route Frequency Ordered Stop   08/02/20 0500  vancomycin (VANCOREADY) IVPB 1250 mg/250 mL  Status:  Discontinued        1,250 mg 166.7 mL/hr over 90 Minutes Intravenous Every 24 hours 08/01/20 0352 08/01/20 0615   08/01/20 0430  vancomycin (VANCOREADY) IVPB 1750 mg/350 mL  Status:  Discontinued        1,750 mg 175 mL/hr over 120 Minutes Intravenous  Once 08/01/20 0343 08/01/20 0615   08/01/20 0400  ceFEPIme (MAXIPIME) 2 g in sodium chloride 0.9 % 100 mL IVPB  Status:  Discontinued        2 g 200 mL/hr over 30 Minutes Intravenous Every 8 hours 08/01/20 0328 08/01/20 0615   07/31/20 2200  hydroxychloroquine (PLAQUENIL) tablet 200 mg        200 mg Oral 2 times daily 07/31/20 1835        Current Facility-Administered Medications  Medication Dose Route Frequency Provider Last Rate Last Admin  . 0.9 %  sodium chloride infusion  250 mL Intravenous Continuous Rust-Chester, Britton L, NP 20 mL/hr at 08/01/20 1045 1,000 mL at 08/01/20 1045  . acetaminophen (TYLENOL) tablet 650 mg  650 mg Oral Q6H PRN Cox, Amy N, DO       Or  . acetaminophen (TYLENOL) suppository 650 mg  650 mg Rectal Q6H PRN Cox, Amy N, DO      . ascorbic acid (VITAMIN C) tablet 250 mg  250 mg Oral Daily Cox, Amy N, DO   250 mg at 08/01/20 1139  . belladonna-opium (B&O) suppository 16.2-60mg   1 suppository Rectal Q8H PRN Hollice Espy, MD      . busPIRone (BUSPAR) tablet 20 mg  20 mg Oral BID Cox, Amy N, DO   20 mg at 08/01/20 2123  . Chlorhexidine Gluconate  Cloth 2 % PADS 6 each  6 each Topical Daily Sreenath, Sudheer B, MD      . cholecalciferol (VITAMIN D3) tablet 1,000 Units  1,000 Units Oral Daily Cox, Amy N, DO   1,000 Units at 08/01/20 1140  . donepezil (ARICEPT) tablet 10 mg  10 mg Oral q morning Cox, Amy N, DO   10 mg at 08/01/20 1145  . gabapentin (NEURONTIN) capsule 300 mg  300 mg Oral BID Cox, Amy N, DO   300 mg at 08/01/20 2123  . HYDROmorphone (DILAUDID) injection 0.5 mg  0.5 mg Intravenous Q3H PRN Ralene Muskrat B, MD   0.5 mg at 08/01/20 2128  . hydroxychloroquine (PLAQUENIL) tablet 200 mg  200 mg Oral BID Cox, Amy N, DO   200 mg at 08/01/20 2123  . hydrOXYzine (ATARAX/VISTARIL) tablet 25 mg  25 mg Oral Daily PRN Cox, Amy N, DO      . lactated ringers infusion   Intravenous Continuous Rust-Chester, Britton L, NP 100 mL/hr at 08/01/20 2224 New Bag at 08/01/20 2224  . levothyroxine (SYNTHROID) tablet 175 mcg  175 mcg Oral Q0600 Cox, Amy N, DO   175 mcg at 08/02/20 0516  .  midodrine (PROAMATINE) tablet 10 mg  10 mg Oral TID WC Mansy, Jan A, MD   10 mg at 08/01/20 1800  . ondansetron (ZOFRAN) tablet 4 mg  4 mg Oral Q6H PRN Cox, Amy N, DO       Or  . ondansetron (ZOFRAN) injection 4 mg  4 mg Intravenous Q6H PRN Cox, Amy N, DO      . oxybutynin (DITROPAN) tablet 5 mg  5 mg Oral TID PRN Hollice Espy, MD      . oxyCODONE (Oxy IR/ROXICODONE) immediate release tablet 5 mg  5 mg Oral Q4H PRN Ralene Muskrat B, MD   5 mg at 08/02/20 0342  . pantoprazole (PROTONIX) EC tablet 80 mg  80 mg Oral Daily Cox, Amy N, DO   80 mg at 08/01/20 1139  . SUMAtriptan (IMITREX) tablet 100 mg  100 mg Oral Q2H PRN Cox, Amy N, DO         Objective: Vital signs in last 24 hours: Temp:  [97.6 F (36.4 C)-98.9 F (37.2 C)] 97.8 F (36.6 C) (06/08 0422) Pulse Rate:  [61-84] 65 (06/08 0422) Resp:  [12-27] 16 (06/08 0422) BP: (97-131)/(53-81) 105/62 (06/08 0422) SpO2:  [96 %-100 %] 99 % (06/08 0422) Weight:  [77.2 kg] 77.2 kg (06/07  2222)  Intake/Output from previous day: 06/07 0701 - 06/08 0700 In: 1798.3 [P.O.:480; I.V.:1318.3] Out: 3950 [Urine:3950] Intake/Output this shift: No intake/output data recorded.   Physical Exam Constitutional:  Well nourished. Alert and oriented, No acute distress. HEENT: [place] AT, moist mucus membranes.  Trachea midline, no masses. Cardiovascular: No clubbing, cyanosis, or edema. Respiratory: Normal respiratory effort, no increased work of breathing. GI: Abdomen is soft, tender, non distended, no abdominal masses. Neurologic: Grossly intact, no focal deficits, moving all 4 extremities. Psychiatric: Normal mood and affect.   Lab Results:  Recent Labs    07/31/20 1517 08/01/20 0416  WBC 7.9 7.4  HGB 13.6 11.3*  HCT 39.8 34.2*  PLT 211 174   BMET Recent Labs    07/31/20 1517 08/01/20 0416  NA 139 139  K 4.1 3.4*  CL 106 111  CO2 23 23  GLUCOSE 110* 99  BUN 15 15  CREATININE 1.05* 0.97  CALCIUM 9.3 8.2*   PT/INR No results for input(s): LABPROT, INR in the last 72 hours. ABG No results for input(s): PHART, HCO3 in the last 72 hours.  Invalid input(s): PCO2, PO2  Studies/Results: DG C-Arm 1-60 Min-No Report  Result Date: 08/01/2020 Fluoroscopy was utilized by the requesting physician.  No radiographic interpretation.   CT Renal Stone Study  Result Date: 07/31/2020 CLINICAL DATA:  Intermittent left lower quadrant abdominal pain for the past week. EXAM: CT ABDOMEN AND PELVIS WITHOUT CONTRAST TECHNIQUE: Multidetector CT imaging of the abdomen and pelvis was performed following the standard protocol without IV contrast. COMPARISON:  CT abdomen pelvis dated Jul 06, 2018. FINDINGS: Lower chest: No acute abnormality. Hepatobiliary: Unchanged subcentimeter low-density lesion in the right liver, remain too small to characterize. Unchanged 9 mm gallstone. No gallbladder wall thickening or biliary dilatation. Pancreas: Unremarkable. No pancreatic ductal dilatation or  surrounding inflammatory changes. Spleen: Normal in size without focal abnormality. Adrenals/Urinary Tract: Adrenal glands are unremarkable. Unchanged punctate calculi in the lower poles of both kidneys. Previously seen 9 mm calculus in the upper pole of the left kidney is now in the proximal ureter with resultant mild left hydroureteronephrosis. There is also a new 3 mm calculus in the distal right ureter approximately 1 cm from  the UVJ, without associated hydroureteronephrosis. The bladder is unremarkable. Stomach/Bowel: Unchanged small hiatal hernia. The stomach is otherwise within normal limits. No bowel wall thickening, distention, or surrounding inflammatory changes. Left-sided colonic diverticulosis again noted. Normal appendix. Vascular/Lymphatic: No significant vascular findings are present. No enlarged abdominal or pelvic lymph nodes. Reproductive: Status post hysterectomy. No adnexal masses. Other: No abdominal wall hernia or abnormality. No abdominopelvic ascites. No pneumoperitoneum. Musculoskeletal: No acute or significant osseous findings. IMPRESSION: 1. Previously seen 9 mm calculus in the upper pole of the left kidney is now in the proximal ureter with resultant mild left hydroureteronephrosis. 2. New 3 mm calculus in the distal right ureter approximately 1 cm from the UVJ, without associated hydroureteronephrosis. 3. Additional bilateral nonobstructive nephrolithiasis. 4. Unchanged cholelithiasis. Electronically Signed   By: Titus Dubin M.D.   On: 07/31/2020 16:50     Assessment: 60 year old female who was found to have an obstructing 9 mm proximal left ureteral stone as well as a nonobstructing 3 mm distal right ureteral stone in the setting of hypotension.  She was admitted to the ICU and underwent bilateral ureteral stent placement on August 01, 2020 for poorly controlled pain and hypotension.  Plan: -bladder scan patient for PVR, if > 300 perform in and out cath -recommend a stool  softener for constipation  -Discussed with patient that our recommendations for definitive stone management would be to undergo bilateral ureteroscopy with laser lithotripsy and bilateral ureteral stent exchange next week.   - I explained to the her how the procedure is performed and the risks involved - I informed her that she will have her stents exchanged during the procedure and will remain in place after the procedure for a short time.  - I explained the stents would be removed in the office with a cystoscope  - As she currently has stents in place, she is familiar with "stent pain"  - I advised her that residual stones within the kidney or ureter may be present after the procedure and may need to have these addressed at a different encounter - She voiced her understanding and is agreeable to having ureteroscopy next week -Recommend oxybutynin IR 5 mg 3 times daily as needed for stent discomfort -As she has issues with hypotension, I would not recommend tamsulosin at this time   Addendum: Patient's bladder scan noted greater than 353 cc.  I recommend In-N-Out cath, start ambulation and bowel regimen to relieve constipation to see if bladder function returns.    LOS: 1 day    [facility] 08/02/2020

## 2020-08-03 ENCOUNTER — Other Ambulatory Visit: Payer: Self-pay | Admitting: Urology

## 2020-08-03 DIAGNOSIS — N201 Calculus of ureter: Secondary | ICD-10-CM

## 2020-08-06 NOTE — Progress Notes (Signed)
   07/31/20 2333  Assess: MEWS Score  Temp 98.6 F (37 C)  BP (!) 78/50  Pulse Rate 76  Resp 18  SpO2 97 %  O2 Device Room Air  Assess: MEWS Score  MEWS Temp 0  MEWS Systolic 2  MEWS Pulse 0  MEWS RR 0  MEWS LOC 0  MEWS Score 2  MEWS Score Color Yellow  Assess: if the MEWS score is Yellow or Red  Were vital signs taken at a resting state? Yes  Focused Assessment Change from prior assessment (see assessment flowsheet)  Early Detection of Sepsis Score *See Row Information* Low  MEWS guidelines implemented *See Row Information* Yes  Treat  MEWS Interventions Escalated (See documentation below)  Pain Scale 0-10  Pain Score 3  Pain Type Acute pain  Pain Location Abdomen  Pain Orientation Left  Take Vital Signs  Increase Vital Sign Frequency  Yellow: Q 2hr X 2 then Q 4hr X 2, if remains yellow, continue Q 4hrs  Escalate  MEWS: Escalate Yellow: discuss with charge nurse/RN and consider discussing with provider and RRT  Notify: Charge Nurse/RN  Name of Charge Nurse/RN Notified Marcella, RN  Date Charge Nurse/RN Notified 07/31/20  Time Charge Nurse/RN Notified 2340  Notify: Provider  Provider Name/Title Amy Cox, DO  Date Provider Notified 07/31/20  Time Provider Notified 2337  Notification Type Page  Notification Reason Change in status  Provider response See new orders  Date of Provider Response 07/31/20  Time of Provider Response 2339  Inserted for Sula Rumple RN

## 2020-08-11 ENCOUNTER — Other Ambulatory Visit: Payer: Self-pay

## 2020-08-11 ENCOUNTER — Encounter
Admission: RE | Admit: 2020-08-11 | Discharge: 2020-08-11 | Disposition: A | Discharge status: Home / Self Care | Payer: Medicare HMO | Source: Ambulatory Visit | Attending: Urology | Admitting: Urology

## 2020-08-11 DIAGNOSIS — Z01810 Encounter for preprocedural cardiovascular examination: Secondary | ICD-10-CM | POA: Insufficient documentation

## 2020-08-11 HISTORY — DX: Family history of other specified conditions: Z84.89

## 2020-08-11 NOTE — Patient Instructions (Addendum)
Your procedure is scheduled on: Monday, June 20 Report to the Registration Desk on the 1st floor of the [facility]. To find out your arrival time, please call [phone] between 1PM - 3PM on: Friday, June 17  REMEMBER: Instructions that are not followed completely may result in serious medical risk, up to and including death; or upon the discretion of your surgeon and anesthesiologist your surgery may need to be rescheduled.  Do not eat or drink anything after midnight the night before surgery.  No gum chewing, lozengers or hard candies.  TAKE THESE MEDICATIONS THE MORNING OF SURGERY WITH A SIP OF WATER:   BUSPIRONE (BUSPAR) DONEPEZIL (ARICEPT) GABAPENTIN HYRDROXYCHLOROQUINE (PLAQUENIL) LEVOTHYROXINE OMEPRAZOLE (PRILOSEC) - (take one the night before and one on the morning of surgery - helps to prevent nausea after surgery.)  One week prior to surgery: STARTING NOW Stop Anti-inflammatories (NSAIDS) such as Advil, Aleve, Ibuprofen, Motrin, Naproxen, Naprosyn and Aspirin based products such as Excedrin, Goodys Powder, BC Powder. Stop ANY OVER THE COUNTER supplements until after surgery. (VITAMIN E, PRESERVISION AREDS, VITAMIN D, VITAMIN C) You may however, continue to take Tylenol if needed for pain up until the day of surgery.  No Alcohol for 24 hours before or after surgery.  No Smoking including e-cigarettes for 24 hours prior to surgery.  No chewable tobacco products for at least 6 hours prior to surgery.  No nicotine patches on the day of surgery.  Do not use any "recreational" drugs for at least a week prior to your surgery.  Please be advised that the combination of cocaine and anesthesia may have negative outcomes, up to and including death. If you test positive for cocaine, your surgery will be cancelled.  On the morning of surgery brush your teeth with toothpaste and water, you may rinse your mouth with mouthwash if you wish. Do not swallow any toothpaste or  mouthwash.  Do not wear jewelry, make-up, hairpins, clips or nail polish.  Do not wear lotions, powders, or perfumes.   Do not shave body from the neck down 48 hours prior to surgery just in case you cut yourself which could leave a site for infection.   Do not bring valuables to the hospital. [facility] is not responsible for any missing/lost belongings or valuables.   Notify your doctor if there is any change in your medical condition (cold, fever, infection).  Wear comfortable clothing (specific to your surgery type) to the hospital.  After surgery, you can help prevent lung complications by doing breathing exercises.  Take deep breaths and cough every 1-2 hours. Your doctor may order a device called an Incentive Spirometer to help you take deep breaths.  If you are being discharged the day of surgery, you will not be allowed to drive home. You will need a responsible adult (18 years or older) to drive you home and stay with you that night.   If you are taking public transportation, you will need to have a responsible adult (18 years or older) with you. Please confirm with your physician that it is acceptable to use public transportation.   Please call the [place] Dept. at [phone] if you have any questions about these instructions.  Surgery Visitation Policy:  Patients undergoing a surgery or procedure may have one family member or support person with them as long as that person is not COVID-19 positive or experiencing its symptoms.  That person may remain in the waiting area during the procedure.

## 2020-08-11 NOTE — Progress Notes (Signed)
  Perioperative Services Pre-Admission/Anesthesia Testing   Date: 08/11/20 Name: Stacy [name] MRN:   [id number]  Re: Consideration of preoperative prophylactic antibiotic change   Request sent to: Hollice Espy, MD (routed and/or faxed via [facility])  Planned Surgical Procedure(s):    Case: [id number] Date/Time: 08/14/20 1145   Procedure: CYSTOSCOPY/URETEROSCOPY/HOLMIUM LASER/STENT PLACEMENT (Bilateral)   Anesthesia type: Choice   Pre-op diagnosis: Stent Placement   Location: [place] [facility] [place] [facility]   Surgeons: Hollice Espy, MD     Notes: Patient has a documented allergy to PCN  Advising that PCN has caused her to experience urticarial rash in the past.   Received cephalosporin with no documented complications CEFAZOLIN on 08/01/2020  Screened as appropriate for cephalosporin use during medication reconciliation No immediate angioedema, dysphagia, SOB, anaphylaxis symptoms. No severe rash involving mucous membranes or skin necrosis. No hospital admissions related to side effects of PCN/cephalosporin use.  No documented reaction to PCN or cephalosporin in the last 10 years.  Request:  As an evidence based approach to reducing the rate of incidence for post-operative SSI and the development of MDROs, could an agent with narrower coverage for preoperative prophylaxis in this patient's upcoming surgical course be considered?   Currently ordered preoperative prophylactic ABX: ciprofloxacin.   Specifically requesting change to cephalosporin (CEFAZOLIN).   Please communicate decision with me and I will change the orders in Epic as per your direction.   Things to consider: Many patients report that they were "allergic" to PCN earlier in life, however this does not translate into a true lifelong allergy. Patients can lose sensitivity to specific IgE antibodies over time if PCN is avoided (Kleris & Lugar, 2019).  Up to 10% of the adult population and 15% of  hospitalized patients report an allergy to PCN, however clinical studies suggest that 90% of those reporting an allergy can tolerate PCN antibiotics (Kleris & Lugar, 2019).  Cross-sensitivity between PCN and cephalosporins has been documented as being as high as 10%, however this estimation included data believed to have been collected in a setting where there was contamination. Newer data suggests that the prevalence of cross-sensitivity between PCN and cephalosporins is actually estimated to be closer to 1% (Hermanides et al., 2018).   Patients labeled as PCN allergic, whether they are truly allergic or not, have been found to have inferior outcomes in terms of rates of serious infection, and these patients tend to have longer hospital stays ([place], 2019).  Treatment related secondary infections, such as Clostridioides difficile, have been linked to the improper use of broad spectrum antibiotics in patients improperly labeled as PCN allergic (Kleris & Lugar, 2019).  Anaphylaxis from cephalosporins is rare and the evidence suggests that there is no increased risk of an anaphylactic type reaction when cephalosporins are used in a PCN allergic patient (Pichichero, 2006).  Citations: Hermanides J, Lemkes BA, Prins Pearla Dubonnet MW, Terreehorst I. Presumed ?-Lactam Allergy and Cross-reactivity in the Operating Theater: A Practical Approach. Anesthesiology. 2018 Aug;129(2):335-342. doi: 10.1097/ALN.0000000000002252. PMID: [id number].  Kleris, Secretary., & Lugar, P. L. (2019). Things We Do For No Reason: Failing to Question a Penicillin Allergy History. Journal of hospital medicine, 14(10), [phone]. Advance online publication. https://www.wallace-middleton.info/  Pichichero, M. E. (2006). Cephalosporins can be prescribed safely for penicillin-allergic patients. Journal of family medicine, 55(2), 106-112. Accessed: https://cdn.mdedge.com/files/s23fs-public/Document/September-2017/5502JFP_AppliedEvidence1.pdf    Honor Loh, MSN, APRN, FNP-C, CEN [facility]  Peri-operative Services Nurse Practitioner FAX: [phone] 08/11/20 9:40 AM

## 2020-08-14 ENCOUNTER — Ambulatory Visit: Payer: Medicare HMO | Admitting: Urgent Care

## 2020-08-14 ENCOUNTER — Ambulatory Visit
Admission: RE | Admit: 2020-08-14 | Discharge: 2020-08-14 | Disposition: A | Discharge status: Home / Self Care | Payer: Medicare HMO | Attending: Urology | Admitting: Urology

## 2020-08-14 ENCOUNTER — Encounter
Admission: RE | Disposition: A | Discharge status: Home / Self Care | Payer: Self-pay | Source: Home / Self Care | Attending: Urology

## 2020-08-14 ENCOUNTER — Other Ambulatory Visit: Payer: Self-pay

## 2020-08-14 ENCOUNTER — Ambulatory Visit: Payer: Medicare HMO

## 2020-08-14 ENCOUNTER — Encounter: Payer: Self-pay | Admitting: Urology

## 2020-08-14 DIAGNOSIS — Z888 Allergy status to other drugs, medicaments and biological substances status: Secondary | ICD-10-CM | POA: Insufficient documentation

## 2020-08-14 DIAGNOSIS — N200 Calculus of kidney: Secondary | ICD-10-CM | POA: Diagnosis not present

## 2020-08-14 DIAGNOSIS — Z87442 Personal history of urinary calculi: Secondary | ICD-10-CM | POA: Insufficient documentation

## 2020-08-14 DIAGNOSIS — Z79890 Hormone replacement therapy (postmenopausal): Secondary | ICD-10-CM | POA: Insufficient documentation

## 2020-08-14 DIAGNOSIS — Z880 Allergy status to penicillin: Secondary | ICD-10-CM | POA: Diagnosis not present

## 2020-08-14 DIAGNOSIS — Z79899 Other long term (current) drug therapy: Secondary | ICD-10-CM | POA: Insufficient documentation

## 2020-08-14 DIAGNOSIS — I129 Hypertensive chronic kidney disease with stage 1 through stage 4 chronic kidney disease, or unspecified chronic kidney disease: Secondary | ICD-10-CM | POA: Diagnosis not present

## 2020-08-14 DIAGNOSIS — N201 Calculus of ureter: Secondary | ICD-10-CM | POA: Insufficient documentation

## 2020-08-14 DIAGNOSIS — N189 Chronic kidney disease, unspecified: Secondary | ICD-10-CM | POA: Insufficient documentation

## 2020-08-14 HISTORY — PX: CYSTOSCOPY/URETEROSCOPY/HOLMIUM LASER/STENT PLACEMENT: SHX6546

## 2020-08-14 SURGERY — CYSTOSCOPY/URETEROSCOPY/HOLMIUM LASER/STENT PLACEMENT
Anesthesia: General | Laterality: Bilateral

## 2020-08-14 MED ORDER — OXYCODONE HCL 5 MG PO TABS: 5.0000 mg | ORAL_TABLET | Freq: Once | ORAL | Status: AC | PRN

## 2020-08-14 MED ORDER — PROMETHAZINE HCL 25 MG/ML IJ SOLN: 6.2500 mg | INTRAMUSCULAR | Status: DC | PRN

## 2020-08-14 MED ORDER — OXYCODONE HCL 5 MG/5ML PO SOLN: 5.0000 mg | Freq: Once | ORAL | Status: AC | PRN

## 2020-08-14 MED ORDER — CHLORHEXIDINE GLUCONATE 0.12 % MT SOLN
OROMUCOSAL | Status: AC
  Filled 2020-08-14: qty 15

## 2020-08-14 MED ORDER — FENTANYL CITRATE (PF) 100 MCG/2ML IJ SOLN
25.0000 ug | INTRAMUSCULAR | Status: DC | PRN
  Administered 2020-08-14 (×2): 25 ug via INTRAVENOUS

## 2020-08-14 MED ORDER — PROPOFOL 10 MG/ML IV BOLUS
INTRAVENOUS | Status: AC
  Filled 2020-08-14: qty 20

## 2020-08-14 MED ORDER — CHLORHEXIDINE GLUCONATE 0.12 % MT SOLN
15.0000 mL | Freq: Once | OROMUCOSAL | Status: AC
  Administered 2020-08-14: 15 mL via OROMUCOSAL

## 2020-08-14 MED ORDER — FENTANYL CITRATE (PF) 100 MCG/2ML IJ SOLN
INTRAMUSCULAR | Status: AC
  Filled 2020-08-14: qty 2

## 2020-08-14 MED ORDER — CIPROFLOXACIN IN D5W 400 MG/200ML IV SOLN
400.0000 mg | INTRAVENOUS | Status: AC
  Administered 2020-08-14: 400 mg via INTRAVENOUS

## 2020-08-14 MED ORDER — OXYCODONE HCL 5 MG PO TABS
ORAL_TABLET | ORAL | Status: AC
  Administered 2020-08-14: 5 mg via ORAL
  Filled 2020-08-14: qty 1

## 2020-08-14 MED ORDER — FENTANYL CITRATE (PF) 100 MCG/2ML IJ SOLN
INTRAMUSCULAR | Status: DC | PRN
  Administered 2020-08-14: 25 ug via INTRAVENOUS
  Administered 2020-08-14: 50 ug via INTRAVENOUS
  Administered 2020-08-14: 25 ug via INTRAVENOUS

## 2020-08-14 MED ORDER — ORAL CARE MOUTH RINSE: 15.0000 mL | Freq: Once | OROMUCOSAL | Status: AC

## 2020-08-14 MED ORDER — ACETAMINOPHEN 10 MG/ML IV SOLN
INTRAVENOUS | Status: DC | PRN
  Administered 2020-08-14: 1000 mg via INTRAVENOUS

## 2020-08-14 MED ORDER — PHENYLEPHRINE HCL (PRESSORS) 10 MG/ML IV SOLN
INTRAVENOUS | Status: DC | PRN
  Administered 2020-08-14: 100 ug via INTRAVENOUS

## 2020-08-14 MED ORDER — ROCURONIUM BROMIDE 10 MG/ML (PF) SYRINGE
PREFILLED_SYRINGE | INTRAVENOUS | Status: AC
  Filled 2020-08-14: qty 10

## 2020-08-14 MED ORDER — HYDROCODONE-ACETAMINOPHEN 5-325 MG PO TABS: 1.0000 | ORAL_TABLET | Freq: Four times a day (QID) | ORAL | 0 refills | Status: DC | PRN

## 2020-08-14 MED ORDER — DEXAMETHASONE SODIUM PHOSPHATE 10 MG/ML IJ SOLN
INTRAMUSCULAR | Status: DC | PRN
  Administered 2020-08-14: 10 mg via INTRAVENOUS

## 2020-08-14 MED ORDER — ONDANSETRON HCL 4 MG/2ML IJ SOLN
INTRAMUSCULAR | Status: AC
  Filled 2020-08-14: qty 2

## 2020-08-14 MED ORDER — FENTANYL CITRATE (PF) 100 MCG/2ML IJ SOLN
INTRAMUSCULAR | Status: AC
  Administered 2020-08-14: 25 ug via INTRAVENOUS
  Filled 2020-08-14: qty 2

## 2020-08-14 MED ORDER — CIPROFLOXACIN IN D5W 400 MG/200ML IV SOLN
INTRAVENOUS | Status: AC
  Filled 2020-08-14: qty 200

## 2020-08-14 MED ORDER — DEXAMETHASONE SODIUM PHOSPHATE 10 MG/ML IJ SOLN
INTRAMUSCULAR | Status: AC
  Filled 2020-08-14: qty 1

## 2020-08-14 MED ORDER — PROPOFOL 10 MG/ML IV BOLUS
INTRAVENOUS | Status: DC | PRN
  Administered 2020-08-14: 140 mg via INTRAVENOUS

## 2020-08-14 MED ORDER — MIDAZOLAM HCL 2 MG/2ML IJ SOLN
INTRAMUSCULAR | Status: AC
  Filled 2020-08-14: qty 2

## 2020-08-14 MED ORDER — LIDOCAINE HCL (PF) 2 % IJ SOLN
INTRAMUSCULAR | Status: AC
  Filled 2020-08-14: qty 6

## 2020-08-14 MED ORDER — MEPERIDINE HCL 25 MG/ML IJ SOLN: 6.2500 mg | INTRAMUSCULAR | Status: DC | PRN

## 2020-08-14 MED ORDER — ACETAMINOPHEN 10 MG/ML IV SOLN
INTRAVENOUS | Status: AC
  Filled 2020-08-14: qty 100

## 2020-08-14 MED ORDER — LACTATED RINGERS IV SOLN: INTRAVENOUS | Status: DC

## 2020-08-14 MED ORDER — MIDAZOLAM HCL 2 MG/2ML IJ SOLN
INTRAMUSCULAR | Status: DC | PRN
  Administered 2020-08-14: 2 mg via INTRAVENOUS

## 2020-08-14 MED ORDER — ROCURONIUM BROMIDE 100 MG/10ML IV SOLN
INTRAVENOUS | Status: DC | PRN
  Administered 2020-08-14: 10 mg via INTRAVENOUS
  Administered 2020-08-14: 40 mg via INTRAVENOUS

## 2020-08-14 SURGICAL SUPPLY — 28 items
BAG DRAIN CYSTO-URO LG1000N (MISCELLANEOUS) ×2 IMPLANT
BASKET ZERO TIP 1.9FR (BASKET) ×2 IMPLANT
BRUSH SCRUB EZ 1% IODOPHOR (MISCELLANEOUS) ×2 IMPLANT
BSKT STON RTRVL ZERO TP 1.9FR (BASKET) ×1
CATH URET FLEX-TIP 2 LUMEN 10F (CATHETERS) ×2 IMPLANT
CATH URETL 5X70 OPEN END (CATHETERS) ×2 IMPLANT
CNTNR SPEC 2.5X3XGRAD LEK (MISCELLANEOUS) ×1
CONT SPEC 4OZ STER OR WHT (MISCELLANEOUS) ×1
CONT SPEC 4OZ STRL OR WHT (MISCELLANEOUS) ×1
CONTAINER SPEC 2.5X3XGRAD LEK (MISCELLANEOUS) ×1 IMPLANT
DRAPE UTILITY 15X26 TOWEL STRL (DRAPES) ×2 IMPLANT
GLOVE SURG ENC MOIS LTX SZ6.5 (GLOVE) ×2 IMPLANT
GOWN STRL REUS W/ TWL LRG LVL3 (GOWN DISPOSABLE) ×2 IMPLANT
GOWN STRL REUS W/TWL LRG LVL3 (GOWN DISPOSABLE) ×4
GUIDEWIRE GREEN .038 145CM (MISCELLANEOUS) IMPLANT
GUIDEWIRE STR DUAL SENSOR (WIRE) ×4 IMPLANT
INFUSOR MANOMETER BAG 3000ML (MISCELLANEOUS) ×2 IMPLANT
IV NS IRRIG 3000ML ARTHROMATIC (IV SOLUTION) ×2 IMPLANT
KIT TURNOVER CYSTO (KITS) ×2 IMPLANT
MANIFOLD NEPTUNE II (INSTRUMENTS) IMPLANT
PACK CYSTO AR (MISCELLANEOUS) ×2 IMPLANT
SET CYSTO W/LG BORE CLAMP LF (SET/KITS/TRAYS/PACK) ×2 IMPLANT
SHEATH URETERAL 12FRX35CM (MISCELLANEOUS) ×2 IMPLANT
STENT URET 6FRX24 CONTOUR (STENTS) ×4 IMPLANT
STENT URET 6FRX26 CONTOUR (STENTS) IMPLANT
SURGILUBE 2OZ TUBE FLIPTOP (MISCELLANEOUS) ×2 IMPLANT
TRACTIP FLEXIVA PULSE ID 200 (Laser) ×2 IMPLANT
WATER STERILE IRR 1000ML POUR (IV SOLUTION) ×2 IMPLANT

## 2020-08-14 NOTE — Transfer of Care (Signed)
Immediate Anesthesia Transfer of Care Note  Patient: Stacy [name]  Procedure(s) Performed: CYSTOSCOPY/URETEROSCOPY/HOLMIUM LASER/STENT PLACEMENT (Bilateral)  Patient Location: PACU  Anesthesia Type:General  Level of Consciousness: drowsy  Airway & Oxygen Therapy: Patient Spontanous Breathing and Patient connected to face mask oxygen  Post-op Assessment: Report given to RN and Post -op Vital signs reviewed and stable  Post vital signs: Reviewed and stable  Last Vitals:  Vitals Value Taken Time  BP 119/71 08/14/20 1245  Temp    Pulse 73 08/14/20 1247  Resp 16 08/14/20 1247  SpO2 100 % 08/14/20 1247  Vitals shown include unvalidated device data.  Last Pain:  Vitals:   08/14/20 0903  PainSc: 2          Complications: No notable events documented.

## 2020-08-14 NOTE — Interval H&P Note (Signed)
History and Physical Interval Note:  08/14/2020 11:14 AM  Stacy [name]  has presented today for surgery, with the diagnosis of Stent Placement.  The various methods of treatment have been discussed with the patient and family. After consideration of risks, benefits and other options for treatment, the patient has consented to  Procedure(s): CYSTOSCOPY/URETEROSCOPY/HOLMIUM LASER/STENT PLACEMENT (Bilateral) as a surgical intervention.  The patient's history has been reviewed, patient examined, no change in status, stable for surgery.  I have reviewed the patient's chart and labs.  Questions were answered to the patient's satisfaction.    RRR CTAB  Returns for definitive management of stones   Hollice Espy

## 2020-08-14 NOTE — Anesthesia Postprocedure Evaluation (Signed)
Anesthesia Post Note  Patient: Stacy [name]  Procedure(s) Performed: CYSTOSCOPY/URETEROSCOPY/HOLMIUM LASER/STENT PLACEMENT (Bilateral)  Patient location during evaluation: PACU Anesthesia Type: General Level of consciousness: awake and alert and oriented Pain management: pain level controlled Vital Signs Assessment: post-procedure vital signs reviewed and stable Respiratory status: spontaneous breathing, nonlabored ventilation and respiratory function stable Cardiovascular status: blood pressure returned to baseline and stable Postop Assessment: no signs of nausea or vomiting Anesthetic complications: no   No notable events documented.   Last Vitals:  Vitals:   08/14/20 1315 08/14/20 1326  BP: 119/76 121/68  Pulse: 71 61  Resp: 20 18  Temp: 36.9 C (!) 36.2 C  SpO2: 99% 100%    Last Pain:  Vitals:   08/14/20 1326  TempSrc: Temporal  PainSc: 0-No pain                 Buckley Bradly

## 2020-08-14 NOTE — Discharge Instructions (Addendum)
You have a ureteral stent in place.  This is a tube that extends from your kidney to your bladder.  This may cause urinary bleeding, burning with urination, and urinary frequency.  Please call our office or present to the ED if you develop fevers >101 or pain which is not able to be controlled with oral pain medications.  You may be given either Flomax and/ or ditropan to help with bladder spasms and stent pain in addition to pain medications.    Your stent is on a string.  It is taped to your left inner thigh.  On Friday morning, you may untape and pull the string gently until the entire stent is removed.  If you have any issues, please call our office and we will be happy to [facility] [address] [place], [place] [address] [phone]  [place]   The drugs that you were given will stay in your system until tomorrow so for the next 24 hours you should not:  Drive an automobile Make any legal decisions Drink any alcoholic beverage   You may resume regular meals tomorrow.  Today it is better to start with liquids and gradually work up to solid foods.  You may eat anything you prefer, but it is better to start with liquids, then soup and crackers, and gradually work up to solid foods.   Please notify your doctor immediately if you have any unusual bleeding, trouble breathing, redness and pain at the surgery site, drainage, fever, or pain not relieved by medication.    Your post-operative visit with Dr.                                       is: Date:                        Time:    Please call to schedule your post-operative visit.  Additional Instructions:

## 2020-08-14 NOTE — Op Note (Signed)
Date of procedure: 08/14/20  Preoperative diagnosis:  Bilateral ureteral calculi  Postoperative diagnosis:  Same as above  Procedure: Bilateral ureteroscopy laser lithotripsy Bilateral ureteral stent exchange Bilateral basket extraction of stone fragment Interpretation of fluoroscopy less than 30 minutes  Surgeon: Hollice Espy, MD  Anesthesia: General  Complications: None  Intraoperative findings: 3 mm right distal stone identified, fragmented and all pieces removed via basket.  Stent exchanged on tether.  Larger left 9 mm stone also fragmented and all pieces removed.  Stent replaced on tether.  EBL: Minimal  Specimens: Stone fragment  Drains: 6 x 24 French double-J ureteral stent bilaterally  Indication: Stacy [name] is a 60 y.o. patient with an acute obstructing 9 mm left proximal stone as well as an incidental 3 mm right distal nonobstructing stone who underwent stent placement who returns today for definitive management of her stone as an outpatient.  After reviewing the management options for treatment, she elected to proceed with the above surgical procedure(s). We have discussed the potential benefits and risks of the procedure, side effects of the proposed treatment, the likelihood of the patient achieving the goals of the procedure, and any potential problems that might occur during the procedure or recuperation. Informed consent has been obtained.  Description of procedure:  The patient was taken to the operating room and general anesthesia was induced.  The patient was placed in the dorsal lithotomy position, prepped and draped in the usual sterile fashion, and preoperative antibiotics were administered. A preoperative time-out was performed.   A 21 French scope was advanced per urethra into the bladder.  Attention was turned to the right ureteral orifice from which a ureteral stent was seen emanating.  The distal coil the stent was grasped and brought to level the  urethral meatus.  The stent was then cannulated using a sensor wire up to the level of the kidney and the wire was left in place and the stent was removed.  This was snapped in place.  A semirigid ureteroscope was then advanced alongside of the wire into the distal ureter where the stone was encountered.  A 242 m laser fiber was used using settings of 0.8 J and 10 Hz to fragment the stone into 3 to 4 pieces.  Each of these pieces were then extracted using 1.9 [place] tipless final basket.  The wire was left snapped and placed throughout the remainder of the procedure and at the end of the case, a 6 x 24 French double-J ureteral stent was advanced over the wire up to the level of the kidney.  The wire was removed and a full coil was noted both within the renal pelvis as well as within the bladder.  The tether was left attached to the distal curl of the stent and taped to the patient's left inner thigh.  Next, attention was turned to the right ureteral orifice.  The stent was then grasped and brought to level urethral meatus cannulated and the stent was removed as previously described.  This time, dual-lumen access sheath was used to introduce a second Super Stiff wire up to the level of the kidney around the stone which could easily be seen on fluoroscopy.  A Cook 12/14 French ureteral access sheath was advanced over the wire up to level the proximal ureter.  The inner cannula was then removed and was just distal to the stone in optimal position.  The same laser fiber and settings were used to fragment the stone into 10-12 smaller pieces.  Each of these pieces were then extracted using a tipless nitinol basket.  Some of the pieces did E flux back into the upper tract and ultimately, I was able to chase all the fragments and remove them one by one into the entirety of the proximal ureter and kidney were cleared of all stone debris.  Each naviculars was directly visualized.  I elected not to perform a retrograde  pyelogram on this side due to contrast shortage  and minimal concern for remaining stones or injury.  The scope was then backed down the length of the ureter inspecting along the way.  No ureteral injuries or additional fragments were identified.  Lastly, using of the aforementioned technique, a ureteral stent was placed on this side as well with a curl both within the renal pelvis as well as in the bladder.  The bladder was then drained.  Both of the stent strings were affixed together to the patient's left inner thigh using Mastisol and Tegaderm.  She was then cleaned and dried, repositioned in supine position, reversed from anesthesia, and taken to the PACU in stable condition.  Plan: We will have her remove her own stent on Friday.  She will follow-up in 4 weeks thereafter with a renal ultrasound prior.  Hollice Espy, M.D.

## 2020-08-14 NOTE — Anesthesia Procedure Notes (Signed)
Procedure Name: Intubation Date/Time: 08/14/2020 11:48 AM Performed by: Doreen Salvage, CRNA Pre-anesthesia Checklist: Patient identified, Patient being monitored, Timeout performed, Emergency Drugs available and Suction available Patient Re-evaluated:Patient Re-evaluated prior to induction Oxygen Delivery Method: Circle system utilized Preoxygenation: Pre-oxygenation with 100% oxygen Induction Type: IV induction Ventilation: Mask ventilation without difficulty Laryngoscope Size: Mac, 3 and McGraph Grade View: Grade I Tube type: Oral Tube size: 7.0 mm Number of attempts: 1 Airway Equipment and Method: Stylet Placement Confirmation: ETT inserted through vocal cords under direct vision, positive ETCO2 and breath sounds checked- equal and bilateral Secured at: 21 cm Tube secured with: Tape Dental Injury: Teeth and Oropharynx as per pre-operative assessment

## 2020-08-14 NOTE — Anesthesia Preprocedure Evaluation (Signed)
Anesthesia Evaluation  Patient identified by MRN, date of birth, ID band Patient awake    Reviewed: Allergy & Precautions, NPO status , Patient's Chart, lab work & pertinent test results  History of Anesthesia Complications Negative for: history of anesthetic complications  Airway Mallampati: II  TM Distance: >3 FB Neck ROM: Full    Dental no notable dental hx.    Pulmonary neg pulmonary ROS, neg sleep apnea, neg COPD,    breath sounds clear to auscultation- rhonchi (-) wheezing      Cardiovascular Exercise Tolerance: Good (-) hypertension(-) CAD, (-) Past MI, (-) Cardiac Stents and (-) CABG  Rhythm:Regular Rate:Normal - Systolic murmurs and - Diastolic murmurs    Neuro/Psych  Headaches, neg Seizures PSYCHIATRIC DISORDERS Anxiety Depression Bipolar Disorder    GI/Hepatic Neg liver ROS, GERD  ,  Endo/Other  neg diabetesHypothyroidism   Renal/GU Renal disease: nephrolithiasis.     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 date: Chronic kidney disease     Comment:  kideny stone No date: Depression No date: Fibromyalgia No date: GERD (gastroesophageal reflux disease) No date: Headache No date: Hypotension No date: Hypothyroid No date: Memory loss No date: Migraine No date: Raynaud disease No date: Sjogren's syndrome (HCC) No date: Thyroid disease   Reproductive/Obstetrics                             Anesthesia Physical  Anesthesia Plan  ASA: 3  Anesthesia Plan: General   Post-op Pain Management:    Induction: Intravenous  PONV Risk Score and Plan: 2 and Ondansetron and Dexamethasone  Airway Management Planned: Oral ETT  Additional Equipment:   Intra-op  Plan:   Post-operative Plan: Extubation in OR  Informed Consent: I have reviewed the patients History and Physical, chart, labs and discussed the procedure including the risks, benefits and alternatives for the proposed anesthesia with the patient or authorized representative who has indicated his/her understanding and acceptance.     Dental advisory given  Plan Discussed with: CRNA and Anesthesiologist  Anesthesia Plan Comments:         Anesthesia Quick Evaluation

## 2020-08-15 ENCOUNTER — Other Ambulatory Visit: Payer: Self-pay

## 2020-08-15 ENCOUNTER — Encounter: Payer: Self-pay | Admitting: Urology

## 2020-08-15 DIAGNOSIS — N201 Calculus of ureter: Secondary | ICD-10-CM

## 2020-08-16 ENCOUNTER — Ambulatory Visit: Payer: Self-pay | Admitting: Urology

## 2020-08-17 LAB — CALCULI, WITH PHOTOGRAPH (CLINICAL LAB)
Calcium Oxalate Dihydrate: 20 %
Calcium Oxalate Monohydrate: 80 %
Weight Calculi: 8 mg

## 2020-08-23 ENCOUNTER — Other Ambulatory Visit: Payer: Self-pay | Admitting: Psychiatry

## 2020-08-23 DIAGNOSIS — F411 Generalized anxiety disorder: Secondary | ICD-10-CM

## 2020-09-04 ENCOUNTER — Ambulatory Visit: Payer: Medicare HMO | Admitting: Psychology

## 2020-09-06 ENCOUNTER — Telehealth: Payer: Medicare HMO | Admitting: Psychiatry

## 2020-09-08 ENCOUNTER — Ambulatory Visit: Payer: Medicare HMO | Admitting: Psychology

## 2020-09-12 ENCOUNTER — Ambulatory Visit
Admission: RE | Admit: 2020-09-12 | Discharge: 2020-09-12 | Disposition: A | Discharge status: Home / Self Care | Payer: Medicare HMO | Source: Ambulatory Visit | Attending: Urology | Admitting: Urology

## 2020-09-12 ENCOUNTER — Other Ambulatory Visit: Payer: Self-pay

## 2020-09-12 DIAGNOSIS — N201 Calculus of ureter: Secondary | ICD-10-CM

## 2020-09-13 ENCOUNTER — Ambulatory Visit: Payer: Medicare HMO | Admitting: Urology

## 2020-09-13 VITALS — BP 113/76 | HR 79 | Ht 67.0 in | Wt 156.0 lb

## 2020-09-13 DIAGNOSIS — N201 Calculus of ureter: Secondary | ICD-10-CM | POA: Diagnosis not present

## 2020-09-13 NOTE — Progress Notes (Signed)
09/13/2020 1:12 PM   Stacy [name] 02/08/1961 [id number]  Referring provider: Center, [facility] [address] [place],  [place] [address]  Chief Complaint  Patient presents with   Ureteral stone    HPI: 60 year old female who returns today for follow-up after ureteroscopy.  She initially presented with a 9 mm left obstructing proximal ureteral calculus as well as a right distal stone.  Initially her presentation was uncomplicated but after administration of pain medicine, she became hypotensive overnight and was transferred to the ICU.  She underwent urgent bilateral ureteral stent and ultimately recovered and was discharged.  She returned to the operating room on 08/14/2020 for definitive stone management via ureteroscopy and stent exchange.  Bilateral stones were treated and stents replaced on tether.  She subsequently removed her own stents which was uncomplicated.  She returns today with follow-up renal ultrasound.  The study was unremarkable, no residual stone burden minimal hydronephrosis.  Stone composition consistent with 80% calcium oxalate monohydrate, 20% calcium oxalate dihydrate.  She does have remote history of kidney stones requiring intervention 17 years ago.  She does drink a lot of tea.  She denies any ongoing urinary symptoms or flank pain.  PMH: Past Medical History:  Diagnosis Date   Aneurysm ([place])    Anxiety    Arthritis    Brain stem lesion    Chronic kidney disease    kideny stone   Depression    Family history of adverse reaction to anesthesia    mother has N/V   Fibromyalgia    GERD (gastroesophageal reflux disease)    Headache    Hypotension    Hypothyroid    Memory loss    Migraine    Raynaud disease    Sjogren's syndrome ([place])    Thyroid disease     Surgical History: Past Surgical History:  Procedure Laterality Date   ABDOMINAL HYSTERECTOMY     BASAL CELL CARCINOMA EXCISION     BREAST BIOPSY Left    CESAREAN SECTION      1982, 1984   CYSTOSCOPY WITH STENT PLACEMENT Bilateral 08/01/2020   Procedure: CYSTOSCOPY WITH STENT PLACEMENT;  Surgeon: Hollice Espy, MD;  Location: [facility] ORS;  Service: Urology;  Laterality: Bilateral;   CYSTOSCOPY/URETEROSCOPY/HOLMIUM LASER/STENT PLACEMENT Bilateral 08/14/2020   Procedure: CYSTOSCOPY/URETEROSCOPY/HOLMIUM LASER/STENT PLACEMENT;  Surgeon: Hollice Espy, MD;  Location: [facility] ORS;  Service: Urology;  Laterality: Bilateral;   EYE SURGERY Right    cataract   KNEE SURGERY Left    torn meniscus   LITHOTRIPSY     MUSCLE BIOPSY     OOPHORECTOMY      Home Medications:  Allergies as of 09/13/2020       Reactions   Penicillins Hives   Amoxicillin Hives   Depakote [divalproex Sodium]    vomiting   Lamictal [lamotrigine]    Side effects of abdominal cramps, nausea , diarrhea ,nightmares        Medication List        Accurate as of September 13, 2020  1:12 PM. If you have any questions, ask your nurse or doctor.          busPIRone 10 MG tablet Commonly known as: BUSPAR Take 2 tablets (20 mg total) by mouth 2 (two) times daily.   clonazePAM 0.5 MG tablet Commonly known as: KlonoPIN Take 1 tablet (0.5 mg total) by mouth daily as needed for anxiety. AS NEEDED FOR SEVERE ANXIETY ATTACKS   donepezil 10 MG tablet Commonly known as: ARICEPT Take 10  mg by mouth every morning.   gabapentin 300 MG capsule Commonly known as: NEURONTIN Take 1 capsule by mouth two times daily   HYDROcodone-acetaminophen 5-325 MG tablet Commonly known as: NORCO/VICODIN Take 1-2 tablets by mouth every 6 (six) hours as needed for moderate pain.   hydroxychloroquine 200 MG tablet Commonly known as: PLAQUENIL Take 200 mg by mouth 2 (two) times daily.   levothyroxine 175 MCG tablet Commonly known as: SYNTHROID Take 1 tablet (175 mcg total) by mouth daily before breakfast.   omeprazole 40 MG capsule Commonly known as: PRILOSEC Take 40 mg by mouth daily.   polyethylene glycol 17 g  packet Commonly known as: MIRALAX / GLYCOLAX Take 17 g by mouth daily.   PreserVision AREDS Caps Take 1 capsule by mouth daily.   SUMAtriptan 100 MG tablet Commonly known as: IMITREX Take 100 mg by mouth every 2 (two) hours as needed for migraine.   vitamin C 100 MG tablet Take 100 mg by mouth daily.   Vitamin D-3 25 MCG (1000 UT) Caps Take 1,000 Units by mouth daily.   vitamin E 45 MG (100 UNITS) capsule Take 100 Units by mouth daily.        Allergies:  Allergies  Allergen Reactions   Penicillins Hives   Amoxicillin Hives   Depakote [Divalproex Sodium]     vomiting   Lamictal [Lamotrigine]     Side effects of abdominal cramps, nausea , diarrhea ,nightmares    Family History: Family History  Problem Relation Age of Onset   Migraines Mother    Anxiety disorder Mother    Depression Mother    Cancer Father    Hypertension Father    Stroke Father    Heart attack Father    Alcohol abuse Father    Diabetes Brother    Alcohol abuse Sister    Drug abuse Sister    Anxiety disorder Sister    Depression Sister    Drug abuse Brother    Cancer Paternal Aunt    Lupus Paternal Aunt    Thyroid disease Paternal Aunt    Diabetes Maternal Grandfather    Anxiety disorder Maternal Grandmother    Depression Maternal Grandmother     Social History:  reports that she has never smoked. She has never used smokeless tobacco. She reports that she does not drink alcohol and does not use drugs.   Physical Exam: BP 113/76   Pulse 79   Ht 5\' 7"  (1.702 m)   Wt 156 lb (70.8 kg)   BMI 24.43 kg/m   Constitutional:  Alert and oriented, No acute distress. HEENT: [place] AT, moist mucus membranes.  Trachea midline, no masses. Cardiovascular: No clubbing, cyanosis, or edema. Respiratory: Normal respiratory effort, no increased work of breathing. Skin: No rashes, bruises or suspicious lesions. Neurologic: Grossly intact, no focal deficits, moving all 4 extremities. Psychiatric: Normal  mood and affect.  Laboratory Data: Lab Results  Component Value Date   CREATININE 0.97 08/01/2020    Pertinent Imaging:  Ultrasound renal complete  Narrative CLINICAL DATA:  Bilateral ureteral calculi  EXAM: RENAL / URINARY TRACT ULTRASOUND COMPLETE  COMPARISON:  CT abdomen of renal 07/31/2020  FINDINGS: Right Kidney:  Renal measurements: 10.1 x 5.6 x 4.4 cm = volume: 130 mL. Echogenicity within normal limits. No mass or hydronephrosis visualized.  Left Kidney:  Renal measurements: 10.1 x 5.2 x 5.3 cm = volume: 146 mL. Echogenicity within normal limits. No mass or hydronephrosis visualized.  Urinary bladder:  Appears normal for  degree of bladder distention.  Other:  None.  IMPRESSION: Unremarkable renal ultrasound.   Electronically Signed By: Iven Finn M.D. On: 09/12/2020 20:57  Renal ultrasound images were personally reviewed, agree with radiologic interpretation.  Normal exam.  Assessment & Plan:    1. Ureteral stone Follow-up renal ultrasound is very reassuring today, no residual stone burden or hydronephrosis  Reviewed stone analysis, primarily calcium oxalate  We discussed general stone prevention techniques including drinking plenty water with goal of producing 2.5 L urine daily, increased citric acid intake, avoidance of high oxalate containing foods, and decreased salt intake.  Information about dietary recommendations given today.  She was offered 41-ULAG urine metabolic evaluation, declined today but may consider in the future if she has recurrent stone disease  We will have her follow-up in 1 year with KUB and if this is negative without recurrent stones, we will see her mother thereafter as needed - Abdomen 1 view (KUB); Future   Hollice Espy, MD  [facility] [address] [place], [place] [address] [phone]

## 2020-09-21 ENCOUNTER — Encounter: Payer: Self-pay | Admitting: Psychiatry

## 2020-09-21 ENCOUNTER — Other Ambulatory Visit: Payer: Self-pay

## 2020-09-21 ENCOUNTER — Telehealth (INDEPENDENT_AMBULATORY_CARE_PROVIDER_SITE_OTHER): Payer: Medicare HMO | Admitting: Psychiatry

## 2020-09-21 DIAGNOSIS — F02A18 Dementia in other diseases classified elsewhere, mild, with other behavioral disturbance: Secondary | ICD-10-CM

## 2020-09-21 DIAGNOSIS — F0281 Dementia in other diseases classified elsewhere with behavioral disturbance: Secondary | ICD-10-CM

## 2020-09-21 DIAGNOSIS — F3181 Bipolar II disorder: Secondary | ICD-10-CM | POA: Diagnosis not present

## 2020-09-21 DIAGNOSIS — F411 Generalized anxiety disorder: Secondary | ICD-10-CM | POA: Diagnosis not present

## 2020-09-21 MED ORDER — PALIPERIDONE ER 1.5 MG PO TB24: 1.5000 mg | ORAL_TABLET | Freq: Every day | ORAL | 1 refills | Status: DC

## 2020-09-21 NOTE — Progress Notes (Signed)
Virtual Visit via Video Note  I connected with Stacy [name] on 09/21/20 at  9:00 AM EDT by a video enabled telemedicine application and verified that I am speaking with the correct person using two identifiers.  Location Provider Location : [facility] Patient Location : Home  Participants: Patient , Provider    I discussed the limitations of evaluation and management by telemedicine and the availability of in person appointments. The patient expressed understanding and agreed to proceed.    I discussed the assessment and treatment plan with the patient. The patient was provided an opportunity to ask questions and all were answered. The patient agreed with the plan and demonstrated an understanding of the instructions.   The patient was advised to call back or seek an in-person evaluation if the symptoms worsen or if the condition fails to improve as anticipated.   [place] MD OP Progress Note  09/21/2020 9:26 AM Stacy [name]  MRN:  [id number]  Chief Complaint:  Chief Complaint   Follow-up; Anxiety; Depression    HPI: Stacy [name] is a 60 year old Caucasian female, unemployed, married, lives in [place], has a history of bipolar disorder, GAD, major neurocognitive disorder, Sjogren's syndrome, hypertension, hypothyroidism, arthritis, migraine headache was evaluated by telemedicine today.  Patient recently had ureteral calculus , status post ureteroscopy and stent exchange.  Patient had follow-up appointment with urology-dated 09/13/2020-study unremarkable on renal ultrasound, no residual Stone with minimal hydronephrosis.  Patient today however returns reporting worsening anxiety, racing thoughts due to her health problems, being in pain and so on.  She is compliant on her BuSpar as well as takes the Klonopin as needed which helps.  She also reports sleep problems recently, sleep is restless throughout the night.  She had an episode of having hallucinations of seeing shadows a week  ago.  Patient reports she feels sad often, has trouble with concentration, overeating, being restless and so on.  She denies any suicidality, homicidality or perceptual disturbances.  Patient has been noncompliant with psychotherapy sessions due to her recent medical problems however agrees to get in touch with her therapist to restart psychotherapy sessions.  Patient continues to have memory problems, she is waiting for her neuropsychological testing which is coming up.  She continues to be on Aricept.  Patient denies any other concerns today.  Visit Diagnosis:    ICD-10-CM   1. Bipolar 2 disorder (HCC)  F31.81 Paliperidone 1.5 MG TB24   mixed,mild    2. GAD (generalized anxiety disorder)  F41.1 Paliperidone 1.5 MG TB24    3. 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  Past trials of Paxil, Latuda, Lamictal, Seroquel, Depakote, Belsomra, Zyprexa, Abilify, Vraylar  Past Medical History:  Past Medical History:  Diagnosis Date   Aneurysm ([place])    Anxiety    Arthritis    Brain stem lesion    Chronic kidney disease    kideny stone   Depression    Family history of adverse reaction to anesthesia    mother has N/V   Fibromyalgia    GERD (gastroesophageal reflux disease)    Headache    Hypotension    Hypothyroid    Memory loss    Migraine    Raynaud disease    Sjogren's syndrome ([place])    Thyroid disease     Past Surgical History:  Procedure Laterality Date   ABDOMINAL  HYSTERECTOMY     BASAL CELL CARCINOMA EXCISION     BREAST BIOPSY Left    CESAREAN SECTION     1982, 1984   CYSTOSCOPY WITH STENT PLACEMENT Bilateral 08/01/2020   Procedure: CYSTOSCOPY WITH STENT PLACEMENT;  Surgeon: Hollice Espy, MD;  Location: [facility] ORS;  Service: Urology;  Laterality: Bilateral;   CYSTOSCOPY/URETEROSCOPY/HOLMIUM LASER/STENT  PLACEMENT Bilateral 08/14/2020   Procedure: CYSTOSCOPY/URETEROSCOPY/HOLMIUM LASER/STENT PLACEMENT;  Surgeon: Hollice Espy, MD;  Location: [facility] OR[facility];  Service: Urology;  Laterality: Bilateral;   EYE SURGERY Right    cataract   KNEE SURGERY Left    torn meniscus   LITHOTRIPSY     MUSCLE BIOPSY     OOPHORECTOMY      Family Psychiatric History: Reviewed family psychiatric history from progress note on 12/10/2017.  Family History:  Family History  Problem Relation Age of Onset   Migraines Mother    Anxiety disorder Mother    Depression Mother    Cancer Father    Hypertension Father    Stroke Father    Heart attack Father    Alcohol abuse Father    Diabetes Brother    Alcohol abuse Sister    Drug abuse Sister    Anxiety disorder Sister    Depression Sister    Drug abuse Brother    Cancer Paternal Aunt    Lupus Paternal Aunt    Thyroid disease Paternal Aunt    Diabetes Maternal Grandfather    Anxiety disorder Maternal Grandmother    Depression Maternal Grandmother     Social History: Reviewed social history from progress note on 12/10/2017. Social History   Socioeconomic History   Marital status: Married    Spouse name: Stacy [name]   Number of children: 2   Years of education: 14   Highest education level: Associate degree: occupational, Hotel manager, or vocational program  Occupational History   Occupation: Disabled  Tobacco Use   Smoking status: Never   Smokeless tobacco: Never  Vaping Use   Vaping Use: Never used  Substance and Sexual Activity   Alcohol use: No    Alcohol/week: 0.0 standard drinks   Drug use: Never   Sexual activity: Yes    Birth control/protection: Surgical  Other Topics Concern   Not on file  Social History Narrative   Lives at home with husband. Stacy [name]).    Disabled.   Education college.   Right handed.   2 cups caffeine/daily   Social Determinants of Health   Financial Resource Strain: Not on file  Food Insecurity: Not on file   Transportation Needs: Not on file  Physical Activity: Not on file  Stress: Not on file  Social Connections: Not on file    Allergies:  Allergies  Allergen Reactions   Penicillins Hives   Amoxicillin Hives   Depakote [Divalproex Sodium]     vomiting   Lamictal [Lamotrigine]     Side effects of abdominal cramps, nausea , diarrhea ,nightmares    Metabolic Disorder Labs: Lab Results  Component Value Date   HGBA1C 5.0 11/13/2015   No results found for: PROLACTIN No results found for: CHOL, TRIG, HDL, CHOLHDL, VLDL, LDLCALC Lab Results  Component Value Date   TSH 1.780 10/26/2015   TSH 0.525 03/15/2015    Therapeutic Level Labs: No results found for: LITHIUM Lab Results  Component Value Date   VALPROATE 10 (L) 07/28/2019   No components found for:  CBMZ  Current Medications: Current Outpatient Medications  Medication Sig Dispense Refill   Ascorbic  Acid (VITAMIN C) 100 MG tablet Take 100 mg by mouth daily.     busPIRone (BUSPAR) 10 MG tablet Take 2 tablets (20 mg total) by mouth 2 (two) times daily. 120 tablet 1   Cholecalciferol (VITAMIN D-3) 1000 units CAPS Take 1,000 Units by mouth daily.     clonazePAM (KLONOPIN) 0.5 MG tablet Take 1 tablet (0.5 mg total) by mouth daily as needed for anxiety. AS NEEDED FOR SEVERE ANXIETY ATTACKS 15 tablet 2   donepezil (ARICEPT) 10 MG tablet Take 10 mg by mouth every morning.     gabapentin (NEURONTIN) 300 MG capsule Take 1 capsule by mouth two times daily 180 capsule 2   HYDROcodone-acetaminophen (NORCO/VICODIN) 5-325 MG tablet Take 1-2 tablets by mouth every 6 (six) hours as needed for moderate pain. 10 tablet 0   hydroxychloroquine (PLAQUENIL) 200 MG tablet Take 200 mg by mouth 2 (two) times daily.     levothyroxine (SYNTHROID, LEVOTHROID) 175 MCG tablet Take 1 tablet (175 mcg total) by mouth daily before breakfast. 90 tablet 3   Multiple Vitamins-Minerals (PRESERVISION AREDS) CAPS Take 1 capsule by mouth daily.     omeprazole  (PRILOSEC) 40 MG capsule Take 40 mg by mouth daily.     Paliperidone 1.5 MG TB24 Take 1 tablet (1.5 mg total) by mouth daily. 15 tablet 1   polyethylene glycol (MIRALAX / GLYCOLAX) 17 g packet Take 17 g by mouth daily. 14 each 0   SUMAtriptan (IMITREX) 100 MG tablet Take 100 mg by mouth every 2 (two) hours as needed for migraine.     vitamin E 45 MG (100 UNITS) capsule Take 100 Units by mouth daily.     No current facility-administered medications for this visit.     Musculoskeletal: Strength & Muscle Tone:  UTA Gait & Station:  UTA Patient leans: N/A  Psychiatric Specialty Exam: Review of Systems  Constitutional:  Positive for fatigue.  Psychiatric/Behavioral:  Positive for decreased concentration, dysphoric mood and sleep disturbance. The patient is nervous/anxious.   All other systems reviewed and are negative.  There were no vitals taken for this visit.There is no height or weight on file to calculate BMI.  General Appearance: Casual  Eye Contact:  Fair  Speech:  Clear and Coherent  Volume:  Normal  Mood:  Anxious and Depressed  Affect:  Congruent  Thought Process:  Goal Directed and Descriptions of Associations: Intact  Orientation:  Full (Time, Place, and Person)  Thought Content: Logical   Suicidal Thoughts:  No  Homicidal Thoughts:  No  Memory:  Immediate;   Fair Recent;   Fair Remote;   Limited  Judgement:  Fair  Insight:  Fair  Psychomotor Activity:  Normal  Concentration:  Concentration: Fair and Attention Span: Fair  Recall:  [employer] of Knowledge: Fair  Language: Fair  Akathisia:  No  Handed:  Right  AIMS (if indicated): not done  Assets:  Communication Skills Desire for Improvement Social Support  ADL's:  Intact  Cognition: WNL  Sleep:  Poor   Screenings: [place] Office Visit from 06/15/2018 in [place] Office Visit from 05/20/2018 in [place] Total Score 5 9       GAD-7    Flowsheet Row Video Visit from 09/21/2020 in [place] Video Visit from 07/26/2020 in [facility] from 11/11/2019 in [place]  Total GAD-7 Score 12 8 18  [place] Office Visit from 01/01/2016 in [facility] Office Visit from 11/13/2015 in [facility] Office Visit from 10/26/2015 in [facility] Office Visit from 04/25/2015 in [facility] Office Visit from 10/25/2014 in [facility]  Total Score (max 30 points ) '25 26 21 23 29      '$ PHQ2-9    Flowsheet Row Video Visit from 09/21/2020 in [place] Video Visit from 07/26/2020 in [place] [facility] from 02/17/2020 in [facility] Counselor from 11/11/2019 in [place] Office Visit from 02/08/2015 in [facility]  PHQ-2 Total Score '2 1 6 6 '$ 0  PHQ-9 Total Score 12 -- 18 21 --      Flowsheet Row Video Visit from 09/21/2020 in [place] ED to Hosp-Admission (Discharged) from 07/31/2020 in [place] (1C) Video Visit from 07/26/2020 in [place] No Risk No Risk No Risk        Assessment and Plan: Yadirah Meltzer is a 60 year old Caucasian female who has a history of bipolar disorder type II, hypothyroidism, cognitive disorder, insomnia, Sjogren's syndrome, fibromyalgia was evaluated by telemedicine today.  Patient with psychosocial stressors of the current pandemic, her own medical problems.  Patient is currently struggling with psychosis, anxiety, sleep problems, depression.  She will benefit from the following plan.  Plan Bipolar disorder mixed-unstable Gabapentin as prescribed Start  Invega extended release 1.5 mg p.o. daily Provided medication education Patient with psychosis unknown if this is due to her neurocognitive problems versus secondary to bipolar disorder.   GAD-unstable BuSpar 20 mg p.o. twice daily Continue Klonopin as needed. Hydroxyzine 25 mg daily as needed for severe anxiety attacks Patient advised to restart psychotherapy sessions.  Neurocognitive disorder with behavioral changes-patient has been referred for neuropsychological testing.  Genesight testing results were reviewed while making medication changes  Reviewed notes per urology dated 09/13/20  Follow-up in clinic in 10 days or sooner if needed.  This note was generated in part or whole with voice recognition software. Voice recognition is usually quite accurate but there are transcription errors that can and very often do occur. I apologize for any typographical errors that were not detected and corrected.        Ursula Alert, MD 09/22/2020, 5:14 PM

## 2020-09-21 NOTE — Patient Instructions (Signed)
Paliperidone oral tablets, extend release What is this medication? PALIPERIDONE (pal ee PER i done) is used to treat schizophrenia andschizoaffective disorder. This medicine may be used for other purposes; ask your health care provider orpharmacist if you have questions. COMMON BRAND NAME(S): Invega What should I tell my care team before I take this medication? They need to know if you have any of these conditions: dementia diabetes difficulty swallowing have trouble controlling your muscles heart disease high cholesterol history of breast cancer history of irregular heartbeat history of stroke kidney disease liver disease low blood counts, like low white cell, platelet, or red cell counts low blood pressure Parkinson's disease seizures stomach or intestinal problems an unusual or allergic reaction to paliperidone, risperidone, other medicines, foods, dyes, or preservatives pregnant or trying to get pregnant breast-feeding How should I use this medication? Take this medicine by mouth with a glass of water. Follow the directions on the prescription label. Do not chew, crush, or cut the tablets. You can take this medicine with or without food. Take your doses at regular intervals. Do not take your medicine more often than directed. Do not stop taking except on yourprescriber's advice. A special MedGuide will be given to you by the pharmacist with eachprescription and refill. Be sure to read this information carefully each time. Talk to your pediatrician regarding the use of this medicine in children. While this drug may be prescribed for children as young as 15 years old for selectedconditions, precautions do apply. Overdosage: If you think you have taken too much of this medicine contact apoison control center or emergency room at once. NOTE: This medicine is only for you. Do not share this medicine with others. What if I miss a dose? If you miss a dose, take it as soon as you can. If  it is almost time for yournext dose, take only that dose. Do not take double or extra doses. What may interact with this medication? Do not take this medicine with any of the following medications: cisapride dronedarone metoclopramide pimozide thioridazine This medicine may also interact with the following medications: alcohol antihistamines for allergy, cough, and cold carbamazepine certain medicines for anxiety or sleep certain medicines for depression like amitriptyline, fluoxetine, paroxetine, sertraline certain medicines for fungal infections like fluconazole, posaconazole general anesthetics like halothane, isoflurane, methoxyflurane, propofol levodopa or other medicines for Parkinson's disease medicines for blood pressure medicines for seizures medicines that relax muscles for surgery narcotic medicines for pain other medicines that prolong the QT interval (cause an abnormal heart rhythm) phenothiazines like chlorpromazine, prochlorperazine This list may not describe all possible interactions. Give your health care provider a list of all the medicines, herbs, non-prescription drugs, or dietary supplements you use. Also tell them if you smoke, drink alcohol, or use illegaldrugs. Some items may interact with your medicine. What should I watch for while using this medication? Visit your health care professional for regular checks on your progress. Tell your health care professional if symptoms do not start to get better or if they get worse. Do not stop taking except on your health care professional's advice. You may develop a severe reaction. Your health care professional will tell youhow much medicine to take. You may get drowsy or dizzy. Do not drive, use machinery, or do anything that needs mental alertness until you know how this medicine affects you. Do not stand or sit up quickly, especially if you are an older patient. This reduces the risk of dizzy or fainting spells. Alcohol  may interfere with the effect ofthis medicine. Avoid alcoholic drinks. This drug can cause problems with controlling your body temperature. It can lower the response of your body to cold temperatures. If possible, stay indoors during cold weather. If you must go outdoors, wear warm clothes. It can also lower the response of your body to heat. Do not overheat. Do not over-exercise. Stay out of the sun when possible. If you must be in the sun, wear cool clothing. Drink plenty of water. If you have trouble controlling your bodytemperature, call your health care provider right away. This medicine may increase blood sugar. Ask your health care provider ifchanges in diet or medicines are needed if you have diabetes. The tablet shell for some brands of this medicine does not dissolve. This is normal. The tablet shell may appear whole in the stool. This is not a cause forconcern. What side effects may I notice from receiving this medication? Side effects that you should report to your doctor or health care professionalas soon as possible: allergic reactions like skin rash, itching or hives, swelling of the face, lips, or tongue breast enlargement in both males and females breathing problems confusion fast, irregular heartbeat fever or chills, sore throat inability to keep still males: prolonged or painful erection missed menstrual periods problems with balance, talking, walking seizures signs and symptoms of high blood sugar such as being more thirsty or hungry or having to urinate more than normal. You may also feel very tired or have blurry vision signs and symptoms of low blood pressure like dizziness; feeling faint or lightheaded, falls; unusually weak or tired signs and symptoms of neuroleptic malignant syndrome like confusion; fast or irregular heartbeat; high fever; increased sweating; stiff muscles sudden numbness or weakness of the face, arm, or leg suicidal thoughts or other mood  changes trouble swallowing uncontrollable movements of the arms, face, head, mouth, neck, or upper body Side effects that usually do not require medical attention (report to yourdoctor or health care professional if they continue or are bothersome): change in sex drive or performance drowsiness dry mouth headache nausea This list may not describe all possible side effects. Call your doctor for medical advice about side effects. You may report side effects to FDA at1-800-FDA-1088. Where should I keep my medication? Keep out of the reach of children. Store at room temperature between 15 and 30 degrees C (59 and 86 degrees F).Protect from moisture. Throw away any unused medicine after the expiration date. NOTE: This sheet is a summary. It may not cover all possible information. If you have questions about this medicine, talk to your doctor, pharmacist, orhealth care provider.  2022 Elsevier/Gold Standard (2019-09-20 21:26:16)

## 2020-09-26 ENCOUNTER — Encounter: Payer: Medicare HMO | Attending: Psychology

## 2020-09-26 ENCOUNTER — Other Ambulatory Visit: Payer: Self-pay

## 2020-09-26 DIAGNOSIS — F3181 Bipolar II disorder: Secondary | ICD-10-CM | POA: Insufficient documentation

## 2020-09-26 DIAGNOSIS — F411 Generalized anxiety disorder: Secondary | ICD-10-CM | POA: Insufficient documentation

## 2020-09-26 DIAGNOSIS — F02A18 Dementia in other diseases classified elsewhere, mild, with other behavioral disturbance: Secondary | ICD-10-CM

## 2020-09-26 DIAGNOSIS — F0281 Dementia in other diseases classified elsewhere with behavioral disturbance: Secondary | ICD-10-CM | POA: Insufficient documentation

## 2020-09-26 NOTE — Progress Notes (Deleted)
   Neuropsychology Note  [name] completed *** minutes of neuropsychological testing with technician, Dina Rich, BA, under the supervision of Ilean Skill, PsyD., Clinical Neuropsychologist. The patient did not appear overtly distressed by the testing session, per behavioral observation or via self-report to the technician. Rest breaks were offered.   Clinical Decision Making: In considering the patient's current level of functioning, level of presumed impairment, nature of symptoms, emotional and behavioral responses during clinical interview, level of literacy, and observed level of motivation/effort, a battery of tests was selected by Dr. Sima Matas during initial consultation on ***. This was communicated to the technician. Communication between the neuropsychologist and technician was ongoing throughout the testing session and changes were made as deemed necessary based on patient performance on testing, technician observations and additional pertinent factors such as those listed above.  Tests Administered: [employer], 3rd Edition (CVLT-3); Standard Form or Brief Form  Clock Drawing Test Comprehensive Attention Battery (CAB) Continuous Performance Test (CPT) Controlled Oral Word Association Test (COWAT; FAS & Animals)  [employer]is Designer, television/film set System (D-KEFS), Select subtests Finger Tapping Test (FTT) Grooved Pegboard Hand Dynamometer  Modified [employer] Test (M-WCST) Repeatable Battery for the Assessment of Neuropsychological Status (RBANS); Form A/B/C/D Trail Making Test (TMT; Part A & B) [place] Functional Living Scale (TFLS) Wechsler Adult Intelligence Scale, 4th Edition (WAIS-IV) Wechsler Memory Scale, 4th Edition (WMS-IV); Adult Battery or Older Adult Battery  Wechsler Individual Achievement Test, 4th Edition (WIAT-4) WRAT-5 Word Reading  [employer] (WCST)  Results: Will be included in final report    Feedback to Patient: Stacy [name] will return on *** for an interactive feedback session with Dr. Sima Matas at which time her test performances, clinical impressions and treatment recommendations will be reviewed in detail. The patient understands she can contact our office should she require our assistance before this time.  *** minutes spent face-to-face with patient administering standardized tests, *** minutes spent scoring (technician). [CPT [id number], [id number]  Full report to follow.

## 2020-09-26 NOTE — Progress Notes (Signed)
Behavioral Observations  The patient appeared well-groomed and appropriately dressed for the testing session. Her manners were polite and appropriate to the situation. The patient demonstrated a positive attitude toward testing and a high level of cooperation.The patient appeared to give her best effort although she seemed timid and unsure of her answers throughout the test.  Neuropsychology Note  Stacy [name] completed 150 minutes of neuropsychological testing with technician, Dina Rich, BA, under the supervision of Ilean Skill, PsyD., Clinical Neuropsychologist. The patient did not appear overtly distressed by the testing session, per behavioral observation or via self-report to the technician. Rest breaks were offered.   Clinical Decision Making: In considering the patient's current level of functioning, level of presumed impairment, nature of symptoms, emotional and behavioral responses during clinical interview, level of literacy, and observed level of motivation/effort, a battery of tests was selected by Dr. Sima Matas during initial consultation on 07/25/2020. This was communicated to the technician. Communication between the neuropsychologist and technician was ongoing throughout the testing session and changes were made as deemed necessary based on patient performance on testing, technician observations and additional pertinent factors such as those listed above.  Tests Administered: Controlled Oral Word Association Test (COWAT; FAS & Animals)  Wechsler Adult Intelligence Scale, 4th Edition (WAIS-IV) Wechsler Memory Scale, 4th Edition (WMS-IV); Adult Battery    Results:  COWAT FAS total = 22 Animals = 13   WAIS-IV  Composite Score Summary  Scale Sum of Scaled Scores Composite Score Percentile Rank 95% Conf. Interval Qualitative Description  Verbal Comprehension 19 VCI 80 9 75-86 Low Average  Perceptual Reasoning 22 PRI 84 14 79-91 Low Average  Working Memory 11  WMI 74 4 69-82 Borderline  Processing Speed 17 PSI 92 30 84-101 Average  Full Scale 69 FSIQ 79 8 75-83 Borderline  General Ability 41 GAI 80 9 76-86 Low Average      Verbal Comprehension Subtests Summary  Subtest Raw Score Scaled Score Percentile Rank Reference Group Scaled Score SEM  Similarities '13 5 5 4 '$ 1.08  Vocabulary 40 10 50 11 0.73  Information '5 4 2 5 '$ 0.67  (Comprehension) 23 9 37 10 1.08       Perceptual Reasoning Subtests Summary  Subtest Raw Score Scaled Score Percentile Rank Reference Group Scaled Score SEM  Block Design 33 9 37 7 1.04  Matrix Reasoning '9 6 9 4 '$ 0.95  Visual Puzzles '9 7 16 6 '$ 0.99  (Figure Weights) '9 7 16 6 '$ 0.99  (Picture Completion) 11 9 37 8 1.12       Working Doctor, general practice Raw Score Scaled Score Percentile Rank Reference Group Scaled Score SEM  Digit Span '17 5 5 4 '$ 0.85  Arithmetic '9 6 9 6 '$ 1.04  (Letter-Number Seq.) '16 8 25 7 '$ 1.08       Processing Speed Subtests Summary  Subtest Raw Score Scaled Score Percentile Rank Reference Group Scaled Score SEM  Symbol Search '22 7 16 6 '$ 1.31  Coding 58 10 50 7 0.99  (Cancellation) '23 6 9 4 '$ 1.34     WMS - Adult Battery  Index Score Summary  Index Sum of Scaled Scores Index Score Percentile Rank 95% Confidence Interval Qualitative Descriptor  Auditory Memory (AMI) 26 80 9 75-87 Low Average  Visual Memory (VMI) 36 93 32 88-99 Average  Visual Working Memory (VWMI) 19 97 42 90-104 Average  Immediate Memory (IMI) 33 87 19 81-94 Low Average  Delayed Memory (DMI) 29 81 10 75-89 Low Average  Primary Subtest Scaled Score Summary  Subtest Domain Raw Score Scaled Score Percentile Rank  Logical Memory I AM '21 8 25  '$ Logical Memory II AM '12 6 9  '$ Verbal Paired Associates I AM '16 6 9  '$ Verbal Paired Associates II AM '5 6 9  '$ Designs I VM 58 9 37  Designs II VM 54 11 63  Visual Reproduction I VM 33 10 50  Visual Reproduction II VM '10 6 9  '$ Spatial Addition VWM 12 11 63   Symbol Span VWM '16 8 25      '$ Auditory Memory Process Score Summary  Process Score Raw Score Scaled Score Percentile Rank Cumulative Percentage (Base Rate)  LM II Recognition 14 - - <=2%  VPA II Recognition 27 - - <=2%       Visual Memory Process Score Summary  Process Score Raw Score Scaled Score Percentile Rank Cumulative Percentage (Base Rate)  DE I Content '28 7 16 '$ -  DE I Spatial 18 11 63 -  DE II Content 35 11 63 -  DE II Spatial 11 10 50 -  DE II Recognition 9 - - 3-9%  VR II Recognition 4 - - 17-25%      ABILITY-MEMORY ANALYSIS  Ability Score:  GAI: 80 Date of Testing:  WAIS-IV; WMS-IV 2020/09/26  Predicted Difference Method   Index Predicted WMS-IV Index Score Actual WMS-IV Index Score Difference Critical Value  Significant Difference Y/N Base Rate  Auditory Memory 89 80 9 8.95 Y 25%  Visual Memory 88 93 -5 8.82 N   Visual Working Memory 87 97 -10 11.24 N   Immediate Memory 87 87 0 10.35 N   Delayed Memory 88 81 7 10.08 N   Statistical significance (critical value) at the .01 level.     Feedback to Patient: Amorah Telford will return on 01/03/2021 for an interactive feedback session with Dr. Sima Matas at which time her test performances, clinical impressions and treatment recommendations will be reviewed in detail. The patient understands she can contact our office should she require our assistance before this time.  150 minutes spent face-to-face with patient administering standardized tests, 30 minutes spent scoring Environmental education officer). [CPT [id number], [id number]  Full report to follow.

## 2020-09-29 ENCOUNTER — Encounter: Payer: Medicare HMO | Admitting: Psychology

## 2020-10-02 ENCOUNTER — Other Ambulatory Visit: Payer: Self-pay

## 2020-10-02 ENCOUNTER — Telehealth: Payer: Medicare HMO | Admitting: Psychiatry

## 2020-10-03 ENCOUNTER — Encounter: Payer: Self-pay | Admitting: Psychology

## 2020-10-03 ENCOUNTER — Encounter (HOSPITAL_BASED_OUTPATIENT_CLINIC_OR_DEPARTMENT_OTHER): Payer: Medicare HMO | Admitting: Psychology

## 2020-10-03 DIAGNOSIS — F0281 Dementia in other diseases classified elsewhere with behavioral disturbance: Secondary | ICD-10-CM

## 2020-10-03 DIAGNOSIS — F02A18 Dementia in other diseases classified elsewhere, mild, with other behavioral disturbance: Secondary | ICD-10-CM

## 2020-10-03 DIAGNOSIS — F3181 Bipolar II disorder: Secondary | ICD-10-CM | POA: Diagnosis not present

## 2020-10-03 DIAGNOSIS — F411 Generalized anxiety disorder: Secondary | ICD-10-CM

## 2020-10-03 NOTE — Progress Notes (Signed)
Neuropsychological Evaluation   Patient:  Stacy [name]   DOB: [birth year]  MR Number: [id number]  Location: [facility] PHYS[facility] MEDICINE AND REHABILITATION [place], [place] [id number] [place] [id number] Dept: [phone]  Start: 4 PM End: 5 PM  Provider/Observer:     Edgardo Roys PsyD  Chief Complaint:      Chief Complaint  Patient presents with   Memory Loss   Hallucinations   Anxiety   Depression   Pain   Other    Abnormal findings on MRI but no progression or change since 2015.     Reason For Service:     Maahi Bayha 60 year old Caucasian female that was referred for neuropsychological evaluation by her treating psychiatrist  Saramma, MD due to reports of ongoing and progressing memory changes.  The patient is currently unemployed, married, lives in [place], has a history of bipolar disorder, GAD, major neurocognitive disorder including complaints of memory deficits, Sjogren's syndrome, hypertension, hypothyroidism, arthritis, and migraine headaches was evaluated in-person today for neuropsychological consultation.  The patient has been struggling with a number of issues including her bipolar disorder type II, insomnia, fibromyalgia and other cognitive difficulties.  She has been struggling recently with psychosis, anxiety, sleep problems and depression.  It is unclear how much of her psychosis type symptoms are due to neurocognitive problems versus secondary to bipolar disorder.  The patient reports that she feels like she has had progression in her cognitive difficulties and first noticed difficulties around 8 or 9 years ago.  She reports that over the past year she feels like they have gotten significantly worse but reports that she has had difficulties for some time.  The patient denies a correlation between any mood disturbance and worsening or improvement in her cognitive functioning.   However, these difficulties may have been present for at least 10 years at this point.   The following portions of the patient's history were reviewed and updated as appropriate: allergies, current medications, past family history, past medical history, past social history, past surgical history and problem list.   Onset/Course: 10 years ago. Insidious onset with gradual worsening.    Upon direct questioning, the patient reported:   Forgetting recent conversations/events: Yes worse over the last 10 years.  Repeating statements/questions: Yes worse over the last 10 years.  Misplacing/losing items: Isabell Jarvis, personal belongings, household items. Tries to put things in same place with husbands assistance but still has problems.  Forgetting appointments or other obligations: Uses/checks calendar daily. Able to manage using this strategy.  Forgetting to take medications: Endorsed difficulty remembering to take them regularly. Uses alarm clock to help but will forget if side tracked.    Difficulty concentrating: Gradual worsening in the last 10 years.  Starting but not finishing tasks: Started 10 years ago but worse in the last year  Distracted easily: Gradual worsening in the last 10 years.  Processing information more slowly: Gradual worsening in the last 10 years.    Word-finding difficulty: Minimal trouble  Word substitutions: On occasion.  Writing difficulty: Penmanship has gotten worse. Organization of writing has declined. Not able to read own notes.  Spelling difficulty: Yes, change  Comprehension difficulty: Has to re-read multiple times. Generally able to comprehend what she reads and hears. Mathematics: Change in the last    Getting lost when driving: On occasion due to inattention.  Making wrong turns when driving: Yes. More in the past year.  Uncertain about directions  when driving or passenger: Typically don't pay attention as passenger.    Family neuro hx: None reported.  Any  family hx dementia? None reported.    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    Complex ADLs Driving: Able to drive but does become distracted more easily. Admits to wandering off on occasion. Uses GPS to prevent getting lost.  Medication management: Management of finances: Appointments: Cooking: Able to cook but choose not to. Had trouble following directions 6-7 months ago. Has "given up on cooking". Used to make dinner before. Husband cooks   Medical/Physical complaints: Any hx of stroke/TIA, MI, LOC/TBI, Sz? Denies  Hx falls? Denies  Balance, probs walking? Denies  Sleep: Insomnia? OSA? CPAP? REM sleep beh sx? Typically gets 6-7 hours per night. Wake up several times. Feel restless. No previous sleep study.   Go to bedroom around 8:00PM to watch TV for around 1-2 hours. Eventually drift off while TV is on. Wake up to turn TV off. Able to get back to sleep relatively quick but then wake up after a few hours.  Visual illusions/hallucinations? VH onset 1 year ago (i.e., shadow people). Started happening at night when looking out doorway in house. Then moved to where it was perceived in the bedroom. Had a couple times when perceived shadow people were charging towards her face. Typically feels scared when having these distortions. Close eyes and reassure self that "its not there"; afraid to look. Has reportedly experienced this around 12 x in last year but not in the last 2 months.    Appetite/Nutrition/Weight changes: Limiting diet to preferred foods in the last  6 months.     Current mood: "ok",  "average", "not sad". Last time happy- few days ago when participating in motorcycle rally with husband.       Behavioral disturbance/Personality change: Denies    Suicidal Ideation/Intention: Denies   History of depression, anxiety, other MH disorder: Patient reports a  history of depression and possible hypomanic episodes.  She however was never diagnosed with bipolar disorder in the past.  She reports a diagnosis of generalized anxiety disorder in the past as well as cognitive impairment.  She was under psychotherapy treatment in the past.  Has days where she cries uncontrollably. Started around 2 years ago. Starts feeling sad out of the blue lasts a 2-3 days then resolves. Ok then for like 4-5 days. Cant sit and relax "I got to do something". Restless. Gets tired. Denies decreased need for sleep.   History of MH treatment: Past trials of Paxil, Wellbutrin, Latuda, Lamictal, Seroquel, Depakote, Belsomra, Zyprexa, Abilify, Vraylar History of SI: Denies  History of substance: dependence/treatment: Denies  MRI of Brain on 09/10/17 showed the following impressions: 1. Normal NeuroQuant study. No evidence of brain atrophy or degenerative change. 2. 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 Differential diagnosis is complicated cyst, hamartoma or low-grade tumor.  Tests Administered: Controlled Oral Word Association Test (COWAT; FAS & Animals) Wechsler Adult Intelligence Scale, 4th Edition (WAIS-IV) Wechsler Memory Scale, 4th Edition (WMS-IV); Adult Battery    Participation Level:   Active  Participation Quality:  Appropriate      Behavioral Observation:  Well Groomed, Alert, and Appropriate.   Test Results:   Initially, an estimation was made to historic/premorbid functioning.  The patient reports that she had always struggled in school and had to work  hard just to get by and received mostly C's.  The patient took supplementary reading comprehension courses doing to having trouble in the seventh grade and dropped out of high school in the 11th grade later earning her GED 2 years later.  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  Patient struggled with completing work  in a timely manner and missing work task.  A conservative estimate will be made for the patient's historic/premorbid functioning following somewhere in the  average range and we will utilize a composite T score estimation of 100 as a comparison point for any potential changes in premorbid/historical functioning.  The patient potentially had some learning disabilities in reading comprehension and would likely be reflected in measures such as her general fund of information as she is avoided various learning tasks through the years.  COWAT FAS total = 22  z-score=  0.1 Animals = 13  z-score=  -1.2   The patient reports that she does fairly well as far as word finding abilities and objective assessment of verbal fluency and expressive language abilities fell within normal limits and there was no indication of any word finding difficulties or expressive language deficits.  Face-to-face interactions with the patient did not indicate any receptive or expressive language deficits.   WAIS-IV             Composite Score Summary         Scale Sum of Scaled Scores Composite Score Percentile Rank 95% Conf. Interval Qualitative Description  Verbal Comprehension 19 VCI 80 9 75-86 Low Average  Perceptual Reasoning 22 PRI 84 14 79-91 Low Average  Working Memory 11 WMI 74 4 69-82 Borderline  Processing Speed 17 PSI 92 30 84-101 Average  Full Scale 69 FSIQ 79 8 75-83 Borderline  General Ability 41 GAI 80 9 76-86 Low Average    The patient was administered the Wechsler Adult Intelligence Scale-IV to get an objective well normed standardized measure of a broad range of various cognitive domains.  However, because the patient is reporting changes in her cognitive functioning it is cautioned when using the current assessment to describe lifelong cognitive functioning globally.  The patient produced a full-scale IQ score of 79 which falls at the 8th percentile and is in the borderline range.  This is likely a  mild decrease in overall cognitive functioning representing roughly 11 point decline but does suggest 1 or more areas of cognitive changes.  We also calculated the patient's general abilities index score which places less emphasis on working memory which was her most impaired score but also places less emphasis on information processing speed which was her best area of functioning and these essentially canceled each other out.  The patient produced a general abilities index score of 80 which falls at the 9th percentile and in the low average range.  This is slightly below predicted levels of historic/premorbid functioning.         Verbal Comprehension Subtests Summary      Subtest Raw Score Scaled Score Percentile Rank Reference Group Scaled Score SEM  Similarities '13 5 5 4 '$ 1.08  Vocabulary 40 10 50 11 0.73  Information '5 4 2 5 '$ 0.67  (Comprehension) 23 9 37 10 1.08    The patient produced a verbal comprehension index score of 80 which falls at the 9th percentile and is in the low average range.  This is lower than predicted levels and  some aspects although given the patient's history of lower performance on her general fund of information and verbal reasoning and problem-solving is not unexpected.  The patient performed in the average range with regard to her vocabulary knowledge and vocabulary skills as well as in the average range with regard to her social judgment comprehension.           Perceptual Reasoning Subtests Summary     Subtest Raw Score Scaled Score Percentile Rank Reference Group Scaled Score SEM  Block Design 33 9 37 7 1.04  Matrix Reasoning '9 6 9 4 '$ 0.95  Visual Puzzles '9 7 16 6 '$ 0.99  (Figure Weights) '9 7 16 6 '$ 0.99  (Picture Completion) 11 9 37 8 1.12    The patient produced a perceptual reasoning index score of 84 which fell at the 14th percentile and was in the low average range.  This is only slightly below predicted levels.  There was only subtle variability in subtest  performance.  The patient performed the average range on measures of visual analysis and organizational abilities and her ability to identify visual anomalies within a gestalt.  This last measure (picture completion) is an item that could be affected by her region of the brain identified on MRI related to superior colliculus and potential midbrain abnormalities.  The patient showed significant difficulties on issues related to visual reasoning and problem-solving, visual estimation and visual judgment.          Working Electrical engineer Raw Score Scaled Score Percentile Rank Reference Group Scaled Score SEM  Digit Span '17 5 5 4 '$ 0.85  Arithmetic '9 6 9 6 '$ 1.04  (Letter-Number Seq.) '16 8 25 7 '$ 1.08    The patient showed consistent deficits on measures of auditory encoding with particular focus on difficulties with primary encoding of auditory information but she was able to manipulate and hold onto those limited stores adequately but there was a significant On how much encoded information she could attend to and maintain and conscious stores.          Processing Speed Subtests Summary      Subtest Raw Score Scaled Score Percentile Rank Reference Group Scaled Score SEM  Symbol Search '22 7 16 6 '$ 1.31  Coding 58 10 50 7 0.99  (Cancellation) '23 6 9 4 '$ 1.34    The patient shows considerable variability on measures of visual scanning, visual searching and overall speed of mental operations.  This pattern could easily be explained by some of her midbrain abnormalities noted on MRI as they have particular focus on visual scanning and automatic eyes movements.  The patient displayed greater difficulties when scanning horizontally versus scanning vertically.   WMS - Adult Battery           Index Score Summary       Index Sum of Scaled Scores Index Score Percentile Rank 95% Confidence Interval Qualitative Descriptor  Auditory Memory (AMI) 26 80 9 75-87 Low Average  Visual Memory (VMI) 36  93 32 88-99 Average  Visual Working Memory (VWMI) 19 97 42 90-104 Average  Immediate Memory (IMI) 33 87 19 81-94 Low Average  Delayed Memory (DMI) 29 81 10 75-89 Low Average    The patient was also administered the Wechsler Memory Scale-IV 2 obtain an objective assessment of a wide range of memory and learning functions.  Overall, the patient's memory functions generally were consistent are even better than many of her other cognitive domains no  testing.  On the Wechsler Adult Intelligence Scale the patient showed significant weaknesses and deficits for auditory encoding/working memory functions as she produced a composite score of 74 in this domain.  On the Wechsler memory scales the patient produced a visual working memory score of 97 which fell at the 42nd percentile and was in the average range and was actually her best area of performance.  This would suggest that auditory encoding is more impaired than visual encoding.  Breaking the patient's memory functions down between auditory memory versus visual memory the patient produced an auditory memory index score of 80 which falls at the 9th percentile and is of low average range.  This is below predicted levels based on education occupational history and does suggest some deficits consistent with subjective reports by the patient.  However, the patient produced a visual working memory index score of 93 which fell at the 32nd percentile.  This level of visual memory is consistent with her well retained visual encoding capacity and suggest a good ability to encode, store and organize new visual learning and memory components.  Breaking the patient's memory functions down between immediate versus delayed memory the patient produced an immediate memory index score of 87 which fell at the 19th percentile and was in the low average range.  Again, there was significant difference between visual versus auditory memory and learning in this immediate memory is  essentially a composite of these 2 scores.  The patient produced a delayed memory index score of 81 which fell at the 10th percentile and was in the low average range.  The patient's performance did not readily improve under recognition or cued recall formats suggesting that she is having difficulty with auditory encoding and auditory storage and organization capacity with generally well-preserved visual encoding and visual memory abilities.         Primary Subtest Scaled Score Summary     Subtest Domain Raw Score Scaled Score Percentile Rank  Logical Memory I AM '21 8 25  '$ Logical Memory II AM '12 6 9  '$ Verbal Paired Associates I AM '16 6 9  '$ Verbal Paired Associates II AM '5 6 9  '$ Designs I VM 58 9 37  Designs II VM 54 11 63  Visual Reproduction I VM 33 10 50  Visual Reproduction II VM '10 6 9  '$ Spatial Addition VWM 12 11 63  Symbol Span VWM '16 8 25            '$ Auditory Memory Process Score Summary      Process Score Raw Score Scaled Score Percentile Rank Cumulative Percentage (Base Rate)  LM II Recognition 14 - - <=2%  VPA II Recognition 27 - - <=2%             Visual Memory Process Score Summary     Process Score Raw Score Scaled Score Percentile Rank Cumulative Percentage (Base Rate)  DE I Content '28 7 16 '$ -  DE I Spatial 18 11 63 -  DE II Content 35 11 63 -  DE II Spatial 11 10 50 -  DE II Recognition 9 - - 3-9%  VR II Recognition 4 - - 17-25%      ABILITY-MEMORY ANALYSIS   Ability Score:    GAI: 80 Date of Testing:           WAIS-IV; WMS-IV 2020/09/26             Predicted Difference Method    Index Predicted WMS-IV Index Score Actual  WMS-IV Index Score Difference Critical Value   Significant Difference Y/N Base Rate  Auditory Memory 89 80 9 8.95 Y 25%  Visual Memory 88 93 -5 8.82 N    Visual Working Memory 87 97 -10 11.24 N    Immediate Memory 87 87 0 10.35 N    Delayed Memory 88 81 7 10.08 N    Statistical significance (critical value) at the .01 level.        Summary of Results:   Overall, the results of the current neuropsychological evaluation are consistent with subjective complaints of difficulties with memory and learning although this was primarily found in the auditory domain with visual memory and learning well-preserved and consistent with predicted levels.  The patient shows good information processing speed including good visual scanning, visual searching abilities but she did show some deficits in visual processing related to visual reasoning and problem-solving, visual estimation and visual analysis and organizational skills.  However, the patient showed significant deficits with regard to verbal reasoning and problem-solving and her ability to retrieve likely previously learned information.  Impression/Diagnosis:   The results of the current neuropsychological evaluation are consistent with subjective reports of multiple areas of changes in cognitive functioning.  Memory deficits were primarily identified in the auditory memory functions and the patient's visual memory appears to be within normal limits and consistent with predicted levels of premorbid functioning.  The patient's visual encoding capacity is quite good while her auditory encoding capacity is quite poor.  The patient shows executive functioning deficits particularly for visual and verbal reasoning capacity and visual estimation, judgment and problem solving his ability.  However, the patient reports that she has been noting deficits in her cognitive functioning for 8 to 10 years now and while she identifies a significant worsening in the past year these deficits began to be identified when she was in her late 64s or early 40s.  The patient has significant psychiatric illness including difficulty managing and treating her bipolar 2 disorder and significant episodes of depressive events.  The patient had an MRI conducted in 2019 that showed no evidence of brain atrophy or  neurodegenerative changes.  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  While this particular brain region could explain some of her visual spatial deficits and automatic visual eye movements impacting measures of information processing speed it should not have any particular deleterious effect on higher cognitive functioning and memory.   As far as diagnostic impressions and considerations, the patient's abnormalities found on her MRI would only potentially explain some of her subjective symptoms reported and could account for only some types of cognitive changes noted.  This particular area of the brain that abnormalities have been found primarily correspond to functions related to automatic eye movements and direct automatic behavior responses of the arm or eyes to particular points and body centered space.  It oversees directing I have movements and plays a role in generating spatially directed head turns, arm reaching movements and also can involve in shifting attention to various environmental stimuli particularly others' faces and automatic integration of eye movements.  These include deficits noted on visual scanning and visual searching measures particularly when visually scanning horizontally while maintaining vertical scanning fairly efficiently.  While it could explain some of her deficits related to misplacing items and forgetting where she put them and difficulties with issues related to geographic orientation and driving skills and driving attentional components, it does not  explain many of the other cognitive issues that are noted.  Given the course and pattern of neuropsychological strengths and weaknesses this does not appear to be consistent with a progressive degenerative condition although this is the first formal neuropsychological assessment conducted.  While the patient describes some visual hallucinations/experiences late at night  when she is trying to go to bed there are no visual hallucinations during the day.  The patient does not have any indication of tremor or other motor changes.  The pattern of overall subjective and objective findings are not consistent with conditions such as Alzheimer's dementia or Lewy body dementia.  Other cortical or subcortical dementias are also not consistent with current results.  I do think that the most likely explanation of her cognitive difficulties and the course of these difficulties would be residual impacts of significant underlying psychiatric issues including bipolar disorder and significant depression along with some insomnia etc.  While the current pattern in its totality is not consistent with a progressive neurodegenerative condition she does meet a criterion for a mild major neurocognitive disorder due to major etiological factors with the primary etiological factor being underlying psychiatric illness that has been difficult to manage and treat.  It will be important to continue to work and look at issues related to her bipolar disorder.  We should also look at retesting the patient in approximately 9 or 10 months to assess for any progressive decline as a progressive pattern cannot be completely ruled out with current testing.  We have scheduled the patient for a follow-up appointment in approximately 9 months.   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 Clinical Neuropsychologist

## 2020-10-11 ENCOUNTER — Encounter: Payer: Self-pay | Admitting: Psychology

## 2020-10-11 ENCOUNTER — Encounter: Payer: Medicare HMO | Admitting: Psychology

## 2020-10-11 ENCOUNTER — Other Ambulatory Visit: Payer: Self-pay

## 2020-10-11 DIAGNOSIS — F0281 Dementia in other diseases classified elsewhere with behavioral disturbance: Secondary | ICD-10-CM

## 2020-10-11 DIAGNOSIS — F3181 Bipolar II disorder: Secondary | ICD-10-CM | POA: Diagnosis not present

## 2020-10-11 DIAGNOSIS — F411 Generalized anxiety disorder: Secondary | ICD-10-CM

## 2020-10-11 DIAGNOSIS — F02A18 Dementia in other diseases classified elsewhere, mild, with other behavioral disturbance: Secondary | ICD-10-CM

## 2020-10-11 NOTE — Progress Notes (Signed)
10/11/2020 1 PM-2 PM:  Today's visit was an in person visit that was conducted in my outpatient clinic office with the patient myself present.  Today was the first time I have met the patient in person as the evaluation was initiated by Dr. Darol Destine and he left the practice to move to [facility] before this assessment was completed.  I did take the opportunity during the feedback session to address more clinical information and asked more pertinent directed questions to be included into her complete neuropsychological evaluation that was completed last week.  I have included some of that information and that evaluation.  The patient's complete neuropsychological evaluation can be found in her EMR dated 10/03/2020.  Below I will include a copy of the impression/summary of that evaluation for convenience.  Overall, this does not appear consistent with a specific neurodegenerative process either cortical or subcortical and most likely represents sustained difficulties over the past 8 to 10 years with some recent worsening in the symptoms but not clearly related to any cortical or subcortical degenerative types of process.  It will be important to reassess the patient in approximately 9 months to be able to allow more sophisticated and sensitive direct comparisons to assess for any progressive changes in her neuropsychological and cognitive functioning.  Impression/Diagnosis:   The results of the current neuropsychological evaluation are consistent with subjective reports of multiple areas of changes in cognitive functioning.  Memory deficits were primarily identified in the auditory memory functions and the patient's visual memory appears to be within normal limits and consistent with predicted levels of premorbid functioning.  The patient's visual encoding capacity is quite good while her auditory encoding capacity is quite poor.  The patient shows executive functioning deficits particularly for visual and verbal  reasoning capacity and visual estimation, judgment and problem solving his ability.  However, the patient reports that she has been noting deficits in her cognitive functioning for 8 to 10 years now and while she identifies a significant worsening in the past year these deficits began to be identified when she was in her late 19s or early 59s.  The patient has significant psychiatric illness including difficulty managing and treating her bipolar 2 disorder and significant episodes of depressive events.  The patient had an MRI conducted in 2019 that showed no evidence of brain atrophy or neurodegenerative changes.  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  While this particular brain region could explain some of her visual spatial deficits and automatic visual eye movements impacting measures of information processing speed it should not have any particular deleterious effect on higher cognitive functioning and memory.   As far as diagnostic impressions and considerations, the patient's abnormalities found on her MRI would only potentially explain some of her subjective symptoms reported and could account for only some types of cognitive changes noted.  This particular area of the brain that abnormalities have been found primarily correspond to functions related to automatic eye movements and direct automatic behavior responses of the arm or eyes to particular points and body centered space.  It oversees directing I have movements and plays a role in generating spatially directed head turns, arm reaching movements and also can involve in shifting attention to various environmental stimuli particularly others' faces and automatic integration of eye movements.  These include deficits noted on visual scanning and visual searching measures particularly when visually scanning horizontally while maintaining vertical scanning fairly efficiently.  While  it could  explain some of her deficits related to misplacing items and forgetting where she put them and difficulties with issues related to geographic orientation and driving skills and driving attentional components, it does not explain many of the other cognitive issues that are noted.  Given the course and pattern of neuropsychological strengths and weaknesses this does not appear to be consistent with a progressive degenerative condition although this is the first formal neuropsychological assessment conducted.  While the patient describes some visual hallucinations/experiences late at night when she is trying to go to bed there are no visual hallucinations during the day.  The patient does not have any indication of tremor or other motor changes.  The pattern of overall subjective and objective findings are not consistent with conditions such as Alzheimer's dementia or Lewy body dementia.  Other cortical or subcortical dementias are also not consistent with current results.  I do think that the most likely explanation of her cognitive difficulties and the course of these difficulties would be residual impacts of significant underlying psychiatric issues including bipolar disorder and significant depression along with some insomnia etc.  While the current pattern in its totality is not consistent with a progressive neurodegenerative condition she does meet a criterion for a mild major neurocognitive disorder due to major etiological factors with the primary etiological factor being underlying psychiatric illness that has been difficult to manage and treat.  It will be important to continue to work and look at issues related to her bipolar disorder.  We should also look at retesting the patient in approximately 9 or 10 months to assess for any progressive decline as a progressive pattern cannot be completely ruled out with current testing.  We have scheduled the patient for a follow-up appointment in approximately 9  months.   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 Clinical Neuropsychologist

## 2020-10-24 ENCOUNTER — Ambulatory Visit: Payer: Medicare HMO | Admitting: Psychology

## 2020-10-25 ENCOUNTER — Encounter: Payer: Self-pay | Admitting: Psychiatry

## 2020-10-25 ENCOUNTER — Telehealth (INDEPENDENT_AMBULATORY_CARE_PROVIDER_SITE_OTHER): Payer: Medicare HMO | Admitting: Psychiatry

## 2020-10-25 ENCOUNTER — Other Ambulatory Visit: Payer: Self-pay

## 2020-10-25 DIAGNOSIS — F3181 Bipolar II disorder: Secondary | ICD-10-CM | POA: Diagnosis not present

## 2020-10-25 DIAGNOSIS — F0281 Dementia in other diseases classified elsewhere with behavioral disturbance: Secondary | ICD-10-CM | POA: Diagnosis not present

## 2020-10-25 DIAGNOSIS — F411 Generalized anxiety disorder: Secondary | ICD-10-CM | POA: Diagnosis not present

## 2020-10-25 DIAGNOSIS — F02A18 Dementia in other diseases classified elsewhere, mild, with other behavioral disturbance: Secondary | ICD-10-CM

## 2020-10-25 MED ORDER — PALIPERIDONE ER 1.5 MG PO TB24: 1.5000 mg | ORAL_TABLET | Freq: Every day | ORAL | 1 refills | Status: DC

## 2020-10-25 MED ORDER — BUSPIRONE HCL 10 MG PO TABS: 20.0000 mg | ORAL_TABLET | Freq: Two times a day (BID) | ORAL | 1 refills | Status: DC

## 2020-10-25 MED ORDER — CLONAZEPAM 0.5 MG PO TABS: 0.5000 mg | ORAL_TABLET | Freq: Every day | ORAL | 2 refills | Status: DC | PRN

## 2020-10-25 NOTE — Progress Notes (Signed)
Virtual Visit via Video Note  I connected with Stacy [name] on 10/25/20 at  9:30 AM EDT by a video enabled telemedicine application and verified that I am speaking with the correct person using two identifiers.  Location Provider Location : [facility] Patient Location : Home  Participants: Patient , Provider    I discussed the limitations of evaluation and management by telemedicine and the availability of in person appointments. The patient expressed understanding and agreed to proceed.    I discussed the assessment and treatment plan with the patient. The patient was provided an opportunity to ask questions and all were answered. The patient agreed with the plan and demonstrated an understanding of the instructions.   The patient was advised to call back or seek an in-person evaluation if the symptoms worsen or if the condition fails to improve as anticipated.  Video connection was lost at more than 50% of the duration of the visit, at which time the remainder of the visit was completed through audio only                                                               [facility] MD OP Progress Note  10/25/2020 9:54 AM Stacy [name]  MRN:  [id number]  Chief Complaint:  Chief Complaint   Follow-up; Anxiety    HPI: Stacy [name] is a 60 year old Caucasian female, unemployed, married, lives in [place], has a history of bipolar disorder, GAD, major neurocognitive disorder, Sjogren's syndrome, hypertension, hypothyroidism, arthritis, migraine headache was evaluated by telemedicine today.  Patient is currently recovering from COVID-19 infection, tested positive a week and a half ago.  Patient reports she had severe fatigue, sore throat, sneezing initially however she is feeling much better now.  She continues to struggle with fatigue although recovering.  Patient reports she is depressed.  She reports she feels sad, tired and ruminates.  Patient also has been sleeping excessively.  She  reports she is no longer taking the [place].  She reports she felt initially the [facility] may have caused her to have excessive tiredness and sleepiness during the day however now she is not sure since she got tested positive with COVID-19 soon after that.  She had stopped taking the Invega a few weeks ago however reports she is willing to give it a trial again.  She denies any auditory or visual hallucinations, does not appear to be preoccupied with any delusions at this time.  Patient completed neuropsychological testing with Dr. Tommi Rumps 10/03/2020.  Testing report was reviewed with the patient.  Visit Diagnosis:    ICD-10-CM   1. Bipolar 2 disorder (HCC)  F31.81 Paliperidone 1.5 MG TB24   mixed,mild    2. GAD (generalized anxiety disorder)  F41.1 clonazePAM (KLONOPIN) 0.5 MG tablet    busPIRone (BUSPAR) 10 MG tablet    Paliperidone 1.5 MG TB24    3. 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  Past trials of Paxil, Latuda, Lamictal, Seroquel, Depakote, Belsomra, Zyprexa, Abilify, Vraylar  Past Medical History:  Past Medical History:  Diagnosis Date   Aneurysm ([place])    Anxiety    Arthritis    Brain  stem lesion    Chronic kidney disease    kideny stone   Depression    Family history of adverse reaction to anesthesia    mother has N/V   Fibromyalgia    GERD (gastroesophageal reflux disease)    Headache    Hypotension    Hypothyroid    Memory loss    Migraine    Raynaud disease    Sjogren's syndrome ([place])    Thyroid disease     Past Surgical History:  Procedure Laterality Date   ABDOMINAL HYSTERECTOMY     BASAL CELL CARCINOMA EXCISION     BREAST BIOPSY Left    CESAREAN SECTION     1982, 1984   CYSTOSCOPY WITH STENT PLACEMENT Bilateral 08/01/2020   Procedure: CYSTOSCOPY WITH STENT PLACEMENT;  Surgeon:  Hollice Espy, MD;  Location: [facility] ORS;  Service: Urology;  Laterality: Bilateral;   CYSTOSCOPY/URETEROSCOPY/HOLMIUM LASER/STENT PLACEMENT Bilateral 08/14/2020   Procedure: CYSTOSCOPY/URETEROSCOPY/HOLMIUM LASER/STENT PLACEMENT;  Surgeon: Hollice Espy, MD;  Location: [facility] ORS;  Service: Urology;  Laterality: Bilateral;   EYE SURGERY Right    cataract   KNEE SURGERY Left    torn meniscus   LITHOTRIPSY     MUSCLE BIOPSY     OOPHORECTOMY      Family Psychiatric History: Reviewed family psychiatric history from progress note on 12/10/2017  Family History:  Family History  Problem Relation Age of Onset   Migraines Mother    Anxiety disorder Mother    Depression Mother    Cancer Father    Hypertension Father    Stroke Father    Heart attack Father    Alcohol abuse Father    Diabetes Brother    Alcohol abuse Sister    Drug abuse Sister    Anxiety disorder Sister    Depression Sister    Drug abuse Brother    Cancer Paternal Aunt    Lupus Paternal Aunt    Thyroid disease Paternal Aunt    Diabetes Maternal Grandfather    Anxiety disorder Maternal Grandmother    Depression Maternal Grandmother     Social History: Reviewed social history from progress note on 12/10/2017 Social History   Socioeconomic History   Marital status: Married    Spouse name: [name]   Number of children: 2   Years of education: 14   Highest education level: Associate degree: occupational, Hotel manager, or vocational program  Occupational History   Occupation: Disabled  Tobacco Use   Smoking status: Never   Smokeless tobacco: Never  Vaping Use   Vaping Use: Never used  Substance and Sexual Activity   Alcohol use: No    Alcohol/week: 0.0 standard drinks   Drug use: Never   Sexual activity: Yes    Birth control/protection: Surgical  Other Topics Concern   Not on file  Social History Narrative   Lives at home with husband. [name]).    Disabled.   Education college.   Right handed.   2 cups  caffeine/daily   Social Determinants of Health   Financial Resource Strain: Not on file  Food Insecurity: Not on file  Transportation Needs: Not on file  Physical Activity: Not on file  Stress: Not on file  Social Connections: Not on file    Allergies:  Allergies  Allergen Reactions   Penicillins Hives   Amoxicillin Hives   Depakote [Divalproex Sodium]     vomiting   Lamictal [Lamotrigine]     Side effects of abdominal cramps, nausea , diarrhea ,nightmares  Metabolic Disorder Labs: Lab Results  Component Value Date   HGBA1C 5.0 11/13/2015   No results found for: PROLACTIN No results found for: CHOL, TRIG, HDL, CHOLHDL, VLDL, LDLCALC Lab Results  Component Value Date   TSH 1.780 10/26/2015   TSH 0.525 03/15/2015    Therapeutic Level Labs: No results found for: LITHIUM Lab Results  Component Value Date   VALPROATE 10 (L) 07/28/2019   No components found for:  CBMZ  Current Medications: Current Outpatient Medications  Medication Sig Dispense Refill   Ascorbic Acid (VITAMIN C) 100 MG tablet Take 100 mg by mouth daily.     busPIRone (BUSPAR) 10 MG tablet Take 2 tablets (20 mg total) by mouth 2 (two) times daily. 120 tablet 1   Cholecalciferol (VITAMIN D-3) 1000 units CAPS Take 1,000 Units by mouth daily.     clonazePAM (KLONOPIN) 0.5 MG tablet Take 1 tablet (0.5 mg total) by mouth daily as needed for anxiety. AS NEEDED FOR SEVERE ANXIETY ATTACKS 15 tablet 2   donepezil (ARICEPT) 10 MG tablet Take 10 mg by mouth every morning.     gabapentin (NEURONTIN) 300 MG capsule Take 1 capsule by mouth two times daily 180 capsule 2   HYDROcodone-acetaminophen (NORCO/VICODIN) 5-325 MG tablet Take 1-2 tablets by mouth every 6 (six) hours as needed for moderate pain. 10 tablet 0   hydroxychloroquine (PLAQUENIL) 200 MG tablet Take 200 mg by mouth 2 (two) times daily.     levothyroxine (SYNTHROID, LEVOTHROID) 175 MCG tablet Take 1 tablet (175 mcg total) by mouth daily before  breakfast. 90 tablet 3   Multiple Vitamins-Minerals (PRESERVISION AREDS) CAPS Take 1 capsule by mouth daily.     omeprazole (PRILOSEC) 40 MG capsule Take 40 mg by mouth daily.     Paliperidone 1.5 MG TB24 Take 1 tablet (1.5 mg total) by mouth daily. 30 tablet 1   polyethylene glycol (MIRALAX / GLYCOLAX) 17 g packet Take 17 g by mouth daily. 14 each 0   SUMAtriptan (IMITREX) 100 MG tablet Take 100 mg by mouth every 2 (two) hours as needed for migraine.     vitamin E 45 MG (100 UNITS) capsule Take 100 Units by mouth daily.     No current facility-administered medications for this visit.     Musculoskeletal: Strength & Muscle Tone:  UTA Gait & Station: Seated Patient leans: N/A  Psychiatric Specialty Exam: Review of Systems  Constitutional:  Positive for fatigue.  HENT:  Positive for sneezing and sore throat.   Psychiatric/Behavioral:  Positive for dysphoric mood and sleep disturbance. The patient is nervous/anxious.   All other systems reviewed and are negative.  There were no vitals taken for this visit.There is no height or weight on file to calculate BMI.  General Appearance: Casual  Eye Contact:  Fair  Speech:  Clear and Coherent  Volume:  Normal  Mood:  Anxious and Depressed  Affect:  Congruent  Thought Process:  Goal Directed and Descriptions of Associations: Intact  Orientation:  Full (Time, Place, and Person)  Thought Content: Logical   Suicidal Thoughts:  No  Homicidal Thoughts:  No  Memory:  Immediate;   Fair Recent;   Fair Remote;   Fair  Judgement:  Fair  Insight:  Fair  Psychomotor Activity:  Normal  Concentration:  Concentration: Fair and Attention Span: Fair  Recall:  [employer] of Knowledge: Fair  Language: Fair  Akathisia:  No  Handed:  Right  AIMS (if indicated): done  Assets:  Communication Skills  Desire for Improvement Housing Intimacy Social Support  ADL's:  Intact  Cognition: WNL  Sleep:   excessive   Screenings: AIMS    Flowsheet Row  Office Visit from 06/15/2018 in [place] Office Visit from 05/20/2018 in [place] Total Score 5 9      GAD-7    Flowsheet Row Video Visit from 09/21/2020 in [place] Video Visit from 07/26/2020 in [place] from 11/11/2019 in [place]  Total GAD-7 Score '12 8 18      '$ [place] Office Visit from 01/01/2016 in [facility] Office Visit from 11/13/2015 in [facility] Office Visit from 10/26/2015 in [facility] Office Visit from 04/25/2015 in [facility] Office Visit from 10/25/2014 in [facility]  Total Score (max 30 points ) '25 26 21 23 29      '$ PHQ2-9    Flowsheet Row Video Visit from 09/21/2020 in [place] Video Visit from 07/26/2020 in [place] [facility] from 02/17/2020 in [facility] from 11/11/2019 in [place] Office Visit from 02/08/2015 in [facility]  PHQ-2 Total Score '2 1 6 6 '$ 0  PHQ-9 Total Score 12 -- 18 21 --      Flowsheet Row Video Visit from 09/21/2020 in [place] [facility] to Hosp-Admission (Discharged) from 07/31/2020 in [place] (1C) Video Visit from 07/26/2020 in [place] No Risk No Risk No Risk        Assessment and Plan: Tonantzin Assante is a 60 year old Caucasian female who has a history of bipolar disorder type II, hypothyroidism, cognitive disorder, insomnia, Sjogren's syndrome, fibromyalgia was evaluated by telemedicine today.  Patient is currently recovering from COVID-19 infection, continues to struggle with depression.  Plan as noted  below.  Plan Bipolar disorder mixed-unstable Restart Invega extended release 1.5 mg p.o. daily Continue gabapentin as prescribed-prescribed by neurology.  GAD-improving BuSpar 20 mg p.o. twice daily Continue Klonopin 0.5 mg as needed for severe anxiety attacks only.  She has been limiting use Hydroxyzine 25 mg p.o. daily as needed for severe anxiety attacks Patient was advised to restart psychotherapy sessions-encouraged to do so.  Mild neurocognitive disorder with behavioral disturbance-patient completed neuropsychological testing dated 10/11/2020-I have reviewed and discussed Dr. Ferne Coe notes with patient today. ' Given the course and pattern of neuropsychology, strengths and weakness it does not appear to be consistent with a progressive degenerative condition although this is the first formal neuropsychological assessment contacted.  Pattern of overall subjective and objective findings are not consistent with conditions like Alzheimer's dementia or Lewy body dementia.  Other cortical or subcortical dementias are also not consistent with current results.  I do think that the most likely explanation of her cognitive difficulties in the course of these difficulties would be residual impact of significant underlying psychiatric issues including bipolar disorder, significant depression along with insomnia.'  -Continue Donepezil as prescribed. -Continue follow-up with neurology. We will coordinate care.  Follow-up in clinic in 4 weeks or sooner if needed.  This note was generated in part or whole with voice recognition software. Voice recognition is usually quite accurate but there are transcription errors that can and very often do occur. I apologize for any typographical errors that were not detected and corrected.       Shalay Carder  Evely Gainey, MD 10/26/2020, 8:40 AM

## 2020-10-26 IMAGING — CT CT ABDOMEN AND PELVIS WITH CONTRAST
2 of 5 series · 15 of 46 positions shown, 17 images · IV contrast (APPLIED)
Comparison: None.

CLINICAL DATA: Status post hysterectomy.

EXAM:
CT ABDOMEN AND PELVIS WITH CONTRAST
TECHNIQUE: Multidetector CT imaging of the abdomen and pelvis was performed
using the standard protocol following bolus administration of
intravenous contrast.
CONTRAST:  100mL OMNIPAQUE IOHEXOL 300 MG/ML  SOLN

[Series 2: routine abd/pel with · axial · 0.67mm/px · z∈[-697,-282]mm · 12 of 93 slices shown, 14 images]
[im 5/93  soft-tissue]
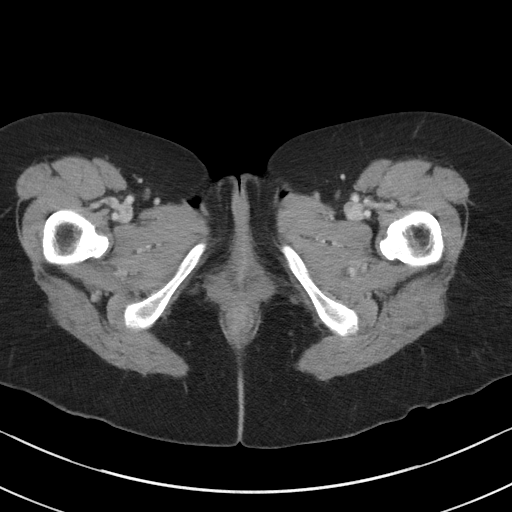
[im 5/93  bone]
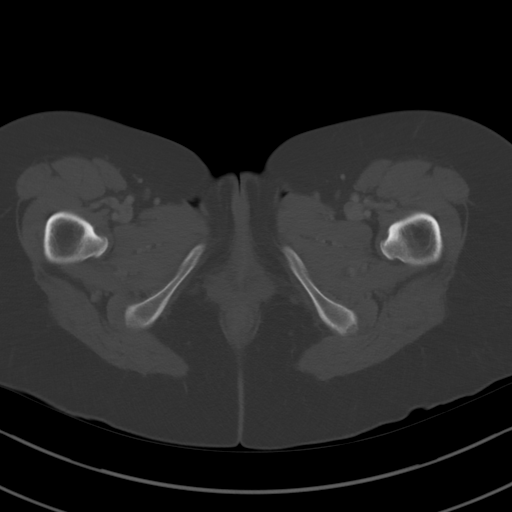
[im 15/93  soft-tissue]
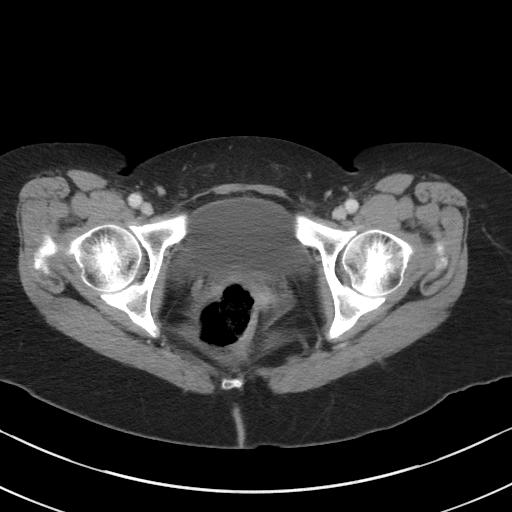
[im 20/93  soft-tissue]
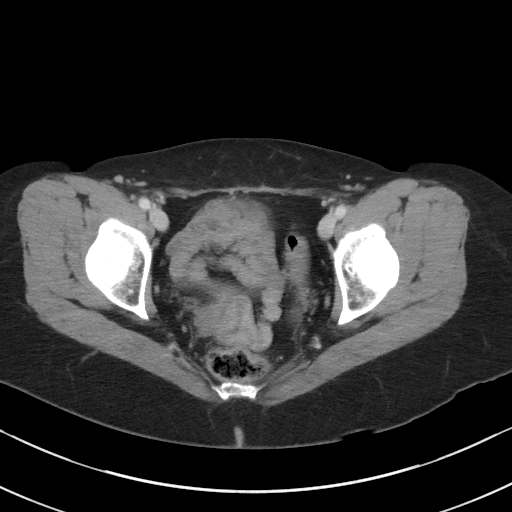
[im 30/93  soft-tissue]
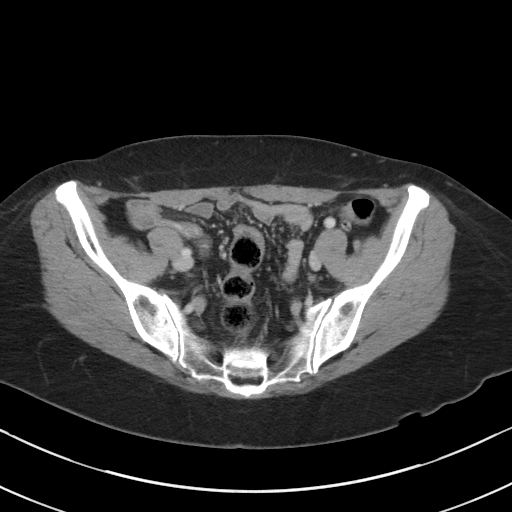
[im 34/93  soft-tissue]
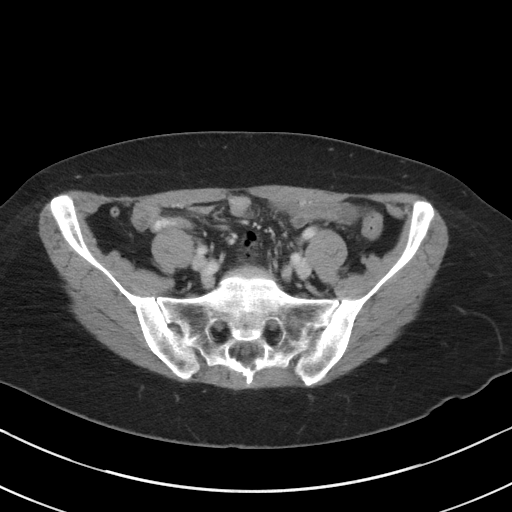
[im 44/93  soft-tissue]
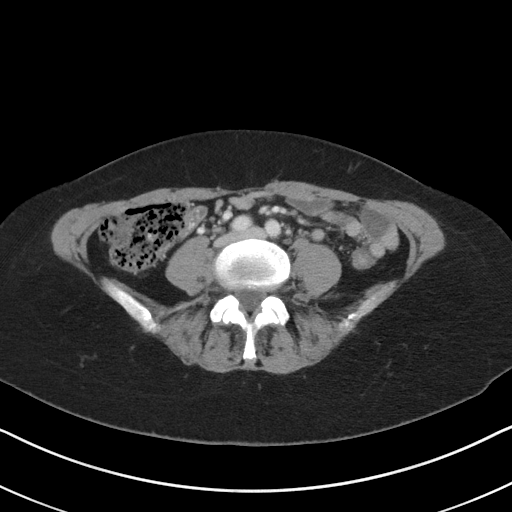
[im 49/93  soft-tissue]
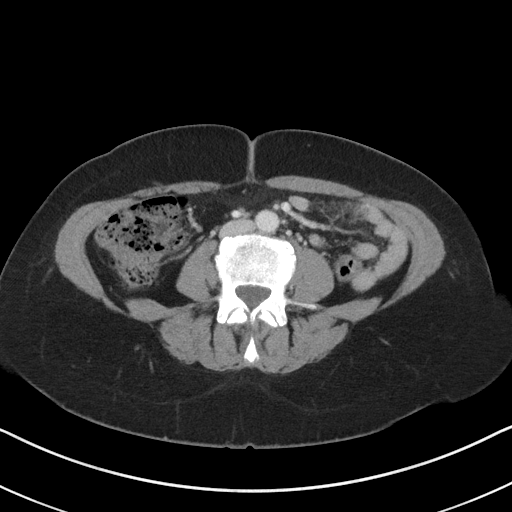
[im 59/93  soft-tissue]
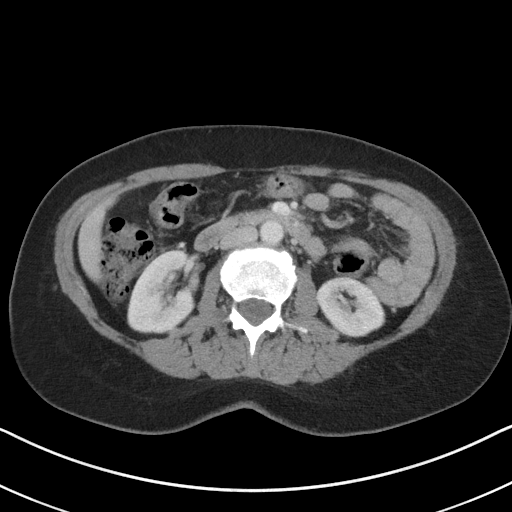
[im 63/93  soft-tissue]
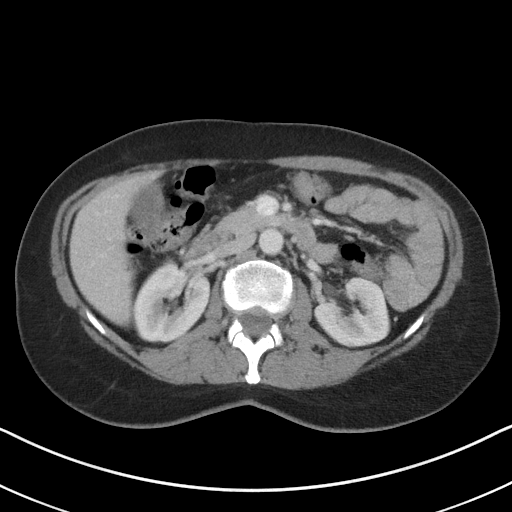
[im 63/93  bone]
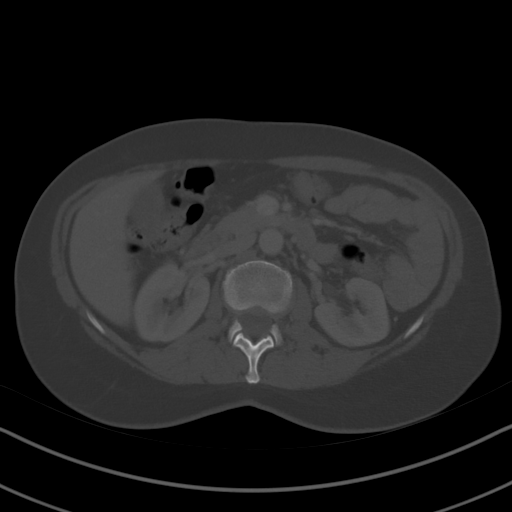
[im 73/93  soft-tissue]
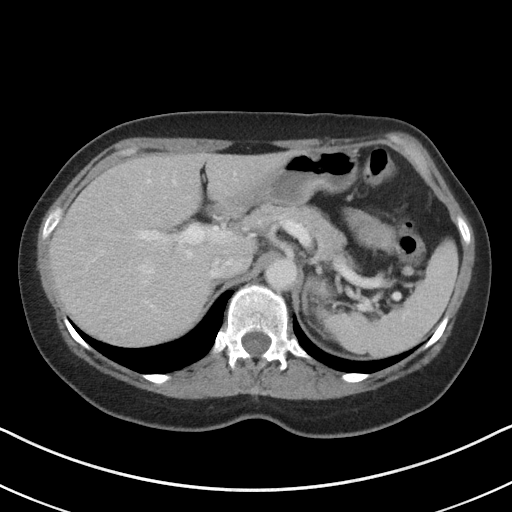
[im 78/93  soft-tissue]
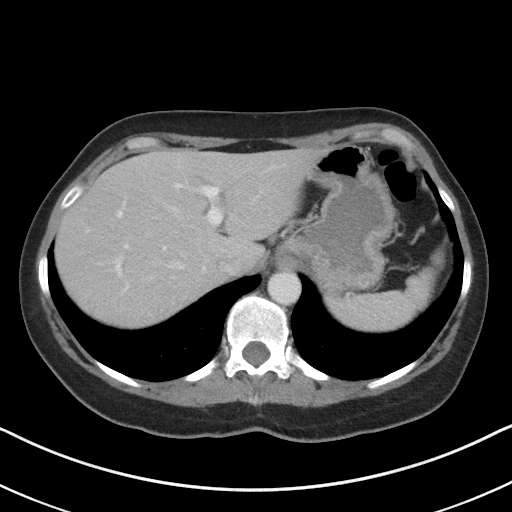
[im 88/93  soft-tissue]
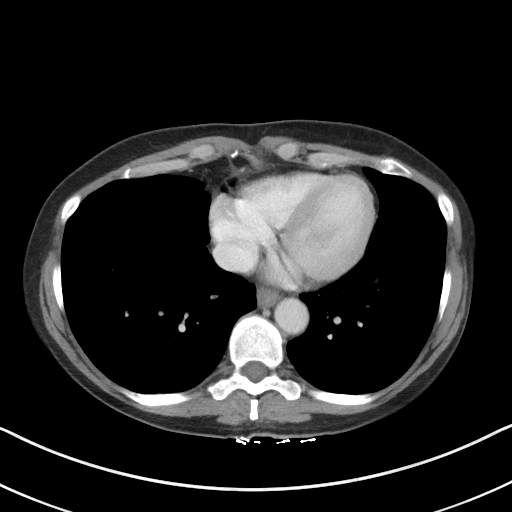

[Series 5: coronal st · coronal · 0.66mm/px · 3 of 75 slices shown]
[im 25/75  soft-tissue]
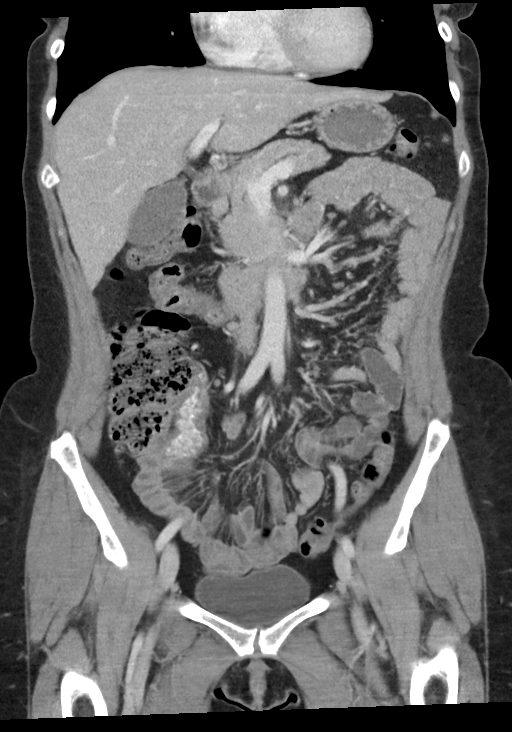
[im 33/75  soft-tissue]
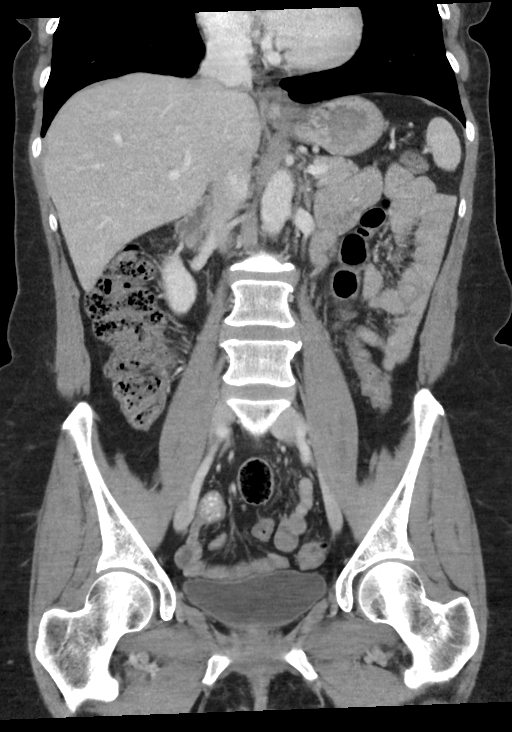
[im 42/75  soft-tissue]
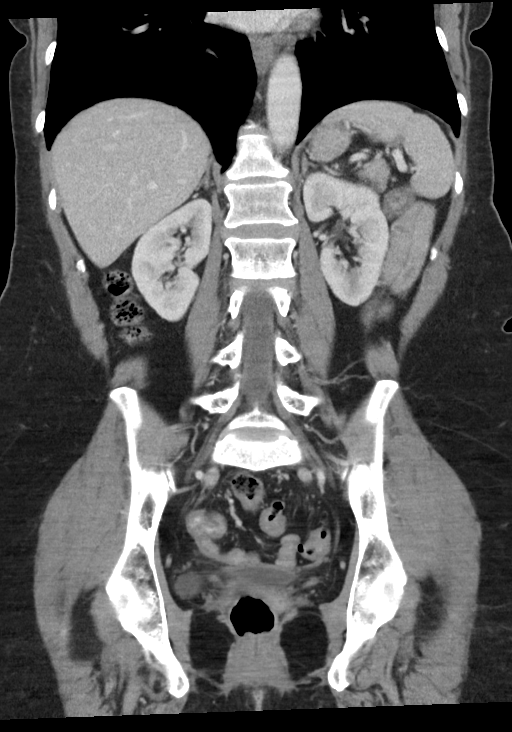

[15 of 46 positions shown; findings below may reference images not displayed]

FINDINGS: Lower chest: No acute abnormality.

Hepatobiliary: Low-attenuation structure in right lobe of liver is
too small to characterize measuring 5 mm. Gallstone measures 9 mm.
No gallbladder wall thickening or pericholecystic fluid. No
intrahepatic bile duct dilatation. Mild increased caliber of the CBD
measures 8 mm in maximum dimension. No choledocholithiasis.

Pancreas: Unremarkable. No pancreatic ductal dilatation or
surrounding inflammatory changes.

Spleen: Normal in size without focal abnormality.

Adrenals/Urinary Tract: Normal appearance of the adrenal glands.
Bilateral nephrolithiasis. The largest kidney stone is in the upper
pole of left kidney measuring 7 mm. No hydronephrosis identified
bilaterally. No hydroureter. The urinary bladder appears normal.

Stomach/Bowel: Stomach normal. The small bowel loops have a normal
course and caliber without evidence for bowel obstruction. The
appendix is visualized and appears normal. Distal colonic
diverticula. No acute inflammation.

Vascular/Lymphatic: No significant vascular findings are present. No
enlarged abdominal or pelvic lymph nodes.

Reproductive: Status post hysterectomy. No adnexal masses.

Other: No abdominal wall hernia or abnormality. No abdominopelvic
ascites.

Musculoskeletal: No acute or significant osseous findings.
IMPRESSION: 1. No acute findings.
2. Bilateral nonobstructing renal calculi.
3. Gallstone.

## 2020-11-15 ENCOUNTER — Ambulatory Visit (HOSPITAL_BASED_OUTPATIENT_CLINIC_OR_DEPARTMENT_OTHER): Payer: Medicare HMO | Admitting: Licensed Clinical Social Worker

## 2020-11-15 ENCOUNTER — Other Ambulatory Visit: Payer: Self-pay

## 2020-11-15 DIAGNOSIS — F411 Generalized anxiety disorder: Secondary | ICD-10-CM

## 2020-11-15 DIAGNOSIS — F3181 Bipolar II disorder: Secondary | ICD-10-CM | POA: Diagnosis not present

## 2020-11-15 NOTE — Plan of Care (Signed)
  Problem: Alleviate depressive/manic symptoms and return to improved levels of effective functioning. Goal: LTG: Stabilize mood and increase goal-directed behavior: Input needed on appropriate metric Outcome: Progressing Goal: STG: [name] will attend at least 80% of scheduled follow-up medication management appointments Outcome: Progressing   Problem: Alleviate depressive/manic symptoms and return to improved levels of effective functioning. Intervention: Work with [name] to identify the major components of a recent episode of depression: physical symptoms, major thoughts and images, and major behaviors they experienced Intervention: Encourage patient to identify triggers Intervention: Review PLEASE Skills (Treat Physical Illness, Balance Eating, Avoid Mood-Altering Substances, Balance Sleep and Get Exercise) with [name] Intervention: Encourage changes to improve social interaction Intervention: Assist with relaxation techniques, as appropriate (deep breathing exercises, meditation, guided imagery)

## 2020-11-15 NOTE — Progress Notes (Signed)
   THERAPIST PROGRESS NOTE  Session Time: 9-10a   Participation Level: Active  Behavioral Response: NeatAlertAnxious and Depressed  Type of Therapy: Individual Therapy  Treatment Goals addressed: Goal: LTG: Stabilize mood and increase goal-directed behavior: Input needed on appropriate metric Outcome: Progressing Goal: STG: [name] will attend at least 80% of scheduled follow-up medication management appointments Outcome: Progressing  Interventions: Intervention: Encourage patient to identify triggers Intervention: Review PLEASE Skills (Treat Physical Illness, Balance Eating, Avoid Mood-Altering Substances, Balance Sleep and Get Exercise) with patient Intervention: Encourage changes to improve social interaction Intervention: Assist with relaxation techniques, as appropriate (deep breathing exercises, meditation, guided imagery)  Summary: Stacy [name] is a 60 y.o. female who presents with continuing symptoms related to bipolar disorder diagnosis. Patient reports that she is experiencing more depression symptoms along with some anxiety symptoms. Patient reports overall mood is stable, and that she is trying hard to be more intentional about other ways of managing depression. Patient reports fair quality and quantity of sleep.  Allowed patients a space to explore and express thoughts and feelings associated with recent external stressors and life events. Explored patient's relationship with son, and husband. Patient reports that she feels that she is spending quality time with husband, and that she is happy that he is no longer drinking alcohol.  Patient reports that some immediate changes that she has tried are as follows: patient is working hard on daily structure, exercising more, setting boundaries and limits, bedtime routine. Praised patient on recent motivation to make positive changes and self help behaviors. I sure word patient that ongoing goals include maintaining current  levels of progress.  Continued recommendations are as follows: self care behaviors, positive social engagements, focusing on overall work/home/life balance, and focusing on positive physical and emotional wellness.  .   Suicidal/Homicidal: No  Therapist Response: Stacy [name] is being intentional about identifying and utilizing coping skills that are helpful in managing symptoms. Patient is being deliberate about increasing levels of social engagement and physical activity.These behaviors are reflective of both personal growth and progress. Treatment to continue as indicated.  Plan: Return again in 4 weeks.  Diagnosis: Axis I: Bipolar II, depressed; GAD    Axis II: No diagnosis    Stacy Bo Arlisha Patalano, LCSW 11/15/2020

## 2020-11-22 ENCOUNTER — Telehealth: Payer: Medicare HMO | Admitting: Psychiatry

## 2020-11-30 ENCOUNTER — Other Ambulatory Visit: Payer: Self-pay | Admitting: Family Medicine

## 2020-11-30 DIAGNOSIS — Z1231 Encounter for screening mammogram for malignant neoplasm of breast: Secondary | ICD-10-CM

## 2020-12-13 ENCOUNTER — Ambulatory Visit: Payer: Medicare HMO | Admitting: Psychology

## 2020-12-13 ENCOUNTER — Other Ambulatory Visit: Payer: Self-pay

## 2020-12-13 ENCOUNTER — Telehealth (INDEPENDENT_AMBULATORY_CARE_PROVIDER_SITE_OTHER): Payer: Medicare HMO | Admitting: Psychiatry

## 2020-12-13 ENCOUNTER — Encounter: Payer: Self-pay | Admitting: Psychiatry

## 2020-12-13 DIAGNOSIS — F411 Generalized anxiety disorder: Secondary | ICD-10-CM

## 2020-12-13 DIAGNOSIS — F3177 Bipolar disorder, in partial remission, most recent episode mixed: Secondary | ICD-10-CM

## 2020-12-13 DIAGNOSIS — F02A18 Dementia in other diseases classified elsewhere, mild, with other behavioral disturbance: Secondary | ICD-10-CM

## 2020-12-13 MED ORDER — PALIPERIDONE ER 1.5 MG PO TB24: 1.5000 mg | ORAL_TABLET | Freq: Every day | ORAL | 1 refills | Status: DC

## 2020-12-13 MED ORDER — BUSPIRONE HCL 10 MG PO TABS
20.0000 mg | ORAL_TABLET | Freq: Two times a day (BID) | ORAL | 1 refills | Status: DC
Start: 2020-12-13 — End: 2021-01-30

## 2020-12-13 NOTE — Progress Notes (Signed)
Virtual Visit via Video Note  I connected with Stacy [name] on 12/13/20 at 10:30 AM EDT by a video enabled telemedicine application and verified that I am speaking with the correct person using two identifiers.  Location Provider Location : [facility] Patient Location : Home  Participants: Patient , Provider    I discussed the limitations of evaluation and management by telemedicine and the availability of in person appointments. The patient expressed understanding and agreed to proceed.   I discussed the assessment and treatment plan with the patient. The patient was provided an opportunity to ask questions and all were answered. The patient agreed with the plan and demonstrated an understanding of the instructions.   The patient was advised to call back or seek an in-person evaluation if the symptoms worsen or if the condition fails to improve as anticipated.  [place] MD OP Progress Note  12/13/2020 11:12 AM Stacy [name]  MRN:  [id number]  Chief Complaint:  Chief Complaint   Follow-up; Depression; Anxiety    HPI: Stacy [name] is a 60 year old Caucasian female, unemployed, married, lives in [place], has a history of bipolar disorder, GAD, major neurocognitive disorder, Sjogren's syndrome, hypertension, hypothyroidism, arthritis, migraine headache was evaluated by telemedicine today.  Patient today reports she started taking the Invega a few weeks ago.  Since doing so her mood symptoms, crying spells have improved.  She reports she has been motivated to do more activities, has been going out with friends as well as spending time with her husband.  She however reports she continues to feel groggy until 11 AM or so in the morning, unknown if this is due to the [place].  She takes the Invega at 6 PM every evening.  She reports sleep has improved.  Does report paranoia on and off however the last time she felt that was 2 weeks ago when she was going to bed and she felt a presence near  her.  She was able to talk to herself and comfort herself and was able to go back to sleep.  Patient denies any suicidality or homicidality.  Patient continues to have memory problems, difficulty learning new things.  She however reports she has been more organized, has been keeping checklist, reminders as well as her husband has been very supportive which helps.  Patient denies any other concerns today.  Visit Diagnosis:    ICD-10-CM   1. Bipolar disorder, in partial remission, most recent episode mixed (HCC)  F31.77 Paliperidone 1.5 MG TB24   mixed,mild    2. GAD (generalized anxiety disorder)  F41.1 Paliperidone 1.5 MG TB24    busPIRone (BUSPAR) 10 MG tablet    3. 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  Past trials of Paxil, Latuda, Lamictal, Seroquel, Depakote, Belsomra, Zyprexa, Abilify, Vraylar.  Patient had neuropsychological testing done 10/11/2020-Dr. Rodenbough-overall subjective and objective findings not consistent with conditions like Alzheimer's dementia or Lewy body dementia.  Likely residual impact of significant underlying psychiatric issues including bipolar disorder, depression and insomnia.  Past Medical History:  Past Medical History:  Diagnosis Date   Aneurysm ([place])    Anxiety    Arthritis    Brain stem lesion    Chronic kidney disease    kideny stone   Depression    Family history of adverse reaction to anesthesia    mother has N/V   Fibromyalgia  GERD (gastroesophageal reflux disease)    Headache    Hypotension    Hypothyroid    Memory loss    Migraine    Raynaud disease    Sjogren's syndrome ([place])    Thyroid disease     Past Surgical History:  Procedure Laterality Date   ABDOMINAL HYSTERECTOMY     BASAL CELL CARCINOMA EXCISION     BREAST BIOPSY Left    CESAREAN SECTION     1982, 1984    CYSTOSCOPY WITH STENT PLACEMENT Bilateral 08/01/2020   Procedure: CYSTOSCOPY WITH STENT PLACEMENT;  Surgeon: Hollice Espy, MD;  Location: [facility] OR[facility];  Service: Urology;  Laterality: Bilateral;   CYSTOSCOPY/URETEROSCOPY/HOLMIUM LASER/STENT PLACEMENT Bilateral 08/14/2020   Procedure: CYSTOSCOPY/URETEROSCOPY/HOLMIUM LASER/STENT PLACEMENT;  Surgeon: Hollice Espy, MD;  Location: [facility];  Service: Urology;  Laterality: Bilateral;   EYE SURGERY Right    cataract   KNEE SURGERY Left    torn meniscus   LITHOTRIPSY     MUSCLE BIOPSY     OOPHORECTOMY      Family Psychiatric History: Reviewed family psychiatric history from progress note on 12/10/2017  Family History:  Family History  Problem Relation Age of Onset   Migraines Mother    Anxiety disorder Mother    Depression Mother    Cancer Father    Hypertension Father    Stroke Father    Heart attack Father    Alcohol abuse Father    Diabetes Brother    Alcohol abuse Sister    Drug abuse Sister    Anxiety disorder Sister    Depression Sister    Drug abuse Brother    Cancer Paternal Aunt    Lupus Paternal Aunt    Thyroid disease Paternal Aunt    Diabetes Maternal Grandfather    Anxiety disorder Maternal Grandmother    Depression Maternal Grandmother     Social History: Reviewed social history from progress note on 12/10/2017  Social History   Socioeconomic History   Marital status: Married    Spouse name: [name]   Number of children: 2   Years of education: 14   Highest education level: Associate degree: occupational, Hotel manager, or vocational program  Occupational History   Occupation: Disabled  Tobacco Use   Smoking status: Never   Smokeless tobacco: Never  Vaping Use   Vaping Use: Never used  Substance and Sexual Activity   Alcohol use: No    Alcohol/week: 0.0 standard drinks   Drug use: Never   Sexual activity: Yes    Birth control/protection: Surgical  Other Topics Concern   Not on file  Social History  Narrative   Lives at home with husband. [name]).    Disabled.   Education college.   Right handed.   2 cups caffeine/daily   Social Determinants of Health   Financial Resource Strain: Not on file  Food Insecurity: Not on file  Transportation Needs: Not on file  Physical Activity: Not on file  Stress: Not on file  Social Connections: Not on file    Allergies:  Allergies  Allergen Reactions   Penicillins Hives   Amoxicillin Hives   Depakote [Divalproex Sodium]     vomiting   Lamictal [Lamotrigine]     Side effects of abdominal cramps, nausea , diarrhea ,nightmares    Metabolic Disorder Labs: Lab Results  Component Value Date   HGBA1C 5.0 11/13/2015   No results found for: PROLACTIN No results found for: CHOL, TRIG, HDL, CHOLHDL, VLDL, LDLCALC Lab Results  Component Value  Date   TSH 1.780 10/26/2015   TSH 0.525 03/15/2015    Therapeutic Level Labs: No results found for: LITHIUM Lab Results  Component Value Date   VALPROATE 10 (L) 07/28/2019   No components found for:  CBMZ  Current Medications: Current Outpatient Medications  Medication Sig Dispense Refill   Ascorbic Acid (VITAMIN C) 100 MG tablet Take 100 mg by mouth daily.     busPIRone (BUSPAR) 10 MG tablet Take 2 tablets (20 mg total) by mouth 2 (two) times daily. 120 tablet 1   Cholecalciferol (VITAMIN D-3) 1000 units CAPS Take 1,000 Units by mouth daily.     clonazePAM (KLONOPIN) 0.5 MG tablet Take 1 tablet (0.5 mg total) by mouth daily as needed for anxiety. AS NEEDED FOR SEVERE ANXIETY ATTACKS 15 tablet 2   donepezil (ARICEPT) 10 MG tablet Take 10 mg by mouth every morning.     gabapentin (NEURONTIN) 300 MG capsule Take 1 capsule by mouth two times daily 180 capsule 2   HYDROcodone-acetaminophen (NORCO/VICODIN) 5-325 MG tablet Take 1-2 tablets by mouth every 6 (six) hours as needed for moderate pain. 10 tablet 0   hydroxychloroquine (PLAQUENIL) 200 MG tablet Take 200 mg by mouth 2 (two) times daily.      levothyroxine (SYNTHROID, LEVOTHROID) 175 MCG tablet Take 1 tablet (175 mcg total) by mouth daily before breakfast. 90 tablet 3   Multiple Vitamins-Minerals (PRESERVISION AREDS) CAPS Take 1 capsule by mouth daily.     omeprazole (PRILOSEC) 40 MG capsule Take 40 mg by mouth daily.     Paliperidone 1.5 MG TB24 Take 1 tablet (1.5 mg total) by mouth daily. 30 tablet 1   polyethylene glycol (MIRALAX / GLYCOLAX) 17 g packet Take 17 g by mouth daily. 14 each 0   SUMAtriptan (IMITREX) 100 MG tablet Take 100 mg by mouth every 2 (two) hours as needed for migraine.     vitamin E 45 MG (100 UNITS) capsule Take 100 Units by mouth daily.     No current facility-administered medications for this visit.     Musculoskeletal: Strength & Muscle Tone:  WNL Gait & Station:  Seated Patient leans: N/A  Psychiatric Specialty Exam: Review of Systems  Psychiatric/Behavioral:  Positive for dysphoric mood (improving). Negative for self-injury and suicidal ideas. The patient is not nervous/anxious.   All other systems reviewed and are negative.  There were no vitals taken for this visit.There is no height or weight on file to calculate BMI.  General Appearance: Casual  Eye Contact:  Fair  Speech:  Clear and Coherent  Volume:  Normal  Mood:   mood swings - better  Affect:  Congruent  Thought Process:  Goal Directed and Descriptions of Associations: Intact  Orientation:  Full (Time, Place, and Person)  Thought Content: Logical   Suicidal Thoughts:  No  Homicidal Thoughts:  No  Memory:  Immediate;   Fair Recent;   Fair Remote;   Fair reports short term memory loss  Judgement:  Fair  Insight:  Fair  Psychomotor Activity:  Normal  Concentration:  Concentration: Fair and Attention Span: Fair  Recall:  [employer] of Knowledge: Fair  Language: Fair  Akathisia:  No  Handed:  Right  AIMS (if indicated): done  Assets:  Communication Skills Desire for Improvement Housing Intimacy Social Support  ADL's:   Intact  Cognition: Baseline  Sleep:  Fair   Screenings: Administrator, Civil Service Office Visit from 06/15/2018 in [place] Office Visit  from 05/20/2018 in [place] Total Score 5 9      GAD-7    Flowsheet Row Video Visit from 09/21/2020 in [place] Video Visit from 07/26/2020 in [facility] from 11/11/2019 in [facility]  Total GAD-7 Score 12 8 18       [place] Office Visit from 01/01/2016 in [facility] Office Visit from 11/13/2015 in [facility] Office Visit from 10/26/2015 in [facility] Office Visit from 04/25/2015 in [facility] Office Visit from 10/25/2014 in [facility]  Total Score (max 30 points ) 25 26 21 23 29       PHQ2-9    Flowsheet Row Video Visit from 09/21/2020 in [place] Video Visit from 07/26/2020 in [facility] [facility] from 02/17/2020 in [facility] Counselor from 11/11/2019 in [place] Office Visit from 02/08/2015 in [facility]  PHQ-2 Total Score 2 1 6 6  0  PHQ-9 Total Score 12 -- 18 21 --      Flowsheet Row Video Visit from 09/21/2020 in [place] [facility] to Hosp-Admission (Discharged) from 07/31/2020 in [facility] [place] (1C) Video Visit from 07/26/2020 in [facility] No Risk No Risk No Risk        Assessment and Plan: Sincerity Cedar is a 60 year old Caucasian female who has a history of bipolar disorder type II, hypothyroidism, cognitive disorder, insomnia, Sjogren's syndrome, fibromyalgia was evaluated by telemedicine today.  Patient is currently improving on  the Invega however does continue to have side effects of grogginess.  She will benefit from the following plan.  Plan Bipolar disorder mixed-in partial remission Invega extended release 1.5 mg p.o. daily Continue gabapentin as prescribed per neurology.  Also discussed with patient to take gabapentin a.m. dose as well as any medications that could potentially make her drowsy later on during the day and not in the morning.  GAD-improving BuSpar 20 mg p.o. twice daily Klonopin 0.5 mg as needed for severe anxiety attacks only.  Patient has been limiting use. Hydroxyzine 25 mg p.o. daily as needed for severe anxiety attacks  Mild neurocognitive disorder with behavioral disturbances-improving Patient has been keeping checklist, reminders as well as has good support system from her spouse. Aricept as prescribed by neurology.  Follow-up in clinic in 6 to 7 weeks or sooner in person.  This note was generated in part or whole with voice recognition software. Voice recognition is usually quite accurate but there are transcription errors that can and very often do occur. I apologize for any typographical errors that were not detected and corrected.      Ursula Alert, MD 12/14/2020, 8:16 AM

## 2020-12-19 ENCOUNTER — Ambulatory Visit: Payer: Medicare HMO | Admitting: Psychology

## 2020-12-20 ENCOUNTER — Ambulatory Visit: Payer: Self-pay | Admitting: Licensed Clinical Social Worker

## 2020-12-20 DIAGNOSIS — Z91199 Patient's noncompliance with other medical treatment and regimen due to unspecified reason: Secondary | ICD-10-CM

## 2020-12-20 NOTE — Progress Notes (Signed)
LCSW counselor tried to connect with patient for scheduled in person appointment scheduled at 10am. Pt did not arrive in the office for this appointment.   Called pt at 10:15 to inquire about appointment with no answer and LCSW counselor could not leave message due to mailbox being full.

## 2021-01-03 ENCOUNTER — Ambulatory Visit: Payer: Medicare HMO | Admitting: Psychology

## 2021-01-16 ENCOUNTER — Inpatient Hospital Stay: Admission: RE | Admit: 2021-01-16 | Payer: Medicare HMO | Source: Ambulatory Visit

## 2021-01-30 ENCOUNTER — Other Ambulatory Visit: Payer: Self-pay

## 2021-01-30 ENCOUNTER — Ambulatory Visit (INDEPENDENT_AMBULATORY_CARE_PROVIDER_SITE_OTHER): Payer: Medicare HMO | Admitting: Psychiatry

## 2021-01-30 ENCOUNTER — Encounter: Payer: Self-pay | Admitting: Psychiatry

## 2021-01-30 VITALS — BP 118/80 | HR 83 | Temp 98.7°F | Wt 165.8 lb

## 2021-01-30 DIAGNOSIS — F411 Generalized anxiety disorder: Secondary | ICD-10-CM | POA: Diagnosis not present

## 2021-01-30 DIAGNOSIS — F02A18 Dementia in other diseases classified elsewhere, mild, with other behavioral disturbance: Secondary | ICD-10-CM

## 2021-01-30 DIAGNOSIS — Z79899 Other long term (current) drug therapy: Secondary | ICD-10-CM

## 2021-01-30 DIAGNOSIS — F3177 Bipolar disorder, in partial remission, most recent episode mixed: Secondary | ICD-10-CM

## 2021-01-30 MED ORDER — GABAPENTIN 300 MG PO CAPS: 300.0000 mg | ORAL_CAPSULE | ORAL | 1 refills | Status: DC

## 2021-01-30 MED ORDER — BUSPIRONE HCL 10 MG PO TABS: 20.0000 mg | ORAL_TABLET | Freq: Two times a day (BID) | ORAL | 1 refills | Status: DC

## 2021-01-30 MED ORDER — BREXPIPRAZOLE 0.25 MG PO TABS: 0.2500 mg | ORAL_TABLET | Freq: Every day | ORAL | 1 refills | Status: DC

## 2021-01-30 NOTE — Progress Notes (Signed)
[facility] MD OP Progress Note  01/30/2021 2:46 PM Stacy [name]  MRN:  [id number]  Chief Complaint:  Chief Complaint   Follow-up; Depression; Hallucinations    HPI: Stacy [name] is a 60 year old Caucasian female, unemployed, married, lives in [place], has a history of bipolar disorder, GAD, major neurocognitive disorder, Sjogren's syndrome, hypertension, hypothyroidism, arthritis, migraine headache was evaluated in office today.  Patient reports she did not tolerate the Invega, it made her drowsy and hence stopped taking it.  She continues to have racing thoughts especially at night which keeps her awake.  Patient also reports hallucinations, seeing shadows.  She reports there are times when she is able to cope with it and other times when she is scared and it takes her a while to calm down.  Patient reports she continues to have anxiety, restlessness although the gabapentin and BuSpar are beneficial.  Denies side effects.  Patient denies any suicidality, homicidality or perceptual disturbances.  Patient with memory problems, was able to answer questions appropriately.  Does have social support system from her husband who helps her out at home.  Patient does have upcoming appointment with therapist and is motivated to keep it.  Patient denies any other concerns today.  Visit Diagnosis:    ICD-10-CM   1. Bipolar disorder, in partial remission, most recent episode mixed (HCC)  F31.77 gabapentin (NEURONTIN) 300 MG capsule    brexpiprazole (REXULTI) 0.25 MG TABS tablet    2. GAD (generalized anxiety disorder)  F41.1 gabapentin (NEURONTIN) 300 MG capsule    busPIRone (BUSPAR) 10 MG tablet    3. 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 High risk medication use  Z79.899       Past Psychiatric History: Reviewed past psychiatric history from progress note on 12/10/2017.  Past trials of Paxil, Latuda, Lamictal, Seroquel,  Depakote, Belsomra, Zyprexa, Abilify, Vraylar.  Patient had neuropsychological testing done 10/11/2020-Dr. Rodenbough-overall subjective and objective findings not consistent with conditions like Alzheimer's dementia or Lewy body dementia.  Likely residual impact of significant underlying psychiatric issues including bipolar disorder, depression and insomnia.  Past Medical History:  Past Medical History:  Diagnosis Date   Aneurysm ([place])    Anxiety    Arthritis    Brain stem lesion    Chronic kidney disease    kideny stone   Depression    Family history of adverse reaction to anesthesia    mother has N/V   Fibromyalgia    GERD (gastroesophageal reflux disease)    Headache    Hypotension    Hypothyroid    Memory loss    Migraine    Raynaud disease    Sjogren's syndrome ([place])    Thyroid disease     Past Surgical History:  Procedure Laterality Date   ABDOMINAL HYSTERECTOMY     BASAL CELL CARCINOMA EXCISION     BREAST BIOPSY Left    CESAREAN SECTION     1982, 1984   CYSTOSCOPY WITH STENT PLACEMENT Bilateral 08/01/2020   Procedure: CYSTOSCOPY WITH STENT PLACEMENT;  Surgeon: Hollice Espy, MD;  Location: [facility];  Service: Urology;  Laterality: Bilateral;   CYSTOSCOPY/URETEROSCOPY/HOLMIUM LASER/STENT PLACEMENT Bilateral 08/14/2020   Procedure: CYSTOSCOPY/URETEROSCOPY/HOLMIUM LASER/STENT PLACEMENT;  Surgeon: Hollice Espy, MD;  Location: [facility];  Service: Urology;  Laterality: Bilateral;   EYE SURGERY Right    cataract   KNEE SURGERY Left    torn meniscus   LITHOTRIPSY     MUSCLE BIOPSY  OOPHORECTOMY      Family Psychiatric History: Reviewed family psychiatric history from progress note on 12/10/2017  Family History:  Family History  Problem Relation Age of Onset   Migraines Mother    Anxiety disorder Mother    Depression Mother    Cancer Father    Hypertension Father    Stroke Father    Heart attack Father    Alcohol abuse Father    Diabetes Brother     Alcohol abuse Sister    Drug abuse Sister    Anxiety disorder Sister    Depression Sister    Drug abuse Brother    Cancer Paternal Aunt    Lupus Paternal Aunt    Thyroid disease Paternal Aunt    Diabetes Maternal Grandfather    Anxiety disorder Maternal Grandmother    Depression Maternal Grandmother     Social History: Reviewed social history from progress note on 12/10/2017 Social History   Socioeconomic History   Marital status: Married    Spouse name: Stacy [name]   Number of children: 2   Years of education: 14   Highest education level: Associate degree: occupational, Hotel manager, or vocational program  Occupational History   Occupation: Disabled  Tobacco Use   Smoking status: Never   Smokeless tobacco: Never  Vaping Use   Vaping Use: Never used  Substance and Sexual Activity   Alcohol use: No    Alcohol/week: 0.0 standard drinks   Drug use: Never   Sexual activity: Yes    Birth control/protection: Surgical  Other Topics Concern   Not on file  Social History Narrative   Lives at home with husband. Stacy [name]).    Disabled.   Education college.   Right handed.   2 cups caffeine/daily   Social Determinants of Health   Financial Resource Strain: Not on file  Food Insecurity: Not on file  Transportation Needs: Not on file  Physical Activity: Not on file  Stress: Not on file  Social Connections: Not on file    Allergies:  Allergies  Allergen Reactions   Penicillins Hives   Amoxicillin Hives   Depakote [Divalproex Sodium]     vomiting   Lamictal [Lamotrigine]     Side effects of abdominal cramps, nausea , diarrhea ,nightmares    Metabolic Disorder Labs: Lab Results  Component Value Date   HGBA1C 5.0 11/13/2015   No results found for: PROLACTIN No results found for: CHOL, TRIG, HDL, CHOLHDL, VLDL, LDLCALC Lab Results  Component Value Date   TSH 1.780 10/26/2015   TSH 0.525 03/15/2015    Therapeutic Level Labs: No results found for: LITHIUM Lab  Results  Component Value Date   VALPROATE 10 (L) 07/28/2019   No components found for:  CBMZ  Current Medications: Current Outpatient Medications  Medication Sig Dispense Refill   Ascorbic Acid (VITAMIN C) 100 MG tablet Take 100 mg by mouth daily.     brexpiprazole (REXULTI) 0.25 MG TABS tablet Take 1 tablet (0.25 mg total) by mouth at bedtime. 30 tablet 1   Cholecalciferol (VITAMIN D-3) 1000 units CAPS Take 1,000 Units by mouth daily.     clonazePAM (KLONOPIN) 0.5 MG tablet Take 1 tablet (0.5 mg total) by mouth daily as needed for anxiety. AS NEEDED FOR SEVERE ANXIETY ATTACKS 15 tablet 2   donepezil (ARICEPT) 10 MG tablet Take 10 mg by mouth every morning.     HYDROcodone-acetaminophen (NORCO/VICODIN) 5-325 MG tablet Take 1-2 tablets by mouth every 6 (six) hours as needed for moderate pain.  10 tablet 0   hydroxychloroquine (PLAQUENIL) 200 MG tablet Take 200 mg by mouth 2 (two) times daily.     levothyroxine (SYNTHROID, LEVOTHROID) 175 MCG tablet Take 1 tablet (175 mcg total) by mouth daily before breakfast. 90 tablet 3   Multiple Vitamins-Minerals (PRESERVISION AREDS) CAPS Take 1 capsule by mouth daily.     omeprazole (PRILOSEC) 40 MG capsule Take 40 mg by mouth daily.     polyethylene glycol (MIRALAX / GLYCOLAX) 17 g packet Take 17 g by mouth daily. 14 each 0   SUMAtriptan (IMITREX) 100 MG tablet Take 100 mg by mouth every 2 (two) hours as needed for migraine.     vitamin E 45 MG (100 UNITS) capsule Take 100 Units by mouth daily.     alendronate (FOSAMAX) 70 MG tablet 1 tablet 30 minutes before the first food, beverage or medicine of the day dissolved in 4 ounces of water     busPIRone (BUSPAR) 10 MG tablet Take 2 tablets (20 mg total) by mouth 2 (two) times daily. 120 tablet 1   gabapentin (NEURONTIN) 300 MG capsule Take 1-2 capsules (300-600 mg total) by mouth as directed. Take 1 capsule daily AM and 2 capsules daily PM 270 capsule 1   No current facility-administered medications for  this visit.     Musculoskeletal: Strength & Muscle Tone: within normal limits Gait & Station: normal Patient leans: N/A  Psychiatric Specialty Exam: Review of Systems  Psychiatric/Behavioral:  Positive for hallucinations and sleep disturbance. The patient is nervous/anxious.   All other systems reviewed and are negative.  Blood pressure 118/80, pulse 83, temperature 98.7 F (37.1 C), temperature source Temporal, weight 165 lb 12.8 oz (75.2 kg).Body mass index is 25.97 kg/m.  General Appearance: Casual  Eye Contact:  Fair  Speech:  Normal Rate  Volume:  Normal  Mood:  Anxious  Affect:  Congruent  Thought Process:  Goal Directed and Descriptions of Associations: Intact  Orientation:  Full (Time, Place, and Person)  Thought Content: Hallucinations: Visual shadows  Suicidal Thoughts:  No  Homicidal Thoughts:  No  Memory:  Immediate;   Fair Recent;   Fair Remote;   limited  Judgement:  Fair  Insight:  Fair  Psychomotor Activity:  Restlessness  Concentration:  Concentration: Good and Attention Span: Fair  Recall:  [employer] of Knowledge: Fair  Language: Fair  Akathisia:  No  Handed:  Right  AIMS (if indicated): done, 0  Assets:  Communication Skills Desire for Improvement Housing Social Support  ADL's:  Intact  Cognition: WNL  Sleep:  Poor   Screenings: [facility] Office Visit from 06/15/2018 in [facility] Office Visit from 05/20/2018 in [facility] Total Score 5 9      GAD-7    Flowsheet Row Video Visit from 09/21/2020 in [facility] Video Visit from 07/26/2020 in [facility] from 11/11/2019 in [facility]  Total GAD-7 Score 12 8 18       [place] Visit from 01/01/2016 in [facility] Office Visit from 11/13/2015 in [facility]  Office Visit from 10/26/2015 in [facility] Office Visit from 04/25/2015 in [facility] Office Visit from 10/25/2014 in [facility]  Total Score (max 30 points ) 25 26 21 23 29       PHQ2-9    [place] Visit from 01/30/2021 in [facility] Video Visit from 09/21/2020 in [facility] Video Visit from 07/26/2020 in [facility] from 02/17/2020 in [facility] Counselor from 11/11/2019 in [facility]  PHQ-2 Total Score 2 2 1 6 6   PHQ-9 Total Score 13 12 -- 18 21      [place] Office Visit from 01/30/2021 in [facility] Video Visit from 09/21/2020 in [facility] to Hosp-Admission (Discharged) from 07/31/2020 in [facility] (1C)  C-SSRS RISK CATEGORY No Risk No Risk No Risk        Assessment and Plan: Chelcea Zahn is a 59 year old Caucasian female who has a history of bipolar disorder type II, hypothyroidism, cognitive disorder, insomnia, Sjogren's syndrome, fibromyalgia was evaluated in office today.  Patient is currently struggling with racing thoughts, anxiety and will benefit from the following plan.  Plan Bipolar disorder mixed in partial remission Discontinue Invega for side effects and noncompliance Start Rexulti 0.25 mg p.o. nightly. Increase gabapentin to 300 mg p.o. daily in the morning and 600 mg every afternoon.  Initially prescribed by neurology.  GAD-improving BuSpar 20 mg p.o. twice daily Gabapentin dosage has been increased as noted above Klonopin 0.5 mg as needed for severe anxiety attacks, patient has been limiting use. Hydroxyzine 25 mg p.o. daily as needed for severe anxiety attacks  Mild neurocognitive disorder with behavioral disturbance-improving Continue Aricept as  prescribed by neurology. Patient to continue to make checklist, reminders.  High risk medication use-patient will benefit from lipid panel, hemoglobin A1c, prolactin level, TSH.  Patient reports she had her labs done recently, advised to sign an ROI to request medical records from primary care provider.  Follow-up in clinic in 3 weeks or sooner if needed.  This note was generated in part or whole with voice recognition software. Voice recognition is usually quite accurate but there are transcription errors that can and very often do occur. I apologize for any typographical errors that were not detected and corrected.      Ursula Alert, MD 01/31/2021, 8:26 AM

## 2021-01-30 NOTE — Patient Instructions (Signed)
Brexpiprazole Oral Tablets What is this medication? BREXPIPRAZOLE (brex PIP ray zole) treats schizophrenia. It works by adjusting levels of dopamine and serotonin in the brain, chemicals that help regulate mood and thoughts. It belongs to a group of medications called atypical antipsychotics. It may also be used in combination with antidepressants to treat major depressive disorder. This medicine may be used for other purposes; ask your health care provider or pharmacist if you have questions. COMMON BRAND NAME(S): REXULTI What should I tell my care team before I take this medication? They need to know if you have any of these conditions: dementia diabetes difficulty swallowing have trouble controlling your muscles have urges you are unable to control (for example, gambling, spending money, or eating) heart disease high cholesterol history of breast cancer history of stroke kidney disease liver disease low blood counts, like low white cell, platelet, or red cell counts low blood pressure Parkinson's disease seizures suicidal thoughts, plans or attempt; a previous suicide attempt by you or a family member an unusual or allergic reaction to brexpiprazole, other medicines, foods, dyes, or preservatives pregnant or trying to get pregnant breast-feeding How should I use this medication? Take this medication by mouth with water. Take it as directed on the prescription label at the same time every day. You can take it with or without food. If it upsets your stomach, take it with food. Keep taking this medication unless your care team tells you to stop. Stopping it too quickly can cause serious side effects. It can also make your condition worse. A special MedGuide will be given to you by the pharmacist with each prescription and refill. Be sure to read this information carefully each time. Talk to your care team about the use of this medication in children. While it may be prescribed for  children as young as 13 years for selected conditions, precautions do apply. Overdosage: If you think you have taken too much of this medicine contact a poison control center or emergency room at once. NOTE: This medicine is only for you. Do not share this medicine with others. What if I miss a dose? If you miss a dose, take it as soon as you can. If it is almost time for your next dose, take only that dose. Do not take double or extra doses. What may interact with this medication? Do not take this medicine with any of the following medications: aripiprazole metoclopramide This medicine may also interact with the following medications: antihistamines for allergy, cough, and cold certain medicines for anxiety or sleep certain medicines for depression like amitriptyline, duloxetine, fluoxetine, paroxetine, sertraline certain medicines for fungal infections like fluconazole, itraconazole, ketoconazole clarithromycin general anesthetics like halothane, isoflurane, methoxyflurane, propofol levodopa or other medicines for Parkinson's disease medicines for blood pressure medicines that relax muscles for surgery medicines for seizures narcotic medicines for pain phenothiazines like chlorpromazine, prochlorperazine, thioridazine quinidine rifampin St. John's Wort This list may not describe all possible interactions. Give your health care provider a list of all the medicines, herbs, non-prescription drugs, or dietary supplements you use. Also tell them if you smoke, drink alcohol, or use illegal drugs. Some items may interact with your medicine. What should I watch for while using this medication? Visit your care team for regular checks on your progress. Tell your care team if symptoms do not start to get better or if they get worse. Do not stop taking except on your care team's advice. You may develop a severe reaction. Your care team will  tell you how much medication to take. Patients and their  families should watch out for new or worsening depression or thoughts of suicide. Also watch out for sudden changes in feelings such as feeling anxious, agitated, panicky, irritable, hostile, aggressive, impulsive, severely restless, overly excited and hyperactive, or not being able to sleep. If this happens, especially at the beginning of antidepressant treatment or after a change in dose, call your care team. You may get dizzy or drowsy. Do not drive, use machinery, or do anything that needs mental alertness until you know how this medication affects you. Do not stand or sit up quickly, especially if you are an older patient. This reduces the risk of dizzy or fainting spells. Alcohol may interfere with the effect of this medication. Avoid alcoholic drinks. There are have been reports of increased sexual urges or other strong urges such as gambling while taking this medication. If you experience any of these while taking this medication, you should report this to your care team as soon as possible. This medication may cause dry eyes and blurred vision. If you wear contact lenses you may feel some discomfort. Lubricating drops may help. See your eye doctor if the problem does not go away or is severe. This medication may increase blood sugar. Ask your care team if changes in diet or medications are needed if you have diabetes. This medication can cause problems with controlling your body temperature. It can lower the response of your body to cold temperatures. If possible, stay indoors during cold weather. If you must go outdoors, wear warm clothes. It can also lower the response of your body to heat. Do not overheat. Do not over-exercise. Stay out of the sun when possible. If you must be in the sun, wear cool clothing. Drink plenty of water. If you have trouble controlling your body temperature, call your care team right away. What side effects may I notice from receiving this medication? Side effects that  you should report to your doctor or health care professional as soon as possible: Alergic reactions--skin rash, itching, hives, swelling of the face, lips, tongue, or throat Confusion Elevated mood, decreased need for sleep, racing thoughts, impulsive behavior Fever, chills, sore throat Inability to keep still New or increased gambling urges, sexual urges, uncontrolled spending, binge or compulsive eating, or other urges Problems with balance, talking, walking Seizures High blood sugar--increased thirst or amount of urine, unusual weakness or fatigue, blurry vision Low blood pressure--dizziness, feeling faint or lightheaded, blurry vision Neuroleptic malignant syndrome--confusion, fast or irregular heartbeat, high fever, increased sweating, stiff muscles Sudden numbness or weakness of the face, arm, or leg Thoughts of suicide or self-harm, worsening mood, feelings of depression Trouble breathing Trouble swallowing Uncontrollable movements of the arms, face, head, mouth, neck, or upper body Side effects that usually do not require medical attention (report to your doctor or health care professional if they continue or are bothersome): Constipation Drowsiness Headache Restlessness Weight gain This list may not describe all possible side effects. Call your doctor for medical advice about side effects. You may report side effects to FDA at 1-800-FDA-1088. Where should I keep my medication? Keep out of the reach of children and pets. Store at room temperature between 20 and 25 degrees C (68 and 77 degrees F). Get rid of any unused medication after the expiration date. To get rid of medications that are no longer needed or have expired: Take the medication to a medication take-back program. Check with your pharmacy or  law enforcement to find a location. If you cannot return the medication, check the label or package insert to see if the medication should be thrown out in the garbage or flushed  down the toilet. If you are not sure, ask your care team. If it is safe to put it in the trash, take the medication out of the container. Mix the medication with cat litter, dirt, coffee grounds, or other unwanted substance. Seal the mixture in a bag or container. Put it in the trash. NOTE: This sheet is a summary. It may not cover all possible information. If you have questions about this medicine, talk to your doctor, pharmacist, or health care provider.  2022 Elsevier/Gold Standard (2020-10-31 00:00:00)

## 2021-01-31 ENCOUNTER — Ambulatory Visit (INDEPENDENT_AMBULATORY_CARE_PROVIDER_SITE_OTHER): Payer: Medicare HMO | Admitting: Licensed Clinical Social Worker

## 2021-01-31 DIAGNOSIS — F3177 Bipolar disorder, in partial remission, most recent episode mixed: Secondary | ICD-10-CM

## 2021-01-31 DIAGNOSIS — F411 Generalized anxiety disorder: Secondary | ICD-10-CM | POA: Diagnosis not present

## 2021-01-31 NOTE — Plan of Care (Signed)
  Problem: Alleviate depressive/manic symptoms and return to improved levels of effective functioning. Goal: LTG: Stabilize mood and increase goal-directed behavior: Input needed on appropriate metric Outcome: Not Progressing Goal: STG: [name] will attend at least 80% of scheduled follow-up medication management appointments Outcome: Progressing Intervention: Assess emotional status and coping mechanisms Intervention: Help pt identify alternative behaviors to ManageTriggers Intervention: Assist patient to identify own strengths and abilities Intervention: Identify effective coping behavior

## 2021-01-31 NOTE — Progress Notes (Signed)
   THERAPIST PROGRESS NOTE  Session Time: [id number]  Participation Level: Active  Behavioral Response: NeatAlertAnxious and Depressed  Type of Therapy: Individual Therapy  Treatment Goals addressed:  Problem: Alleviate depressive/manic symptoms and return to improved levels of effective functioning. Goal: LTG: Stabilize mood and increase goal-directed behavior: Input needed on appropriate metric Outcome: Not Progressing  Goal: STG: [name] will attend at least 80% of scheduled follow-up medication management appointments Outcome: Progressing  Interventions: Intervention: Assess emotional status and coping mechanisms  Intervention: Help pt identify alternative behaviors to ManageTriggers  Intervention: Assist patient to identify own strengths and abilities  Intervention: Identify effective coping behavior  Summary: Stacy [name] is a 60 y.o. female who presents with continuing symptoms related to bipolar disorder diagnosis. Patient reports that she is experiencing more depression symptoms along with some anxiety symptoms. Patient reports overall mood is stable, and that she is trying hard to be more intentional about other ways of managing depression. Patient reports fair quality and quantity of sleep.  Allowed patients a space to explore and express thoughts and feelings associated with recent external stressors and life events. Explored patient's relationship with son, and husband.   Patient reports that some immediate changes that she has tried are as follows: patient is working hard on daily structure, exercising more, setting boundaries and limits, bedtime routine. Praised patient on recent motivation to make positive changes and self help behaviors.  Pt reports that her husband is continuing to drink alcohol--just switched from beer to vodka. "He is really mean when he drinks".  Pt reports that her husband is very controlling and wants to know what she is doing at all  times during the day and frequently wants her by his side all day long if he is home working on any projects. Discussed pts overall emotional well being and the recent changes that pt has made to continue these behaviors.   Continued recommendations are as follows: self care behaviors, positive social engagements, focusing on overall work/home/life balance, and focusing on positive physical and emotional wellness.  .   Suicidal/Homicidal: No  Therapist Response: [name] is being intentional about identifying and utilizing coping skills that are helpful in managing symptoms. Patient is being deliberate about increasing levels of social engagement and physical activity.These behaviors are reflective of both personal growth and progress. Treatment to continue as indicated.  Plan: Return again in 4 weeks.  Diagnosis: Axis I: Bipolar II, depressed; GAD    Axis II: No diagnosis    Rachel Bo Bradee Common, LCSW 01/31/2021

## 2021-02-01 ENCOUNTER — Telehealth: Payer: Self-pay

## 2021-02-01 NOTE — Telephone Encounter (Signed)
went online and submitted the prior auth to [email or web address]-  pending

## 2021-02-01 NOTE — Telephone Encounter (Signed)
This drug was chosen since she tried and failed everything else available or had side effects

## 2021-02-01 NOTE — Telephone Encounter (Signed)
received fax requesting a prior auth on the rexulti

## 2021-02-01 NOTE — Telephone Encounter (Signed)
Rexulti was denied.   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 This is an off-label use that is not medically accepted. The Medicare rule in the Prescription Drug Benefit Manual (Chapter 6, Section 10.6) says a drug must be used for a medically accepted indication (covered use). Off-label use is medically accepted when there is proof in one or more of the drug guides that the drug works for your condition. We look at the two major drug guides (compendia): the Leary and the Loda (AHFS-DI). [employer] has decided that there is no proof in either drug guide that this drug works for your condition. Per Medicare rules, it is not covered.

## 2021-02-21 ENCOUNTER — Telehealth (INDEPENDENT_AMBULATORY_CARE_PROVIDER_SITE_OTHER): Payer: Medicare HMO | Admitting: Psychiatry

## 2021-02-21 ENCOUNTER — Other Ambulatory Visit: Payer: Self-pay

## 2021-02-21 ENCOUNTER — Encounter: Payer: Self-pay | Admitting: Psychiatry

## 2021-02-21 ENCOUNTER — Telehealth: Payer: Self-pay

## 2021-02-21 DIAGNOSIS — F02A18 Dementia in other diseases classified elsewhere, mild, with other behavioral disturbance: Secondary | ICD-10-CM

## 2021-02-21 DIAGNOSIS — F411 Generalized anxiety disorder: Secondary | ICD-10-CM | POA: Diagnosis not present

## 2021-02-21 DIAGNOSIS — F3181 Bipolar II disorder: Secondary | ICD-10-CM

## 2021-02-21 DIAGNOSIS — F5101 Primary insomnia: Secondary | ICD-10-CM | POA: Diagnosis not present

## 2021-02-21 MED ORDER — ESZOPICLONE 1 MG PO TABS
1.0000 mg | ORAL_TABLET | Freq: Every evening | ORAL | 1 refills | Status: DC | PRN
Start: 2021-02-21 — End: 2022-02-21

## 2021-02-21 NOTE — Telephone Encounter (Signed)
Medication management - Telephone call with patient, per verbal order by Dr. Shea Evans, to question when patient could pick up sample Rexulti medications.  Patient stated she would be able to pick these up on tomorrow, 02/22/21 in the morning.  Informed patient she would be getting a starter packet of Rexulti with 7 tablets of 0.5 mg, and 7 tablets for 1 mg and Dr. Shea Evans wanted her to start by taking 1/2 tablet of the 0.5 mg for 2 weeks and then to increase to 1/2 tablet of the 1 mg dosage there after.  Patient agreed with plan and will pick up sample medication 02/22/21 and take as directed by Dr.Eappen.

## 2021-02-21 NOTE — Telephone Encounter (Signed)
Thank you :)

## 2021-02-21 NOTE — Patient Instructions (Signed)
Eszopiclone Tablets What is this medication? ESZOPICLONE (es ZOE pi clone) treats insomnia. It helps you go to sleep faster and stay asleep through the night. It is often used for a short period of time. This medicine may be used for other purposes; ask your health care provider or pharmacist if you have questions. COMMON BRAND NAME(S): Lunesta What should I tell my care team before I take this medication? They need to know if you have any of these conditions: Depression Liver disease Lung or breathing disease, such as asthma or COPD Substance use disorder Sleep-walking, driving, eating or other activity while not fully awake after taking a sleep medication Suicidal thoughts, plans, or attempt by you or a family member An unusual or allergic reaction to eszopiclone, other medications, foods, dyes, or preservatives Pregnant or trying to get pregnant Breast-feeding How should I use this medication? Take this medication by mouth with a glass of water. Follow the directions on the prescription label. It is better to take this medication on an empty stomach and only when you are ready for bed. Do not take your medication more often than directed. If you have been taking this medication for several weeks and suddenly stop taking it, you may get unpleasant withdrawal symptoms. Your care team may want to gradually reduce the dose. Do not stop taking this medication on your own. Always follow your care team's advice. A special MedGuide will be given to you by the pharmacist with each prescription and refill. Be sure to read this information carefully each time. Talk to your care team about the use of this medication in children. Special care may be needed. Overdosage: If you think you have taken too much of this medicine contact a poison control center or emergency room at once. NOTE: This medicine is only for you. Do not share this medicine with others. What if I miss a dose? This does not apply. This  medication should only be taken immediately before going to sleep. Do not take double or extra doses. What may interact with this medication? Herbal medications like kava kava, melatonin, St. John's Wort and valerian Lorazepam Medications for fungal infections like ketoconazole, fluconazole, or itraconazole Olanzapine This list may not describe all possible interactions. Give your health care provider a list of all the medicines, herbs, non-prescription drugs, or dietary supplements you use. Also tell them if you smoke, drink alcohol, or use illegal drugs. Some items may interact with your medicine. What should I watch for while using this medication? Visit your care team for regular checks on your progress. Keep a regular sleep schedule by going to bed at about the same time nightly. Avoid caffeine-containing drinks in the evening hours, as caffeine can cause trouble with falling asleep. Talk to your care team if you still have trouble sleeping. You may do unusual sleep behaviors or activities you do not remember the day after taking this medication. Activities include driving, making or eating food, talking on the phone, sexual activity, or sleep walking. Stop taking this medication and call your care team right away if you find out you have done activities like this. Plan to go to bed and stay in bed for a full night (7 to 8 hours) after you take this medication. You may still be drowsy the morning after taking this medication. This medication may affect your coordination, reaction time, or judgment. Do not drive or operate machinery until you know how this medication affects you. Sit up or stand slowly to   reduce the risk of dizzy or fainting spells. If you or your family notice any changes in your behavior, such as new or worsening depression, thoughts of harming yourself, anxiety, other unusual or disturbing thoughts, or memory loss, call your care team right away. After you stop taking this  medication, you may have trouble falling asleep. This is called rebound insomnia. This problem usually goes away on its own after 1 or 2 nights. What side effects may I notice from receiving this medication? Side effects that you should report to your care team as soon as possible: Allergic reactions or angioedema--skin rash, itching, hives, swelling of the face, eyes, lips, tongue, arms, or legs, trouble swallowing or breathing CNS depression--slow or shallow breathing, shortness of breath, feeling faint, dizziness, confusion, trouble staying awake Mood and behavior changes--anxiety, nervousness, confusion, hallucinations, irritability, hostility, thoughts of suicide or self-harm, worsening mood, feelings of depression Unusual sleep behaviors or activities you do not remember such as driving, eating, or sexual activity Side effects that usually do not require medical attention (report to your care team if they continue or are bothersome): Dizziness Drowsiness the day after use Dry mouth Headache Metallic taste in mouth This list may not describe all possible side effects. Call your doctor for medical advice about side effects. You may report side effects to FDA at 1-800-FDA-1088. Where should I keep my medication? Keep out of the reach of children. This medication can be abused. Keep your medication in a safe place to protect it from theft. Do not share this medication with anyone. Selling or giving away this medication is dangerous and against the law. This medication may cause accidental overdose and death if taken by other adults, children, or pets. Mix any unused medication with a substance like cat litter or coffee grounds. Then throw the medication away in a sealed container like a sealed bag or a coffee can with a lid. Do not use the medication after the expiration date. Store at room temperature between 15 and 30 degrees C (59 and 86 degrees F). NOTE: This sheet is a summary. It may not  cover all possible information. If you have questions about this medicine, talk to your doctor, pharmacist, or health care provider.  2022 Elsevier/Gold Standard (2020-09-18 00:00:00)  

## 2021-02-21 NOTE — Progress Notes (Signed)
Virtual Visit via Video Note  I connected with Stacy [name] on 02/21/21 at  3:20 PM EST by a video enabled telemedicine application and verified that I am speaking with the correct person using two identifiers.  Location Provider Location : [facility] Patient Location : Home  Participants: Patient , Provider   I discussed the limitations of evaluation and management by telemedicine and the availability of in person appointments. The patient expressed understanding and agreed to proceed.    I discussed the assessment and treatment plan with the patient. The patient was provided an opportunity to ask questions and all were answered. The patient agreed with the plan and demonstrated an understanding of the instructions.   The patient was advised to call back or seek an in-person evaluation if the symptoms worsen or if the condition fails to improve as anticipated.                                                [facility] MD OP Progress Note  02/21/2021 5:44 PM Kearra Calkin  MRN:  [id number]  Chief Complaint:  Chief Complaint   Follow-up; Anxiety; Depression    HPI: Stacy [name] is a 60 year old Caucasian female, unemployed, married, lives in [place],, has a history of bipolar disorder, GAD, insomnia, mild cognitive disorder, neuroleptic induced Parkinson's, Sjogren's syndrome, hypertension, hypothyroidism, arthritis, migraine headache was evaluated by telemedicine today.  Patient today reports she is currently struggling with racing thoughts which does have an impact on her sleep.  She has not been sleeping the past couple of nights.  She hence describes her mood as low, depressed due to having sleep problems.  She otherwise denies any significant anxiety, mood lability.  Denies any psychosis.  Patient is interested in a sleep aid, tried and failed medications in the past including trazodone-developed nightmares.  Discussed Johnnye Sima, agrees to try.  Patient with racing thoughts and  history of psychosis, was unable to start Rexulti due to insurance issues.  Patient has tried multiple medications in the past including risperidone, Latuda, Prolixin, Seroquel, Abilify, Zyprexa, Invega, Latuda, and multiple other medications.  Patient denies any other concerns today.  Visit Diagnosis:    ICD-10-CM   1. Bipolar 2 disorder, major depressive episode (HCC)  F31.81    mixed features, mild    2. GAD (generalized anxiety disorder)  F41.1     3. 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 Primary insomnia  F51.01 eszopiclone (LUNESTA) 1 MG TABS tablet      Past Psychiatric History: I have reviewed past psychiatric history from progress note on 12/11/2017.  Past trials of Paxil, Wellbutrin, risperidone, Latuda, Prolixin, Seroquel, Abilify, Zyprexa, Invega, Latuda, Topamax, Lexapro, Belsomra, Depakote.  Past Medical History:  Past Medical History:  Diagnosis Date   Aneurysm ([place])    Anxiety    Arthritis    Brain stem lesion    Chronic kidney disease    kideny stone   Depression    Family history of adverse reaction to anesthesia    mother has N/V   Fibromyalgia    GERD (gastroesophageal reflux disease)    Headache    Hypotension    Hypothyroid    Memory loss    Migraine    Raynaud disease    Sjogren's syndrome ([place])    Thyroid disease  Past Surgical History:  Procedure Laterality Date   ABDOMINAL HYSTERECTOMY     BASAL CELL CARCINOMA EXCISION     BREAST BIOPSY Left    CESAREAN SECTION     1982, 1984   CYSTOSCOPY WITH STENT PLACEMENT Bilateral 08/01/2020   Procedure: CYSTOSCOPY WITH STENT PLACEMENT;  Surgeon: Hollice Espy, MD;  Location: [facility];  Service: Urology;  Laterality: Bilateral;   CYSTOSCOPY/URETEROSCOPY/HOLMIUM LASER/STENT PLACEMENT Bilateral 08/14/2020   Procedure: CYSTOSCOPY/URETEROSCOPY/HOLMIUM LASER/STENT PLACEMENT;  Surgeon: Hollice Espy, MD;  Location: [facility];  Service:  Urology;  Laterality: Bilateral;   EYE SURGERY Right    cataract   KNEE SURGERY Left    torn meniscus   LITHOTRIPSY     MUSCLE BIOPSY     OOPHORECTOMY      Family Psychiatric History: Reviewed family psychiatric history from progress note on 12/10/2017  Family History:  Family History  Problem Relation Age of Onset   Migraines Mother    Anxiety disorder Mother    Depression Mother    Cancer Father    Hypertension Father    Stroke Father    Heart attack Father    Alcohol abuse Father    Diabetes Brother    Alcohol abuse Sister    Drug abuse Sister    Anxiety disorder Sister    Depression Sister    Drug abuse Brother    Cancer Paternal Aunt    Lupus Paternal Aunt    Thyroid disease Paternal Aunt    Diabetes Maternal Grandfather    Anxiety disorder Maternal Grandmother    Depression Maternal Grandmother     Social History: Reviewed social history from progress note on 12/10/2017 Social History   Socioeconomic History   Marital status: Married    Spouse name: [name]   Number of children: 2   Years of education: 14   Highest education level: Associate degree: occupational, Hotel manager, or vocational program  Occupational History   Occupation: Disabled  Tobacco Use   Smoking status: Never   Smokeless tobacco: Never  Vaping Use   Vaping Use: Never used  Substance and Sexual Activity   Alcohol use: No    Alcohol/week: 0.0 standard drinks   Drug use: Never   Sexual activity: Yes    Birth control/protection: Surgical  Other Topics Concern   Not on file  Social History Narrative   Lives at home with husband. [name]).    Disabled.   Education college.   Right handed.   2 cups caffeine/daily   Social Determinants of Health   Financial Resource Strain: Not on file  Food Insecurity: Not on file  Transportation Needs: Not on file  Physical Activity: Not on file  Stress: Not on file  Social Connections: Not on file    Allergies:  Allergies  Allergen  Reactions   Penicillins Hives   Amoxicillin Hives   Depakote [Divalproex Sodium]     vomiting   Lamictal [Lamotrigine]     Side effects of abdominal cramps, nausea , diarrhea ,nightmares    Metabolic Disorder Labs: Lab Results  Component Value Date   HGBA1C 5.0 11/13/2015   No results found for: PROLACTIN No results found for: CHOL, TRIG, HDL, CHOLHDL, VLDL, LDLCALC Lab Results  Component Value Date   TSH 1.780 10/26/2015   TSH 0.525 03/15/2015    Therapeutic Level Labs: No results found for: LITHIUM Lab Results  Component Value Date   VALPROATE 10 (L) 07/28/2019   No components found for:  CBMZ  Current Medications: Current  Outpatient Medications  Medication Sig Dispense Refill   eszopiclone (LUNESTA) 1 MG TABS tablet Take 1 tablet (1 mg total) by mouth at bedtime as needed for sleep. Take immediately before bedtime 15 tablet 1   alendronate (FOSAMAX) 70 MG tablet 1 tablet 30 minutes before the first food, beverage or medicine of the day dissolved in 4 ounces of water     Ascorbic Acid (VITAMIN C) 100 MG tablet Take 100 mg by mouth daily.     busPIRone (BUSPAR) 10 MG tablet Take 2 tablets (20 mg total) by mouth 2 (two) times daily. 120 tablet 1   Cholecalciferol (VITAMIN D-3) 1000 units CAPS Take 1,000 Units by mouth daily.     clonazePAM (KLONOPIN) 0.5 MG tablet Take 1 tablet (0.5 mg total) by mouth daily as needed for anxiety. AS NEEDED FOR SEVERE ANXIETY ATTACKS 15 tablet 2   donepezil (ARICEPT) 10 MG tablet Take 10 mg by mouth every morning.     gabapentin (NEURONTIN) 300 MG capsule Take 1-2 capsules (300-600 mg total) by mouth as directed. Take 1 capsule daily AM and 2 capsules daily PM 270 capsule 1   HYDROcodone-acetaminophen (NORCO/VICODIN) 5-325 MG tablet Take 1-2 tablets by mouth every 6 (six) hours as needed for moderate pain. 10 tablet 0   hydroxychloroquine (PLAQUENIL) 200 MG tablet Take 200 mg by mouth 2 (two) times daily.     levothyroxine (SYNTHROID,  LEVOTHROID) 175 MCG tablet Take 1 tablet (175 mcg total) by mouth daily before breakfast. 90 tablet 3   Multiple Vitamins-Minerals (PRESERVISION AREDS) CAPS Take 1 capsule by mouth daily.     omeprazole (PRILOSEC) 40 MG capsule Take 40 mg by mouth daily.     polyethylene glycol (MIRALAX / GLYCOLAX) 17 g packet Take 17 g by mouth daily. 14 each 0   SUMAtriptan (IMITREX) 100 MG tablet Take 100 mg by mouth every 2 (two) hours as needed for migraine.     vitamin E 45 MG (100 UNITS) capsule Take 100 Units by mouth daily.     No current facility-administered medications for this visit.     Musculoskeletal: Strength & Muscle Tone:  UTA Gait & Station:  Seated Patient leans: N/A  Psychiatric Specialty Exam: Review of Systems  Psychiatric/Behavioral:  Positive for dysphoric mood and sleep disturbance.   All other systems reviewed and are negative.  There were no vitals taken for this visit.There is no height or weight on file to calculate BMI.  General Appearance: Casual  Eye Contact:  Fair  Speech:  Clear and Coherent  Volume:  Normal  Mood:  Depressed  Affect:  Congruent  Thought Process:  Goal Directed and Descriptions of Associations: Intact  Orientation:  Full (Time, Place, and Person)  Thought Content: Rumination   Suicidal Thoughts:  No  Homicidal Thoughts:  No  Memory:  Immediate;   Fair Recent;   Fair Remote;   Limited  Judgement:  Fair  Insight:  Fair  Psychomotor Activity:  Normal  Concentration:  Concentration: Fair and Attention Span: Fair  Recall:  [employer] of Knowledge: Fair  Language: Fair  Akathisia:  No  Handed:  Right  AIMS (if indicated): not done  Assets:  Communication Skills Desire for Improvement Housing Intimacy Social Support  ADL's:  Intact  Cognition: Impaired,  Mild  Sleep:  Poor   Screenings: Administrator, Civil Service Office Visit from 06/15/2018 in [facility] Office Visit from 05/20/2018 in [facility]  Total Score 5 9      GAD-7    Flowsheet Row Video Visit from 09/21/2020 in [place] Video Visit from 07/26/2020 in [facility] from 11/11/2019 in [facility]  Total GAD-7 Score 12 8 18       [place] Office Visit from 01/01/2016 in [facility] Office Visit from 11/13/2015 in [facility] Office Visit from 10/26/2015 in [facility] Office Visit from 04/25/2015 in [facility] Office Visit from 10/25/2014 in [facility]  Total Score (max 30 points ) 25 26 21 23 29       PHQ2-9    [place] Visit from 01/30/2021 in [facility] Video Visit from 09/21/2020 in [facility] Video Visit from 07/26/2020 in [facility] from 02/17/2020 in [facility] Counselor from 11/11/2019 in [place]  PHQ-2 Total Score 2 2 1 6 6   PHQ-9 Total Score 13 12 -- 18 21      Flowsheet Row Counselor from 01/31/2021 in [facility] Office Visit from 01/30/2021 in [place] Video Visit from 09/21/2020 in [place] No Risk No Risk No Risk        Assessment and Plan: Stacy [name] is a 61 year old Caucasian female who has a history of bipolar disorder type II, neuroleptic induced Parkinson's disease, insomnia, cognitive disorder, Sjogren's syndrome, hypothyroidism, fibromyalgia was evaluated by telemedicine today.  Patient is currently struggling with racing thoughts, sleep problems, will benefit from the following plan.  Plan Bipolar disorder in partial remission Gabapentin 300 mg p.o. daily in the morning and 600 mg every  afternoon. Start Rexulti 0.25 mg p.o. nightly for 14 days and increase to 0.5 mg p.o. nightly after that at bedtime-will provide samples.  GAD-improving BuSpar 20 mg p.o. twice daily Gabapentin as prescribed Klonopin 0.5 mg as needed for severe anxiety attacks.  Patient has been limiting use Hydroxyzine 25 mg p.o. daily as needed for severe anxiety attacks  Mild neurocognitive disorder with behavioral disturbance-improving Continue Aricept as prescribed by her neurologist  Primary insomnia-unstable Start Lunesta 1 mg p.o. nightly as needed Provided medication education  Follow-up in clinic in 2 weeks or sooner if needed.  This note was generated in part or whole with voice recognition software. Voice recognition is usually quite accurate but there are transcription errors that can and very often do occur. I apologize for any typographical errors that were not detected and corrected.     Ursula Alert, MD 02/21/2021, 5:44 PM

## 2021-02-22 MED ORDER — BREXPIPRAZOLE 1 MG PO TABS: 0.5000 mg | ORAL_TABLET | Freq: Every day | ORAL | 0 refills | Status: DC

## 2021-02-22 MED ORDER — REXULTI 0.5 MG PO TABS: 0.5000 | ORAL_TABLET | Freq: Every day | ORAL | 0 refills | Status: DC

## 2021-02-22 NOTE — Telephone Encounter (Signed)
Medication management - Patient came in today to pick up supplied samples of Rexulti 0.5 mg, 7 tablets to take 1/2 of the 0.5 mg dosage for two weeks and the Rexult 1 mg, 7 tablets to start after that taking 1/2 of the 1mg  for the two weeks after. Dr. Shea Evans reviewed written instructions for patient for the samples provided and patient stated understanding how to start and taper up dosage for the next 28 days.  Patient to keep next appointment with Dr. Shea Evans on 03/13/21 and to call if any problems with medication start.

## 2021-03-09 ENCOUNTER — Telehealth: Payer: Self-pay

## 2021-03-09 DIAGNOSIS — F411 Generalized anxiety disorder: Secondary | ICD-10-CM

## 2021-03-09 MED ORDER — CLONAZEPAM 0.5 MG PO TABS: 0.5000 mg | ORAL_TABLET | Freq: Every day | ORAL | 2 refills | Status: DC | PRN

## 2021-03-09 NOTE — Telephone Encounter (Signed)
I have sent Klonopin to pharmacy. 

## 2021-03-09 NOTE — Telephone Encounter (Signed)
pt left a message that she needs refills on klonopin

## 2021-03-13 ENCOUNTER — Telehealth (INDEPENDENT_AMBULATORY_CARE_PROVIDER_SITE_OTHER): Payer: 59 | Admitting: Psychiatry

## 2021-03-13 ENCOUNTER — Encounter: Payer: Self-pay | Admitting: Psychiatry

## 2021-03-13 ENCOUNTER — Other Ambulatory Visit: Payer: Self-pay

## 2021-03-13 DIAGNOSIS — F02A18 Dementia in other diseases classified elsewhere, mild, with other behavioral disturbance: Secondary | ICD-10-CM

## 2021-03-13 DIAGNOSIS — F5101 Primary insomnia: Secondary | ICD-10-CM | POA: Diagnosis not present

## 2021-03-13 DIAGNOSIS — F3181 Bipolar II disorder: Secondary | ICD-10-CM | POA: Diagnosis not present

## 2021-03-13 DIAGNOSIS — F411 Generalized anxiety disorder: Secondary | ICD-10-CM | POA: Diagnosis not present

## 2021-03-13 MED ORDER — BREXPIPRAZOLE 2 MG PO TABS: 2.0000 mg | ORAL_TABLET | Freq: Every day | ORAL | 0 refills | Status: DC

## 2021-03-13 NOTE — Progress Notes (Signed)
Virtual Visit via Video Note  I connected with Stacy [name] on 03/13/21 at  4:30 PM EST by a video enabled telemedicine application and verified that I am speaking with the correct person using two identifiers.  Location Provider Location : [facility] Patient Location : Home  Participants: Patient , Provider    I discussed the limitations of evaluation and management by telemedicine and the availability of in person appointments. The patient expressed understanding and agreed to proceed.    I discussed the assessment and treatment plan with the patient. The patient was provided an opportunity to ask questions and all were answered. The patient agreed with the plan and demonstrated an understanding of the instructions.   The patient was advised to call back or seek an in-person evaluation if the symptoms worsen or if the condition fails to improve as anticipated.   [facility] MD OP Progress Note  03/13/2021 4:55 PM Stacy [name]  MRN:  [id number]  Chief Complaint:  Chief Complaint   Follow-up; Anxiety; Depression    HPI: Stacy [name] is a 61 year old Caucasian female, unemployed, married, lives in [place], has a history of bipolar disorder, GAD, insomnia, mild cognitive disorder, neuroleptic induced Parkinson's, Sjogren's syndrome, hypertension, hypothyroidism, arthritis, migraine headache was evaluated by telemedicine today.  Patient today appeared to be tearful in session.  She reports she has noticed improvement in her mood since being on the [employer].  She also denies side effects to the [employer].  She however reports lately she has been getting obsessed with whatever is going on in the world.  She reports that she listens to the news every day that makes her very anxious.  She reports since there is a lot of negative things on the news she is anxious about something bad going to happen in the future.  She also reports she has been buying things like groceries and other things and  storing them for the future just in case something bad happens.  She reports Rexulti did not make it worse and that this has been going on since the past several months.  Patient reports she is often anxious, worries about different things .  She does have Klonopin available however she has been limiting use.  She does have upcoming appointment for therapy and is willing to keep it.  Patient reports sleep is overall okay and does have Lunesta available.  Patient reports she has not had any hallucinations since the past 2 weeks, Rexulti likely helpful with that.  Patient reports 2 weeks ago she had this feeling of a presence in her room which usually happens at night.  Patient denies any suicidality, homicidality.  Reports memory changes are currently stable.  Denies any other concerns today.  Visit Diagnosis:    ICD-10-CM   1. Bipolar 2 disorder, major depressive episode (HCC)  F31.81 brexpiprazole (REXULTI) 2 MG TABS tablet   mixed, mild    2. GAD (generalized anxiety disorder)  F41.1     3. 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 Primary insomnia  F51.01       Past Psychiatric History: Reviewed past psychiatric history from progress note on 12/11/2017.  Past trials of Paxil, Wellbutrin, risperidone, Latuda, Prolixin, Seroquel, Abilify, Zyprexa, Invega, Topamax, Lexapro, Belsomra, Depakote  Past Medical History:  Past Medical History:  Diagnosis Date   Aneurysm ([place])    Anxiety    Arthritis    Brain stem lesion    Chronic  kidney disease    kideny stone   Depression    Family history of adverse reaction to anesthesia    mother has N/V   Fibromyalgia    GERD (gastroesophageal reflux disease)    Headache    Hypotension    Hypothyroid    Memory loss    Migraine    Raynaud disease    Sjogren's syndrome ([place])    Thyroid disease     Past Surgical History:  Procedure Laterality Date   ABDOMINAL HYSTERECTOMY      BASAL CELL CARCINOMA EXCISION     BREAST BIOPSY Left    CESAREAN SECTION     1982, 1984   CYSTOSCOPY WITH STENT PLACEMENT Bilateral 08/01/2020   Procedure: CYSTOSCOPY WITH STENT PLACEMENT;  Surgeon: Hollice Espy, MD;  Location: [facility];  Service: Urology;  Laterality: Bilateral;   CYSTOSCOPY/URETEROSCOPY/HOLMIUM LASER/STENT PLACEMENT Bilateral 08/14/2020   Procedure: CYSTOSCOPY/URETEROSCOPY/HOLMIUM LASER/STENT PLACEMENT;  Surgeon: Hollice Espy, MD;  Location: [facility];  Service: Urology;  Laterality: Bilateral;   EYE SURGERY Right    cataract   KNEE SURGERY Left    torn meniscus   LITHOTRIPSY     MUSCLE BIOPSY     OOPHORECTOMY      Family Psychiatric History: Reviewed family psychiatric history from progress note on 12/11/2017.  Family History:  Family History  Problem Relation Age of Onset   Migraines Mother    Anxiety disorder Mother    Depression Mother    Cancer Father    Hypertension Father    Stroke Father    Heart attack Father    Alcohol abuse Father    Diabetes Brother    Alcohol abuse Sister    Drug abuse Sister    Anxiety disorder Sister    Depression Sister    Drug abuse Brother    Cancer Paternal Aunt    Lupus Paternal Aunt    Thyroid disease Paternal Aunt    Diabetes Maternal Grandfather    Anxiety disorder Maternal Grandmother    Depression Maternal Grandmother     Social History: Reviewed social history from progress note on 12/11/2017. Social History   Socioeconomic History   Marital status: Married    Spouse name: Stacy [name]   Number of children: 2   Years of education: 14   Highest education level: Associate degree: occupational, Hotel manager, or vocational program  Occupational History   Occupation: Disabled  Tobacco Use   Smoking status: Never   Smokeless tobacco: Never  Vaping Use   Vaping Use: Never used  Substance and Sexual Activity   Alcohol use: No    Alcohol/week: 0.0 standard drinks   Drug use: Never   Sexual activity:  Yes    Birth control/protection: Surgical  Other Topics Concern   Not on file  Social History Narrative   Lives at home with husband. Stacy [name]).    Disabled.   Education college.   Right handed.   2 cups caffeine/daily   Social Determinants of Health   Financial Resource Strain: Not on file  Food Insecurity: Not on file  Transportation Needs: Not on file  Physical Activity: Not on file  Stress: Not on file  Social Connections: Not on file    Allergies:  Allergies  Allergen Reactions   Penicillins Hives   Amoxicillin Hives   Depakote [Divalproex Sodium]     vomiting   Lamictal [Lamotrigine]     Side effects of abdominal cramps, nausea , diarrhea ,nightmares    Metabolic Disorder Labs: Lab Results  Component Value Date   HGBA1C 5.0 11/13/2015   No results found for: PROLACTIN No results found for: CHOL, TRIG, HDL, CHOLHDL, VLDL, LDLCALC Lab Results  Component Value Date   TSH 1.780 10/26/2015   TSH 0.525 03/15/2015    Therapeutic Level Labs: No results found for: LITHIUM Lab Results  Component Value Date   VALPROATE 10 (L) 07/28/2019   No components found for:  CBMZ  Current Medications: Current Outpatient Medications  Medication Sig Dispense Refill   brexpiprazole (REXULTI) 2 MG TABS tablet Take 1 tablet (2 mg total) by mouth daily. 90 tablet 0   alendronate (FOSAMAX) 70 MG tablet 1 tablet 30 minutes before the first food, beverage or medicine of the day dissolved in 4 ounces of water     Ascorbic Acid (VITAMIN C) 100 MG tablet Take 100 mg by mouth daily.     busPIRone (BUSPAR) 10 MG tablet Take 2 tablets (20 mg total) by mouth 2 (two) times daily. 120 tablet 1   cevimeline (EVOXAC) 30 MG capsule Take 30 mg by mouth 3 (three) times daily.     Cholecalciferol (VITAMIN D-3) 1000 units CAPS Take 1,000 Units by mouth daily.     clonazePAM (KLONOPIN) 0.5 MG tablet Take 1 tablet (0.5 mg total) by mouth daily as needed for anxiety. AS NEEDED FOR SEVERE ANXIETY  ATTACKS 15 tablet 2   donepezil (ARICEPT) 10 MG tablet Take 10 mg by mouth every morning.     eszopiclone (LUNESTA) 1 MG TABS tablet Take 1 tablet (1 mg total) by mouth at bedtime as needed for sleep. Take immediately before bedtime 15 tablet 1   gabapentin (NEURONTIN) 300 MG capsule Take 1-2 capsules (300-600 mg total) by mouth as directed. Take 1 capsule daily AM and 2 capsules daily PM 270 capsule 1   HYDROcodone-acetaminophen (NORCO/VICODIN) 5-325 MG tablet Take 1-2 tablets by mouth every 6 (six) hours as needed for moderate pain. 10 tablet 0   hydroxychloroquine (PLAQUENIL) 200 MG tablet Take 200 mg by mouth 2 (two) times daily.     levothyroxine (SYNTHROID, LEVOTHROID) 175 MCG tablet Take 1 tablet (175 mcg total) by mouth daily before breakfast. 90 tablet 3   Multiple Vitamins-Minerals (PRESERVISION AREDS) CAPS Take 1 capsule by mouth daily.     omeprazole (PRILOSEC) 40 MG capsule Take 40 mg by mouth daily.     polyethylene glycol (MIRALAX / GLYCOLAX) 17 g packet Take 17 g by mouth daily. 14 each 0   solifenacin (VESICARE) 5 MG tablet Take 5 mg by mouth daily.     SUMAtriptan (IMITREX) 100 MG tablet Take 100 mg by mouth every 2 (two) hours as needed for migraine.     vitamin E 45 MG (100 UNITS) capsule Take 100 Units by mouth daily.     No current facility-administered medications for this visit.     Musculoskeletal: Strength & Muscle Tone:  UTA Gait & Station:  Seated Patient leans: N/A  Psychiatric Specialty Exam: Review of Systems  Psychiatric/Behavioral:  Positive for dysphoric mood. The patient is nervous/anxious.   All other systems reviewed and are negative.  There were no vitals taken for this visit.There is no height or weight on file to calculate BMI.  General Appearance: Casual  Eye Contact:  Fair  Speech:  Clear and Coherent  Volume:  Normal  Mood:  Anxious and Dysphoric  Affect:  Tearful  Thought Process:  Goal Directed and Descriptions of Associations: Intact   Orientation:  Full (Time, Place, and Person)  Thought Content: Logical   Suicidal Thoughts:  No  Homicidal Thoughts:  No  Memory:  Immediate;   Fair Recent;   Fair Remote;   Poor  Judgement:  Fair  Insight:  Fair  Psychomotor Activity:  Normal  Concentration:  Concentration: Fair and Attention Span: Fair  Recall:  [employer] of Knowledge: Fair  Language: Fair  Akathisia:  No  Handed:  Right  AIMS (if indicated): done  Assets:  Communication Skills Desire for Improvement Housing Intimacy Social Support  ADL's:  Intact  Cognition: Impaired,  Mild stable  Sleep:  Fair   Screenings: AIMS    [place] Office Visit from 06/15/2018 in [facility] Office Visit from 05/20/2018 in [facility] Total Score 5 9      GAD-7    Flowsheet Row Video Visit from 03/13/2021 in [facility] Video Visit from 09/21/2020 in [facility] Video Visit from 07/26/2020 in [facility] from 11/11/2019 in [facility]  Total GAD-7 Score 15 12 8 18       [place] from 01/01/2016 in [facility] Office Visit from 11/13/2015 in [facility] Office Visit from 10/26/2015 in [facility] Office Visit from 04/25/2015 in [facility] Office Visit from 10/25/2014 in [facility]  Total Score (max 30 points ) 25 26 21 23 29       PHQ2-9    Flowsheet Row Video Visit from 03/13/2021 in [facility] Visit from 01/30/2021 in [facility] Video Visit from 09/21/2020 in [facility] Video Visit from 07/26/2020 in [facility] from 02/17/2020 in [facility]  PHQ-2 Total Score  4 2 2 1 6   PHQ-9 Total Score 12 13 12  -- 18      Flowsheet Row Video Visit from 03/13/2021 in [facility] from 01/31/2021 in [facility] Visit from 01/30/2021 in [facility] No Risk No Risk No Risk        Assessment and Plan: Stacy [name] is a 61 year old Caucasian female who has a history of bipolar disorder type II, neuroleptic induced Parkinson's disease, insomnia, cognitive disorder, Sjogren's syndrome, hypothyroidism, fibromyalgia was evaluated by telemedicine today.  Patient with anxiety, racing thoughts, will benefit from psychotherapy sessions, referral for intensive outpatient program as well as medication changes as noted below.  Plan Bipolar disorder-unstable Gabapentin 300 mg p.o. daily in the morning and 600 mg every afternoon Increase Rexulti to 2 mg p.o. nightly.  I have sent a prescription to her pharmacy. However she could stay on the Rexulti 1 mg p.o. nightly until she can get the prescription filled at the pharmacy.  GAD-unstable We will consider starting an SNRI in the future. Continue BuSpar p.o. twice daily Gabapentin as prescribed Klonopin 0.5 mg as needed for severe anxiety attacks. Will refer for intensive outpatient program/PHP.  I have sent communication to Ms. Dellia Nims. Patient also to keep her appointment with individual therapist which is coming up.  Mild neurocognitive disorder with behavioral disturbances-stable Continue Aricept as prescribed by neurology  Primary insomnia-improving Lunesta 1 mg p.o. nightly Continue to work on sleep hygiene.  Follow-up in clinic in 3 weeks or sooner in person.  This note was generated in part or whole with voice recognition software. Voice recognition is  usually quite accurate but there are transcription errors that can and very often do occur. I apologize for any typographical errors  that were not detected and corrected.      Ursula Alert, MD 03/14/2021, 4:06 PM

## 2021-03-16 ENCOUNTER — Telehealth (HOSPITAL_COMMUNITY): Payer: Self-pay | Admitting: Professional

## 2021-03-21 ENCOUNTER — Ambulatory Visit (INDEPENDENT_AMBULATORY_CARE_PROVIDER_SITE_OTHER): Payer: 59 | Admitting: Licensed Clinical Social Worker

## 2021-03-21 ENCOUNTER — Other Ambulatory Visit: Payer: Self-pay

## 2021-03-21 DIAGNOSIS — F3181 Bipolar II disorder: Secondary | ICD-10-CM | POA: Diagnosis not present

## 2021-03-21 DIAGNOSIS — F411 Generalized anxiety disorder: Secondary | ICD-10-CM

## 2021-03-21 NOTE — Plan of Care (Signed)
°  Problem: Bipolar Disorder CCP Problem  1 Alleviate depressive/manic symptoms and return to improved levels of effective functioning per self report 3 out of 5 sessions documented Goal: LTG: Stabilize mood and increase goal-directed behavior: Input needed on appropriate metric Outcome: Not Progressing Goal: STG: Reduce frequency, intensity, and duration of depression symptoms as evidenced by pt self report 3 out of 5 sessions documented Outcome: Not Progressing Intervention: Encourage patient to identify triggers Intervention: Discuss self-management skills Intervention: [place]

## 2021-03-21 NOTE — Progress Notes (Signed)
°  THERAPIST PROGRESS NOTE  Session Time: 10-11a  Participation Level: Active  Behavioral Response: NeatAlertAnxious and Depressed  Type of Therapy: Individual Therapy  Treatment Goals addressed:  Problem: Bipolar Disorder CCP Problem  1 Alleviate depressive/manic symptoms and return to improved levels of effective functioning per self report 3 out of 5 sessions documented  Goal: LTG: Stabilize mood and increase goal-directed behavior: Input needed on appropriate metric Outcome: Not Progressing  Goal: STG: Reduce frequency, intensity, and duration of depression symptoms as evidenced by pt self report 3 out of 5 sessions documented Outcome: Not Progressing  Interventions: Intervention: Encourage patient to identify triggers Intervention: Discuss self-management skills Intervention: [place]   Summary: Stacy [name] is a 61 y.o. female who presents with continuing symptoms related to bipolar disorder diagnosis. Patient reports that she is experiencing more depression symptoms along with increased anxiety symptoms. Pt reports she d/c medication because her samples ran out, insurance co denied to cover meds, and she cannot pay the full price of the medication.   Allowed patients a space to explore and express thoughts and feelings associated with recent external stressors and life events.  Pt reports that she feels mood/anxiety triggers are news/current events. Pt reports that she watches news 6+ hours a day with her husband and that if she is not watching it on television then she is searching news on her phone. Pt has very dark perception of current events/ "what-if's" and has found that she is obsessively seeking and purchasing food for the future. Pts husband is supportive of this behavior and has offered to transform rooms/build shelves for items purchased.  Pt reports that she doesn't know if she wants to stop watching the news and/or purchase food items.  Pt says her anxiety will be higher if she does not have food items. Used CBT to help pt identify irrational fears and use the thought stopping method and challenging irrational thoughts.   Pt states her main worries are:  open borders ([place]), school violence, overall crime rate.   Reviewed coping skills to manage depression and anxiety symptoms. Pt reports that she has been trying to go to the [employer] more, which pt finds beneficial.   Continued recommendations are as follows: self care behaviors, positive social engagements, focusing on overall work/home/life balance, and focusing on positive physical and emotional wellness.  .   Suicidal/Homicidal: No  Therapist Response: Stacy [name] is being intentional about identifying and utilizing coping skills that are helpful in managing symptoms. Patient is being deliberate about increasing levels of social engagement and physical activity.These behaviors are reflective of both personal growth and progress. Treatment to continue as indicated.  Plan: Return again in 4 weeks.  Diagnosis: Axis I: Bipolar II, depressed; GAD    Axis II: No diagnosis    Rachel Bo Janene Yousuf, LCSW 03/21/2021

## 2021-03-27 ENCOUNTER — Encounter: Payer: Self-pay | Admitting: Psychiatry

## 2021-03-27 ENCOUNTER — Other Ambulatory Visit: Payer: Self-pay

## 2021-03-27 ENCOUNTER — Ambulatory Visit (INDEPENDENT_AMBULATORY_CARE_PROVIDER_SITE_OTHER): Payer: 59 | Admitting: Psychiatry

## 2021-03-27 VITALS — BP 118/81 | HR 80 | Temp 98.3°F | Wt 163.4 lb

## 2021-03-27 DIAGNOSIS — F3175 Bipolar disorder, in partial remission, most recent episode depressed: Secondary | ICD-10-CM

## 2021-03-27 DIAGNOSIS — F411 Generalized anxiety disorder: Secondary | ICD-10-CM

## 2021-03-27 DIAGNOSIS — F02A18 Dementia in other diseases classified elsewhere, mild, with other behavioral disturbance: Secondary | ICD-10-CM | POA: Diagnosis not present

## 2021-03-27 DIAGNOSIS — F5101 Primary insomnia: Secondary | ICD-10-CM | POA: Diagnosis not present

## 2021-03-27 MED ORDER — VENLAFAXINE HCL ER 37.5 MG PO CP24: 37.5000 mg | ORAL_CAPSULE | Freq: Every day | ORAL | 1 refills | Status: DC

## 2021-03-27 NOTE — Patient Instructions (Signed)
Venlafaxine Tablets What is this medication? VENLAFAXINE (VEN la fax een) treats depression and anxiety. It increases the amount of serotonin and norepinephrine in the brain, hormones that help regulate mood. It belongs to a group of medications called SNRIs. This medicine may be used for other purposes; ask your health care provider or pharmacist if you have questions. COMMON BRAND NAME(S): Effexor What should I tell my care team before I take this medication? They need to know if you have any of these conditions: Bleeding disorders Glaucoma Heart disease High blood pressure High cholesterol Kidney disease Liver disease Low levels of sodium in the blood Mania or bipolar disorder Seizures Suicidal thoughts, plans, or attempt; a previous suicide attempt by you or a family member Take medications that treat or prevent blood clots Thyroid disease An unusual or allergic reaction to venlafaxine, desvenlafaxine, other medications, foods, dyes, or preservatives Pregnant or trying to get pregnant Breast-feeding How should I use this medication? Take this medication by mouth with a glass of water. Follow the directions on the prescription label. Take it with food. Take your medication at regular intervals. Do not take your medication more often than directed. Do not stop taking this medication suddenly except upon the advice of your care team. Stopping this medication too quickly may cause serious side effects or your condition may worsen. A special MedGuide will be given to you by the pharmacist with each prescription and refill. Be sure to read this information carefully each time. Talk to your care team regarding the use of this medication in children. Special care may be needed. Overdosage: If you think you have taken too much of this medicine contact a poison control center or emergency room at once. NOTE: This medicine is only for you. Do not share this medicine with others. What if I miss  a dose? If you miss a dose, take it as soon as you can. If it is almost time for your next dose, take only that dose. Do not take double or extra doses. What may interact with this medication? Do not take this medication with any of the following: Certain medications for fungal infections like fluconazole, itraconazole, ketoconazole, posaconazole, voriconazole Cisapride Desvenlafaxine Dronedarone Duloxetine Levomilnacipran Linezolid MAOIs like Carbex, Eldepryl, Marplan, Nardil, and Parnate Methylene blue (injected into a vein) Milnacipran Pimozide Thioridazine This medication may also interact with the following: Amphetamines Aspirin and aspirin-like medications Certain medications for depression, anxiety, or psychotic disturbances Certain medications for migraine headaches like almotriptan, eletriptan, frovatriptan, naratriptan, rizatriptan, sumatriptan, zolmitriptan Certain medications for sleep Certain medications that treat or prevent blood clots like dalteparin, enoxaparin, warfarin Cimetidine Clozapine Diuretics Fentanyl Furazolidone Indinavir Isoniazid Lithium Metoprolol NSAIDS, medications for pain and inflammation, like ibuprofen or naproxen Other medications that prolong the QT interval (cause an abnormal heart rhythm) like dofetilide, ziprasidone Procarbazine Rasagiline Supplements like St. John's wort, kava kava, valerian Tramadol Tryptophan This list may not describe all possible interactions. Give your health care provider a list of all the medicines, herbs, non-prescription drugs, or dietary supplements you use. Also tell them if you smoke, drink alcohol, or use illegal drugs. Some items may interact with your medicine. What should I watch for while using this medication? Tell your care team if your symptoms do not get better or if they get worse. Visit your care team for regular checks on your progress. Because it may take several weeks to see the full effects  of this medication, it is important to continue your treatment as prescribed by  your care team. Watch for new or worsening thoughts of suicide or depression. This includes sudden changes in mood, behaviors, or thoughts. These changes can happen at any time but are more common in the beginning of treatment or after a change in dose. Call your care team right away if you experience these thoughts or worsening depression. Manic episodes may happen in patients with bipolar disorder who take this medication. Watch for changes in feelings or behaviors such as feeling anxious, nervous, agitated, panicky, irritable, hostile, aggressive, impulsive, severely restless, overly excited and hyperactive, or trouble sleeping. These changes can happen at any time but are more common in the beginning of treatment or after a change in dose. Call your care team right away if you notice any of these symptoms. This medication can cause an increase in blood pressure. Check with your care team for instructions on monitoring your blood pressure while taking this medication. You may get drowsy or dizzy. Do not drive, use machinery, or do anything that needs mental alertness until you know how this medication affects you. Do not stand or sit up quickly, especially if you are an older patient. This reduces the risk of dizzy or fainting spells. Alcohol may interfere with the effect of this medication. Avoid alcoholic drinks. Your mouth may get dry. Chewing sugarless gum, sucking hard candy and drinking plenty of water will help. Contact your care team, if the problem does not go away or is severe. What side effects may I notice from receiving this medication? Side effects that you should report to your care team as soon as possible: Allergic reactions--skin rash, itching, hives, swelling of the face, lips, tongue, or throat Bleeding--bloody or black, tar-like stools, red or dark brown urine, vomiting blood or brown material that looks  like coffee grounds, small, red or purple spots on skin, unusual bleeding or bruising Heart rhythm changes--fast or irregular heartbeat, dizziness, feeling faint or lightheaded, chest pain, trouble breathing Increase in blood pressure Loss of appetite with weight loss Low sodium level--muscle weakness, fatigue, dizziness, headache, confusion Serotonin syndrome--irritability, confusion, fast or irregular heartbeat, muscle stiffness, twitching muscles, sweating, high fever, seizures, chills, vomiting, diarrhea Sudden eye pain or change in vision such as blurry vision, seeing halos around lights, vision loss Thoughts of suicide or self-harm, worsening mood, feelings of depression Side effects that usually do not require medical attention (report to your care team if they continue or are bothersome): Anxiety, nervousness Change in sex drive or performance Dizziness Dry mouth Excessive sweating Nausea Tremors or shaking Trouble sleeping This list may not describe all possible side effects. Call your doctor for medical advice about side effects. You may report side effects to FDA at 1-800-FDA-1088. Where should I keep my medication? Keep out of the reach of children and pets. Store at a controlled temperature between 20 and 25 degrees C (68 and 77 degrees F), in a dry place. Throw away any unused medication after the expiration date. NOTE: This sheet is a summary. It may not cover all possible information. If you have questions about this medicine, talk to your doctor, pharmacist, or health care provider.  [facility] - INTENSIVE OUT PATIENT OR PARTIAL HOSPITALIZATION - [place] [phone]   2022 Elsevier/Gold Standard (2020-02-25 00:00:00)

## 2021-03-27 NOTE — Progress Notes (Signed)
[facility] MD OP Progress Note  03/27/2021 4:23 PM Stacy [name]  MRN:  [id number]  Chief Complaint:  Chief Complaint   Follow-up 61 year old Caucasian female who has a history of bipolar disorder, generalized anxiety disorder, major neurocognitive disorder, primary insomnia, presented for medication management with worsening anxiety symptoms.    HPI: Stacy [name] is a 61 year old Caucasian female, unemployed, married, lives in [place], has a history of bipolar disorder, GAD, insomnia, mild cognitive disorder, neuroleptic induced Parkinson's, Sjogren's syndrome, hypertension, hypothyroidism, arthritis, migraine headache was evaluated in office today.  Patient reports she has been struggling with worsening anxiety symptoms since the past several weeks.  She reports all the negative news going on currently worries her a lot.  She reports she has been spending a lot of time buying and hoarding grocery items just because she is worried that something bad might happen in the world.  Patient reports she is currently working with a therapist and actively trying to quit watching the news and quit buying stuff that she does not need.  Patient does report racing thoughts, feeling restless often, since the past few weeks.  Patient did not appear to have any auditory or visual hallucinations.  Did not appear to be preoccupied with any delusions.  Denies any significant mood swings or depression at this time.  Reports sleep as good.  Has been sleeping without the Lunesta.  Patient is agreeable to starting intensive outpatient program or PHP and agrees to contact community resources provided.  Patient currently is not on the [place], has been noncompliant mostly due to cost.  Denies any suicidality or homicidality.  Denies any significant memory changes since her last visit.  Reports she has been staying active and trying to go to the gym.  Husband is supportive.  Patient denies any other concerns  today.  Visit Diagnosis:    ICD-10-CM   1. Bipolar disorder, in partial remission, most recent episode depressed ([place])  F31.75    TYPE 2    2. GAD (generalized anxiety disorder)  F41.1 venlafaxine XR (EFFEXOR-XR) 37.5 MG 24 hr capsule    3. 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 Primary insomnia  F51.01       Past Psychiatric History: Reviewed past psychiatric history from progress note on 12/11/2017.  Past trials of Paxil, Wellbutrin, risperidone, Latuda, Prolixin, Seroquel, Abilify, Zyprexa, Invega, Topamax, Lexapro, Belsomra, Depakote, Rexulti-could not afford  Past Medical History:  Past Medical History:  Diagnosis Date   Aneurysm ([place])    Anxiety    Arthritis    Brain stem lesion    Chronic kidney disease    kideny stone   Depression    Family history of adverse reaction to anesthesia    mother has N/V   Fibromyalgia    GERD (gastroesophageal reflux disease)    Headache    Hypotension    Hypothyroid    Memory loss    Migraine    Raynaud disease    Sjogren's syndrome ([place])    Thyroid disease     Past Surgical History:  Procedure Laterality Date   ABDOMINAL HYSTERECTOMY     BASAL CELL CARCINOMA EXCISION     BREAST BIOPSY Left    CESAREAN [place] Bilateral 08/01/2020   Procedure: CYSTOSCOPY WITH STENT PLACEMENT;  Surgeon: Hollice Espy, MD;  Location: [facility] ORS;  Service: Urology;  Laterality: Bilateral;  CYSTOSCOPY/URETEROSCOPY/HOLMIUM LASER/STENT PLACEMENT Bilateral 08/14/2020   Procedure: CYSTOSCOPY/URETEROSCOPY/HOLMIUM LASER/STENT PLACEMENT;  Surgeon: Hollice Espy, MD;  Location: [facility] ORS;  Service: Urology;  Laterality: Bilateral;   EYE SURGERY Right    cataract   KNEE SURGERY Left    torn meniscus   LITHOTRIPSY     MUSCLE BIOPSY     OOPHORECTOMY      Family Psychiatric History: Reviewed family psychiatric history from progress note  on 12/11/2017.  Family History:  Family History  Problem Relation Age of Onset   Migraines Mother    Anxiety disorder Mother    Depression Mother    Cancer Father    Hypertension Father    Stroke Father    Heart attack Father    Alcohol abuse Father    Diabetes Brother    Alcohol abuse Sister    Drug abuse Sister    Anxiety disorder Sister    Depression Sister    Drug abuse Brother    Cancer Paternal Aunt    Lupus Paternal Aunt    Thyroid disease Paternal Aunt    Diabetes Maternal Grandfather    Anxiety disorder Maternal Grandmother    Depression Maternal Grandmother     Social History: Reviewed social history from progress note on 12/11/2017. Social History   Socioeconomic History   Marital status: Married    Spouse name: Stacy [name]   Number of children: 2   Years of education: 14   Highest education level: Associate degree: occupational, Hotel manager, or vocational program  Occupational History   Occupation: Disabled  Tobacco Use   Smoking status: Never   Smokeless tobacco: Never  Vaping Use   Vaping Use: Never used  Substance and Sexual Activity   Alcohol use: No    Alcohol/week: 0.0 standard drinks   Drug use: Never   Sexual activity: Yes    Birth control/protection: Surgical  Other Topics Concern   Not on file  Social History Narrative   Lives at home with husband. Stacy [name]).    Disabled.   Education college.   Right handed.   2 cups caffeine/daily   Social Determinants of Health   Financial Resource Strain: Not on file  Food Insecurity: Not on file  Transportation Needs: Not on file  Physical Activity: Not on file  Stress: Not on file  Social Connections: Not on file    Allergies:  Allergies  Allergen Reactions   Penicillins Hives   Amoxicillin Hives   Depakote [Divalproex Sodium]     vomiting   Lamictal [Lamotrigine]     Side effects of abdominal cramps, nausea , diarrhea ,nightmares    Metabolic Disorder Labs: Lab Results  Component  Value Date   HGBA1C 5.0 11/13/2015   No results found for: PROLACTIN No results found for: CHOL, TRIG, HDL, CHOLHDL, VLDL, LDLCALC Lab Results  Component Value Date   TSH 1.780 10/26/2015   TSH 0.525 03/15/2015    Therapeutic Level Labs: No results found for: LITHIUM Lab Results  Component Value Date   VALPROATE 10 (L) 07/28/2019   No components found for:  CBMZ  Current Medications: Current Outpatient Medications  Medication Sig Dispense Refill   alendronate (FOSAMAX) 70 MG tablet 1 tablet 30 minutes before the first food, beverage or medicine of the day dissolved in 4 ounces of water     Ascorbic Acid (VITAMIN C) 100 MG tablet Take 100 mg by mouth daily.     busPIRone (BUSPAR) 10 MG tablet Take 2 tablets (20 mg total) by mouth 2 (  two) times daily. 120 tablet 1   cevimeline (EVOXAC) 30 MG capsule Take 30 mg by mouth 3 (three) times daily.     Cholecalciferol (VITAMIN D-3) 1000 units CAPS Take 1,000 Units by mouth daily.     clonazePAM (KLONOPIN) 0.5 MG tablet Take 1 tablet (0.5 mg total) by mouth daily as needed for anxiety. AS NEEDED FOR SEVERE ANXIETY ATTACKS 15 tablet 2   donepezil (ARICEPT) 10 MG tablet Take 10 mg by mouth every morning.     eszopiclone (LUNESTA) 1 MG TABS tablet Take 1 tablet (1 mg total) by mouth at bedtime as needed for sleep. Take immediately before bedtime 15 tablet 1   gabapentin (NEURONTIN) 300 MG capsule Take 1-2 capsules (300-600 mg total) by mouth as directed. Take 1 capsule daily AM and 2 capsules daily PM 270 capsule 1   HYDROcodone-acetaminophen (NORCO/VICODIN) 5-325 MG tablet Take 1-2 tablets by mouth every 6 (six) hours as needed for moderate pain. 10 tablet 0   hydroxychloroquine (PLAQUENIL) 200 MG tablet Take 200 mg by mouth 2 (two) times daily.     levothyroxine (SYNTHROID, LEVOTHROID) 175 MCG tablet Take 1 tablet (175 mcg total) by mouth daily before breakfast. 90 tablet 3   Multiple Vitamins-Minerals (PRESERVISION AREDS) CAPS Take 1 capsule  by mouth daily.     omeprazole (PRILOSEC) 40 MG capsule Take 40 mg by mouth daily.     polyethylene glycol (MIRALAX / GLYCOLAX) 17 g packet Take 17 g by mouth daily. 14 each 0   solifenacin (VESICARE) 5 MG tablet Take 5 mg by mouth daily.     SUMAtriptan (IMITREX) 100 MG tablet Take 100 mg by mouth every 2 (two) hours as needed for migraine.     venlafaxine XR (EFFEXOR-XR) 37.5 MG 24 hr capsule Take 1 capsule (37.5 mg total) by mouth daily with breakfast. 30 capsule 1   vitamin E 45 MG (100 UNITS) capsule Take 100 Units by mouth daily.     No current facility-administered medications for this visit.     Musculoskeletal: Strength & Muscle Tone: within normal limits Gait & Station: normal Patient leans: N/A  Psychiatric Specialty Exam: Review of Systems  Psychiatric/Behavioral:  Positive for decreased concentration. The patient is nervous/anxious.   All other systems reviewed and are negative.  Blood pressure 118/81, pulse 80, temperature 98.3 F (36.8 C), temperature source Temporal, weight 163 lb 6.4 oz (74.1 kg).Body mass index is 25.59 kg/m.  General Appearance: Casual  Eye Contact:  Fair  Speech:  Normal Rate  Volume:  Normal  Mood:  Anxious  Affect:  Congruent  Thought Process:  Goal Directed and Descriptions of Associations: Intact  Orientation:  Full (Time, Place, and Person)  Thought Content: Rumination   Suicidal Thoughts:  No  Homicidal Thoughts:  No  Memory:  Immediate;   Fair Recent;   Fair Remote;   limited  Judgement:  Fair  Insight:  Fair  Psychomotor Activity:  Restlessness  Concentration:  Concentration: Fair and Attention Span: Fair  Recall:  [employer] of Knowledge: Fair  Language: Fair  Akathisia:  No  Handed:  Right  AIMS (if indicated): done, 0  Assets:  Communication Skills Housing Intimacy Social Support  ADL's:  Intact  Cognition: baseline  Sleep:  Fair   Screenings: [place] Office Visit from 06/15/2018 in [facility] Office Visit from 05/20/2018 in [facility] Total Score 5 9      GAD-7  [place] Office Visit from 03/27/2021 in [facility] from 03/21/2021 in [facility] Video Visit from 03/13/2021 in [place] Video Visit from 09/21/2020 in [place] Video Visit from 07/26/2020 in [facility]  Total GAD-7 Score 17 21 15 12 8       [place] Office Visit from 01/01/2016 in [facility] Office Visit from 11/13/2015 in [facility] Office Visit from 10/26/2015 in [facility] Office Visit from 04/25/2015 in [facility] Office Visit from 10/25/2014 in [facility]  Total Score (max 30 points ) 25 26 21 23 29       PHQ2-9    [place] Visit from 03/27/2021 in [facility] Counselor from 03/21/2021 in [facility] Video Visit from 03/13/2021 in [facility] Office Visit from 01/30/2021 in [facility] Video Visit from 09/21/2020 in [facility]  PHQ-2 Total Score 3 6 4 2 2   PHQ-9 Total Score 9 20 12 13 12       [place] Office Visit from 03/27/2021 in [facility] Video Visit from 03/13/2021 in [facility] Counselor from 01/31/2021 in [facility] No Risk No Risk No Risk        Assessment and Plan: Kentley Cedillo is a 61 year old Caucasian female who has a history of bipolar disorder type II, neuroleptic induced Parkinson's disease, insomnia, cognitive disorder, Sjogren's syndrome, hypothyroidism, fibromyalgia was evaluated in  office today.  Patient with worsening anxiety, will benefit from the following plan.  Plan Bipolar disorder in partial remission Gabapentin 300 mg p.o. daily in the morning and 600 mg p.o. nightly Discontinue Rexulti since she has been noncompliant due to cost.   GAD-unstable Start venlafaxine 37.5 mg p.o. daily with breakfast Continue BuSpar 20 mg p.o. twice daily Klonopin 0.5 mg as needed for severe anxiety attacks Refer for intensive outpatient program/PHP-provided information for [place], [place]. Continue CBT  Primary insomnia-stable Lunesta 1 mg p.o. nightly-rarely takes it.   Mild neurocognitive disorder-stable Continue Aricept as prescribed by neurology.  Coordinated care with Ms. [name].   The genesight testing results were reviewed while making medication changes.  Follow-up in clinic in 2 weeks or sooner in person.  This note was generated in part or whole with voice recognition software. Voice recognition is usually quite accurate but there are transcription errors that can and very often do occur. I apologize for any typographical errors that were not detected and corrected.     Ursula Alert, MD 03/28/2021, 12:34 PM

## 2021-04-03 ENCOUNTER — Ambulatory Visit: Payer: Medicare HMO | Admitting: Psychiatry

## 2021-04-12 ENCOUNTER — Other Ambulatory Visit: Payer: Self-pay

## 2021-04-12 ENCOUNTER — Ambulatory Visit (INDEPENDENT_AMBULATORY_CARE_PROVIDER_SITE_OTHER): Payer: 59 | Admitting: Licensed Clinical Social Worker

## 2021-04-12 DIAGNOSIS — F3181 Bipolar II disorder: Secondary | ICD-10-CM

## 2021-04-12 DIAGNOSIS — F411 Generalized anxiety disorder: Secondary | ICD-10-CM | POA: Diagnosis not present

## 2021-04-12 NOTE — Progress Notes (Signed)
° °  THERAPIST PROGRESS NOTE  Session Time: [id number]  Participation Level: Active  Behavioral Response: Neat and Well GroomedAlertAnxious and Depressed  Type of Therapy: Individual Therapy  Treatment Goals addressed:    Problem: Bipolar Disorder CCP Problem  1 Alleviate depressive/manic symptoms and return to improved levels of effective functioning per self report 3 out of 5 sessions documented Goal: LTG: Stabilize mood and increase goal-directed behavior: Input needed on appropriate metric Outcome: Progressing Goal: STG: Reduce frequency, intensity, and duration of depression symptoms as evidenced by pt self report 3 out of 5 sessions documented Outcome: Progressing  ProgressTowards Goals: Progressing  Interventions: CBT, DBT, and Supportive  Summary: Stacy [name] is a 62 y.o. female who presents with improving symptoms related to mood disorder. Pt reports that mood continues to fluctuate, but pt feels like its slowly improving. Pt reports inconsistent quality and quantity of sleep.              Allowed pt to explore and express thoughts and feelings associated with recent life situations and external stressors. Discussed stress associated with pts husband and his beliefs/thoughts/feelings. Pt reports that she is making intentional decisions to stay away from the news since it it is an identifiable trigger for her.   Pt reports that she would like to attent [facility] but needs to complete ROI.  Office staff think that pt has ROI already--are looking for it. If not found by Monday, pt to come into the office to complete new ROI.                                     Continued recommendations are as follows: self care behaviors, positive social engagements, focusing on overall work/home/life balance, and focusing on positive physical and emotional wellness.  .   Suicidal/Homicidal: No  Therapist Response: Pt is continuing to apply interventions  learned in session into daily life situations. Pt is currently on track to meet goals utilizing interventions mentioned above. Personal growth and progress noted. Treatment to continue as indicated.   Plan: Return again in 4 weeks.  Diagnosis: Bipolar 2 disorder (HCC)  GAD (generalized anxiety disorder)  Collaboration of Care: Community Stakeholder(s) AEB Pt requested in-person  and Other Pt encouraged to maintain medication management appointments with psychiatric provider  Patient/Guardian was advised Release of Information must be obtained prior to any record release in order to collaborate their care with an outside provider. Patient/Guardian was advised if they have not already done so to contact the registration department to sign all necessary forms in order for [place] to release information regarding their care.   Consent: Patient/Guardian gives verbal consent for treatment and assignment of benefits for services provided during this visit. Patient/Guardian expressed understanding and agreed to proceed.   [place], LCSW 04/13/2021

## 2021-04-13 NOTE — Plan of Care (Signed)
°  Problem: Bipolar Disorder CCP Problem  1 Alleviate depressive/manic symptoms and return to improved levels of effective functioning per self report 3 out of 5 sessions documented Goal: LTG: Stabilize mood and increase goal-directed behavior: Input needed on appropriate metric Outcome: Progressing Goal: STG: Reduce frequency, intensity, and duration of depression symptoms as evidenced by pt self report 3 out of 5 sessions documented Outcome: Progressing Intervention: Identify necessary consults and/or referrals Description: [place] [facility]--[place] Intervention: Encourage verbalization of feelings/concerns/expectations Intervention: REVIEW PLEASE SKILLS (TREAT PHYSICAL ILLNESS, BALANCE EATING, AVOID MOOD-ALTERING SUBSTANCES, BALANCE SLEEP AND GET EXERCISE) WITH Joelene Millin Description: Pt doing a great job of incorporating these activities into daily schedule

## 2021-04-16 ENCOUNTER — Encounter: Payer: Self-pay | Admitting: Psychiatry

## 2021-04-16 ENCOUNTER — Ambulatory Visit (INDEPENDENT_AMBULATORY_CARE_PROVIDER_SITE_OTHER): Payer: 59 | Admitting: Psychiatry

## 2021-04-16 ENCOUNTER — Other Ambulatory Visit: Payer: Self-pay

## 2021-04-16 VITALS — BP 111/74 | HR 78 | Temp 97.9°F | Wt 161.0 lb

## 2021-04-16 DIAGNOSIS — F02A18 Dementia in other diseases classified elsewhere, mild, with other behavioral disturbance: Secondary | ICD-10-CM | POA: Diagnosis not present

## 2021-04-16 DIAGNOSIS — F3176 Bipolar disorder, in full remission, most recent episode depressed: Secondary | ICD-10-CM | POA: Diagnosis not present

## 2021-04-16 DIAGNOSIS — F5101 Primary insomnia: Secondary | ICD-10-CM | POA: Diagnosis not present

## 2021-04-16 DIAGNOSIS — F411 Generalized anxiety disorder: Secondary | ICD-10-CM | POA: Diagnosis not present

## 2021-04-16 NOTE — Progress Notes (Signed)
[facility] MD OP Progress Note  04/17/2021 9:02 AM Stacy [name]  MRN:  [id number]  Chief Complaint:  Chief Complaint  Patient presents with   Follow-up: 61 year old Caucasian female who has a history of bipolar disorder, GAD, major neurocognitive disorder, primary insomnia was evaluated for medication management.   HPI: Stacy [name] is a 61 year old Caucasian female, unemployed, married, lives in [place], has a history of bipolar disorder, GAD, insomnia, mild cognitive disorder, neuroleptic induced Parkinson's, Sjogren's syndrome, hypertension, hypothyroidism, arthritis, migraine headaches was evaluated in office today.  Patient today returns reporting she is tolerating the Effexor 37.5 mg well.  Has not had any side effects.  Continues to have anxiety symptoms.  Continues to worry about something bad going to happen in the world and is preoccupied with buying groceries and storing it at home.Hoarding behavior.  The current world news does bother her.  Currently following up with her therapist Ms. Stacy [name] who is trying to connect her with support groups.  Patient reports she was referred for a woman's wellness group in [place].  This is going to be every Wednesday and looks forward to that.  She was referred for IOP/PHP however did not follow through with the recommendation due to these groups being virtual per patient report.  Patient does also struggle with memory problems.  Reports she does have trouble with names.  Has been keeping checklist, reminders which has been very beneficial.  Her husband supports her.  He cooks and she helps.  She does not have any trouble driving as long as she uses the GPS.  Denies suicidality, homicidality or perceptual disturbances.  Denies any other concerns today.  Visit Diagnosis:    ICD-10-CM   1. Bipolar disorder, in full remission, most recent episode depressed ([place])  F31.76    Type 2    2. GAD (generalized anxiety disorder)  F41.1      3. 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 Primary insomnia  F51.01       Past Psychiatric History: Reviewed past psychiatric history from progress note on 12/11/2017.  Past trials of Paxil, Wellbutrin, risperidone, Latuda, Prolixin, Seroquel, Abilify, Zyprexa, Invega, Topamax, Lexapro, Belsomra, Depakote, Rexulti-could not afford.  Past Medical History:  Past Medical History:  Diagnosis Date   Aneurysm ([place])    Anxiety    Arthritis    Brain stem lesion    Chronic kidney disease    kideny stone   Depression    Family history of adverse reaction to anesthesia    mother has N/V   Fibromyalgia    GERD (gastroesophageal reflux disease)    Headache    Hypotension    Hypothyroid    Memory loss    Migraine    Raynaud disease    Sjogren's syndrome ([place])    Thyroid disease     Past Surgical History:  Procedure Laterality Date   ABDOMINAL HYSTERECTOMY     BASAL CELL CARCINOMA EXCISION     BREAST BIOPSY Left    CESAREAN SECTION     1982, 1984   CYSTOSCOPY WITH STENT PLACEMENT Bilateral 08/01/2020   Procedure: CYSTOSCOPY WITH STENT PLACEMENT;  Surgeon: Hollice Espy, MD;  Location: [facility] OR[facility];  Service: Urology;  Laterality: Bilateral;   CYSTOSCOPY/URETEROSCOPY/HOLMIUM LASER/STENT PLACEMENT Bilateral 08/14/2020   Procedure: CYSTOSCOPY/URETEROSCOPY/HOLMIUM LASER/STENT PLACEMENT;  Surgeon: Hollice Espy, MD;  Location: [facility] OR[facility];  Service: Urology;  Laterality: Bilateral;   EYE SURGERY Right    cataract  KNEE SURGERY Left    torn meniscus   LITHOTRIPSY     MUSCLE BIOPSY     OOPHORECTOMY      Family Psychiatric History: Reviewed family psychiatric history from progress note on 12/11/2017.  Family History:  Family History  Problem Relation Age of Onset   Migraines Mother    Anxiety disorder Mother    Depression Mother    Cancer Father    Hypertension Father    Stroke Father    Heart attack Father     Alcohol abuse Father    Diabetes Brother    Alcohol abuse Sister    Drug abuse Sister    Anxiety disorder Sister    Depression Sister    Drug abuse Brother    Cancer Paternal Aunt    Lupus Paternal Aunt    Thyroid disease Paternal Aunt    Diabetes Maternal Grandfather    Anxiety disorder Maternal Grandmother    Depression Maternal Grandmother     Social History: Reviewed social history from progress note on 12/11/2017. Social History   Socioeconomic History   Marital status: Married    Spouse name: Stacy [name]   Number of children: 2   Years of education: 14   Highest education level: Associate degree: occupational, Hotel manager, or vocational program  Occupational History   Occupation: Disabled  Tobacco Use   Smoking status: Never   Smokeless tobacco: Never  Vaping Use   Vaping Use: Never used  Substance and Sexual Activity   Alcohol use: No    Alcohol/week: 0.0 standard drinks   Drug use: Never   Sexual activity: Yes    Birth control/protection: Surgical  Other Topics Concern   Not on file  Social History Narrative   Lives at home with husband. Stacy [name]).    Disabled.   Education college.   Right handed.   2 cups caffeine/daily   Social Determinants of Health   Financial Resource Strain: Not on file  Food Insecurity: Not on file  Transportation Needs: Not on file  Physical Activity: Not on file  Stress: Not on file  Social Connections: Not on file    Allergies:  Allergies  Allergen Reactions   Penicillins Hives   Amoxicillin Hives   Depakote [Divalproex Sodium]     vomiting   Lamictal [Lamotrigine]     Side effects of abdominal cramps, nausea , diarrhea ,nightmares    Metabolic Disorder Labs: Lab Results  Component Value Date   HGBA1C 5.0 11/13/2015   No results found for: PROLACTIN No results found for: CHOL, TRIG, HDL, CHOLHDL, VLDL, LDLCALC Lab Results  Component Value Date   TSH 1.780 10/26/2015   TSH 0.525 03/15/2015    Therapeutic Level  Labs: No results found for: LITHIUM Lab Results  Component Value Date   VALPROATE 10 (L) 07/28/2019   No components found for:  CBMZ  Current Medications: Current Outpatient Medications  Medication Sig Dispense Refill   alendronate (FOSAMAX) 70 MG tablet 1 tablet 30 minutes before the first food, beverage or medicine of the day dissolved in 4 ounces of water     Ascorbic Acid (VITAMIN C) 100 MG tablet Take 100 mg by mouth daily.     busPIRone (BUSPAR) 10 MG tablet Take 2 tablets (20 mg total) by mouth 2 (two) times daily. 120 tablet 1   cevimeline (EVOXAC) 30 MG capsule Take 30 mg by mouth 3 (three) times daily.     Cholecalciferol (VITAMIN D-3) 1000 units CAPS Take 1,000 Units by mouth  daily.     clonazePAM (KLONOPIN) 0.5 MG tablet Take 1 tablet (0.5 mg total) by mouth daily as needed for anxiety. AS NEEDED FOR SEVERE ANXIETY ATTACKS 15 tablet 2   donepezil (ARICEPT) 10 MG tablet Take 10 mg by mouth every morning.     eszopiclone (LUNESTA) 1 MG TABS tablet Take 1 tablet (1 mg total) by mouth at bedtime as needed for sleep. Take immediately before bedtime 15 tablet 1   gabapentin (NEURONTIN) 300 MG capsule Take 1-2 capsules (300-600 mg total) by mouth as directed. Take 1 capsule daily AM and 2 capsules daily PM 270 capsule 1   HYDROcodone-acetaminophen (NORCO/VICODIN) 5-325 MG tablet Take 1-2 tablets by mouth every 6 (six) hours as needed for moderate pain. 10 tablet 0   hydroxychloroquine (PLAQUENIL) 200 MG tablet Take 200 mg by mouth 2 (two) times daily.     levothyroxine (SYNTHROID, LEVOTHROID) 175 MCG tablet Take 1 tablet (175 mcg total) by mouth daily before breakfast. 90 tablet 3   Multiple Vitamins-Minerals (PRESERVISION AREDS) CAPS Take 1 capsule by mouth daily.     omeprazole (PRILOSEC) 40 MG capsule Take 40 mg by mouth daily.     polyethylene glycol (MIRALAX / GLYCOLAX) 17 g packet Take 17 g by mouth daily. 14 each 0   solifenacin (VESICARE) 5 MG tablet Take 5 mg by mouth daily.      SUMAtriptan (IMITREX) 100 MG tablet Take 100 mg by mouth every 2 (two) hours as needed for migraine.     venlafaxine XR (EFFEXOR-XR) 37.5 MG 24 hr capsule Take 1 capsule (37.5 mg total) by mouth daily with breakfast. 30 capsule 1   vitamin E 45 MG (100 UNITS) capsule Take 100 Units by mouth daily.     cyclobenzaprine (FLEXERIL) 5 MG tablet Take 5 mg by mouth 3 (three) times daily as needed.     predniSONE (DELTASONE) 10 MG tablet Take 10 mg by mouth 2 (two) times daily.     No current facility-administered medications for this visit.     Musculoskeletal: Strength & Muscle Tone: within normal limits Gait & Station: normal Patient leans: N/A  Psychiatric Specialty Exam: Review of Systems  Psychiatric/Behavioral:  The patient is nervous/anxious.   All other systems reviewed and are negative.  Blood pressure 111/74, pulse 78, temperature 97.9 F (36.6 C), temperature source Temporal, weight 161 lb (73 kg).Body mass index is 25.22 kg/m.  General Appearance: Casual  Eye Contact:  Fair  Speech:  Clear and Coherent  Volume:  Normal  Mood:  Anxious  Affect:  Congruent  Thought Process:  Goal Directed and Descriptions of Associations: Intact  Orientation:  Full (Time, Place, and Person)  Thought Content: Rumination   Suicidal Thoughts:  No  Homicidal Thoughts:  No  Memory:  Immediate;   Fair Recent;   Fair Remote;   limited , does report short term memory loss  Judgement:  Fair  Insight:  Fair  Psychomotor Activity:  Normal  Concentration:  Concentration: Fair and Attention Span: Fair  Recall:  [employer] of Knowledge: Fair  Language: Fair  Akathisia:  No  Handed:  Right  AIMS (if indicated): not done  Assets:  Communication Skills Desire for Improvement Housing Social Support  ADL's:  Intact  Cognition: limited-mild  Sleep:  Fair   Screenings: [place] Office Visit from 06/15/2018 in [facility] Office Visit from 05/20/2018 in  [place] Total Score 5 9  [place] Office Visit from 04/16/2021 in [facility] Office Visit from 03/27/2021 in [place] from 03/21/2021 in [place] Video Visit from 03/13/2021 in [facility] Video Visit from 09/21/2020 in [facility]  Total GAD-7 Score 14 17 21 15 12       [place] Office Visit from 01/01/2016 in [facility] Office Visit from 11/13/2015 in [facility] Office Visit from 10/26/2015 in [facility] Office Visit from 04/25/2015 in [facility] Office Visit from 10/25/2014 in [facility]  Total Score (max 30 points ) 25 26 21 23 29       PHQ2-9    [place] Visit from 04/16/2021 in [facility] Counselor from 04/12/2021 in [facility] from 03/27/2021 in [facility] from 03/21/2021 in [place] Video Visit from 03/13/2021 in [facility]  PHQ-2 Total Score 2 3 3 6 4   PHQ-9 Total Score 5 12 9 20 12       [place] Office Visit from 04/16/2021 in [facility] Counselor from 04/12/2021 in [facility] Office Visit from 03/27/2021 in [place] No Risk No Risk No Risk        Assessment and Plan: Richa Shor is a 61 year old Caucasian female who has a history of bipolar disorder type II, neuroleptic induced Parkinson's disease, insomnia, cognitive disorder, Sjogren's syndrome, hypothyroidism, fibromyalgia was evaluated in office today.  Patient is currently tolerating the venlafaxine  well although she continues to have anxiety.  Plan as noted below.  Plan Bipolar disorder in remission Gabapentin 300 mg p.o. daily in the morning and 600 mg p.o. nightly.  GAD-unstable Venlafaxine 37.5 mg p.o. daily with breakfast. We will consider increasing the dosage in future sessions. BuSpar 20 mg p.o. twice daily Klonopin 0.5 mg as needed for severe anxiety attacks, patient encouraged to use it more frequently.  Patient advised to use it prior to going to the grocery store or group activities. Patient has been referred for groups per therapist-pending.  Encouraged her to participate.  Primary insomnia-stable Lunesta 1 mg p.o. nightly-rarely takes it.  Mild neurocognitive disorder-stable Continue Aricept  per neurology  Follow-up in clinic in 3-4 weeks or sooner if needed.   Collaboration of Care: Collaboration of Care: Referral or follow-up with counselor/therapist AEB encouraged to continue follow up with therapist as well as participating in groups-referred to groups in [place].  Patient/Guardian was advised Release of Information must be obtained prior to any record release in order to collaborate their care with an outside provider. Patient/Guardian was advised if they have not already done so to contact the registration department to sign all necessary forms in order for [place] to release information regarding their care.   Consent: Patient/Guardian gives verbal consent for treatment and assignment of benefits for services provided during this visit. Patient/Guardian expressed understanding and agreed to proceed.   This note was generated in part or whole with voice recognition software. Voice recognition is usually quite accurate but there are transcription errors that can and very often do occur. I apologize for any typographical errors that were not detected and corrected.      Ursula Alert, MD 04/17/2021, 9:02 AM

## 2021-05-05 ENCOUNTER — Other Ambulatory Visit: Payer: Self-pay | Admitting: Psychiatry

## 2021-05-05 DIAGNOSIS — F411 Generalized anxiety disorder: Secondary | ICD-10-CM

## 2021-05-07 ENCOUNTER — Ambulatory Visit (INDEPENDENT_AMBULATORY_CARE_PROVIDER_SITE_OTHER): Payer: 59 | Admitting: Psychiatry

## 2021-05-07 ENCOUNTER — Other Ambulatory Visit: Payer: Self-pay

## 2021-05-07 ENCOUNTER — Encounter: Payer: Self-pay | Admitting: Psychiatry

## 2021-05-07 VITALS — BP 113/77 | HR 83 | Temp 98.7°F | Wt 159.2 lb

## 2021-05-07 DIAGNOSIS — F411 Generalized anxiety disorder: Secondary | ICD-10-CM | POA: Diagnosis not present

## 2021-05-07 DIAGNOSIS — F5101 Primary insomnia: Secondary | ICD-10-CM | POA: Diagnosis not present

## 2021-05-07 DIAGNOSIS — F02A18 Dementia in other diseases classified elsewhere, mild, with other behavioral disturbance: Secondary | ICD-10-CM

## 2021-05-07 DIAGNOSIS — F3176 Bipolar disorder, in full remission, most recent episode depressed: Secondary | ICD-10-CM | POA: Diagnosis not present

## 2021-05-07 MED ORDER — BUSPIRONE HCL 10 MG PO TABS: 20.0000 mg | ORAL_TABLET | Freq: Two times a day (BID) | ORAL | 1 refills | Status: DC

## 2021-05-07 MED ORDER — VENLAFAXINE HCL ER 75 MG PO CP24: 75.0000 mg | ORAL_CAPSULE | Freq: Every day | ORAL | 1 refills | Status: DC

## 2021-05-07 NOTE — Progress Notes (Unsigned)
[place] MD OP Progress Note  05/07/2021 2:55 PM Stacy [name]  MRN:  [id number]  Chief Complaint:  Chief Complaint  Patient presents with   Follow-up: 61 year old Caucasian female who has a history of bipolar disorder, GAD, major neurocognitive disorder, primary insomnia was evaluated for medication management.   HPI: Stacy [name] is a 61 year old Caucasian female, unemployed, married, lives in [place], has a history of bipolar disorder, GAD, insomnia, mild neurocognitive disorder, neuroleptic induced Parkinson's, Sjogren's syndrome, hypertension, hypothyroidism, arthritis, migraine headache was evaluated in office today.  Patient today reports since being on the venlafaxine she has noticed her anxiety symptoms is getting better.  She reports she however continues to have hoarding behaviors although it may be getting better to some extent.  She denies any side effects to the venlafaxine and is interested in dosage increase.  She continues to follow-up with her therapist and has upcoming appointment.  Patient denies any sadness or crying spells.  She reports sleep is good.  Denies any suicidality, homicidality or perceptual disturbances.  Patient reports her memory problems are currently stable, has not noticed any worsening.  She continues to have good support system from her husband.    Visit Diagnosis:    ICD-10-CM   1. Bipolar disorder, in full remission, most recent episode depressed (HCC)  F31.76 venlafaxine XR (EFFEXOR-XR) 75 MG 24 hr capsule   type 2    2. GAD (generalized anxiety disorder)  F41.1 busPIRone (BUSPAR) 10 MG tablet    3. 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 Primary insomnia  F51.01       Past Psychiatric History: Reviewed past psychiatric history from progress note on 12/11/2017.  Past trials of Paxil, Wellbutrin, risperidone, Latuda, Prolixin, Seroquel, Abilify, Zyprexa, Invega, Topamax,  Lexapro, Belsomra, Depakote, Rexulti-could not afford.  Past Medical History:  Past Medical History:  Diagnosis Date   Aneurysm ([place])    Anxiety    Arthritis    Brain stem lesion    Chronic kidney disease    kideny stone   Depression    Family history of adverse reaction to anesthesia    mother has N/V   Fibromyalgia    GERD (gastroesophageal reflux disease)    Headache    Hypotension    Hypothyroid    Memory loss    Migraine    Raynaud disease    Sjogren's syndrome ([place])    Thyroid disease     Past Surgical History:  Procedure Laterality Date   ABDOMINAL HYSTERECTOMY     BASAL CELL CARCINOMA EXCISION     BREAST BIOPSY Left    CESAREAN SECTION     1982, 1984   CYSTOSCOPY WITH STENT PLACEMENT Bilateral 08/01/2020   Procedure: CYSTOSCOPY WITH STENT PLACEMENT;  Surgeon: Hollice Espy, MD;  Location: [facility] OR[facility];  Service: Urology;  Laterality: Bilateral;   CYSTOSCOPY/URETEROSCOPY/HOLMIUM LASER/STENT PLACEMENT Bilateral 08/14/2020   Procedure: CYSTOSCOPY/URETEROSCOPY/HOLMIUM LASER/STENT PLACEMENT;  Surgeon: Hollice Espy, MD;  Location: [facility];  Service: Urology;  Laterality: Bilateral;   EYE SURGERY Right    cataract   KNEE SURGERY Left    torn meniscus   LITHOTRIPSY     MUSCLE BIOPSY     OOPHORECTOMY      Family Psychiatric History: Reviewed family psychiatric history from progress note on 12/11/2017.  Family History:  Family History  Problem Relation Age of Onset   Migraines Mother    Anxiety disorder Mother    Depression Mother  Cancer Father    Hypertension Father    Stroke Father    Heart attack Father    Alcohol abuse Father    Diabetes Brother    Alcohol abuse Sister    Drug abuse Sister    Anxiety disorder Sister    Depression Sister    Drug abuse Brother    Cancer Paternal Aunt    Lupus Paternal Aunt    Thyroid disease Paternal Aunt    Diabetes Maternal Grandfather    Anxiety disorder Maternal Grandmother    Depression Maternal  Grandmother     Social History: Reviewed social history from progress note on 12/11/2017. Social History   Socioeconomic History   Marital status: Married    Spouse name: [name]   Number of children: 2   Years of education: 14   Highest education level: Associate degree: occupational, Hotel manager, or vocational program  Occupational History   Occupation: Disabled  Tobacco Use   Smoking status: Never   Smokeless tobacco: Never  Vaping Use   Vaping Use: Never used  Substance and Sexual Activity   Alcohol use: No    Alcohol/week: 0.0 standard drinks   Drug use: Never   Sexual activity: Yes    Birth control/protection: Surgical  Other Topics Concern   Not on file  Social History Narrative   Lives at home with husband. [name]).    Disabled.   Education college.   Right handed.   2 cups caffeine/daily   Social Determinants of Health   Financial Resource Strain: Not on file  Food Insecurity: Not on file  Transportation Needs: Not on file  Physical Activity: Not on file  Stress: Not on file  Social Connections: Not on file    Allergies:  Allergies  Allergen Reactions   Penicillins Hives   Amoxicillin Hives   Depakote [Divalproex Sodium]     vomiting   Lamictal [Lamotrigine]     Side effects of abdominal cramps, nausea , diarrhea ,nightmares    Metabolic Disorder Labs: Lab Results  Component Value Date   HGBA1C 5.0 11/13/2015   No results found for: PROLACTIN No results found for: CHOL, TRIG, HDL, CHOLHDL, VLDL, LDLCALC Lab Results  Component Value Date   TSH 1.780 10/26/2015   TSH 0.525 03/15/2015    Therapeutic Level Labs: No results found for: LITHIUM Lab Results  Component Value Date   VALPROATE 10 (L) 07/28/2019   No components found for:  CBMZ  Current Medications: Current Outpatient Medications  Medication Sig Dispense Refill   alendronate (FOSAMAX) 70 MG tablet 1 tablet 30 minutes before the first food, beverage or medicine of the day  dissolved in 4 ounces of water     Ascorbic Acid (VITAMIN C) 100 MG tablet Take 100 mg by mouth daily.     cevimeline (EVOXAC) 30 MG capsule Take 30 mg by mouth 3 (three) times daily.     Cholecalciferol (VITAMIN D-3) 1000 units CAPS Take 1,000 Units by mouth daily.     clonazePAM (KLONOPIN) 0.5 MG tablet Take 1 tablet (0.5 mg total) by mouth daily as needed for anxiety. AS NEEDED FOR SEVERE ANXIETY ATTACKS 15 tablet 2   cyclobenzaprine (FLEXERIL) 5 MG tablet Take 5 mg by mouth 3 (three) times daily as needed.     donepezil (ARICEPT) 10 MG tablet Take 10 mg by mouth every morning.     eszopiclone (LUNESTA) 1 MG TABS tablet Take 1 tablet (1 mg total) by mouth at bedtime as needed for sleep. Take  immediately before bedtime 15 tablet 1   gabapentin (NEURONTIN) 300 MG capsule Take 1-2 capsules (300-600 mg total) by mouth as directed. Take 1 capsule daily AM and 2 capsules daily PM 270 capsule 1   HYDROcodone-acetaminophen (NORCO/VICODIN) 5-325 MG tablet Take 1-2 tablets by mouth every 6 (six) hours as needed for moderate pain. 10 tablet 0   hydroxychloroquine (PLAQUENIL) 200 MG tablet Take 200 mg by mouth 2 (two) times daily.     levothyroxine (SYNTHROID, LEVOTHROID) 175 MCG tablet Take 1 tablet (175 mcg total) by mouth daily before breakfast. 90 tablet 3   Multiple Vitamins-Minerals (PRESERVISION AREDS) CAPS Take 1 capsule by mouth daily.     omeprazole (PRILOSEC) 40 MG capsule Take 40 mg by mouth daily.     polyethylene glycol (MIRALAX / GLYCOLAX) 17 g packet Take 17 g by mouth daily. 14 each 0   predniSONE (DELTASONE) 10 MG tablet Take 10 mg by mouth 2 (two) times daily.     solifenacin (VESICARE) 5 MG tablet Take 5 mg by mouth daily.     SUMAtriptan (IMITREX) 100 MG tablet Take 100 mg by mouth every 2 (two) hours as needed for migraine.     venlafaxine XR (EFFEXOR-XR) 75 MG 24 hr capsule Take 1 capsule (75 mg total) by mouth daily with breakfast. 30 capsule 1   vitamin E 45 MG (100 UNITS) capsule  Take 100 Units by mouth daily.     busPIRone (BUSPAR) 10 MG tablet Take 2 tablets (20 mg total) by mouth 2 (two) times daily. 120 tablet 1   No current facility-administered medications for this visit.     Musculoskeletal: Strength & Muscle Tone: within normal limits Gait & Station: normal Patient leans: N/A  Psychiatric Specialty Exam: Review of Systems  Psychiatric/Behavioral:  The patient is nervous/anxious.   All other systems reviewed and are negative.  Blood pressure 113/77, pulse 83, temperature 98.7 F (37.1 C), temperature source Temporal, weight 159 lb 3.2 oz (72.2 kg).Body mass index is 24.93 kg/m.  General Appearance: Casual  Eye Contact:  Fair  Speech:  Clear and Coherent  Volume:  Normal  Mood:  Anxious  Affect:  Congruent  Thought Process:  Goal Directed and Descriptions of Associations: Intact  Orientation:  Full (Time, Place, and Person)  Thought Content: Logical   Suicidal Thoughts:  No  Homicidal Thoughts:  No  Memory:  Immediate;   Fair Recent;   Fair Remote;   Fair  Judgement:  Fair  Insight:  Fair  Psychomotor Activity:  Normal  Concentration:  Concentration: Fair and Attention Span: Fair  Recall:  [employer] of Knowledge: Fair  Language: Fair  Akathisia:  No  Handed:  Right  AIMS (if indicated): not done  Assets:  Communication Skills Desire for Improvement Housing Social Support  ADL's:  Intact  Cognition: WNL  Sleep:  Fair   Screenings: [place] Office Visit from 06/15/2018 in [place] Office Visit from 05/20/2018 in [place] Total Score 5 9      [place] Visit from 05/07/2021 in [place] Visit from 04/16/2021 in [place] Office Visit from 03/27/2021 in [facility] [place] from 03/21/2021 in [place]  Video Visit from 03/13/2021 in [facility]  Total GAD-7 Score '9 14 17 21 15      '$ [place]  Office Visit from 01/01/2016 in [facility] Office Visit from 11/13/2015 in [facility] Office Visit from 10/26/2015 in [facility] Office Visit from 04/25/2015 in [facility] Office Visit from 10/25/2014 in [facility]  Total Score (max 30 points ) '25 26 21 23 29      '$ PHQ2-9    [place] Office Visit from 05/07/2021 in [place] Office Visit from 04/16/2021 in [place] Counselor from 04/12/2021 in [place] Office Visit from 03/27/2021 in [place] from 03/21/2021 in [place]  PHQ-2 Total Score '2 2 3 3 6  '$ PHQ-9 Total Score '6 5 12 9 20      '$ [place] Office Visit from 05/07/2021 in [place] Office Visit from 04/16/2021 in [place] Counselor from 04/12/2021 in [place] No Risk No Risk No Risk        Assessment and Plan: Stacy [name] is a 61 year old Caucasian female who has a history of bipolar disorder type II, neuroleptic induced Parkinson's disease, insomnia, cognitive disorder, Sjogren's syndrome, hypothyroidism, fibromyalgia was evaluated in office today.  Patient is currently improving on the venlafaxine, will benefit from the following plan.  Plan Bipolar disorder in remission Gabapentin 300 mg p.o. daily in the morning and 600 mg p.o. nightly.  GAD-improving Increase venlafaxine to 75 mg p.o. daily with breakfast BuSpar 20 mg p.o. twice daily. Klonopin 0.5 mg as needed for severe anxiety attacks.  She has been limiting use. Continue CBT with Ms. Christina  Hussami  Primary insomnia-stable Lunesta 1 mg p.o. nightly-rarely takes it.  Mild neurocognitive disorder-stable Continue Aricept per neurology.  Follow-up in clinic in 4 to 6 weeks or sooner if needed.  Collaboration of Care: Collaboration of Care: Referral or follow-up with counselor/therapist AEB encouraged to follow up with therapist, I have communicated with her therapist, patient also encouraged to follow up with the women's support group.  Patient/Guardian was advised Release of Information must be obtained prior to any record release in order to collaborate their care with an outside provider. Patient/Guardian was advised if they have not already done so to contact the registration department to sign all necessary forms in order for [place] to release information regarding their care.   Consent: Patient/Guardian gives verbal consent for treatment and assignment of benefits for services provided during this visit. Patient/Guardian expressed understanding and agreed to proceed.   This note was generated in part or whole with voice recognition software. Voice recognition is usually quite accurate but there are transcription errors that can and very often do occur. I apologize for any typographical errors that were not detected and corrected.      Ursula Alert, MD 05/07/2021, 2:55 PM

## 2021-05-09 ENCOUNTER — Encounter: Payer: Self-pay | Admitting: Licensed Clinical Social Worker

## 2021-05-24 ENCOUNTER — Ambulatory Visit (INDEPENDENT_AMBULATORY_CARE_PROVIDER_SITE_OTHER): Payer: 59 | Admitting: Licensed Clinical Social Worker

## 2021-05-24 DIAGNOSIS — F3175 Bipolar disorder, in partial remission, most recent episode depressed: Secondary | ICD-10-CM

## 2021-05-24 NOTE — Progress Notes (Signed)
? ?  THERAPIST PROGRESS NOTE ? ?Session Time: 1-140p ? ?Participation Level: Active ? ?Behavioral Response: Neat and Well GroomedAlertAnxious and Depressed ? ?Type of Therapy: Individual Therapy ? ?Treatment Goals addressed:  ?  ?Problem: Bipolar Disorder CCP Problem  1 Alleviate depressive/manic symptoms and return to improved levels of effective functioning per self report 3 out of 5 sessions documented ?Goal: LTG: Stabilize mood and increase goal-directed behavior: Input needed on appropriate metric ?Outcome: Progressing ? ?Goal: STG: Reduce frequency, intensity, and duration of depression symptoms as evidenced by pt self report 3 out of 5 sessions documented ?Outcome: Progressing ? ?ProgressTowards Goals: Progressing ? ?Interventions: CBT, DBT, and Supportive ? ?Summary: Stacy [name] is a 60 y.o. female who presents with  continuing symptoms related to mood disorder. Pt reports that she is continuing to feel more depressed than in the past, but is using her coping skills to manage thoughts/feelings.  ? ?Allowed pt to explore and express thoughts and feelings associated with recent life situations and external stressors. Patient reports that she is continuing to struggle with her thoughts and feelings associated with her marriage. Patient reports that she feels her husband is an alcoholic and how pt feels there is a significant psychological impact to that. Patient reports that she feels like she is not in control, and that she is constantly having to cater to her husband's needs. Clinician listened sympathetically to patients thoughts and feelings, then discussed ways of setting some boundaries so that patient feels like she has protected space. Patient reports that she enjoys going to the gym and working out, so clinician encouraged patient to go to the gym when she can. Reviewed other coping skills to help manage depression symptoms and anxiety symptoms.  ? ?Encouraged patient to reach back out to [employer] for additional information about support groups that patient is interested in attending. Clinician has already provided patient with contact information, has called the foundation and gotten additional information, and patient still has not followed through with setting up grapes. Patient states that nobody has called her to set up appointment to begin--clinician encouraged patient to reach out to the facility if she has not heard back. Patient reflects understanding and agrees. ? ?Continued recommendations are as follows: self care behaviors, positive social engagements, focusing on overall work/home/life balance, and focusing on positive physical and emotional wellness.  ? ? ?Suicidal/Homicidal: No ? ?Therapist Response: Pt is continuing to apply interventions learned in session into daily life situations. Pt is currently on track to meet goals utilizing interventions mentioned above. Personal growth and progress noted. Treatment to continue as indicated.  ? ?Encouraged pt to follow up about support groups at [facility]. Provided pt worksheets with contact information.  ? ?Plan: Return again in 4 weeks. ? ?Diagnosis: Bipolar disorder, in partial remission, most recent episode depressed ([place]) ? ?Collaboration of Care: Community Stakeholder(s) AEB Pt requested in-person  and Other Pt encouraged to maintain medication management appointments with psychiatric provider ? ?Patient/Guardian was advised Release of Information must be obtained prior to any record release in order to collaborate their care with an outside provider. Patient/Guardian was advised if they have not already done so to contact the registration department to sign all necessary forms in order for [place] to release information regarding their care.  ? ?Consent: Patient/Guardian gives verbal consent for treatment and assignment of benefits for services provided during this visit. Patient/Guardian expressed understanding and agreed to  proceed.  ? ?Sabra Sessler R Mayo Owczarzak, LCSW ?05/24/2021 ? ?

## 2021-05-25 NOTE — Plan of Care (Signed)
?  Problem: Bipolar Disorder CCP Problem  1 Alleviate depressive/manic symptoms and return to improved levels of effective functioning per self report 3 out of 5 sessions documented ?Goal: LTG: Stabilize mood and increase goal-directed behavior: Input needed on appropriate metric ?Outcome: Progressing ?Goal: STG: Reduce frequency, intensity, and duration of depression symptoms as evidenced by pt self report 3 out of 5 sessions documented ?Outcome: Progressing ?  ?

## 2021-06-14 ENCOUNTER — Ambulatory Visit: Payer: 59 | Admitting: Psychiatry

## 2021-07-10 ENCOUNTER — Encounter: Payer: Managed Care, Other (non HMO) | Attending: Psychology | Admitting: Psychology

## 2021-07-10 DIAGNOSIS — F411 Generalized anxiety disorder: Secondary | ICD-10-CM | POA: Diagnosis present

## 2021-07-10 DIAGNOSIS — F02A18 Dementia in other diseases classified elsewhere, mild, with other behavioral disturbance: Secondary | ICD-10-CM | POA: Diagnosis present

## 2021-07-10 DIAGNOSIS — F3181 Bipolar II disorder: Secondary | ICD-10-CM | POA: Diagnosis present

## 2021-07-10 NOTE — Progress Notes (Signed)
07/10/2021 10 AM-11 AM: ? ?Today's visit was an in person visit conducted in outpatient clinic office with the patient myself present.  Today we reviewed any changes that she has experienced since I last saw her in August 2022.  The patient reports that she is continuing to be followed by Dr. Shea Evans for psychiatric care and feels like this is going very well for her.  The patient has changed some medications including starting her on Effexor and BuSpar.  The patient reports that she feels like she is doing much better on this class of medications and her mood has improved and she has not been experiencing any of the significant burst of crying or tearfulness that she had before.  She reports that her mood has been much more stable.  As far as cognitive and neuropsychological changes the patient reports that things have been very stable over the past 9 months and she has not experienced any worsening of her symptoms with the exception of some potential mild increase in word finding difficulties but reports that this is not a clear change.  The patient reports that her information processing speed, motor functioning and executive functioning all have been quite stable for her. ? ?At this point, I do not think that we need to repeat the neuropsychological assessment as things appear to have been generally stable from that standpoint.  I will remain available if there are lacunar changes that develop but I do not think that we need to do repeat testing.  Below is a synopsis of the findings of the neuropsychological evaluation that was conducted last year and it can be found in its entirety in her EMR dated 10/03/2020: ? ?The pattern of overall subjective and objective findings are not consistent with conditions such as Alzheimer's dementia or Lewy body dementia.  Other cortical or subcortical dementias are also not consistent with current results.  I do think that the most likely explanation of her cognitive difficulties  and the course of these difficulties would be residual impacts of significant underlying psychiatric issues including bipolar disorder and significant depression along with some insomnia etc. ? 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 ?

## 2021-07-12 ENCOUNTER — Ambulatory Visit: Payer: 59 | Admitting: Licensed Clinical Social Worker

## 2021-07-30 ENCOUNTER — Ambulatory Visit (INDEPENDENT_AMBULATORY_CARE_PROVIDER_SITE_OTHER): Payer: 59 | Admitting: Psychiatry

## 2021-07-30 ENCOUNTER — Encounter: Payer: Self-pay | Admitting: Psychiatry

## 2021-07-30 VITALS — BP 107/72 | HR 89 | Temp 98.5°F | Wt 157.2 lb

## 2021-07-30 DIAGNOSIS — M479 Spondylosis, unspecified: Secondary | ICD-10-CM | POA: Insufficient documentation

## 2021-07-30 DIAGNOSIS — F09 Unspecified mental disorder due to known physiological condition: Secondary | ICD-10-CM

## 2021-07-30 DIAGNOSIS — F411 Generalized anxiety disorder: Secondary | ICD-10-CM | POA: Diagnosis not present

## 2021-07-30 DIAGNOSIS — F3176 Bipolar disorder, in full remission, most recent episode depressed: Secondary | ICD-10-CM

## 2021-07-30 DIAGNOSIS — F5101 Primary insomnia: Secondary | ICD-10-CM

## 2021-07-30 MED ORDER — GABAPENTIN 300 MG PO CAPS: 300.0000 mg | ORAL_CAPSULE | ORAL | 0 refills | Status: DC

## 2021-07-30 MED ORDER — VENLAFAXINE HCL ER 75 MG PO CP24: 75.0000 mg | ORAL_CAPSULE | Freq: Every day | ORAL | 0 refills | Status: DC

## 2021-07-30 MED ORDER — BUSPIRONE HCL 10 MG PO TABS: 20.0000 mg | ORAL_TABLET | Freq: Two times a day (BID) | ORAL | 0 refills | Status: DC

## 2021-07-30 NOTE — Progress Notes (Signed)
[place] MD OP Progress Note  07/30/2021 3:54 PM Stacy [name]  MRN:  [id number]  Chief Complaint:  Chief Complaint  Patient presents with   Follow-up: 61 year old Caucasian female who has a history of bipolar disorder, GAD, neurocognitive disorder, presented for medication management.   HPI: Stacy [name] is a 61 year old Caucasian female, unemployed, married, lives in [place], has a history of bipolar disorder, GAD, insomnia, mild neurocognitive disorder, neuroleptic induced Parkinson's, Sjogren's syndrome, hypertension, hypothyroidism, arthritis, migraine headaches was evaluated in office today.  Patient today reports since being on the higher dose of venlafaxine she has been doing well.  Denies any significant sadness or anxiety.  Reports she has been staying motivated, does a lot of yard work and that keeps her happy.  Agreeable to continue psychotherapy sessions.  Patient had a consultation with neuropsychologist-Dr. Rodenbough-07/10/2021-testing was not repeated since it was not necessary.  As per review of notes per Dr. Johnney Ou of overall subjective and objective findings are not consistent with conditions like Alzheimer's dementia or Lewy body dementia.  Other cortical or subcortical dementias are also not consistent with current results.  Most likely explanation of her cognitive difficulties and the course of these difficulties would be residual impact of significant underlying psychiatric issues including bipolar disorder, significant depression and sleep problems.  Visit Diagnosis:    ICD-10-CM   1. Bipolar disorder, in full remission, most recent episode depressed (HCC)  F31.76 venlafaxine XR (EFFEXOR-XR) 75 MG 24 hr capsule   type 2    2. GAD (generalized anxiety disorder)  F41.1 gabapentin (NEURONTIN) 300 MG capsule    busPIRone (BUSPAR) 10 MG tablet    3. Mild cognitive disorder  F09    likely secondary to depression, anxiety , insomnia    4. Primary insomnia   F51.01       Past Psychiatric History: Reviewed past psychiatric history from progress note on 12/11/2017.  Past trials of Paxil, Wellbutrin, risperidone, Latuda, Prolixin, Seroquel, Abilify, Zyprexa, Invega, Topamax, Lexapro, Belsomra, Depakote, Rexulti-could not afford it.  Past Medical History:  Past Medical History:  Diagnosis Date   Aneurysm ([place])    Anxiety    Arthritis    Brain stem lesion    Chronic kidney disease    kideny stone   Depression    Family history of adverse reaction to anesthesia    mother has N/V   Fibromyalgia    GERD (gastroesophageal reflux disease)    Headache    Hypotension    Hypothyroid    Memory loss    Migraine    Raynaud disease    Sjogren's syndrome ([place])    Thyroid disease     Past Surgical History:  Procedure Laterality Date   ABDOMINAL HYSTERECTOMY     BASAL CELL CARCINOMA EXCISION     BREAST BIOPSY Left    CESAREAN SECTION     1982, 1984   CYSTOSCOPY WITH STENT PLACEMENT Bilateral 08/01/2020   Procedure: CYSTOSCOPY WITH STENT PLACEMENT;  Surgeon: Hollice Espy, MD;  Location: [facility];  Service: Urology;  Laterality: Bilateral;   CYSTOSCOPY/URETEROSCOPY/HOLMIUM LASER/STENT PLACEMENT Bilateral 08/14/2020   Procedure: CYSTOSCOPY/URETEROSCOPY/HOLMIUM LASER/STENT PLACEMENT;  Surgeon: Hollice Espy, MD;  Location: [facility] OR[facility];  Service: Urology;  Laterality: Bilateral;   EYE SURGERY Right    cataract   KNEE SURGERY Left    torn meniscus   LITHOTRIPSY     MUSCLE BIOPSY     OOPHORECTOMY      Family Psychiatric History: Reviewed family psychiatric history from progress note on 12/11/2017.  Family History:  Family History  Problem Relation Age of Onset   Migraines Mother    Anxiety disorder Mother    Depression Mother    Cancer Father    Hypertension Father    Stroke Father    Heart attack Father    Alcohol abuse Father    Diabetes Brother    Alcohol abuse Sister    Drug abuse Sister    Anxiety disorder Sister     Depression Sister    Drug abuse Brother    Cancer Paternal Aunt    Lupus Paternal Aunt    Thyroid disease Paternal Aunt    Diabetes Maternal Grandfather    Anxiety disorder Maternal Grandmother    Depression Maternal Grandmother     Social History: Reviewed social history from progress note on 12/11/2017. Social History   Socioeconomic History   Marital status: Married    Spouse name: [name]   Number of children: 2   Years of education: 14   Highest education level: Associate degree: occupational, Hotel manager, or vocational program  Occupational History   Occupation: Disabled  Tobacco Use   Smoking status: Never   Smokeless tobacco: Never  Vaping Use   Vaping Use: Never used  Substance and Sexual Activity   Alcohol use: No    Alcohol/week: 0.0 standard drinks   Drug use: Never   Sexual activity: Yes    Birth control/protection: Surgical  Other Topics Concern   Not on file  Social History Narrative   Lives at home with husband. [name]).    Disabled.   Education college.   Right handed.   2 cups caffeine/daily   Social Determinants of Health   Financial Resource Strain: Not on file  Food Insecurity: Not on file  Transportation Needs: Not on file  Physical Activity: Not on file  Stress: Not on file  Social Connections: Not on file    Allergies:  Allergies  Allergen Reactions   Penicillins Hives   Amoxicillin Hives   Depakote [Divalproex Sodium]     vomiting   Lamictal [Lamotrigine]     Side effects of abdominal cramps, nausea , diarrhea ,nightmares    Metabolic Disorder Labs: Lab Results  Component Value Date   HGBA1C 5.0 11/13/2015   No results found for: PROLACTIN No results found for: CHOL, TRIG, HDL, CHOLHDL, VLDL, LDLCALC Lab Results  Component Value Date   TSH 1.780 10/26/2015   TSH 0.525 03/15/2015    Therapeutic Level Labs: No results found for: LITHIUM Lab Results  Component Value Date   VALPROATE 10 (L) 07/28/2019   No components  found for:  CBMZ  Current Medications: Current Outpatient Medications  Medication Sig Dispense Refill   alendronate (FOSAMAX) 70 MG tablet 1 tablet 30 minutes before the first food, beverage or medicine of the day dissolved in 4 ounces of water     Ascorbic Acid (VITAMIN C) 100 MG tablet Take 100 mg by mouth daily.     cevimeline (EVOXAC) 30 MG capsule Take 30 mg by mouth 3 (three) times daily.     Cholecalciferol (VITAMIN D-3) 1000 units CAPS Take 1,000 Units by mouth daily.     clonazePAM (KLONOPIN) 0.5 MG tablet Take 1 tablet (0.5 mg total) by mouth daily as needed for anxiety. AS NEEDED FOR SEVERE ANXIETY ATTACKS 15 tablet 2   cyclobenzaprine (FLEXERIL) 5 MG tablet Take 5 mg by mouth 3 (three) times daily as needed.     donepezil (ARICEPT) 10 MG tablet Take 10 mg  by mouth every morning.     HYDROcodone-acetaminophen (NORCO/VICODIN) 5-325 MG tablet Take 1-2 tablets by mouth every 6 (six) hours as needed for moderate pain. 10 tablet 0   hydroxychloroquine (PLAQUENIL) 200 MG tablet Take 200 mg by mouth 2 (two) times daily.     levothyroxine (SYNTHROID, LEVOTHROID) 175 MCG tablet Take 1 tablet (175 mcg total) by mouth daily before breakfast. 90 tablet 3   metroNIDAZOLE (FLAGYL) 500 MG tablet Take 500 mg by mouth 3 (three) times daily.     Multiple Vitamins-Minerals (PRESERVISION AREDS) CAPS Take 1 capsule by mouth daily.     Na Sulfate-K Sulfate-Mg Sulf 17.5-3.13-1.6 GM/177ML SOLN MIX AND DRINK AS DIRECTED     omeprazole (PRILOSEC) 40 MG capsule Take 40 mg by mouth daily.     pilocarpine (SALAGEN) 5 MG tablet Take 1 tablet by mouth 2 (two) times daily.     polyethylene glycol (MIRALAX / GLYCOLAX) 17 g packet Take 17 g by mouth daily. 14 each 0   predniSONE (DELTASONE) 10 MG tablet Take 10 mg by mouth 2 (two) times daily.     solifenacin (VESICARE) 5 MG tablet Take 5 mg by mouth daily.     SUMAtriptan (IMITREX) 100 MG tablet Take 100 mg by mouth every 2 (two) hours as needed for migraine.      vitamin E 45 MG (100 UNITS) capsule Take 100 Units by mouth daily.     busPIRone (BUSPAR) 10 MG tablet Take 2 tablets (20 mg total) by mouth 2 (two) times daily. 360 tablet 0   ciprofloxacin (CIPRO) 500 MG tablet Take 500 mg by mouth 2 (two) times daily. (Patient not taking: Reported on 07/30/2021)     eszopiclone (LUNESTA) 1 MG TABS tablet Take 1 tablet (1 mg total) by mouth at bedtime as needed for sleep. Take immediately before bedtime (Patient not taking: Reported on 07/30/2021) 15 tablet 1   gabapentin (NEURONTIN) 300 MG capsule Take 1-2 capsules (300-600 mg total) by mouth as directed. Take 1 capsule daily AM and 2 capsules daily PM 270 capsule 0   venlafaxine XR (EFFEXOR-XR) 75 MG 24 hr capsule Take 1 capsule (75 mg total) by mouth daily with breakfast. 90 capsule 0   No current facility-administered medications for this visit.     Musculoskeletal: Strength & Muscle Tone: within normal limits Gait & Station: normal Patient leans: N/A  Psychiatric Specialty Exam: Review of Systems  Psychiatric/Behavioral: Negative.    All other systems reviewed and are negative.  Blood pressure 107/72, pulse 89, temperature 98.5 F (36.9 C), temperature source Temporal, weight 157 lb 3.2 oz (71.3 kg).Body mass index is 24.62 kg/m.  General Appearance: Casual  Eye Contact:  Fair  Speech:  Clear and Coherent  Volume:  Normal  Mood:  Euthymic  Affect:  Congruent  Thought Process:  Goal Directed and Descriptions of Associations: Intact  Orientation:  Full (Time, Place, and Person)  Thought Content: Logical   Suicidal Thoughts:  No  Homicidal Thoughts:  No  Memory:  Immediate;   Fair Recent;   Fair Remote;   limited  Judgement:  Fair  Insight:  Fair  Psychomotor Activity:  Normal  Concentration:  Concentration: Fair and Attention Span: Fair  Recall:  [employer] of Knowledge: Fair  Language: Fair  Akathisia:  No  Handed:  Right  AIMS (if indicated): done  Assets:  Communication  Skills Desire for Improvement Housing Social Support  ADL's:  Intact  Cognition: WNL  Sleep:  Fair  Screenings: [place] Office Visit from 07/30/2021 in [place] Office Visit from 06/15/2018 in [place] Office Visit from 05/20/2018 in [place] Total Score 0 5 9      [place] Office Visit from 05/07/2021 in [place] Office Visit from 04/16/2021 in [place] Office Visit from 03/27/2021 in [place] from 03/21/2021 in [place] Video Visit from 03/13/2021 in [place]  Total GAD-7 Score '9 14 17 21 15      '$ [place] Office Visit from 01/01/2016 in [facility] Office Visit from 11/13/2015 in [facility] Office Visit from 10/26/2015 in [facility] Office Visit from 04/25/2015 in [facility] Office Visit from 10/25/2014 in [facility]  Total Score (max 30 points ) '25 26 21 23 29      '$ PHQ2-9    [place] Visit from 07/30/2021 in [place] from 05/07/2021 in [facility] Visit from 04/16/2021 in [place] Counselor from 04/12/2021 in [place] Visit from 03/27/2021 in [place]  PHQ-2 Total Score '1 2 2 3 3  '$ PHQ-9 Total Score '3 6 5 12 9      '$ [place] Office Visit from 07/30/2021 in [place] Counselor from 05/24/2021 in [place] Office Visit from 05/07/2021 in [place] No Risk No Risk No Risk         Assessment and Plan: Ruchel Brandenburger is a 61 year old Caucasian female who has a history of bipolar disorder type II, neuroleptic induced Parkinson's disease, insomnia, cognitive disorder, Sjogren syndrome, hypothyroidism, fibromyalgia was evaluated in office today.  Patient is currently improving.  Plan Bipolar disorder in remission Gabapentin 300 mg p.o. daily in the morning and 600 mg p.o. nightly  GAD-stable Venlafaxine 75 mg p.o. daily with breakfast BuSpar 20 mg p.o. twice daily Klonopin 0.5 mg as needed for severe anxiety attacks Continue CBT with Ms. [name]  Primary insomnia-stable Lunesta 1 mg p.o. nightly-patient rarely takes it   Mild neurocognitive disorder-stable Patient was referred back for neuropsychological testing consultation-reviewed notes per Dr. Sima Matas dated 07/10/2021, testing was not repeated since it was not necessary.  As per review of notes per Dr. Johnney Ou of overall subjective and objective findings are not consistent with conditions like Alzheimer's dementia or Lewy body dementia.  Other cortical or subcortical dementias are also not consistent with current results.  Most likely explanation of her cognitive difficulties and the course of these difficulties would be residual impact of significant underlying psychiatric issues including bipolar disorder, significant depression and sleep problems.   Follow-up in clinic in 2 to 3 months or sooner if needed.  This note was generated in part or whole with voice recognition software. Voice recognition is usually quite accurate but there are transcription errors that can and very often do occur. I apologize for any typographical errors that were not detected and corrected.      Stacy Alert, MD 07/30/2021, 3:54 PM

## 2021-09-13 ENCOUNTER — Ambulatory Visit
Admission: RE | Admit: 2021-09-13 | Discharge: 2021-09-13 | Disposition: A | Discharge status: Home / Self Care | Payer: Managed Care, Other (non HMO) | Attending: Physician Assistant | Admitting: Physician Assistant

## 2021-09-13 ENCOUNTER — Ambulatory Visit
Admission: RE | Admit: 2021-09-13 | Discharge: 2021-09-13 | Disposition: A | Discharge status: Home / Self Care | Payer: Managed Care, Other (non HMO) | Source: Ambulatory Visit | Attending: Physician Assistant | Admitting: Physician Assistant

## 2021-09-13 ENCOUNTER — Ambulatory Visit (INDEPENDENT_AMBULATORY_CARE_PROVIDER_SITE_OTHER): Payer: Managed Care, Other (non HMO) | Admitting: Physician Assistant

## 2021-09-13 ENCOUNTER — Encounter: Payer: Self-pay | Admitting: Physician Assistant

## 2021-09-13 VITALS — BP 101/65 | HR 78 | Ht 67.0 in | Wt 157.0 lb

## 2021-09-13 DIAGNOSIS — N201 Calculus of ureter: Secondary | ICD-10-CM

## 2021-09-13 DIAGNOSIS — N3946 Mixed incontinence: Secondary | ICD-10-CM

## 2021-09-13 DIAGNOSIS — Z87442 Personal history of urinary calculi: Secondary | ICD-10-CM

## 2021-09-13 NOTE — Progress Notes (Signed)
09/13/2021 5:17 PM   Stacy [name] 06/03/1960 [id number]  CC: Chief Complaint  Patient presents with   Nephrolithiasis   HPI: Stacy [name] is a 61 y.o. female with PMH nephrolithiasis and constipation who presents today for annual follow-up.   Today she reports a months-long history of constant LLQ pain.  She states the pain can be dull to sharp in quality.  She has not noticed any association with bowel movements and recently underwent a colonoscopy which was normal.  She denies fever, chills, nausea, vomiting, or gross hematuria.  She also reports urinary urgency associated with occasional urge incontinence and stress incontinence.  She is not very bothered by this.  KUB today with a stable right lower pole stone.  Imaging is suboptimal due to overlying bowel contents.  In-office UA today positive for trace intact blood; urine microscopy pan negative.  PMH: Past Medical History:  Diagnosis Date   Aneurysm ([place])    Anxiety    Arthritis    Brain stem lesion    Chronic kidney disease    kideny stone   Depression    Family history of adverse reaction to anesthesia    mother has N/V   Fibromyalgia    GERD (gastroesophageal reflux disease)    Headache    Hypotension    Hypothyroid    Memory loss    Migraine    Raynaud disease    Sjogren's syndrome ([place])    Thyroid disease     Surgical History: Past Surgical History:  Procedure Laterality Date   ABDOMINAL HYSTERECTOMY     BASAL CELL CARCINOMA EXCISION     BREAST BIOPSY Left    CESAREAN SECTION     1982, 1984   CYSTOSCOPY WITH STENT PLACEMENT Bilateral 08/01/2020   Procedure: CYSTOSCOPY WITH STENT PLACEMENT;  Surgeon: Hollice Espy, MD;  Location: [facility];  Service: Urology;  Laterality: Bilateral;   CYSTOSCOPY/URETEROSCOPY/HOLMIUM LASER/STENT PLACEMENT Bilateral 08/14/2020   Procedure: CYSTOSCOPY/URETEROSCOPY/HOLMIUM LASER/STENT PLACEMENT;  Surgeon: Hollice Espy, MD;  Location: [facility];  Service:  Urology;  Laterality: Bilateral;   EYE SURGERY Right    cataract   KNEE SURGERY Left    torn meniscus   LITHOTRIPSY     MUSCLE BIOPSY     OOPHORECTOMY      Home Medications:  Allergies as of 09/13/2021       Reactions   Penicillins Hives   Amoxicillin Hives   Depakote [divalproex Sodium]    vomiting   Lamictal [lamotrigine]    Side effects of abdominal cramps, nausea , diarrhea ,nightmares        Medication List        Accurate as of September 13, 2021  5:17 PM. If you have any questions, ask your nurse or doctor.          STOP taking these medications    ciprofloxacin 500 MG tablet Commonly known as: CIPRO Stopped by: Debroah Loop, PA-C   predniSONE 10 MG tablet Commonly known as: DELTASONE Stopped by: Debroah Loop, PA-C   solifenacin 5 MG tablet Commonly known as: VESICARE Stopped by: Debroah Loop, PA-C       TAKE these medications    alendronate 70 MG tablet Commonly known as: FOSAMAX 1 tablet 30 minutes before the first food, beverage or medicine of the day dissolved in 4 ounces of water   busPIRone 10 MG tablet Commonly known as: BUSPAR Take 2 tablets (20 mg total) by mouth 2 (two) times daily.   cevimeline 30 MG capsule  Commonly known as: EVOXAC Take 30 mg by mouth 3 (three) times daily.   clonazePAM 0.5 MG tablet Commonly known as: KlonoPIN Take 1 tablet (0.5 mg total) by mouth daily as needed for anxiety. AS NEEDED FOR SEVERE ANXIETY ATTACKS   cyclobenzaprine 5 MG tablet Commonly known as: FLEXERIL Take 5 mg by mouth 3 (three) times daily as needed.   donepezil 10 MG tablet Commonly known as: ARICEPT Take 10 mg by mouth every morning.   eszopiclone 1 MG Tabs tablet Commonly known as: LUNESTA Take 1 tablet (1 mg total) by mouth at bedtime as needed for sleep. Take immediately before bedtime   gabapentin 300 MG capsule Commonly known as: NEURONTIN Take 1-2 capsules (300-600 mg total) by mouth as directed.  Take 1 capsule daily AM and 2 capsules daily PM   HYDROcodone-acetaminophen 5-325 MG tablet Commonly known as: NORCO/VICODIN Take 1-2 tablets by mouth every 6 (six) hours as needed for moderate pain.   hydroxychloroquine 200 MG tablet Commonly known as: PLAQUENIL Take 200 mg by mouth 2 (two) times daily.   levothyroxine 175 MCG tablet Commonly known as: SYNTHROID Take 1 tablet (175 mcg total) by mouth daily before breakfast.   metroNIDAZOLE 500 MG tablet Commonly known as: FLAGYL Take 500 mg by mouth 3 (three) times daily.   Na Sulfate-K Sulfate-Mg Sulf 17.5-3.13-1.6 GM/177ML Soln MIX AND DRINK AS DIRECTED   omeprazole 40 MG capsule Commonly known as: PRILOSEC Take 40 mg by mouth daily.   pilocarpine 5 MG tablet Commonly known as: SALAGEN Take 1 tablet by mouth 2 (two) times daily.   polyethylene glycol 17 g packet Commonly known as: MIRALAX / GLYCOLAX Take 17 g by mouth daily.   PreserVision AREDS Caps Take 1 capsule by mouth daily.   SUMAtriptan 100 MG tablet Commonly known as: IMITREX Take 100 mg by mouth every 2 (two) hours as needed for migraine.   venlafaxine XR 75 MG 24 hr capsule Commonly known as: EFFEXOR-XR Take 1 capsule (75 mg total) by mouth daily with breakfast.   vitamin C 100 MG tablet Take 100 mg by mouth daily.   Vitamin D-3 25 MCG (1000 UT) Caps Take 1,000 Units by mouth daily.   vitamin E 45 MG (100 UNITS) capsule Take 100 Units by mouth daily.        Allergies:  Allergies  Allergen Reactions   Penicillins Hives   Amoxicillin Hives   Depakote [Divalproex Sodium]     vomiting   Lamictal [Lamotrigine]     Side effects of abdominal cramps, nausea , diarrhea ,nightmares    Family History: Family History  Problem Relation Age of Onset   Migraines Mother    Anxiety disorder Mother    Depression Mother    Cancer Father    Hypertension Father    Stroke Father    Heart attack Father    Alcohol abuse Father    Diabetes Brother     Alcohol abuse Sister    Drug abuse Sister    Anxiety disorder Sister    Depression Sister    Drug abuse Brother    Cancer Paternal Aunt    Lupus Paternal Aunt    Thyroid disease Paternal Aunt    Diabetes Maternal Grandfather    Anxiety disorder Maternal Grandmother    Depression Maternal Grandmother     Social History:   reports that she has never smoked. She has never used smokeless tobacco. She reports that she does not drink alcohol and does not use  drugs.  Physical Exam: BP 101/65   Pulse 78   Ht '5\' 7"'$  (1.702 m)   Wt 157 lb (71.2 kg)   BMI 24.59 kg/m   Constitutional:  Alert and oriented, no acute distress, nontoxic appearing HEENT: [place], AT Cardiovascular: No clubbing, cyanosis, or edema Respiratory: Normal respiratory effort, no increased work of breathing Skin: No rashes, bruises or suspicious lesions Neurologic: Grossly intact, no focal deficits, moving all 4 extremities Psychiatric: Normal mood and affect  Laboratory Data: Results for orders placed or performed in visit on 09/13/21  Microscopic Examination   Urine  Result Value Ref Range   WBC, UA 0-5 0 - 5 /hpf   RBC, Urine 0-2 0 - 2 /hpf   Epithelial Cells (non renal) 0-10 0 - 10 /hpf   Bacteria, UA None seen None seen/Few  Urinalysis, Complete  Result Value Ref Range   Specific Gravity, UA 1.010 1.005 - 1.030   pH, UA 5.5 5.0 - 7.5   Color, UA Yellow Yellow   Appearance Ur Clear Clear   Leukocytes,UA Negative Negative   Protein,UA Negative Negative/Trace   Glucose, UA Negative Negative   Ketones, UA Negative Negative   RBC, UA Trace (A) Negative   Bilirubin, UA Negative Negative   Urobilinogen, Ur 0.2 0.2 - 1.0 mg/dL   Nitrite, UA Negative Negative   Microscopic Examination See below:    Pertinent imaging: KUB, 09/13/2021: CLINICAL DATA:  Follow-up kidney stone.   EXAM: ABDOMEN - 1 VIEW   COMPARISON:  Renal ultrasound 09/12/2020. CT 07/31/2020   FINDINGS: The punctate intrarenal calculi on  prior CT are not well seen on the current exam. Known right upper quadrant gallstone is only faintly visualized. Normal bowel gas pattern with moderate volume of colonic stool. Lung bases are clear. No acute osseous findings.   IMPRESSION: The punctate intrarenal calculi on prior CT are not well seen on the current exam. No visualized urolithiasis by radiograph.     Electronically Signed   By: Keith Rake M.D.   On: 09/14/2021 13:20  I personally reviewed the imaging above and note a stable right lower pole stone.  Imaging is suboptimal due to bowel contents.  Assessment & Plan:   1. History of nephrolithiasis UA is benign today.  Will obtain renal ultrasound to rule out underlying hydronephrosis, though we discussed that if this is negative and I do not think her constant LLQ pain is urologic in origin.  Suspect her chronic constipation may be playing a role here.  If ultrasound is negative, okay to follow-up in our clinic as needed. - Urinalysis, Complete - US RENAL; Future  2. Mixed urge and stress incontinence Minimal symptom bother.  Patient prefers to defer pharmacotherapy at this time.  We will continue to monitor.  Return if symptoms worsen or fail to improve, for Will call with results.  Debroah Loop, PA-C  [facility] [address], [place],  [address] [phone]

## 2021-09-14 LAB — URINALYSIS, COMPLETE
Bilirubin, UA: NEGATIVE
Glucose, UA: NEGATIVE
Ketones, UA: NEGATIVE
Leukocytes,UA: NEGATIVE
Nitrite, UA: NEGATIVE
Protein,UA: NEGATIVE
Specific Gravity, UA: 1.01 (ref 1.005–1.030)
Urobilinogen, Ur: 0.2 mg/dL (ref 0.2–1.0)
pH, UA: 5.5 (ref 5.0–7.5)

## 2021-09-14 LAB — MICROSCOPIC EXAMINATION: Bacteria, UA: NONE SEEN

## 2021-09-28 ENCOUNTER — Ambulatory Visit: Admission: RE | Admit: 2021-09-28 | Payer: Medicare HMO | Source: Ambulatory Visit

## 2021-10-22 ENCOUNTER — Ambulatory Visit (INDEPENDENT_AMBULATORY_CARE_PROVIDER_SITE_OTHER): Payer: Managed Care, Other (non HMO) | Admitting: Psychiatry

## 2021-10-22 ENCOUNTER — Encounter: Payer: Self-pay | Admitting: Psychiatry

## 2021-10-22 VITALS — BP 113/73 | HR 67 | Temp 98.2°F | Wt 159.0 lb

## 2021-10-22 DIAGNOSIS — F5101 Primary insomnia: Secondary | ICD-10-CM

## 2021-10-22 DIAGNOSIS — F09 Unspecified mental disorder due to known physiological condition: Secondary | ICD-10-CM | POA: Diagnosis not present

## 2021-10-22 DIAGNOSIS — F411 Generalized anxiety disorder: Secondary | ICD-10-CM

## 2021-10-22 DIAGNOSIS — F3176 Bipolar disorder, in full remission, most recent episode depressed: Secondary | ICD-10-CM

## 2021-10-22 MED ORDER — CLONAZEPAM 0.5 MG PO TABS: 0.5000 mg | ORAL_TABLET | Freq: Every day | ORAL | 2 refills | Status: DC | PRN

## 2021-10-22 MED ORDER — VENLAFAXINE HCL ER 37.5 MG PO CP24: 37.5000 mg | ORAL_CAPSULE | Freq: Every day | ORAL | 0 refills | Status: DC

## 2021-10-22 MED ORDER — GABAPENTIN 300 MG PO CAPS: 300.0000 mg | ORAL_CAPSULE | ORAL | 0 refills | Status: DC

## 2021-10-22 MED ORDER — BUSPIRONE HCL 10 MG PO TABS: 20.0000 mg | ORAL_TABLET | Freq: Two times a day (BID) | ORAL | 0 refills | Status: DC

## 2021-10-22 MED ORDER — VENLAFAXINE HCL ER 75 MG PO CP24: 75.0000 mg | ORAL_CAPSULE | Freq: Every day | ORAL | 0 refills | Status: DC

## 2021-10-22 NOTE — Progress Notes (Signed)
Stacy [name]  MRN:  [id number]  Chief Complaint:  Chief Complaint  Patient presents with   Follow-up: 61 year old Caucasian female who has a history of bipolar disorder, GAD, neurocognitive disorder, presented for medication management.   HPI: Stacy [name] is a 61 year old Caucasian female, unemployed, married, lives in [place], has a history of bipolar disorder, GAD, insomnia, mild neurocognitive disorder, neuroleptic induced Parkinson's, Sjogren syndrome, hypertension, hypothyroidism, arthritis, migraine headaches was evaluated in office today.  Patient today reports she continues to feel restless, fidgety, anxious.  She however has been going out with her spouse as well as going out with her friends.  She however is unable to go out on her own, it makes her very anxious when she tries to do that.  Patient reports she is currently getting meals from this company called ' [employer]'.  That has been helpful since they send her recipes and all the ingredients necessary.  She reports these are simple recipes and hence she has been trying to cook with her husband daily in the kitchen which has been fun for her.  Patient reports sleep is good.  Reports she has not noticed any worsening memory issues.  Continues to need help from her spouse.  Appeared to be alert, oriented to person place time situation.  Was able to answer questions appropriately.  Denies any side effects to medications.  Denies any suicidality, homicidality or perceptual disturbances.  Does have a history of Sjogren syndrome, and reports recently she has been having choking episodes, may have happened 4 times in the past 7 to 8 months.  Has upcoming appointment with her providers for an evaluation.  Denies any other concerns today.  Visit Diagnosis:    ICD-10-CM   1. Bipolar disorder, in full remission, most recent episode depressed (HCC)  F31.76 venlafaxine XR  (EFFEXOR-XR) 75 MG 24 hr capsule    venlafaxine XR (EFFEXOR XR) 37.5 MG 24 hr capsule   type 2    2. GAD (generalized anxiety disorder)  F41.1 venlafaxine XR (EFFEXOR XR) 37.5 MG 24 hr capsule    gabapentin (NEURONTIN) 300 MG capsule    clonazePAM (KLONOPIN) 0.5 MG tablet    busPIRone (BUSPAR) 10 MG tablet    3. Mild cognitive disorder  F09    Likely secondary to sleep problems and mood.    4. Primary insomnia  F51.01       Past Psychiatric History: Reviewed past psychiatric history from progress note on 12/11/2017.  Past trials of Paxil, Wellbutrin, risperidone, Latuda, Prolixin, Seroquel, Abilify, Zyprexa, Invega, Topamax, Lexapro, Belsomra, Depakote, Rexulti-could not afford it.  Patient had neuropsychological completed by Dr. Rodenbough-07/10/2021-not consistent with conditions like Alzheimer's, Lewy body dementia other cortical or subcortical dementias.  Cognitive issues likely due to psychiatric issue as well as sleep problems.  Past Medical History:  Past Medical History:  Diagnosis Date   Aneurysm ([place])    Anxiety    Arthritis    Brain stem lesion    Chronic kidney disease    kideny stone   Depression    Family history of adverse reaction to anesthesia    mother has N/V   Fibromyalgia    GERD (gastroesophageal reflux disease)    Headache    Hypotension    Hypothyroid    Memory loss    Migraine    Raynaud disease    Sjogren's syndrome ([place])    Thyroid disease     Past Surgical History:  Procedure Laterality Date   ABDOMINAL HYSTERECTOMY     BASAL CELL CARCINOMA EXCISION     BREAST BIOPSY Left    CESAREAN SECTION     1982, 1984   CYSTOSCOPY WITH STENT PLACEMENT Bilateral 08/01/2020   Procedure: CYSTOSCOPY WITH STENT PLACEMENT;  Surgeon: Hollice Espy, MD;  Location: [facility];  Service: Urology;  Laterality: Bilateral;   CYSTOSCOPY/URETEROSCOPY/HOLMIUM LASER/STENT PLACEMENT Bilateral 08/14/2020   Procedure: CYSTOSCOPY/URETEROSCOPY/HOLMIUM LASER/STENT PLACEMENT;   Surgeon: Hollice Espy, MD;  Location: [facility];  Service: Urology;  Laterality: Bilateral;   EYE SURGERY Right    cataract   KNEE SURGERY Left    torn meniscus   LITHOTRIPSY     MUSCLE BIOPSY     OOPHORECTOMY      Family Psychiatric History: Reviewed family psychiatric history from progress note on 12/11/2017.  Family History:  Family History  Problem Relation Age of Onset   Migraines Mother    Anxiety disorder Mother    Depression Mother    Cancer Father    Hypertension Father    Stroke Father    Heart attack Father    Alcohol abuse Father    Diabetes Brother    Alcohol abuse Sister    Drug abuse Sister    Anxiety disorder Sister    Depression Sister    Drug abuse Brother    Cancer Paternal Aunt    Lupus Paternal Aunt    Thyroid disease Paternal Aunt    Diabetes Maternal Grandfather    Anxiety disorder Maternal Grandmother    Depression Maternal Grandmother     Social History: Reviewed social history from progress note on 12/11/2017. Social History   Socioeconomic History   Marital status: Married    Spouse name: [name]   Number of children: 2   Years of education: 14   Highest education level: Associate degree: occupational, Hotel manager, or vocational program  Occupational History   Occupation: Disabled  Tobacco Use   Smoking status: Never   Smokeless tobacco: Never  Vaping Use   Vaping Use: Never used  Substance and Sexual Activity   Alcohol use: No    Alcohol/week: 0.0 standard drinks of alcohol   Drug use: Never   Sexual activity: Yes    Birth control/protection: Surgical  Other Topics Concern   Not on file  Social History Narrative   Lives at home with husband. [name]).    Disabled.   Education college.   Right handed.   2 cups caffeine/daily   Social Determinants of Health   Financial Resource Strain: Low Risk  (12/10/2017)   Overall Financial Resource Strain (CARDIA)    Difficulty of Paying Living Expenses: Not hard at all  Food  Insecurity: No Food Insecurity (12/10/2017)   Hunger Vital Sign    Worried About Running Out of Food in the Last Year: Never true    Ran Out of Food in the Last Year: Never true  Transportation Needs: No Transportation Needs (12/10/2017)   PRAPARE - Hydrologist (Medical): No    Lack of Transportation (Non-Medical): No  Physical Activity: Sufficiently Active (12/10/2017)   Exercise Vital Sign    Days of Exercise per Week: 7 days    Minutes of Exercise per Session: 30 min  Stress: Stress Concern Present (12/10/2017)   [place]    Feeling of Stress : Very much  Social Connections: Unknown (12/10/2017)   Social Connection and Isolation Panel [NHANES]  Frequency of Communication with Friends and Family: Not on file    Frequency of Social Gatherings with Friends and Family: Not on file    Attends Religious Services: More than 4 times per year    Active Member of [employer] or Organizations: No    Attends Archivist Meetings: Never    Marital Status: Married    Allergies:  Allergies  Allergen Reactions   Penicillins Hives   Amoxicillin Hives   Depakote [Divalproex Sodium]     vomiting   Lamictal [Lamotrigine]     Side effects of abdominal cramps, nausea , diarrhea ,nightmares    Metabolic Disorder Labs: Lab Results  Component Value Date   HGBA1C 5.0 11/13/2015   No results found for: "PROLACTIN" No results found for: "CHOL", "TRIG", "HDL", "CHOLHDL", "VLDL", "LDLCALC" Lab Results  Component Value Date   TSH 1.780 10/26/2015   TSH 0.525 03/15/2015    Therapeutic Level Labs: No results found for: "LITHIUM" Lab Results  Component Value Date   VALPROATE 10 (L) 07/28/2019   No results found for: "CBMZ"  Current Medications: Current Outpatient Medications  Medication Sig Dispense Refill   alendronate (FOSAMAX) 70 MG tablet 1 tablet 30 minutes before the first food,  beverage or medicine of the day dissolved in 4 ounces of water     Ascorbic Acid (VITAMIN C) 100 MG tablet Take 100 mg by mouth daily.     cevimeline (EVOXAC) 30 MG capsule Take 30 mg by mouth 3 (three) times daily.     Cholecalciferol (VITAMIN D-3) 1000 units CAPS Take 1,000 Units by mouth daily.     cyclobenzaprine (FLEXERIL) 5 MG tablet Take 5 mg by mouth 3 (three) times daily as needed.     donepezil (ARICEPT) 10 MG tablet Take 10 mg by mouth every morning.     eszopiclone (LUNESTA) 1 MG TABS tablet Take 1 tablet (1 mg total) by mouth at bedtime as needed for sleep. Take immediately before bedtime 15 tablet 1   HYDROcodone-acetaminophen (NORCO/VICODIN) 5-325 MG tablet Take 1-2 tablets by mouth every 6 (six) hours as needed for moderate pain. 10 tablet 0   hydroxychloroquine (PLAQUENIL) 200 MG tablet Take 200 mg by mouth 2 (two) times daily.     levothyroxine (SYNTHROID, LEVOTHROID) 175 MCG tablet Take 1 tablet (175 mcg total) by mouth daily before breakfast. 90 tablet 3   metroNIDAZOLE (FLAGYL) 500 MG tablet Take 500 mg by mouth 3 (three) times daily.     Multiple Vitamins-Minerals (PRESERVISION AREDS) CAPS Take 1 capsule by mouth daily.     Na Sulfate-K Sulfate-Mg Sulf 17.5-3.13-1.6 GM/177ML SOLN MIX AND DRINK AS DIRECTED     omeprazole (PRILOSEC) 40 MG capsule Take 40 mg by mouth daily.     pilocarpine (SALAGEN) 5 MG tablet Take 1 tablet by mouth 2 (two) times daily.     polyethylene glycol (MIRALAX / GLYCOLAX) 17 g packet Take 17 g by mouth daily. 14 each 0   SUMAtriptan (IMITREX) 100 MG tablet Take 100 mg by mouth every 2 (two) hours as needed for migraine.     venlafaxine XR (EFFEXOR XR) 37.5 MG 24 hr capsule Take 1 capsule (37.5 mg total) by mouth daily with breakfast. Take along with 75 mg daily 90 capsule 0   vitamin E 45 MG (100 UNITS) capsule Take 100 Units by mouth daily.     busPIRone (BUSPAR) 10 MG tablet Take 2 tablets (20 mg total) by mouth 2 (two) times daily. 360 tablet 0  clonazePAM (KLONOPIN) 0.5 MG tablet Take 1 tablet (0.5 mg total) by mouth daily as needed for anxiety. AS NEEDED FOR SEVERE ANXIETY ATTACKS 15 tablet 2   gabapentin (NEURONTIN) 300 MG capsule Take 1-2 capsules (300-600 mg total) by mouth as directed. Take 1 capsule daily AM and 2 capsules daily PM 270 capsule 0   venlafaxine XR (EFFEXOR-XR) 75 MG 24 hr capsule Take 1 capsule (75 mg total) by mouth daily with breakfast. 90 capsule 0   No current facility-administered medications for this visit.     Musculoskeletal: Strength & Muscle Tone: within normal limits Gait & Station: normal Patient leans: N/A  Psychiatric Specialty Exam: Review of Systems  Psychiatric/Behavioral:  The patient is nervous/anxious.   All other systems reviewed and are negative.   Blood pressure 113/73, pulse 67, temperature 98.2 F (36.8 C), temperature source Temporal, weight 159 lb (72.1 kg).Body mass index is 24.9 kg/m.  General Appearance: Casual  Eye Contact:  Fair  Speech:  Clear and Coherent  Volume:  Normal  Mood:  Anxious  Affect:  Congruent  Thought Process:  Goal Directed and Descriptions of Associations: Intact  Orientation:  Full (Time, Place, and Person)  Thought Content: Logical   Suicidal Thoughts:  No  Homicidal Thoughts:  No  Memory:  Immediate;   Fair Recent;   Fair Remote;   Limited  Judgement:  Fair  Insight:  Fair  Psychomotor Activity:  Restlessness  Concentration:  Concentration: Fair and Attention Span: Fair  Recall:  [employer] of Knowledge: Fair  Language: Fair  Akathisia:  No  Handed:  Right  AIMS (if indicated): done  Assets:  Communication Skills Desire for Improvement Housing Intimacy Talents/Skills Transportation  ADL's:  Intact  Cognition: WNL  Sleep:  Fair   Screenings: [place] Visit from 10/22/2021 in [place] Office Visit from 07/30/2021 in [place] Office Visit from  06/15/2018 in [place] Office Visit from 05/20/2018 in [place] Total Score 0 0 5 [place] Visit from 10/22/2021 in [place] from 05/07/2021 in [place] from 04/16/2021 in [place] from 03/27/2021 in [place] from 03/21/2021 in [place]  Total GAD-7 Score '8 9 14 17 21      '$ [place] Visit from 01/01/2016 in [facility] Office Visit from 11/13/2015 in [facility] Office Visit from 10/26/2015 in [facility] Office Visit from 04/25/2015 in [facility] Office Visit from 10/25/2014 in [facility]  Total Score (max 30 points ) '25 26 21 23 29      '$ PHQ2-9    [place] Visit from 10/22/2021 in [place] [facility] Visit from 07/30/2021 in [place] Visit from 05/07/2021 in [place] Visit from 04/16/2021 in [facility] from 04/12/2021 in [place]  PHQ-2 Total Score '1 1 2 2 3  '$ PHQ-9 Total Score -- '3 6 5 12      '$ [place] Visit from 10/22/2021 in [place] Office Visit from 07/30/2021 in [place] [facility] from 05/24/2021 in [place] No Risk No Risk No Risk  Assessment and Plan: Stacy [name] is a 61 year old Caucasian female who has a history of bipolar disorder type II, neuroleptic induced Parkinson's disease, insomnia, cognitive disorder, Sjogren syndrome, hypothyroidism,  fibromyalgia was evaluated in office today.  Patient with anxiety, restlessness, will benefit from the following plan.  Plan Bipolar disorder in remission Gabapentin 300 mg p.o. daily in the morning and 600 mg p.o. nightly.  Will consider increasing the dosage in the future to address her anxiety.   GAD-unstable Increase venlafaxine to 112.75 milligram p.o. daily. BuSpar 20 mg p.o. twice daily Klonopin 0.5 mg as needed for severe anxiety attacks.  Patient advised to limit use.  Reviewed [place] PMP aware. Continue CBT with Ms. [name]  Primary insomnia-stable Lunesta 1 mg p.o. nightly-he rarely takes it.  Mild neurocognitive disorder-stable Patient had neuropsychological testing completed by Dr. Rodenbough-07/10/2021.  Follow-up in clinic in 8 weeks or sooner if needed.  This note was generated in part or whole with voice recognition software. Voice recognition is usually quite accurate but there are transcription errors that can and very often do occur. I apologize for any typographical errors that were not detected and corrected.     Ursula Alert, MD 10/22/2021, 10:54 AM

## 2021-11-21 ENCOUNTER — Ambulatory Visit (HOSPITAL_COMMUNITY): Payer: 59 | Admitting: Licensed Clinical Social Worker

## 2021-12-26 ENCOUNTER — Ambulatory Visit (INDEPENDENT_AMBULATORY_CARE_PROVIDER_SITE_OTHER): Payer: 59 | Admitting: Psychiatry

## 2021-12-26 ENCOUNTER — Encounter: Payer: Self-pay | Admitting: Psychiatry

## 2021-12-26 VITALS — BP 107/75 | HR 91 | Temp 98.8°F | Ht 67.0 in | Wt 155.2 lb

## 2021-12-26 DIAGNOSIS — F3176 Bipolar disorder, in full remission, most recent episode depressed: Secondary | ICD-10-CM | POA: Diagnosis not present

## 2021-12-26 DIAGNOSIS — F411 Generalized anxiety disorder: Secondary | ICD-10-CM | POA: Diagnosis not present

## 2021-12-26 DIAGNOSIS — F5101 Primary insomnia: Secondary | ICD-10-CM | POA: Diagnosis not present

## 2021-12-26 DIAGNOSIS — F09 Unspecified mental disorder due to known physiological condition: Secondary | ICD-10-CM

## 2021-12-26 MED ORDER — GABAPENTIN 300 MG PO CAPS: 1200.0000 mg | ORAL_CAPSULE | ORAL | 0 refills | Status: DC

## 2021-12-26 NOTE — Progress Notes (Signed)
[facility] MD OP Progress Note  12/26/2021 5:20 PM Stacy [name]  MRN:  [id number]  Chief Complaint:  Chief Complaint  Patient presents with   Follow-up   Anxiety   Depression   HPI: Stacy [name] is a 60 year old Caucasian female, unemployed, married, lives in [place], has a history of bipolar disorder type II, GAD, insomnia, mild neurocognitive disorder, neuroleptic induced Parkinson's, Sjogren syndrome, hypertension, hypothyroidism, arthritis, migraine headaches was evaluated in office today.  Patient today reports she is currently struggling with anxiety, gets overwhelmed on and off.  She reports she has psychosocial stressors of enrolling herself for the benefit program through her spouse.  She reports this has been overwhelming for her since she has to answer certain questionnaires.  She reports she gets tearful when she gets overwhelmed and it can happen couple of times a week.  She also has zoning out episodes on and off, not sure what triggers it.  However this was observed by her husband.  Last for a few seconds.  Agrees to contact neurologist.  She is under the care of Dr. Hervey Ard.  Reports sleep is overall okay.  Uses the Lunesta only as needed.  Patient denies any suicidality, homicidality or perceptual disturbances.  Patient is compliant on medications.  Denies side effects.  Continues to follow-up with her therapist, has upcoming appointment.  Denies any other concerns today.  Visit Diagnosis:    ICD-10-CM   1. Bipolar disorder, in full remission, most recent episode depressed ([place])  F31.76    Type II    2. GAD (generalized anxiety disorder)  F41.1 gabapentin (NEURONTIN) 300 MG capsule    3. Mild cognitive disorder  F09    Likely secondary to sleep problems, mood    4. Primary insomnia  F51.01       Past Psychiatric History: Reviewed past psychiatric history from progress note on 12/11/2017.  Past trials of Paxil, Wellbutrin, risperidone, Latuda, Prolixin,  Seroquel, Abilify, Zyprexa, Invega, Topamax, Lexapro, Belsomra, Depakote, Rexulti-could not afford it.  Neuropsychological testing completed by Dr. Rodenbough-07/10/2021-not consistent with conditions like Alzheimer's, Lewy body dementia or other cortical or subcortical dementias.  Cognitive issues likely due to psychiatric problems.  Past Medical History:  Past Medical History:  Diagnosis Date   Aneurysm ([place])    Anxiety    Arthritis    Brain stem lesion    Chronic kidney disease    kideny stone   Depression    Family history of adverse reaction to anesthesia    mother has N/V   Fibromyalgia    GERD (gastroesophageal reflux disease)    Headache    Hypotension    Hypothyroid    Memory loss    Migraine    Raynaud disease    Sjogren's syndrome ([place])    Thyroid disease     Past Surgical History:  Procedure Laterality Date   ABDOMINAL HYSTERECTOMY     BASAL CELL CARCINOMA EXCISION     BREAST BIOPSY Left    CESAREAN SECTION     1982, 1984   CYSTOSCOPY WITH STENT PLACEMENT Bilateral 08/01/2020   Procedure: CYSTOSCOPY WITH STENT PLACEMENT;  Surgeon: Hollice Espy, MD;  Location: [facility] OR[facility];  Service: Urology;  Laterality: Bilateral;   CYSTOSCOPY/URETEROSCOPY/HOLMIUM LASER/STENT PLACEMENT Bilateral 08/14/2020   Procedure: CYSTOSCOPY/URETEROSCOPY/HOLMIUM LASER/STENT PLACEMENT;  Surgeon: Hollice Espy, MD;  Location: [facility];  Service: Urology;  Laterality: Bilateral;   EYE SURGERY Right    cataract   KNEE SURGERY Left    torn meniscus   LITHOTRIPSY  MUSCLE BIOPSY     OOPHORECTOMY      Family Psychiatric History: Reviewed family psychiatric history from progress note on 12/11/2017.  Family History:  Family History  Problem Relation Age of Onset   Migraines Mother    Anxiety disorder Mother    Depression Mother    Cancer Father    Hypertension Father    Stroke Father    Heart attack Father    Alcohol abuse Father    Diabetes Brother    Alcohol abuse Sister    Drug  abuse Sister    Anxiety disorder Sister    Depression Sister    Drug abuse Brother    Cancer Paternal Aunt    Lupus Paternal Aunt    Thyroid disease Paternal Aunt    Diabetes Maternal Grandfather    Anxiety disorder Maternal Grandmother    Depression Maternal Grandmother     Social History: Reviewed social history from progress note on 12/11/2017. Social History   Socioeconomic History   Marital status: Married    Spouse name: [name]   Number of children: 2   Years of education: 14   Highest education level: Associate degree: occupational, Hotel manager, or vocational program  Occupational History   Occupation: Disabled  Tobacco Use   Smoking status: Never   Smokeless tobacco: Never  Vaping Use   Vaping Use: Never used  Substance and Sexual Activity   Alcohol use: No    Alcohol/week: 0.0 standard drinks of alcohol   Drug use: Never   Sexual activity: Yes    Birth control/protection: Surgical  Other Topics Concern   Not on file  Social History Narrative   Lives at home with husband. [name]).    Disabled.   Education college.   Right handed.   2 cups caffeine/daily   Social Determinants of Health   Financial Resource Strain: Low Risk  (12/10/2017)   Overall Financial Resource Strain (CARDIA)    Difficulty of Paying Living Expenses: Not hard at all  Food Insecurity: No Food Insecurity (12/10/2017)   Hunger Vital Sign    Worried About Running Out of Food in the Last Year: Never true    Ran Out of Food in the Last Year: Never true  Transportation Needs: No Transportation Needs (12/10/2017)   PRAPARE - Hydrologist (Medical): No    Lack of Transportation (Non-Medical): No  Physical Activity: Sufficiently Active (12/10/2017)   Exercise Vital Sign    Days of Exercise per Week: 7 days    Minutes of Exercise per Session: 30 min  Stress: Stress Concern Present (12/10/2017)   [place]    Feeling of Stress : Very much  Social Connections: Unknown (12/10/2017)   Social Connection and Isolation Panel [NHANES]    Frequency of Communication with Friends and Family: Not on file    Frequency of Social Gatherings with Friends and Family: Not on file    Attends Religious Services: More than 4 times per year    Active Member of [employer] or Organizations: No    Attends Archivist Meetings: Never    Marital Status: Married    Allergies:  Allergies  Allergen Reactions   Penicillins Hives   Amoxicillin Hives   Depakote [Divalproex Sodium]     vomiting   Lamictal [Lamotrigine]     Side effects of abdominal cramps, nausea , diarrhea ,nightmares    Metabolic Disorder Labs: Lab Results  Component Value  Date   HGBA1C 5.0 11/13/2015   No results found for: "PROLACTIN" No results found for: "CHOL", "TRIG", "HDL", "CHOLHDL", "VLDL", "LDLCALC" Lab Results  Component Value Date   TSH 1.780 10/26/2015   TSH 0.525 03/15/2015    Therapeutic Level Labs: No results found for: "LITHIUM" Lab Results  Component Value Date   VALPROATE 10 (L) 07/28/2019   No results found for: "CBMZ"  Current Medications: Current Outpatient Medications  Medication Sig Dispense Refill   alendronate (FOSAMAX) 70 MG tablet 1 tablet 30 minutes before the first food, beverage or medicine of the day dissolved in 4 ounces of water     Ascorbic Acid (VITAMIN C) 100 MG tablet Take 100 mg by mouth daily.     busPIRone (BUSPAR) 10 MG tablet Take 2 tablets (20 mg total) by mouth 2 (two) times daily. 360 tablet 0   cevimeline (EVOXAC) 30 MG capsule Take 30 mg by mouth 3 (three) times daily.     Cholecalciferol (VITAMIN D-3) 1000 units CAPS Take 1,000 Units by mouth daily.     clonazePAM (KLONOPIN) 0.5 MG tablet Take 1 tablet (0.5 mg total) by mouth daily as needed for anxiety. AS NEEDED FOR SEVERE ANXIETY ATTACKS 15 tablet 2   cyclobenzaprine (FLEXERIL) 5 MG tablet Take 5 mg by mouth 3  (three) times daily as needed.     donepezil (ARICEPT) 10 MG tablet Take 10 mg by mouth every morning.     eszopiclone (LUNESTA) 1 MG TABS tablet Take 1 tablet (1 mg total) by mouth at bedtime as needed for sleep. Take immediately before bedtime 15 tablet 1   hydroxychloroquine (PLAQUENIL) 200 MG tablet Take 200 mg by mouth 2 (two) times daily.     levothyroxine (SYNTHROID, LEVOTHROID) 175 MCG tablet Take 1 tablet (175 mcg total) by mouth daily before breakfast. 90 tablet 3   metroNIDAZOLE (FLAGYL) 500 MG tablet Take 500 mg by mouth 3 (three) times daily.     Multiple Vitamins-Minerals (PRESERVISION AREDS) CAPS Take 1 capsule by mouth daily.     Na Sulfate-K Sulfate-Mg Sulf 17.5-3.13-1.6 GM/177ML SOLN MIX AND DRINK AS DIRECTED     omeprazole (PRILOSEC) 40 MG capsule Take 40 mg by mouth daily.     pilocarpine (SALAGEN) 5 MG tablet Take 1 tablet by mouth 2 (two) times daily.     polyethylene glycol (MIRALAX / GLYCOLAX) 17 g packet Take 17 g by mouth daily. 14 each 0   SUMAtriptan (IMITREX) 100 MG tablet Take 100 mg by mouth every 2 (two) hours as needed for migraine.     venlafaxine XR (EFFEXOR XR) 37.5 MG 24 hr capsule Take 1 capsule (37.5 mg total) by mouth daily with breakfast. Take along with 75 mg daily 90 capsule 0   venlafaxine XR (EFFEXOR-XR) 75 MG 24 hr capsule Take 1 capsule (75 mg total) by mouth daily with breakfast. 90 capsule 0   vitamin E 45 MG (100 UNITS) capsule Take 100 Units by mouth daily.     gabapentin (NEURONTIN) 300 MG capsule Take 4 capsules (1,200 mg total) by mouth as directed. Take 1 capsule daily AM , 1 capsule daily at noon and 2 capsules daily PM 360 capsule 0   No current facility-administered medications for this visit.     Musculoskeletal: Strength & Muscle Tone: within normal limits Gait & Station: normal Patient leans: N/A  Psychiatric Specialty Exam: Review of Systems  Psychiatric/Behavioral:  The patient is nervous/anxious.   All other systems reviewed  and are negative.  Blood pressure 107/75, pulse 91, temperature 98.8 F (37.1 C), temperature source Oral, height '5\' 7"'$  (1.702 m), weight 155 lb 3.2 oz (70.4 kg).Body mass index is 24.31 kg/m.  General Appearance: Casual  Eye Contact:  Fair  Speech:  Clear and Coherent  Volume:  Normal  Mood:  Anxious  Affect:  Tearful  Thought Process:  Goal Directed and Descriptions of Associations: Intact  Orientation:  Full (Time, Place, and Person)  Thought Content: Logical   Suicidal Thoughts:  No  Homicidal Thoughts:  No  Memory:  Immediate;   Fair Recent;   Fair Remote;   Limited  Judgement:  Fair  Insight:  Fair  Psychomotor Activity:  Normal  Concentration:  Concentration: Fair and Attention Span: Fair  Recall:  [employer] of Knowledge: Fair  Language: Fair  Akathisia:  No  Handed:  Right  AIMS (if indicated): not done  Assets:  Communication Skills Desire for Improvement Housing Intimacy Social Support  ADL's:  Intact  Cognition: WNL  Sleep:  Fair   Screenings: [place] Office Visit from 10/22/2021 in [facility] Office Visit from 07/30/2021 in [facility] Office Visit from 06/15/2018 in [facility] Visit from 05/20/2018 in [facility] Total Score 0 0 5 [place] Office Visit from 12/26/2021 in [facility] from 10/22/2021 in [facility] from 05/07/2021 in [facility] from 04/16/2021 in [facility] Visit from 03/27/2021 in [facility]  Total GAD-7 Score '12 8 9 14 17      '$ [place] from 01/01/2016 in [facility] Office Visit from 11/13/2015 in [facility] Office  Visit from 10/26/2015 in [facility] Office Visit from 04/25/2015 in [facility] Office Visit from 10/25/2014 in [facility]  Total Score (max 30 points ) '25 26 21 23 29      '$ PHQ2-9    [place] Visit from 12/26/2021 in [facility] Visit from 10/22/2021 in [facility] Visit from 07/30/2021 in [facility] Visit from 05/07/2021 in [facility] Visit from 04/16/2021 in [facility] [place]  PHQ-2 Total Score '2 1 1 2 2  '$ PHQ-9 Total Score 7 -- '3 6 5      '$ [place] Visit from 12/26/2021 in [place] Office Visit from 10/22/2021 in [facility] Visit from 07/30/2021 in [place] No Risk No Risk No Risk        Assessment and Plan: Stacy [name] is a 61 year old Caucasian female who has a history of bipolar disorder type II, neuroleptic induced Parkinson's disease, insomnia, cognitive disorder, Sjogren syndrome, hypothyroidism, fibromyalgia was evaluated in office today.  Patient is currently struggling with anxiety, will benefit from the following plan.  Plan Bipolar disorder in remission Continue gabapentin as prescribed  GAD-unstable Increase gabapentin to 300 mg daily in the morning, 300 mg at noon and 600 mg at bedtime Venlafaxine 112.75 mg p.o. daily BuSpar 20 mg p.o. twice daily Klonopin 0.5 mg as needed for severe anxiety attacks Reviewed [place] PMP AWARxE CBT with Ms. [name]  Primary insomnia-stable Lunesta 1 mg p.o. nightly  Mild neurocognitive disorder-chronic-patient advised to follow up with Dr. Manuella Ghazi.  Patient also reports recent zoning out episodes, patient to make an appointment with neurologist.   Follow-up in clinic in  4 weeks or sooner if needed.   This note was generated in part or whole with voice recognition software. Voice recognition is usually quite accurate but there are transcription errors that can and very often do occur. I apologize for any typographical errors that were not detected and corrected.     Ursula Alert, MD 12/27/2021, 11:05 AM

## 2022-01-02 ENCOUNTER — Ambulatory Visit (INDEPENDENT_AMBULATORY_CARE_PROVIDER_SITE_OTHER): Payer: 59 | Admitting: Licensed Clinical Social Worker

## 2022-01-02 DIAGNOSIS — F411 Generalized anxiety disorder: Secondary | ICD-10-CM | POA: Diagnosis not present

## 2022-01-02 DIAGNOSIS — F3177 Bipolar disorder, in partial remission, most recent episode mixed: Secondary | ICD-10-CM

## 2022-01-02 NOTE — Progress Notes (Unsigned)
  THERAPIST PROGRESS NOTE  Session Time: 10-11a  Participation Level: Active  Behavioral Response: Neat and Well GroomedAlertAnxious and Depressed  Type of Therapy: Individual Therapy  Treatment Goals addressed:    Problem: Bipolar Disorder CCP Problem  1 Alleviate depressive/manic symptoms and return to improved levels of effective functioning per self report 3 out of 5 sessions documented Goal: LTG: Stabilize mood and increase goal-directed behavior: Input needed on appropriate metric Outcome: Progressing Goal: STG: Reduce frequency, intensity, and duration of depression symptoms as evidenced by pt self report 3 out of 5 sessions documented Outcome: Progressing Intervention: Discuss self-management skills Note: Reviewed  Intervention: Encourage verbalization of feelings/concerns/expectations Note: Allowed/expressed:  pt expressed an increase in anxiety--reviewed anxiety coping skills Intervention: Assist with relaxation techniques, as appropriate (deep breathing exercises, meditation, guided imagery) Note: Reviewed      ProgressTowards Goals: Progressing  Interventions: CBT and Supportive  Summary: Aryka Coonradt is a 61 y.o. female who presents with  continuing symptoms related to mood disorder and anxiety. Pt reports that current mood is stable, but she is experiencing escalating symptoms of anxiety. Pt reports that Dr. Shea Evans recently changed medication dosage, but pt has not felt the effects yet. Pt reports fair quality and quantity of sleep.  Allowed pt to explore and express thoughts and feelings associated with recent life situations and external stressors. Pt reports that her husband is continuing to watch news programs on a daily basis, and is insistent that pt sit by his side the entire time. Pt reports that she is trying to set limits and boundaries with her husband, and tries to engage in activities that she finds enjoyable. Pt states that she recently went to the gym,  which she has not done in months.   Allowed pt to identify self care behaviors that she could incorporate into her routine--pt expresses that she enjoys meal planning/prepping. Assisted pt with identifying other anxiety triggers and reviewed coping skills for managing anxiety symptoms. Pt reflects understanding and is willing to cooperate w/ suggestions.   Continued recommendations are as follows: self care behaviors, positive social engagements, focusing on overall work/home/life balance, and focusing on positive physical and emotional wellness.   Suicidal/Homicidal: No  Therapist Response: Pt is continuing to apply interventions learned in session into daily life situations. Pt is currently on track to meet goals utilizing interventions mentioned above. Personal growth and progress noted. Treatment to continue as indicated.   Plan: Return again in 4 weeks.  Diagnosis:  Encounter Diagnoses  Name Primary?   Bipolar disorder, in partial remission, most recent episode mixed ([place]) Yes   GAD (generalized anxiety disorder)     Collaboration of Care: Community Stakeholder(s) AEB Pt requested in-person  and Other Pt encouraged to continue services with psychiatrist of record, Dr. Ursula Alert  Patient/Guardian was advised Release of Information must be obtained prior to any record release in order to collaborate their care with an outside provider. Patient/Guardian was advised if they have not already done so to contact the registration department to sign all necessary forms in order for [place] to release information regarding their care.   Consent: Patient/Guardian gives verbal consent for treatment and assignment of benefits for services provided during this visit. Patient/Guardian expressed understanding and agreed to proceed.   [place], LCSW 01/02/2022

## 2022-01-03 NOTE — Plan of Care (Signed)
  Problem: Bipolar Disorder CCP Problem  1 Alleviate depressive/manic symptoms and return to improved levels of effective functioning per self report 3 out of 5 sessions documented Goal: LTG: Stabilize mood and increase goal-directed behavior: Input needed on appropriate metric Outcome: Progressing Goal: STG: Reduce frequency, intensity, and duration of depression symptoms as evidenced by pt self report 3 out of 5 sessions documented Outcome: Progressing Intervention: Discuss self-management skills Note: Reviewed  Intervention: Encourage verbalization of feelings/concerns/expectations Note: Allowed/expressed:  pt expressed an increase in anxiety--reviewed anxiety coping skills Intervention: Assist with relaxation techniques, as appropriate (deep breathing exercises, meditation, guided imagery) Note: Reviewed

## 2022-01-29 ENCOUNTER — Encounter: Payer: Self-pay | Admitting: Podiatry

## 2022-01-29 ENCOUNTER — Ambulatory Visit (INDEPENDENT_AMBULATORY_CARE_PROVIDER_SITE_OTHER): Payer: Managed Care, Other (non HMO) | Admitting: Podiatry

## 2022-01-29 VITALS — BP 102/66 | HR 78

## 2022-01-29 DIAGNOSIS — L600 Ingrowing nail: Secondary | ICD-10-CM

## 2022-01-29 NOTE — Progress Notes (Signed)
   Chief Complaint  Patient presents with   Nail Problem    "I have ingrown toenails." N - ingrown toenails L - hallux rt, 2nd left; lt>rt D - 1-2 weeks O - graduallyworse C - tender, sore A - pressure T - doctor had me soaking in epsom salt    Subjective: Patient presents today for evaluation of pain to the medial border right great toe and medial border left second toe. Patient is concerned for possible ingrown nail.  It is very sensitive to touch.  Patient presents today for further treatment and evaluation.  Past Medical History:  Diagnosis Date   Aneurysm ([place])    Anxiety    Arthritis    Brain stem lesion    Chronic kidney disease    kideny stone   Depression    Family history of adverse reaction to anesthesia    mother has N/V   Fibromyalgia    GERD (gastroesophageal reflux disease)    Headache    Hypotension    Hypothyroid    Memory loss    Migraine    Raynaud disease    Sjogren's syndrome ([place])    Thyroid disease     Objective:  General: Well developed, nourished, in no acute distress, alert and oriented x3   Dermatology: Skin is warm, dry and supple bilateral.  Medial border right great toe medial border left second toe is tender with evidence of an ingrowing nail. Pain on palpation noted to the border of the nail fold. The remaining nails appear unremarkable at this time. There are no open sores, lesions.  Vascular: DP and PT pulses palpable.  No clinical evidence of vascular compromise  Neruologic: Grossly intact via light touch bilateral.  Musculoskeletal: No pedal deformity noted  Assesement: #1 Paronychia with ingrowing nail medial border right great toe medial border left second toe  Plan of Care:  1. Patient evaluated.  2. Discussed treatment alternatives and plan of care. Explained nail avulsion procedure and post procedure course to patient. 3. Patient opted for permanent partial nail avulsion of the ingrown portion of the nail.  4. Prior to  procedure, local anesthesia infiltration utilized using 3 ml of a 50:50 mixture of 2% plain lidocaine and 0.5% plain marcaine in a normal hallux block fashion and a betadine prep performed.  5. Partial permanent nail avulsion with chemical matrixectomy performed using [id number] applications of phenol followed by alcohol flush.  6. Light dressing applied.  Post care instructions provided 7.  Recommend triple antibiotic ointment and a Band-Aid 8.  Return to clinic 2 weeks.  *From [place]. Husband is a Scientist, research (medical) for [employer]  Edrick Kins, DPM [facility]  Dr. Edrick Kins, DPM    [address], [place] [address]                Office [phone]  Fax [phone]

## 2022-02-13 ENCOUNTER — Ambulatory Visit (HOSPITAL_COMMUNITY): Payer: 59 | Admitting: Licensed Clinical Social Worker

## 2022-02-21 ENCOUNTER — Ambulatory Visit (INDEPENDENT_AMBULATORY_CARE_PROVIDER_SITE_OTHER): Payer: 59 | Admitting: Psychiatry

## 2022-02-21 ENCOUNTER — Encounter: Payer: Self-pay | Admitting: Psychiatry

## 2022-02-21 VITALS — BP 104/66 | HR 87 | Temp 98.1°F | Ht 67.0 in | Wt 156.0 lb

## 2022-02-21 DIAGNOSIS — F5101 Primary insomnia: Secondary | ICD-10-CM

## 2022-02-21 DIAGNOSIS — F411 Generalized anxiety disorder: Secondary | ICD-10-CM

## 2022-02-21 DIAGNOSIS — F09 Unspecified mental disorder due to known physiological condition: Secondary | ICD-10-CM | POA: Diagnosis not present

## 2022-02-21 DIAGNOSIS — F3178 Bipolar disorder, in full remission, most recent episode mixed: Secondary | ICD-10-CM

## 2022-02-21 MED ORDER — BUSPIRONE HCL 10 MG PO TABS: 20.0000 mg | ORAL_TABLET | Freq: Two times a day (BID) | ORAL | 0 refills | Status: DC

## 2022-02-21 MED ORDER — VENLAFAXINE HCL ER 37.5 MG PO CP24: 37.5000 mg | ORAL_CAPSULE | Freq: Every day | ORAL | 0 refills | Status: DC

## 2022-02-21 MED ORDER — VENLAFAXINE HCL ER 75 MG PO CP24: 75.0000 mg | ORAL_CAPSULE | Freq: Every day | ORAL | 0 refills | Status: DC

## 2022-02-21 NOTE — Progress Notes (Signed)
[name] OP Progress Note  02/21/2022 3:34 PM Stacy [name]  MRN:  [id number]  Chief Complaint:  Chief Complaint  Patient presents with   Follow-up   Anxiety   Depression   Medication Refill   HPI: Stacy [name] is a 61 year old Caucasian female, unemployed, married, lives in [place], has a history of bipolar disorder type II, GAD, insomnia, mild neurocognitive disorder, neuroleptic induced Parkinson's, Sjogren syndrome, hypertension, hypothyroidism, arthritis, migraine headaches was evaluated in office today.  Patient today reports overall mood symptoms as improving.  She had a good holiday, reports it was quiet, she and her husband spent time together.  Reports she is doing better with regards to depression symptoms on the current medication regimen.  She is tolerating the higher dosage of gabapentin.  Denies side effects.  Patient however does report having seizure-like spells, being zoned out on and off.  Reports she was recently making tea and put dishwasher powder instead of sugar.  She has no memory of doing it.  She also reports when she drives she blacks out at times and runs red lights and is not aware of it.  She reports she did follow-up with neurology and was recommended to get EEG completed.  She reports she is currently waiting for the same.  Patient reports husband is supportive.  Patient appeared to be alert, oriented to person place time situation.  3 word memory immediately 3 out of 3, after 5 minutes 2 out of 3.  Patient had trouble with subtraction, attention and focus limited.  Patient denies any suicidality, homicidality or perceptual disturbances.      Visit Diagnosis:    ICD-10-CM   1. Bipolar disorder, in full remission, most recent episode mixed (HCC)  F31.78 venlafaxine XR (EFFEXOR XR) 37.5 MG 24 hr capsule    venlafaxine XR (EFFEXOR-XR) 75 MG 24 hr capsule   type 2    2. GAD (generalized anxiety disorder)  F41.1 busPIRone (BUSPAR) 10 MG tablet     venlafaxine XR (EFFEXOR XR) 37.5 MG 24 hr capsule    3. Mild cognitive disorder  F09    Likely secondary to sleep problem, mood    4. Primary insomnia  F51.01       Past Psychiatric History: Reviewed past theatric history from progress note on 12/11/2017.  Past trials of Paxil, Wellbutrin, risperidone, Latuda, Prolixin, Seroquel, Abilify, Zyprexa, Invega, Topamax, Lexapro, Belsomra, Depakote, Rexulti-could not afford it.  Neuropsychological testing completed by Dr. Rodenbough-07/10/2021-not consistent with conditions like Alzheimer's, Lewy body dementia or other cortical or subcortical dementias.  Cognitive issues likely due to psychiatric problems.  Past Medical History:  Past Medical History:  Diagnosis Date   Aneurysm ([place])    Anxiety    Arthritis    Brain stem lesion    Chronic kidney disease    kideny stone   Depression    Family history of adverse reaction to anesthesia    mother has N/V   Fibromyalgia    GERD (gastroesophageal reflux disease)    Headache    Hypotension    Hypothyroid    Memory loss    Migraine    Raynaud disease    Sjogren's syndrome ([place])    Thyroid disease     Past Surgical History:  Procedure Laterality Date   ABDOMINAL HYSTERECTOMY     BASAL CELL CARCINOMA EXCISION     BREAST BIOPSY Left    CESAREAN SECTION     1982, 1984   CYSTOSCOPY WITH STENT PLACEMENT Bilateral 08/01/2020  Procedure: CYSTOSCOPY WITH STENT PLACEMENT;  Surgeon: Hollice Espy, [name];  Location: [facility];  Service: Urology;  Laterality: Bilateral;   CYSTOSCOPY/URETEROSCOPY/HOLMIUM LASER/STENT PLACEMENT Bilateral 08/14/2020   Procedure: CYSTOSCOPY/URETEROSCOPY/HOLMIUM LASER/STENT PLACEMENT;  Surgeon: Hollice Espy, [name];  Location: [facility];  Service: Urology;  Laterality: Bilateral;   EYE SURGERY Right    cataract   KNEE SURGERY Left    torn meniscus   LITHOTRIPSY     MUSCLE BIOPSY     OOPHORECTOMY      Family Psychiatric History: Reviewed family psychiatric history  from progress note on 12/11/2017.  Family History:  Family History  Problem Relation Age of Onset   Migraines Mother    Anxiety disorder Mother    Depression Mother    Cancer Father    Hypertension Father    Stroke Father    Heart attack Father    Alcohol abuse Father    Diabetes Brother    Alcohol abuse Sister    Drug abuse Sister    Anxiety disorder Sister    Depression Sister    Drug abuse Brother    Cancer Paternal Aunt    Lupus Paternal Aunt    Thyroid disease Paternal Aunt    Diabetes Maternal Grandfather    Anxiety disorder Maternal Grandmother    Depression Maternal Grandmother     Social History: Reviewed social history from progress note on 12/11/2017. Social History   Socioeconomic History   Marital status: Married    Spouse name: Stacy [name]   Number of children: 2   Years of education: 14   Highest education level: Associate degree: occupational, Hotel manager, or vocational program  Occupational History   Occupation: Disabled  Tobacco Use   Smoking status: Never   Smokeless tobacco: Never  Vaping Use   Vaping Use: Never used  Substance and Sexual Activity   Alcohol use: No    Alcohol/week: 0.0 standard drinks of alcohol   Drug use: Never   Sexual activity: Yes    Birth control/protection: Surgical  Other Topics Concern   Not on file  Social History Narrative   Lives at home with husband. Stacy [name]).    Disabled.   Education college.   Right handed.   2 cups caffeine/daily   Social Determinants of Health   Financial Resource Strain: Low Risk  (12/10/2017)   Overall Financial Resource Strain (CARDIA)    Difficulty of Paying Living Expenses: Not hard at all  Food Insecurity: No Food Insecurity (12/10/2017)   Hunger Vital Sign    Worried About Running Out of Food in the Last Year: Never true    Ran Out of Food in the Last Year: Never true  Transportation Needs: No Transportation Needs (12/10/2017)   PRAPARE - Hydrologist  (Medical): No    Lack of Transportation (Non-Medical): No  Physical Activity: Sufficiently Active (12/10/2017)   Exercise Vital Sign    Days of Exercise per Week: 7 days    Minutes of Exercise per Session: 30 min  Stress: Stress Concern Present (12/10/2017)   [place]    Feeling of Stress : Very much  Social Connections: Unknown (12/10/2017)   Social Connection and Isolation Panel [NHANES]    Frequency of Communication with Friends and Family: Not on file    Frequency of Social Gatherings with Friends and Family: Not on file    Attends Religious Services: More than 4 times per year    Active Member of  Clubs or Organizations: No    Attends Archivist Meetings: Never    Marital Status: Married    Allergies:  Allergies  Allergen Reactions   Penicillins Hives   Amoxicillin Hives   Depakote [Divalproex Sodium]     vomiting   Lamictal [Lamotrigine]     Side effects of abdominal cramps, nausea , diarrhea ,nightmares    Metabolic Disorder Labs: Lab Results  Component Value Date   HGBA1C 5.0 11/13/2015   No results found for: "PROLACTIN" No results found for: "CHOL", "TRIG", "HDL", "CHOLHDL", "VLDL", "LDLCALC" Lab Results  Component Value Date   TSH 1.780 10/26/2015   TSH 0.525 03/15/2015    Therapeutic Level Labs: No results found for: "LITHIUM" Lab Results  Component Value Date   VALPROATE 10 (L) 07/28/2019   No results found for: "CBMZ"  Current Medications: Current Outpatient Medications  Medication Sig Dispense Refill   alendronate (FOSAMAX) 70 MG tablet 1 tablet 30 minutes before the first food, beverage or medicine of the day dissolved in 4 ounces of water     Ascorbic Acid (VITAMIN C) 100 MG tablet Take 100 mg by mouth daily.     cevimeline (EVOXAC) 30 MG capsule Take 30 mg by mouth 3 (three) times daily.     Cholecalciferol (VITAMIN D-3) 1000 units CAPS Take 1,000 Units by mouth  daily.     clonazePAM (KLONOPIN) 0.5 MG tablet Take 1 tablet (0.5 mg total) by mouth daily as needed for anxiety. AS NEEDED FOR SEVERE ANXIETY ATTACKS 15 tablet 2   cyclobenzaprine (FLEXERIL) 5 MG tablet Take 5 mg by mouth 3 (three) times daily as needed.     donepezil (ARICEPT) 10 MG tablet Take 10 mg by mouth every morning.     gabapentin (NEURONTIN) 300 MG capsule Take 4 capsules (1,200 mg total) by mouth as directed. Take 1 capsule daily AM , 1 capsule daily at noon and 2 capsules daily PM 360 capsule 0   hydroxychloroquine (PLAQUENIL) 200 MG tablet Take 200 mg by mouth 2 (two) times daily.     levothyroxine (SYNTHROID, LEVOTHROID) 175 MCG tablet Take 1 tablet (175 mcg total) by mouth daily before breakfast. 90 tablet 3   metroNIDAZOLE (FLAGYL) 500 MG tablet Take 500 mg by mouth 3 (three) times daily.     Multiple Vitamins-Minerals (PRESERVISION AREDS) CAPS Take 1 capsule by mouth daily.     Na Sulfate-K Sulfate-Mg Sulf 17.5-3.13-1.6 GM/177ML SOLN MIX AND DRINK AS DIRECTED     omeprazole (PRILOSEC) 40 MG capsule Take 40 mg by mouth daily.     pilocarpine (SALAGEN) 5 MG tablet Take 1 tablet by mouth 2 (two) times daily.     polyethylene glycol (MIRALAX / GLYCOLAX) 17 g packet Take 17 g by mouth daily. 14 each 0   SUMAtriptan (IMITREX) 100 MG tablet Take 100 mg by mouth every 2 (two) hours as needed for migraine.     vitamin E 45 MG (100 UNITS) capsule Take 100 Units by mouth daily.     busPIRone (BUSPAR) 10 MG tablet Take 2 tablets (20 mg total) by mouth 2 (two) times daily. 360 tablet 0   venlafaxine XR (EFFEXOR XR) 37.5 MG 24 hr capsule Take 1 capsule (37.5 mg total) by mouth daily with breakfast. Take along with 75 mg daily 90 capsule 0   venlafaxine XR (EFFEXOR-XR) 75 MG 24 hr capsule Take 1 capsule (75 mg total) by mouth daily with breakfast. Take along with 37.5 mg daily 90 capsule 0  No current facility-administered medications for this visit.     Musculoskeletal: Strength & Muscle  Tone: within normal limits Gait & Station: normal Patient leans: N/A  Psychiatric Specialty Exam: Review of Systems  Neurological:        Seizure like spells  Psychiatric/Behavioral:  The patient is nervous/anxious.   All other systems reviewed and are negative.   Blood pressure 104/66, pulse 87, temperature 98.1 F (36.7 C), temperature source Oral, height '5\' 7"'$  (1.702 m), weight 156 lb (70.8 kg).Body mass index is 24.43 kg/m.  General Appearance: Casual  Eye Contact:  Fair  Speech:  Clear and Coherent  Volume:  Normal  Mood:  Anxious  Affect:  Appropriate  Thought Process:  Goal Directed and Descriptions of Associations: Intact  Orientation:  Full (Time, Place, and Person)  Thought Content: Logical   Suicidal Thoughts:  No  Homicidal Thoughts:  No  Memory:  Immediate;   Fair Recent;   Fair Remote;   Poor  Judgement:  Fair  Insight:  Fair  Psychomotor Activity:  Normal  Concentration:  Concentration: Poor and Attention Span: Poor  Recall:  [employer] of Knowledge: Fair  Language: Fair  Akathisia:  No  Handed:  Right  AIMS (if indicated): not done  Assets:  Communication Skills Desire for Improvement Housing Intimacy Talents/Skills  ADL's:  Intact  Cognition: WNL  Sleep:  Fair   Screenings: [place] Office Visit from 10/22/2021 in [facility] Visit from 07/30/2021 in [place] Office Visit from 06/15/2018 in [place] Visit from 05/20/2018 in [place] Total Score 0 0 5 9      [place] Office Visit from 02/21/2022 in [place] from 12/26/2021 in [place] from 10/22/2021 in [place] from 05/07/2021 in [place] Visit from 04/16/2021 in  [facility]  Total GAD-7 Score '14 12 8 9 14      '$ [place] Visit from 01/01/2016 in [facility] Office Visit from 11/13/2015 in [facility] Office Visit from 10/26/2015 in [facility] Office Visit from 04/25/2015 in [facility] Office Visit from 10/25/2014 in [facility]  Total Score (max 30 points ) '25 26 21 23 29      '$ PHQ2-9    [place] Visit from 02/21/2022 in [place] from 12/26/2021 in [place] Visit from 10/22/2021 in [place] Visit from 07/30/2021 in [place] Office Visit from 05/07/2021 in [place]  PHQ-2 Total Score '2 2 1 1 2  '$ PHQ-9 Total Score 7 7 -- 3 6      [place] Office Visit from 02/21/2022 in [facility] from 01/02/2022 in [facility] Office Visit from 12/26/2021 in [facility] No Risk No Risk No Risk        Assessment and Plan: Stacy [name] is a 61 year old Caucasian female who has a history of bipolar disorder type II, neuroleptic induced Parkinson's disease, insomnia, cognitive disorder, Sjogren syndrome, hypothyroidism, was evaluated in office today.  Patient with recent seizure-like spells, currently under the care of neurology, otherwise reports mood symptoms as improving on the current  medication regimen.  Plan as noted below.  Plan Bipolar disorder in full remission Gabapentin 300 mg in the morning, 300 mg at noon and 600 mg at bedtime.  GAD-improving Gabapentin as prescribed Venlafaxine 112.75 mg p.o. daily BuSpar 20 mg p.o. twice daily Continue CBT with Ms. Stacy [name]  Primary  insomnia-stable Discontinue Lunesta for noncompliance  Mild neurocognitive disorder-chronic, seizure-like spells-patient to continue to follow-up with neurology.  Pending EEG.   Discussed with patient not to operate heavy machinery, not to drive and to have supervision while cooking until she is neurologically cleared.  Follow-up in clinic in 2 months or sooner if needed.   This note was generated in part or whole with voice recognition software. Voice recognition is usually quite accurate but there are transcription errors that can and very often do occur. I apologize for any typographical errors that were not detected and corrected.     Ursula Alert, [name] 02/21/2022, 3:34 PM

## 2022-02-22 ENCOUNTER — Ambulatory Visit (INDEPENDENT_AMBULATORY_CARE_PROVIDER_SITE_OTHER): Payer: Managed Care, Other (non HMO) | Admitting: Podiatry

## 2022-02-22 DIAGNOSIS — L600 Ingrowing nail: Secondary | ICD-10-CM | POA: Diagnosis not present

## 2022-02-22 NOTE — Progress Notes (Signed)
   Chief Complaint  Patient presents with   Follow-up    Follow up 3 weeks both feet, patient stated that she is doing very well     Subjective: 61 y.o. female presents today status post permanent nail avulsion procedure of the medial border of the right great toe as well as the medial border of the left second toe that was performed on 01/29/2022.  Patient is doing well.  She notices significant improvement and no longer has any pain associated to the ingrown toenails.   Past Medical History:  Diagnosis Date   Aneurysm ([place])    Anxiety    Arthritis    Brain stem lesion    Chronic kidney disease    kideny stone   Depression    Family history of adverse reaction to anesthesia    mother has N/V   Fibromyalgia    GERD (gastroesophageal reflux disease)    Headache    Hypotension    Hypothyroid    Memory loss    Migraine    Raynaud disease    Sjogren's syndrome (HCC)    Thyroid disease     Objective: Neurovascular status intact.  Skin is warm, dry and supple. Nail and respective nail fold appears to be healing appropriately.   Assessment: #1 s/p partial permanent nail matrixectomy medial border right great toe.  Medial border left second toe   Plan of care: #1 patient was evaluated  #2 light debridement of the periungual debris was performed to the border of the respective toe and nail plate using a tissue nipper. #3 patient is to return to clinic on a PRN basis.   Edrick Kins, DPM [facility]  Dr. Edrick Kins, DPM    [address], [place] [address]                Office [phone]  Fax [phone]

## 2022-03-20 ENCOUNTER — Ambulatory Visit (HOSPITAL_COMMUNITY): Payer: 59 | Admitting: Licensed Clinical Social Worker

## 2022-04-24 ENCOUNTER — Ambulatory Visit (HOSPITAL_COMMUNITY): Payer: 59 | Admitting: Licensed Clinical Social Worker

## 2022-04-26 ENCOUNTER — Ambulatory Visit (INDEPENDENT_AMBULATORY_CARE_PROVIDER_SITE_OTHER): Payer: 59 | Admitting: Psychiatry

## 2022-04-26 ENCOUNTER — Encounter: Payer: Self-pay | Admitting: Psychiatry

## 2022-04-26 VITALS — BP 120/76 | HR 77 | Temp 97.9°F | Ht 67.0 in | Wt 158.2 lb

## 2022-04-26 DIAGNOSIS — F5101 Primary insomnia: Secondary | ICD-10-CM

## 2022-04-26 DIAGNOSIS — F09 Unspecified mental disorder due to known physiological condition: Secondary | ICD-10-CM

## 2022-04-26 DIAGNOSIS — F3178 Bipolar disorder, in full remission, most recent episode mixed: Secondary | ICD-10-CM | POA: Diagnosis not present

## 2022-04-26 DIAGNOSIS — F411 Generalized anxiety disorder: Secondary | ICD-10-CM | POA: Diagnosis not present

## 2022-04-26 MED ORDER — GABAPENTIN 300 MG PO CAPS: 1200.0000 mg | ORAL_CAPSULE | ORAL | 0 refills | Status: DC

## 2022-04-26 MED ORDER — CLONAZEPAM 0.5 MG PO TABS: 0.2500 mg | ORAL_TABLET | Freq: Every day | ORAL | 2 refills | Status: DC | PRN

## 2022-04-26 NOTE — Progress Notes (Signed)
[facility] MD OP Progress Note  04/26/2022 10:34 AM Stacy [name]  MRN:  [id number]  Chief Complaint:  Chief Complaint  Patient presents with   Follow-up   Anxiety   Depression   Medication Refill   HPI: Stacy [name] is a 62 year old Caucasian female, unemployed, married, lives in [place], has a history of bipolar disorder type II, GAD, insomnia, mild neurocognitive disorder, neuroleptic induced Parkinson's, Sjogren syndrome, hypertension, hypothyroidism, arthritis, migraine headaches, seizure-like spells was evaluated in the office today.  Patient today reports she was recently evaluated by a neurologist, diagnosed with seizure-like spells, currently awaiting EEG report.  She was advised not to drive for the next 6 months.  She hence has been staying home more.  That does have an impact on her mood.  She reports when she does get out she feels more anxious.  She is currently trying to cope with that.  Reports her friend and her husband drives her to places.  She has not been able to go into the exercise groups that she was part of.  However a friend has agreed to start taking her soon.  She usually goes 3-4 times a week.  Patient reports sleep is overall okay.  She however reports she feels tired throughout the day.  Does not know what could be contributing to it.  Patient is currently compliant on all her medications.  Uses the clonazepam only a couple of times a week or so.  Limiting use.  Patient appeared to be alert, oriented, to person and place time and situation, 3 word memory immediate 3 out of 3, after 5 minutes 2 out of 3.  Patient had trouble with serial threes however was able to do it with support.  Patient denies any other concerns today.  Visit Diagnosis:    ICD-10-CM   1. Bipolar disorder, in full remission, most recent episode mixed ([place])  F31.78    Type 2    2. GAD (generalized anxiety disorder)  F41.1 clonazePAM (KLONOPIN) 0.5 MG tablet    gabapentin (NEURONTIN)  300 MG capsule    3. Mild cognitive disorder  F09    Currently under the care of neurology.    4. Primary insomnia  F51.01       Past Psychiatric History: I have reviewed past psychiatric history from progress note on 12/11/2017.  Past trials of Paxil, Wellbutrin, risperidone, Latuda, Prolixin, Seroquel, Abilify, Zyprexa, Invega, Topamax, Lexapro, Belsomra, Depakote, Rexulti-could not afford it.  Neuropsychological testing completed by Dr. Rodenbough-07/10/2021-not consistent with conditions like Alzheimer's, Lewy body dementia or other cortical or subcortical dementia.  Cognitive issues likely due to psychiatric problems.  Past Medical History:  Past Medical History:  Diagnosis Date   Aneurysm ([place])    Anxiety    Arthritis    Brain stem lesion    Chronic kidney disease    kideny stone   Depression    Family history of adverse reaction to anesthesia    mother has N/V   Fibromyalgia    GERD (gastroesophageal reflux disease)    Headache    Hypotension    Hypothyroid    Memory loss    Migraine    Raynaud disease    Sjogren's syndrome ([place])    Thyroid disease     Past Surgical History:  Procedure Laterality Date   ABDOMINAL HYSTERECTOMY     BASAL CELL CARCINOMA EXCISION     BREAST BIOPSY Left    [place]  WITH STENT PLACEMENT Bilateral 08/01/2020   Procedure: CYSTOSCOPY WITH STENT PLACEMENT;  Surgeon: Hollice Espy, MD;  Location: [facility] OR[facility];  Service: Urology;  Laterality: Bilateral;   CYSTOSCOPY/URETEROSCOPY/HOLMIUM LASER/STENT PLACEMENT Bilateral 08/14/2020   Procedure: CYSTOSCOPY/URETEROSCOPY/HOLMIUM LASER/STENT PLACEMENT;  Surgeon: Hollice Espy, MD;  Location: [facility];  Service: Urology;  Laterality: Bilateral;   EYE SURGERY Right    cataract   KNEE SURGERY Left    torn meniscus   LITHOTRIPSY     MUSCLE BIOPSY     OOPHORECTOMY      Family Psychiatric History: Reviewed family psychiatric history from progress note on  12/11/2017.  Family History:  Family History  Problem Relation Age of Onset   Migraines Mother    Anxiety disorder Mother    Depression Mother    Cancer Father    Hypertension Father    Stroke Father    Heart attack Father    Alcohol abuse Father    Diabetes Brother    Alcohol abuse Sister    Drug abuse Sister    Anxiety disorder Sister    Depression Sister    Drug abuse Brother    Cancer Paternal Aunt    Lupus Paternal Aunt    Thyroid disease Paternal Aunt    Diabetes Maternal Grandfather    Anxiety disorder Maternal Grandmother    Depression Maternal Grandmother     Social History: Reviewed social history from progress note on 12/11/2017. Social History   Socioeconomic History   Marital status: Married    Spouse name: [name]   Number of children: 2   Years of education: 14   Highest education level: Associate degree: occupational, Hotel manager, or vocational program  Occupational History   Occupation: Disabled  Tobacco Use   Smoking status: Never   Smokeless tobacco: Never  Vaping Use   Vaping Use: Never used  Substance and Sexual Activity   Alcohol use: No    Alcohol/week: 0.0 standard drinks of alcohol   Drug use: Never   Sexual activity: Yes    Birth control/protection: Surgical  Other Topics Concern   Not on file  Social History Narrative   Lives at home with husband. [name]).    Disabled.   Education college.   Right handed.   2 cups caffeine/daily   Social Determinants of Health   Financial Resource Strain: Low Risk  (12/10/2017)   Overall Financial Resource Strain (CARDIA)    Difficulty of Paying Living Expenses: Not hard at all  Food Insecurity: No Food Insecurity (12/10/2017)   Hunger Vital Sign    Worried About Running Out of Food in the Last Year: Never true    Ran Out of Food in the Last Year: Never true  Transportation Needs: No Transportation Needs (12/10/2017)   PRAPARE - Hydrologist (Medical): No     Lack of Transportation (Non-Medical): No  Physical Activity: Sufficiently Active (12/10/2017)   Exercise Vital Sign    Days of Exercise per Week: 7 days    Minutes of Exercise per Session: 30 min  Stress: Stress Concern Present (12/10/2017)   [place]    Feeling of Stress : Very much  Social Connections: Unknown (12/10/2017)   Social Connection and Isolation Panel [NHANES]    Frequency of Communication with Friends and Family: Not on file    Frequency of Social Gatherings with Friends and Family: Not on file    Attends Religious Services: More than 4 times per  year    Active Member of Clubs or Organizations: No    Attends Archivist Meetings: Never    Marital Status: Married    Allergies:  Allergies  Allergen Reactions   Penicillins Hives   Amoxicillin Hives   Depakote [Divalproex Sodium]     vomiting   Lamictal [Lamotrigine]     Side effects of abdominal cramps, nausea , diarrhea ,nightmares    Metabolic Disorder Labs: Lab Results  Component Value Date   HGBA1C 5.0 11/13/2015   No results found for: "PROLACTIN" No results found for: "CHOL", "TRIG", "HDL", "CHOLHDL", "VLDL", "LDLCALC" Lab Results  Component Value Date   TSH 1.780 10/26/2015   TSH 0.525 03/15/2015    Therapeutic Level Labs: No results found for: "LITHIUM" Lab Results  Component Value Date   VALPROATE 10 (L) 07/28/2019   No results found for: "CBMZ"  Current Medications: Current Outpatient Medications  Medication Sig Dispense Refill   alendronate (FOSAMAX) 70 MG tablet 1 tablet 30 minutes before the first food, beverage or medicine of the day dissolved in 4 ounces of water     Ascorbic Acid (VITAMIN C) 100 MG tablet Take 100 mg by mouth daily.     busPIRone (BUSPAR) 10 MG tablet Take 2 tablets (20 mg total) by mouth 2 (two) times daily. 360 tablet 0   cevimeline (EVOXAC) 30 MG capsule Take 30 mg by mouth 3 (three) times  daily.     Cholecalciferol (VITAMIN D-3) 1000 units CAPS Take 1,000 Units by mouth daily.     cyclobenzaprine (FLEXERIL) 5 MG tablet Take 5 mg by mouth 3 (three) times daily as needed.     donepezil (ARICEPT) 10 MG tablet Take 10 mg by mouth every morning.     hydroxychloroquine (PLAQUENIL) 200 MG tablet Take 200 mg by mouth 2 (two) times daily.     levothyroxine (SYNTHROID) 200 MCG tablet Take 200 mcg by mouth daily.     metroNIDAZOLE (FLAGYL) 500 MG tablet Take 500 mg by mouth 3 (three) times daily.     Multiple Vitamins-Minerals (PRESERVISION AREDS) CAPS Take 1 capsule by mouth daily.     Na Sulfate-K Sulfate-Mg Sulf 17.5-3.13-1.6 GM/177ML SOLN MIX AND DRINK AS DIRECTED     omeprazole (PRILOSEC) 40 MG capsule Take 40 mg by mouth daily.     pilocarpine (SALAGEN) 5 MG tablet Take 1 tablet by mouth 2 (two) times daily.     polyethylene glycol (MIRALAX / GLYCOLAX) 17 g packet Take 17 g by mouth daily. 14 each 0   SUMAtriptan (IMITREX) 100 MG tablet Take 100 mg by mouth every 2 (two) hours as needed for migraine.     venlafaxine XR (EFFEXOR XR) 37.5 MG 24 hr capsule Take 1 capsule (37.5 mg total) by mouth daily with breakfast. Take along with 75 mg daily 90 capsule 0   venlafaxine XR (EFFEXOR-XR) 75 MG 24 hr capsule Take 1 capsule (75 mg total) by mouth daily with breakfast. Take along with 37.5 mg daily 90 capsule 0   vitamin E 45 MG (100 UNITS) capsule Take 100 Units by mouth daily.     clonazePAM (KLONOPIN) 0.5 MG tablet Take 0.5-1 tablets (0.25-0.5 mg total) by mouth daily as needed for anxiety. AS NEEDED FOR SEVERE ANXIETY ATTACKS 21 tablet 2   gabapentin (NEURONTIN) 300 MG capsule Take 4 capsules (1,200 mg total) by mouth as directed. Take 1 capsule daily AM , 1 capsule daily at noon and 2 capsules daily PM 360 capsule 0  No current facility-administered medications for this visit.     Musculoskeletal: Strength & Muscle Tone: within normal limits Gait & Station: normal Patient leans:  N/A  Psychiatric Specialty Exam: Review of Systems  Psychiatric/Behavioral:  The patient is nervous/anxious.   All other systems reviewed and are negative.   Blood pressure 120/76, pulse 77, temperature 97.9 F (36.6 C), temperature source Temporal, height '5\' 7"'$  (1.702 m), weight 158 lb 3.2 oz (71.8 kg), SpO2 99 %.Body mass index is 24.78 kg/m.  General Appearance: Casual  Eye Contact:  Fair  Speech:  Clear and Coherent  Volume:  Normal  Mood:  Anxious  Affect:  Congruent  Thought Process:  Goal Directed and Descriptions of Associations: Intact  Orientation:  Full (Time, Place, and Person)  Thought Content: Logical   Suicidal Thoughts:  No  Homicidal Thoughts:  No  Memory:  Immediate;   Fair Recent;   Fair Remote;   Fair  Judgement:  Fair  Insight:  Fair  Psychomotor Activity:  Normal  Concentration:  Concentration: Fair and Attention Span: Fair  Recall:  [employer] of Knowledge: Fair  Language: Fair  Akathisia:  No  Handed:  Right  AIMS (if indicated): not done  Assets:  Communication Skills Desire for Improvement Housing Intimacy Social Support  ADL's:  Intact  Cognition: WNL  Sleep:  Fair   Screenings: [place] Visit from 10/22/2021 in [facility] Office Visit from 07/30/2021 in [facility] Office Visit from 06/15/2018 in [facility] Office Visit from 05/20/2018 in [facility] Total Score 0 0 5 [place] Office Visit from 04/26/2022 in [facility] Office Visit from 02/21/2022 in [facility] Office Visit from 12/26/2021 in [facility] Office Visit from 10/22/2021 in [facility] Office Visit from  05/07/2021 in [facility]  Total GAD-7 Score '14 14 12 8 9      '$ [place] Office Visit from 01/01/2016 in [facility] Office Visit from 11/13/2015 in [facility] Office Visit from 10/26/2015 in [facility] Office Visit from 04/25/2015 in [facility] Office Visit from 10/25/2014 in [facility]  Total Score (max 30 points ) '25 26 21 23 29      '$ PHQ2-9    [place] Visit from 04/26/2022 in [facility] Office Visit from 02/21/2022 in [facility] Office Visit from 12/26/2021 in [facility] Office Visit from 10/22/2021 in [facility] Office Visit from 07/30/2021 in [facility]  PHQ-2 Total Score '2 2 2 1 1  '$ PHQ-9 Total Score '5 7 7 '$ -- 3      [place] Office Visit from 04/26/2022 in [facility] Office Visit from 02/21/2022 in [place] from 01/02/2022 in [facility] No Risk No Risk No Risk        Assessment and Plan: Rozelyn Machnik is a 62 year old Caucasian female who has a history of bipolar disorder  type II, neuroleptic induced Parkinson's disease, insomnia, cognitive disorder, Sjogren's syndrome, hypothyroidism was evaluated in the office today.  Patient with recent seizure-like spells, currently awaiting EEG report, continues to have anxiety, will benefit from following plan.  Plan Bipolar disorder in full remission Gabapentin 300 mg p.o. daily in the morning, 300 mg p.o. daily at noon, 600 mg p.o. daily at  bedtime.  GAD-unstable Gabapentin as prescribed Venlafaxine 112.75 mg p.o. daily BuSpar 20 mg p.o. twice daily Clonazepam 0.25 0.5 mg p.o. daily as needed-patient advised to make use of it more frequently for the next few weeks and she is currently struggling with significant anxiety as well as going through testing. Continue CBT with Ms. [place] PMP AWARxE   Primary insomnia-stable Will monitor closely.  Patient to work on sleep hygiene.  Mild neurocognitive disorder-chronic-seizure-like spells-review notes per Dr. Shah-03/25/2022-recommend not driving if having spells.  EEG-scheduled for 04/14/2022.  Also ordered labs like CBC, CMP, vitamin B12, vitamin B1, vitamin D, folate, Treponema pallidum screening [place], TSH.  ATN profile-beta myeloid 42/40 ratio,Ta[place] 181.  1 09/[birth year]1978 Mark-early onset Alzheimer's evaluation. Patient advised to continue Aricept 10 mg p.o. daily.   Follow-up in clinic in 1 month or sooner if needed.   Collaboration of Care: Collaboration of Care: Other review notes per neurology.  Patient/Guardian was advised Release of Information must be obtained prior to any record release in order to collaborate their care with an outside provider. Patient/Guardian was advised if they have not already done so to contact the registration department to sign all necessary forms in order for [place] to release information regarding their care.   Consent: Patient/Guardian gives verbal consent for treatment and assignment of benefits for services provided during this visit. Patient/Guardian expressed understanding and agreed to proceed.   This note was generated in part or whole with voice recognition software. Voice recognition is usually quite accurate but there are transcription errors that can and very often do occur. I apologize for any typographical errors that were not detected and corrected.    Ursula Alert, MD 04/26/2022, 10:34 AM

## 2022-05-23 ENCOUNTER — Ambulatory Visit (HOSPITAL_COMMUNITY): Payer: 59 | Admitting: Licensed Clinical Social Worker

## 2022-05-27 ENCOUNTER — Telehealth: Payer: 59 | Admitting: Psychiatry

## 2022-05-29 ENCOUNTER — Ambulatory Visit (INDEPENDENT_AMBULATORY_CARE_PROVIDER_SITE_OTHER): Payer: 59 | Admitting: Licensed Clinical Social Worker

## 2022-05-29 DIAGNOSIS — F3177 Bipolar disorder, in partial remission, most recent episode mixed: Secondary | ICD-10-CM

## 2022-05-29 DIAGNOSIS — F411 Generalized anxiety disorder: Secondary | ICD-10-CM | POA: Diagnosis not present

## 2022-05-29 NOTE — Progress Notes (Signed)
Virtual Visit via Video Note  I connected with Stacy [name] on 05/29/22 at  9:00 AM EDT by a video enabled telemedicine application and verified that I am speaking with the correct person using two identifiers.  Location: Patient: home Provider: [facility]   I discussed the limitations of evaluation and management by telemedicine and the availability of in person appointments. The patient expressed understanding and agreed to proceed.  I discussed the assessment and treatment plan with the patient. The patient was provided an opportunity to ask questions and all were answered. The patient agreed with the plan and demonstrated an understanding of the instructions.   The patient was advised to call back or seek an in-person evaluation if the symptoms worsen or if the condition fails to improve as anticipated.  I provided 35 minutes of non-face-to-face time during this encounter.   Doris Mcgilvery R Kelcey Wickstrom, LCSW  THERAPIST PROGRESS NOTE  Session Time: 9:15-9:50a  Participation Level: Active  Behavioral Response: Neat and Well GroomedAlertAnxious and Depressed  Type of Therapy: Individual Therapy  Treatment Goals addressed:    Problem: Bipolar Disorder CCP Problem  1 Alleviate depressive/manic symptoms and return to improved levels of effective functioning per self report 3 out of 5 sessions documented Goal: LTG: Stabilize mood and increase goal-directed behavior: Input needed on appropriate metric Outcome: Progressing Goal: STG: Reduce frequency, intensity, and duration of depression symptoms as evidenced by pt self report 3 out of 5 sessions documented Outcome: Progressing Intervention: Discuss self-management skills Note: Reviewed  Intervention: Encourage verbalization of feelings/concerns/expectations Note: Allowed/expressed:  pt expressed an increase in anxiety--reviewed anxiety coping skills Intervention: Assist with relaxation techniques,  as appropriate (deep breathing exercises, meditation, guided imagery) Note: Reviewed      ProgressTowards Goals: Progressing  Interventions: CBT and Supportive  Summary: Stacy [name] is a 62 y.o. female who presents with  continuing symptoms related to mood disorder and anxiety.  Patient reports that her overall mood has been stable, but she feels that she continues to experience anxiety triggered by environmental situations.  Patient states that she is compliant with her medication and that she is getting good quality and quantity of sleep.  Patient reports that her appetite is within normal limits.  Patient reports that she recently had a panic attack triggered when she tried to walk from her home to [place].  Patient reports that she was able to manage her symptoms and was able to walk back home.  Patient states she recently had an appointment with the neurologist, and a determination was made that patient is having mini seizures.  The neurologist made the decision to take patient's license away from her.  Patient states that there was a negative psychological impact of this, as she feels that now she is completely dependent on her husband for transportation.  Patient states that she also had an EKG that flagged as abnormal, so she is currently under the care of a cardiologist.  Patient states that she is also making erratic, abnormal behaviors-patient states that she was cooking 1 day and poured iced tea into the frying pan instead of oil.  Patient states that she does have a friend that will come and pick her up 1 time a week and they will go shopping or go to lunch together.  Patient reports that this is good positive social engagement and she enjoys the visits.  Clinician assisted patient in exploring and resolving her thoughts and feelings triggered by a new results of medical conditions  and treatments.  Encouraged patient to be as active as possible, and to continue making  healthy choices nutritionally.  Encouraged patient to write down any concerning behaviors or memory lapses to share with her primary care physician or neurologist.  Continued recommendations are as follows: self care behaviors, positive social engagements, focusing on overall work/home/life balance, and focusing on positive physical and emotional wellness.   Suicidal/Homicidal: No  Therapist Response: Pt is continuing to apply interventions learned in session into daily life situations. Pt is currently on track to meet goals utilizing interventions mentioned above. Personal growth and progress noted. Treatment to continue as indicated.   Plan: Return again in 4 weeks.  Diagnosis:  Encounter Diagnoses  Name Primary?   Bipolar disorder, in partial remission, most recent episode mixed Yes   GAD (generalized anxiety disorder)     Collaboration of Care: Community Stakeholder(s) AEB Pt requested in-person  and Other Pt encouraged to continue services with psychiatrist of record, Dr. Ursula Alert  Patient/Guardian was advised Release of Information must be obtained prior to any record release in order to collaborate their care with an outside provider. Patient/Guardian was advised if they have not already done so to contact the registration department to sign all necessary forms in order for [place] to release information regarding their care.   Consent: Patient/Guardian gives verbal consent for treatment and assignment of benefits for services provided during this visit. Patient/Guardian expressed understanding and agreed to proceed.   [place], LCSW 05/29/2022

## 2022-05-30 ENCOUNTER — Telehealth (INDEPENDENT_AMBULATORY_CARE_PROVIDER_SITE_OTHER): Payer: 59 | Admitting: Psychiatry

## 2022-05-30 ENCOUNTER — Other Ambulatory Visit: Payer: Self-pay | Admitting: Psychiatry

## 2022-05-30 DIAGNOSIS — F3178 Bipolar disorder, in full remission, most recent episode mixed: Secondary | ICD-10-CM

## 2022-05-30 DIAGNOSIS — F3177 Bipolar disorder, in partial remission, most recent episode mixed: Secondary | ICD-10-CM

## 2022-05-30 DIAGNOSIS — F411 Generalized anxiety disorder: Secondary | ICD-10-CM

## 2022-05-30 MED ORDER — VENLAFAXINE HCL ER 75 MG PO CP24: 75.0000 mg | ORAL_CAPSULE | Freq: Every day | ORAL | 0 refills | Status: DC

## 2022-05-30 MED ORDER — VENLAFAXINE HCL ER 37.5 MG PO CP24: 37.5000 mg | ORAL_CAPSULE | Freq: Every day | ORAL | 0 refills | Status: DC

## 2022-05-30 MED ORDER — BUSPIRONE HCL 10 MG PO TABS
20.0000 mg | ORAL_TABLET | Freq: Two times a day (BID) | ORAL | 0 refills | Status: DC
Start: 2022-05-30 — End: 2022-10-04

## 2022-05-30 NOTE — Progress Notes (Signed)
Unable to complete this visit due to connection problem.  I have communicated with staff to schedule this patient for another appointment.

## 2022-06-10 ENCOUNTER — Ambulatory Visit (HOSPITAL_COMMUNITY): Payer: 59 | Admitting: Licensed Clinical Social Worker

## 2022-06-24 ENCOUNTER — Telehealth (HOSPITAL_COMMUNITY): Payer: Self-pay | Admitting: Psychiatry

## 2022-06-24 ENCOUNTER — Encounter: Payer: Self-pay | Admitting: Psychiatry

## 2022-06-24 ENCOUNTER — Telehealth: Payer: Self-pay | Admitting: Psychiatry

## 2022-06-24 ENCOUNTER — Ambulatory Visit (INDEPENDENT_AMBULATORY_CARE_PROVIDER_SITE_OTHER): Payer: 59 | Admitting: Psychiatry

## 2022-06-24 VITALS — BP 110/69 | HR 76 | Temp 97.2°F | Ht 67.0 in | Wt 159.0 lb

## 2022-06-24 DIAGNOSIS — F411 Generalized anxiety disorder: Secondary | ICD-10-CM

## 2022-06-24 DIAGNOSIS — F5101 Primary insomnia: Secondary | ICD-10-CM | POA: Diagnosis not present

## 2022-06-24 DIAGNOSIS — F3181 Bipolar II disorder: Secondary | ICD-10-CM | POA: Diagnosis not present

## 2022-06-24 DIAGNOSIS — F09 Unspecified mental disorder due to known physiological condition: Secondary | ICD-10-CM

## 2022-06-24 MED ORDER — GABAPENTIN 400 MG PO CAPS
400.0000 mg | ORAL_CAPSULE | ORAL | 1 refills | Status: DC
Start: 2022-06-24 — End: 2022-06-24

## 2022-06-24 MED ORDER — GABAPENTIN 400 MG PO CAPS: 1600.0000 mg | ORAL_CAPSULE | ORAL | 1 refills | Status: DC

## 2022-06-24 NOTE — Progress Notes (Unsigned)
BH MD OP Progress Note  06/24/2022 10:06 AM Stacy [name]  MRN:  [id number]  Chief Complaint:  Chief Complaint  Patient presents with   Follow-up   Anxiety   Depression   Memory Loss   Medication Refill   HPI: Stacy [name] is a 61 year old Caucasian female, unemployed, [place] in [place], married, has a history of bipolar disorder type II, GAD, insomnia, mild neurocognitive disorder, neuroleptic induced Parkinson's, Sjogren syndrome, hypertension, hypothyroidism, arthritis, migraine headaches, seizure-like spells was evaluated in office today.  Patient today appeared to be extremely depressed, tearful throughout the session.  Patient reports multiple psychosocial stressors including recent health issues.  She also reports her primary care provider declined filling out her disability form and hence that has been stressful since she is running out of time and has only 2 more days left to meet the deadline.  Patient reports she does not believe she can return to work at this time since she continues to struggle with a lot of memory issues.  Patient also reports sadness, hopelessness, low motivation, low energy, concentration problems, crying spells.  Patient reports anxiety symptoms, feeling overwhelmed often, as well as having anxiety attacks.  She has been taking the clonazepam on a regular basis at this time which helps to some extent.  Reports sleep was restless.  She reports she is having nightmares which are vivid which does have an impact on her sleep.  Likely due to recent stressors.  Patient denies any suicidality, homicidality or perceptual disturbances.  Patient today appeared to be alert, oriented to person and place situation.    Patient is currently compliant on medications, denies side effects.  Agreeable to dosage increase of gabapentin.  Patient also agreeable to referral to MH IOP.  Visit Diagnosis:    ICD-10-CM   1. Bipolar 2 disorder, major depressive episode  (HCC)  F31.81    Moderate    2. GAD (generalized anxiety disorder)  F41.1 gabapentin (NEURONTIN) 400 MG capsule    DISCONTINUED: gabapentin (NEURONTIN) 400 MG capsule    3. Mild cognitive disorder  F09    Currently under the care of neurology    4. Primary insomnia  F51.01       Past Psychiatric History: I have reviewed past psychiatric history from progress note on 12/11/2017.  Past trials of Paxil, Wellbutrin, risperidone, Latuda, Prolixin, Seroquel, Abilify, Zyprexa, Invega, Topamax, Lexapro, Belsomra, Depakote, Rexulti-could not afford it.  Neuropsychological testing completed by Dr. Rodenbough-07/10/2021-not consistent with conditions like Alzheimer's, Lewy body dementia or other particular subcortical dementia.  Cognitive issues likely due to psychiatric problems.  Past Medical History:  Past Medical History:  Diagnosis Date   Aneurysm (HCC)    Anxiety    Arthritis    Brain stem lesion    Chronic kidney disease    kideny stone   Depression    Family history of adverse reaction to anesthesia    mother has N/V   Fibromyalgia    GERD (gastroesophageal reflux disease)    Headache    Hypotension    Hypothyroid    Memory loss    Migraine    Raynaud disease    Sjogren's syndrome (HCC)    Thyroid disease     Past Surgical History:  Procedure Laterality Date   ABDOMINAL HYSTERECTOMY     BASAL CELL CARCINOMA EXCISION     BREAST BIOPSY Left    CESAREAN SECTION     1982, 1984   CYSTOSCOPY WITH STENT PLACEMENT Bilateral 08/01/2020   Procedure:  CYSTOSCOPY WITH STENT PLACEMENT;  Surgeon: Vanna Scotland, MD;  Location: [facility] ORS;  Service: Urology;  Laterality: Bilateral;   CYSTOSCOPY/URETEROSCOPY/HOLMIUM LASER/STENT PLACEMENT Bilateral 08/14/2020   Procedure: CYSTOSCOPY/URETEROSCOPY/HOLMIUM LASER/STENT PLACEMENT;  Surgeon: Vanna Scotland, MD;  Location: [facility] OR[facility];  Service: Urology;  Laterality: Bilateral;   EYE SURGERY Right    cataract   KNEE SURGERY Left    torn meniscus    LITHOTRIPSY     MUSCLE BIOPSY     OOPHORECTOMY      Family Psychiatric History: I have reviewed family psychiatric history from progress note on 12/11/2017.  Family History:  Family History  Problem Relation Age of Onset   Migraines Mother    Anxiety disorder Mother    Depression Mother    Cancer Father    Hypertension Father    Stroke Father    Heart attack Father    Alcohol abuse Father    Diabetes Brother    Alcohol abuse Sister    Drug abuse Sister    Anxiety disorder Sister    Depression Sister    Drug abuse Brother    Cancer Paternal Aunt    Lupus Paternal Aunt    Thyroid disease Paternal Aunt    Diabetes Maternal Grandfather    Anxiety disorder Maternal Grandmother    Depression Maternal Grandmother     Social History: Reviewed social history from progress note on 12/11/2017. Social History   Socioeconomic History   Marital status: Married    Spouse name: Maisie Fus   Number of children: 2   Years of education: 14   Highest education level: Associate degree: occupational, Scientist, product/process development, or vocational program  Occupational History   Occupation: Disabled  Tobacco Use   Smoking status: Never   Smokeless tobacco: Never  Vaping Use   Vaping Use: Never used  Substance and Sexual Activity   Alcohol use: No    Alcohol/week: 0.0 standard drinks of alcohol   Drug use: Never   Sexual activity: Yes    Birth control/protection: Surgical  Other Topics Concern   Not on file  Social History Narrative   Lives at home with husband. Maisie Fus).    Disabled.   Education college.   Right handed.   2 cups caffeine/daily   Social Determinants of Health   Financial Resource Strain: Low Risk  (12/10/2017)   Overall Financial Resource Strain (CARDIA)    Difficulty of Paying Living Expenses: Not hard at all  Food Insecurity: No Food Insecurity (12/10/2017)   Hunger Vital Sign    Worried About Running Out of Food in the Last Year: Never true    Ran Out of Food in the Last  Year: Never true  Transportation Needs: No Transportation Needs (12/10/2017)   PRAPARE - Administrator, Civil Service (Medical): No    Lack of Transportation (Non-Medical): No  Physical Activity: Sufficiently Active (12/10/2017)   Exercise Vital Sign    Days of Exercise per Week: 7 days    Minutes of Exercise per Session: 30 min  Stress: Stress Concern Present (12/10/2017)   [employer] of Occupational Health - Occupational Stress Questionnaire    Feeling of Stress : Very much  Social Connections: Unknown (12/10/2017)   Social Connection and Isolation Panel [NHANES]    Frequency of Communication with Friends and Family: Not on file    Frequency of Social Gatherings with Friends and Family: Not on file    Attends Religious Services: More than 4 times per year    Active Member  of Clubs or Organizations: No    Attends Banker Meetings: Never    Marital Status: Married    Allergies:  Allergies  Allergen Reactions   Penicillins Hives   Amoxicillin Hives   Depakote [Divalproex Sodium]     vomiting   Lamictal [Lamotrigine]     Side effects of abdominal cramps, nausea , diarrhea ,nightmares    Metabolic Disorder Labs: Lab Results  Component Value Date   HGBA1C 5.0 11/13/2015   No results found for: "PROLACTIN" No results found for: "CHOL", "TRIG", "HDL", "CHOLHDL", "VLDL", "LDLCALC" Lab Results  Component Value Date   TSH 1.780 10/26/2015   TSH 0.525 03/15/2015    Therapeutic Level Labs: No results found for: "LITHIUM" Lab Results  Component Value Date   VALPROATE 10 (L) 07/28/2019   No results found for: "CBMZ"  Current Medications: Current Outpatient Medications  Medication Sig Dispense Refill   alendronate (FOSAMAX) 70 MG tablet 1 tablet 30 minutes before the first food, beverage or medicine of the day dissolved in 4 ounces of water     Ascorbic Acid (VITAMIN C) 100 MG tablet Take 100 mg by mouth daily.     busPIRone (BUSPAR) 10 MG  tablet Take 2 tablets (20 mg total) by mouth 2 (two) times daily. 360 tablet 0   cevimeline (EVOXAC) 30 MG capsule Take 30 mg by mouth 3 (three) times daily.     Cholecalciferol (VITAMIN D-3) 1000 units CAPS Take 1,000 Units by mouth daily.     clonazePAM (KLONOPIN) 0.5 MG tablet Take 0.5-1 tablets (0.25-0.5 mg total) by mouth daily as needed for anxiety. AS NEEDED FOR SEVERE ANXIETY ATTACKS 21 tablet 2   cyclobenzaprine (FLEXERIL) 5 MG tablet Take 5 mg by mouth 3 (three) times daily as needed.     donepezil (ARICEPT) 10 MG tablet Take 10 mg by mouth every morning.     hydroxychloroquine (PLAQUENIL) 200 MG tablet Take 200 mg by mouth 2 (two) times daily.     levothyroxine (SYNTHROID) 200 MCG tablet Take 200 mcg by mouth daily.     metroNIDAZOLE (FLAGYL) 500 MG tablet Take 500 mg by mouth 3 (three) times daily.     Multiple Vitamins-Minerals (PRESERVISION AREDS) CAPS Take 1 capsule by mouth daily.     Na Sulfate-K Sulfate-Mg Sulf 17.5-3.13-1.6 GM/177ML SOLN MIX AND DRINK AS DIRECTED     omeprazole (PRILOSEC) 40 MG capsule Take 40 mg by mouth daily.     pilocarpine (SALAGEN) 5 MG tablet Take 1 tablet by mouth 2 (two) times daily.     polyethylene glycol (MIRALAX / GLYCOLAX) 17 g packet Take 17 g by mouth daily. 14 each 0   SUMAtriptan (IMITREX) 100 MG tablet Take 100 mg by mouth every 2 (two) hours as needed for migraine.     venlafaxine XR (EFFEXOR XR) 37.5 MG 24 hr capsule Take 1 capsule (37.5 mg total) by mouth daily with breakfast. Take along with 75 mg daily 90 capsule 0   venlafaxine XR (EFFEXOR-XR) 75 MG 24 hr capsule Take 1 capsule (75 mg total) by mouth daily with breakfast. Take along with 37.5 mg daily 90 capsule 0   vitamin E 45 MG (100 UNITS) capsule Take 100 Units by mouth daily.     gabapentin (NEURONTIN) 400 MG capsule Take 4 capsules (1,600 mg total) by mouth as directed. Take 1 capsule daily AM and 1 capsule daily at noon and 2 capsules daily at bedtime 120 capsule 1   No current  facility-administered  medications for this visit.     Musculoskeletal: Strength & Muscle Tone: within normal limits Gait & Station: normal Patient leans: N/A  Psychiatric Specialty Exam: Review of Systems  Unable to perform ROS: Psychiatric disorder    Blood pressure 110/69, pulse 76, temperature (!) 97.2 F (36.2 C), height 5\' 7"  (1.702 m), weight 159 lb (72.1 kg).Body mass index is 24.9 kg/m.  General Appearance: Casual  Eye Contact:  Poor  Speech:  Slow  Volume:  Decreased  Mood:  Anxious and Depressed  Affect:  Tearful  Thought Process:  Linear and Descriptions of Associations: Intact  Orientation:  Full (Time, Place, and Person)  Thought Content: Rumination   Suicidal Thoughts:  No  Homicidal Thoughts:  No  Memory:  Immediate;   Fair Recent;   Fair Remote;   Poor  Judgement:  Fair  Insight:  Fair  Psychomotor Activity:  Restlessness  Concentration:  Concentration: Poor and Attention Span: Poor  Recall:  Poor  Fund of Knowledge: Fair  Language: Fair  Akathisia:  No  Handed:  Right  AIMS (if indicated): not done  Assets:  Communication Skills Desire for Improvement Housing Intimacy Social Support  ADL's:  Intact  Cognition: limited   Sleep:  Poor   Screenings: Geneticist, molecular Office Visit from 10/22/2021 in [facility] Office Visit from 07/30/2021 in [facility] Office Visit from 06/15/2018 in [facility] Office Visit from 05/20/2018 in [facility]  AIMS Total Score 0 0 5 9      GAD-7    Flowsheet Row Office Visit from 04/26/2022 in [facility] Office Visit from 02/21/2022 in [facility] Office Visit from 12/26/2021 in [facility] Office Visit from 10/22/2021 in [facility] Office Visit from 05/07/2021 in [facility]  Total GAD-7 Score 14 14 12 8 9       Mini-Mental    Flowsheet Row Office Visit from 01/01/2016 in [facility] Office Visit from 11/13/2015 in [facility] Office Visit from 10/26/2015 in [facility] Office Visit from 04/25/2015 in [facility] Office Visit from 10/25/2014 in [facility]  Total Score (max 30 points ) 25 26 21 23 29       PHQ2-9    Flowsheet Row Office Visit from 04/26/2022 in [facility] Office Visit from 02/21/2022 in [facility] Office Visit from 12/26/2021 in [facility] Office Visit from 10/22/2021 in [facility] Office Visit from 07/30/2021 in [facility]  PHQ-2 Total Score 2 2 2 1 1   PHQ-9 Total Score 5 7 7  -- 3      Flowsheet Row Office Visit from 04/26/2022 in [facility] Office Visit from 02/21/2022 in [facility] Counselor from 01/02/2022 in [facility] RISK CATEGORY No Risk No Risk No Risk        Assessment and Plan: Stacy [name] is a 62 year old Caucasian female who has a history of bipolar disorder type II, neuroleptic induced Parkinson's disease, insomnia, cognitive disorder likely mild, Sjogren syndrome, hypothyroidism was evaluated in office today.  Patient today presents with worsening mood symptoms, including depression, anxiety, sleep problems, will benefit from the following plan.  Plan Bipolar disorder type II depressed-unstable Will refer for MH IOP. I  have communicated with Ms. Jeri Modena in [place], [place] health Increase gabapentin to 400 mg p.o. daily in the morning, 400 mg p.o. daily at noon and 800 mg p.o. daily at bedtime.   GAD-unstable Venlafaxine 112.75 mg p.o. daily Increased to gabapentin as noted above BuSpar 20 mg p.o. twice daily Clonazepam 0.25-0.5 mg p.o. daily as needed Reviewed [place] PMP AWARxE Patient aware of long-term side effects of benzodiazepines. Patient to continue CBT-provided resources in the community to establish care with a new therapist In the meantime patient to attend MH IOP due to worsening mood symptoms.  Primary insomnia-unstable Gabapentin increased as noted above. Will consider adding a sleep medication in the future as needed   Patient with history of cognitive disorder, seizure-like spells-currently under the care of Dr. Shah-neurologist.  Patient was advised to continue Aricept 10 mg p.o. daily     Collaboration of Care: Collaboration of Care: [facility] OP Collaboration of Care:[id number]}  Patient/Guardian was advised Release of Information must be obtained prior to any record release in order to collaborate their care with an outside provider. Patient/Guardian was advised if they have not already done so to contact the registration department to sign all necessary forms in order for [place] to release information regarding their care.   Consent: Patient/Guardian gives verbal consent for treatment and assignment of benefits for services provided during this visit. Patient/Guardian expressed understanding and agreed to proceed.    Jomarie Longs, MD 06/24/2022, 10:06 AM

## 2022-06-24 NOTE — Patient Instructions (Signed)
  www.[email or web address].org  www.psychologytoday  [facility] [email or web address] [address], [place], [place] [address]  ~20.2 mi [phone]  [facility] [email or web address] [address], [place], [place] [address]  ~20.9 mi [phone]   [facility] - [phone]  [facility] - [phone]  [facility] - [phone]   [facility] - [phone]  [facility] - [phone]   [email or web address] this clinician can offer telehealth and has a sliding scale option  [email or web address].[facility].org/ this group also offers sliding scale rates and is based out of   Dr. Keisha Rogers with the [facility] specializes in divorce  [facility] has interns who offer sliding scale rates and some of the full time clinicians do, as well. You complete their contact form on their website and the referrals coordinator will help to get connected to someone   

## 2022-06-24 NOTE — Telephone Encounter (Signed)
I have completed disability form for this patient will have Shanda Bumps CMA fax it to her company -will need that information from patient.  Jessica CMA to contact.

## 2022-06-24 NOTE — Telephone Encounter (Signed)
D:  Dr. Elna Breslow referred pt to MH-IOP.  A: Placed call and oriented pt.  Pt states she doesn't do well with technology.  Case manager informed pt that she could use her cell phone and call in but would need to turn video on.  Provided pt with Garen Lah and [facility]'s phone numbers, who has in person groups.  Pt agreed to call her insurance company and call the case manager back on 06-28-22 to let her know what she plans to do.  Inform Dr. Elna Breslow.

## 2022-07-01 NOTE — Telephone Encounter (Signed)
This was already faxed and confirmed and put in the scan back

## 2022-07-04 ENCOUNTER — Ambulatory Visit (HOSPITAL_COMMUNITY): Payer: 59 | Admitting: Licensed Clinical Social Worker

## 2022-07-16 ENCOUNTER — Ambulatory Visit (HOSPITAL_COMMUNITY): Payer: 59 | Admitting: Licensed Clinical Social Worker

## 2022-08-19 ENCOUNTER — Ambulatory Visit: Payer: 59 | Admitting: Psychiatry

## 2022-10-04 ENCOUNTER — Encounter: Payer: Self-pay | Admitting: Psychiatry

## 2022-10-04 ENCOUNTER — Ambulatory Visit (INDEPENDENT_AMBULATORY_CARE_PROVIDER_SITE_OTHER): Payer: 59 | Admitting: Psychiatry

## 2022-10-04 VITALS — BP 108/73 | HR 67

## 2022-10-04 DIAGNOSIS — F5101 Primary insomnia: Secondary | ICD-10-CM | POA: Diagnosis not present

## 2022-10-04 DIAGNOSIS — F09 Unspecified mental disorder due to known physiological condition: Secondary | ICD-10-CM

## 2022-10-04 DIAGNOSIS — R6889 Other general symptoms and signs: Secondary | ICD-10-CM | POA: Insufficient documentation

## 2022-10-04 DIAGNOSIS — F3181 Bipolar II disorder: Secondary | ICD-10-CM

## 2022-10-04 DIAGNOSIS — F411 Generalized anxiety disorder: Secondary | ICD-10-CM | POA: Diagnosis not present

## 2022-10-04 MED ORDER — BUSPIRONE HCL 10 MG PO TABS
20.0000 mg | ORAL_TABLET | Freq: Two times a day (BID) | ORAL | 0 refills | Status: DC
Start: 2022-10-04 — End: 2023-01-15

## 2022-10-04 MED ORDER — VENLAFAXINE HCL ER 150 MG PO CP24
150.0000 mg | ORAL_CAPSULE | Freq: Every day | ORAL | 0 refills | Status: DC
Start: 2022-10-04 — End: 2023-01-15

## 2022-10-04 NOTE — Progress Notes (Signed)
BH MD OP Progress Note  10/04/2022 12:16 PM Stacy [name]  MRN:  [id number]  Chief Complaint:  Chief Complaint  Patient presents with   Follow-up   Anxiety   Depression   Medication Refill   HPI: Stacy [name] is a 62 Caucasian female, unemployed, lives in [place], married, has a history of bipolar disorder type II, GAD, insomnia, mild neurocognitive disorder, neuroleptic induced Parkinson's, Sjogren syndrome, hypertension, hypothyroidism, arthritis, migraine headaches, seizure-like spells was evaluated in office today.  Patient today reports she was recently diagnosed with Addison's disease.  She has upcoming appointment with endocrinologist for consultation.  She also continues to be under the care of cardiology.  Patient does report continued anxiety symptoms, feeling nervous, restless, fidgety, trouble concentrating, having racing thoughts.  Patient reports her anxiety symptoms are mostly situational.  She gets nervous when she is in social situations as well as gets anxious when she listens to the news and everything bad that is going on in the world right now.  She reports she has tried talking to her husband about not sharing information from the news with her.  However he does not seem to understand.  Patient reports she was able to establish care with the therapist has upcoming appointment with her therapist at [facility].  She looks forward to that.  Patient reports she does have clonazepam available however she has been limiting use.  She is compliant on her medications like gabapentin, BuSpar, venlafaxine.  Patient denies side effects to these medications.  Patient reports sleep is overall good.  Patient does report increased appetite since she uses food as a comfort when she is stressed out.  Patient appeared to be alert, oriented to person place time and situation.  3 word memory immediate 3 out of 3, after 5 minutes 2 out of 3.  Patient's attention and focus seem to  be good, she was able to spell the word 'WORLD' forward and backward.  Patient denies any other concerns today.  Visit Diagnosis:    ICD-10-CM   1. Bipolar 2 disorder, major depressive episode (HCC)  F31.81    IN PARTIAL REMISSION    2. GAD (generalized anxiety disorder)  F41.1 venlafaxine XR (EFFEXOR XR) 150 MG 24 hr capsule    busPIRone (BUSPAR) 10 MG tablet    3. Mild cognitive disorder  F09     4. Primary insomnia  F51.01       Past Psychiatric History: I have reviewed past psychiatric history from progress note on 12/11/2017.  Past trials of Paxil, Wellbutrin, risperidone, Latuda, Prolixin, Seroquel, Abilify, Zyprexa, Invega, Topamax, Lexapro, Belsomra, Depakote, Rexulti-could not afford it.  Neuropsychological testing completed by Dr. Rodenbough-07/10/2021-not consistent with conditions like Alzheimer's, Lewy body dementia or other subcortical dementia.  Cognitive issues likely due to psychiatric problems.  Past Medical History:  Past Medical History:  Diagnosis Date   Aneurysm (HCC)    Anxiety    Arthritis    Brain stem lesion    Chronic kidney disease    kideny stone   Depression    Family history of adverse reaction to anesthesia    mother has N/V   Fibromyalgia    GERD (gastroesophageal reflux disease)    Headache    Hypotension    Hypothyroid    Memory loss    Migraine    Raynaud disease    Sjogren's syndrome (HCC)    Thyroid disease     Past Surgical History:  Procedure Laterality Date   ABDOMINAL HYSTERECTOMY  BASAL CELL CARCINOMA EXCISION     BREAST BIOPSY Left    CESAREAN SECTION     1982, 1984   CYSTOSCOPY WITH STENT PLACEMENT Bilateral 08/01/2020   Procedure: CYSTOSCOPY WITH STENT PLACEMENT;  Surgeon: Vanna Scotland, MD;  Location: [facility];  Service: Urology;  Laterality: Bilateral;   CYSTOSCOPY/URETEROSCOPY/HOLMIUM LASER/STENT PLACEMENT Bilateral 08/14/2020   Procedure: CYSTOSCOPY/URETEROSCOPY/HOLMIUM LASER/STENT PLACEMENT;  Surgeon: Vanna Scotland, MD;  Location: [facility];  Service: Urology;  Laterality: Bilateral;   EYE SURGERY Right    cataract   KNEE SURGERY Left    torn meniscus   LITHOTRIPSY     MUSCLE BIOPSY     OOPHORECTOMY      Family Psychiatric History: I have reviewed family psychiatric history from progress note on 12/11/2017.  Family History:  Family History  Problem Relation Age of Onset   Migraines Mother    Anxiety disorder Mother    Depression Mother    Cancer Father    Hypertension Father    Stroke Father    Heart attack Father    Alcohol abuse Father    Diabetes Brother    Alcohol abuse Sister    Drug abuse Sister    Anxiety disorder Sister    Depression Sister    Drug abuse Brother    Cancer Paternal Aunt    Lupus Paternal Aunt    Thyroid disease Paternal Aunt    Diabetes Maternal Grandfather    Anxiety disorder Maternal Grandmother    Depression Maternal Grandmother     Social History: I have reviewed social history from my progress note on 12/11/2017. Social History   Socioeconomic History   Marital status: Married    Spouse name: Maisie Fus   Number of children: 2   Years of education: 14   Highest education level: Associate degree: occupational, Scientist, product/process development, or vocational program  Occupational History   Occupation: Disabled  Tobacco Use   Smoking status: Never   Smokeless tobacco: Never  Vaping Use   Vaping status: Never Used  Substance and Sexual Activity   Alcohol use: No    Alcohol/week: 0.0 standard drinks of alcohol   Drug use: Never   Sexual activity: Yes    Birth control/protection: Surgical  Other Topics Concern   Not on file  Social History Narrative   Lives at home with husband. Maisie Fus).    Disabled.   Education college.   Right handed.   2 cups caffeine/daily   Social Determinants of Health   Financial Resource Strain: Low Risk  (12/10/2017)   Overall Financial Resource Strain (CARDIA)    Difficulty of Paying Living Expenses: Not hard at all  Food  Insecurity: No Food Insecurity (12/10/2017)   Hunger Vital Sign    Worried About Running Out of Food in the Last Year: Never true    Ran Out of Food in the Last Year: Never true  Transportation Needs: No Transportation Needs (12/10/2017)   PRAPARE - Administrator, Civil Service (Medical): No    Lack of Transportation (Non-Medical): No  Physical Activity: Sufficiently Active (12/10/2017)   Exercise Vital Sign    Days of Exercise per Week: 7 days    Minutes of Exercise per Session: 30 min  Stress: Stress Concern Present (12/10/2017)   [employer] of Occupational Health - Occupational Stress Questionnaire    Feeling of Stress : Very much  Social Connections: Unknown (01/31/2022)   [facility]   Social Network    Social Network: Not  on file    Allergies:  Allergies  Allergen Reactions   Penicillins Hives   Amoxicillin Hives   Depakote [Divalproex Sodium]     vomiting   Lamictal [Lamotrigine]     Side effects of abdominal cramps, nausea , diarrhea ,nightmares    Metabolic Disorder Labs: Lab Results  Component Value Date   HGBA1C 5.0 11/13/2015   No results found for: "PROLACTIN" No results found for: "CHOL", "TRIG", "HDL", "CHOLHDL", "VLDL", "LDLCALC" Lab Results  Component Value Date   TSH 1.780 10/26/2015   TSH 0.525 03/15/2015    Therapeutic Level Labs: No results found for: "LITHIUM" Lab Results  Component Value Date   VALPROATE 10 (L) 07/28/2019   No results found for: "CBMZ"  Current Medications: Current Outpatient Medications  Medication Sig Dispense Refill   alendronate (FOSAMAX) 70 MG tablet 1 tablet 30 minutes before the first food, beverage or medicine of the day dissolved in 4 ounces of water     Ascorbic Acid (VITAMIN C) 100 MG tablet Take 100 mg by mouth daily.     cevimeline (EVOXAC) 30 MG capsule Take 30 mg by mouth 3 (three) times daily.     Cholecalciferol (VITAMIN D-3) 1000 units CAPS Take 1,000 Units by mouth  daily.     clonazePAM (KLONOPIN) 0.5 MG tablet Take 0.5-1 tablets (0.25-0.5 mg total) by mouth daily as needed for anxiety. AS NEEDED FOR SEVERE ANXIETY ATTACKS 21 tablet 2   cyclobenzaprine (FLEXERIL) 5 MG tablet Take 5 mg by mouth 3 (three) times daily as needed.     donepezil (ARICEPT) 10 MG tablet Take 10 mg by mouth every morning.     hydroxychloroquine (PLAQUENIL) 200 MG tablet Take 200 mg by mouth 2 (two) times daily.     levothyroxine (SYNTHROID) 200 MCG tablet Take 200 mcg by mouth daily.     metroNIDAZOLE (FLAGYL) 500 MG tablet Take 500 mg by mouth 3 (three) times daily.     Multiple Vitamins-Minerals (PRESERVISION AREDS) CAPS Take 1 capsule by mouth daily.     omeprazole (PRILOSEC) 40 MG capsule Take 40 mg by mouth daily.     pilocarpine (SALAGEN) 5 MG tablet Take 1 tablet by mouth 2 (two) times daily.     polyethylene glycol (MIRALAX / GLYCOLAX) 17 g packet Take 17 g by mouth daily. 14 each 0   SUMAtriptan (IMITREX) 100 MG tablet Take 100 mg by mouth every 2 (two) hours as needed for migraine.     venlafaxine XR (EFFEXOR XR) 150 MG 24 hr capsule Take 1 capsule (150 mg total) by mouth daily with breakfast. 90 capsule 0   vitamin E 45 MG (100 UNITS) capsule Take 100 Units by mouth daily.     busPIRone (BUSPAR) 10 MG tablet Take 2 tablets (20 mg total) by mouth 2 (two) times daily. 360 tablet 0   gabapentin (NEURONTIN) 400 MG capsule Take 4 capsules (1,600 mg total) by mouth as directed. Take 1 capsule daily AM and 1 capsule daily at noon and 2 capsules daily at bedtime 120 capsule 1   Na Sulfate-K Sulfate-Mg Sulf 17.5-3.13-1.6 GM/177ML SOLN MIX AND DRINK AS DIRECTED (Patient not taking: Reported on 10/04/2022)     No current facility-administered medications for this visit.     Musculoskeletal: Strength & Muscle Tone: within normal limits Gait & Station: normal Patient leans: N/A  Psychiatric Specialty Exam: Review of Systems  Psychiatric/Behavioral:  Positive for decreased  concentration. The patient is nervous/anxious.     Blood pressure 108/73,  pulse 67.There is no height or weight on file to calculate BMI.  General Appearance: Fairly Groomed  Eye Contact:  Fair  Speech:  Clear and Coherent  Volume:  Normal  Mood:  Anxious  Affect:  Congruent  Thought Process:  Goal Directed and Descriptions of Associations: Intact  Orientation:  Full (Time, Place, and Person)  Thought Content: Logical   Suicidal Thoughts:  No  Homicidal Thoughts:  No  Memory:  Immediate;   Fair Recent;   Fair Remote;   Poor  Judgement:  Fair  Insight:  Fair  Psychomotor Activity:  Increased and Restlessness  Concentration:  Concentration: Fair and Attention Span: Fair  Recall:  Poor  Fund of Knowledge: Fair  Language: Fair  Akathisia:  No  Handed:  Right  AIMS (if indicated): not done  Assets:  Communication Skills Desire for Improvement Housing Social Support  ADL's:  Intact  Cognition: WNL  Sleep:  Fair   Screenings: Midwife Visit from 10/22/2021 in [facility] Office Visit from 07/30/2021 in [facility] Office Visit from 06/15/2018 in [facility] Office Visit from 05/20/2018 in [facility]  AIMS Total Score 0 0 5 9      GAD-7    Flowsheet Row Office Visit from 10/04/2022 in [facility] Office Visit from 06/24/2022 in [facility] Office Visit from 04/26/2022 in [facility] Office Visit from 02/21/2022 in [facility] Office Visit from 12/26/2021 in [facility]  Total GAD-7 Score 13 20 14 14 12       Mini-Mental    Flowsheet Row Office Visit from 01/01/2016 in [facility] Office Visit from 11/13/2015 in [facility] Office Visit from 10/26/2015 in [facility] Office Visit from 04[facility]25/2015 in [facility] Office Visit from 10/25/2014 in [facility]  Total Score (max 30 points ) 25 26 21 23 29       PHQ2-9    Flowsheet Row Office Visit from 10/04/2022 in [facility] Office Visit from 06/24/2022 in [facility] Office Visit from 04/26/2022 in [facility] Office Visit from 02/21/2022 in [facility] Office Visit from 12/26/2021 in [facility]  PHQ-2 Total Score 3 3 2 2 2   PHQ-9 Total Score 10 17 5 7 7       Flowsheet Row Office Visit from 10/04/2022 in [facility] Office Visit from 06/24/2022 in [facility] Office Visit from 04/26/2022 in [facility]  C-SSRS RISK CATEGORY No Risk No Risk No Risk        Assessment and Plan: Stacy [name] is a 62 year old Caucasian female who has a history of bipolar disorder type II, neuroleptic induced Parkinson's disease, insomnia, cognitive disorder likely mild, Sjogren syndrome, hypothyroidism was evaluated in office today.  Patient continues to have anxiety, restlessness, recent diagnosis of Addison's disease, which also makes her anxious, currently motivated to start psychotherapy sessions, will benefit from the following plan.  Plan Bipolar disorder type II depressed-in partial remission Continue gabapentin 400 mg p.o. daily in the morning, 400 mg p.o. daily at  noon and 800 mg p.o. daily at bedtime   GAD-unstable Increase venlafaxine to 150 mg p.o. daily Continue gabapentin as  prescribed Continue clonazepam 0.25-0.5 mg p.o. daily as needed patient is currently limiting use BuSpar 20 mg p.o. twice daily Reviewed [place]MP AWARxE Patient to continue CBT-patient was able to establish care with a new therapist- [facility].  Primary insomnia-improving Continue sleep hygiene techniques. Gabapentin also helps.  For cognitive disorder-patient to continue to follow up with neurology.  Patient to sign an ROI to coordinate care with endocrinology, recent diagnosis of Addison's disease.     Collaboration of Care: Collaboration of Care: Referral or follow-up with counselor/therapist AEB patient to continue CBT.  Patient/Guardian was advised Release of Information must be obtained prior to any record release in order to collaborate their care with an outside provider. Patient/Guardian was advised if they have not already done so to contact the registration department to sign all necessary forms in order for [place] to release information regarding their care.   Consent: Patient/Guardian gives verbal consent for treatment and assignment of benefits for services provided during this visit. Patient/Guardian expressed understanding and agreed to proceed.   Follow-up in clinic in 8 weeks or sooner if needed.  This note was generated in part or whole with voice recognition software. Voice recognition is usually quite accurate but there are transcription errors that can and very often do occur. I apologize for any typographical errors that were not detected and corrected.    Jomarie Longs, MD 10/04/2022, 1:13 PM

## 2022-10-07 ENCOUNTER — Other Ambulatory Visit (HOSPITAL_COMMUNITY): Payer: Self-pay | Admitting: Internal Medicine

## 2022-10-07 DIAGNOSIS — R9439 Abnormal result of other cardiovascular function study: Secondary | ICD-10-CM

## 2022-10-18 ENCOUNTER — Telehealth (HOSPITAL_COMMUNITY): Payer: Self-pay | Admitting: *Deleted

## 2022-10-18 MED ORDER — IVABRADINE HCL 5 MG PO TABS: 10.0000 mg | ORAL_TABLET | Freq: Once | ORAL | 0 refills | Status: AC

## 2022-10-18 NOTE — Addendum Note (Signed)
Addended by: Johney Frame A on: 10/18/2022 02:31 PM   Modules accepted: Orders

## 2022-10-18 NOTE — Telephone Encounter (Deleted)
Attempted to call patient regarding upcoming cardiac CT appointment. Left message on voicemail with name and callback number Hayley Sharpe RN Navigator Cardiac Imaging [facility] [phone] Office   

## 2022-10-18 NOTE — Telephone Encounter (Signed)
Reaching out to patient to offer assistance regarding upcoming cardiac imaging study; insurance authorization pending, CT rescheduled.  Medication sent to pharmacy to take prior to CT. Nurse navigator will contact to discuss instructions prior to scan.   Johney Frame RN Navigator Cardiac Imaging [facility] [phone] office [phone] cell

## 2022-10-21 ENCOUNTER — Ambulatory Visit (HOSPITAL_COMMUNITY): Payer: Medicare HMO

## 2022-10-23 ENCOUNTER — Encounter (HOSPITAL_COMMUNITY): Payer: Self-pay

## 2022-10-24 ENCOUNTER — Telehealth (HOSPITAL_COMMUNITY): Payer: Self-pay | Admitting: Emergency Medicine

## 2022-10-24 NOTE — Telephone Encounter (Signed)
Reaching out to patient to offer assistance regarding upcoming cardiac imaging study; pt verbalizes understanding of appt date/time, parking situation and where to check in, pre-test NPO status and medications ordered, and verified current allergies; name and call back number provided for further questions should they arise Sara Wallace RN Navigator Cardiac Imaging [facility] [phone] office [phone] cell 

## 2022-10-25 ENCOUNTER — Ambulatory Visit (HOSPITAL_COMMUNITY)
Admission: RE | Admit: 2022-10-25 | Discharge: 2022-10-25 | Disposition: A | Discharge status: Home / Self Care | Payer: Managed Care, Other (non HMO) | Source: Ambulatory Visit | Attending: Internal Medicine | Admitting: Internal Medicine

## 2022-10-25 DIAGNOSIS — R9430 Abnormal result of cardiovascular function study, unspecified: Secondary | ICD-10-CM | POA: Diagnosis not present

## 2022-10-25 DIAGNOSIS — R9439 Abnormal result of other cardiovascular function study: Secondary | ICD-10-CM | POA: Insufficient documentation

## 2022-10-25 DIAGNOSIS — I2510 Atherosclerotic heart disease of native coronary artery without angina pectoris: Secondary | ICD-10-CM | POA: Diagnosis not present

## 2022-10-25 MED ORDER — IOHEXOL 350 MG/ML SOLN
95.0000 mL | Freq: Once | INTRAVENOUS | Status: AC | PRN
  Administered 2022-10-25: 95 mL via INTRAVENOUS

## 2022-10-25 MED ORDER — NITROGLYCERIN 0.4 MG SL SUBL
0.8000 mg | SUBLINGUAL_TABLET | SUBLINGUAL | Status: DC | PRN
  Administered 2022-10-25: 0.8 mg via SUBLINGUAL

## 2022-10-25 MED ORDER — NITROGLYCERIN 0.4 MG SL SUBL
SUBLINGUAL_TABLET | SUBLINGUAL | Status: AC
  Filled 2022-10-25: qty 2

## 2022-11-08 ENCOUNTER — Ambulatory Visit: Payer: Medicare HMO | Admitting: Nurse Practitioner

## 2022-11-08 DIAGNOSIS — E538 Deficiency of other specified B group vitamins: Secondary | ICD-10-CM | POA: Insufficient documentation

## 2022-11-08 DIAGNOSIS — E271 Primary adrenocortical insufficiency: Secondary | ICD-10-CM | POA: Insufficient documentation

## 2022-11-08 NOTE — Progress Notes (Deleted)
There were no vitals taken for this visit.   Subjective:    Patient ID: Journei Foust, female    DOB: 1960-07-09, 62 y.o.   MRN: [id number]  HPI: Montessa Garske is a 62 y.o. female  No chief complaint on file.  Hypothyroidism/addison's disease/Sjogren syndrome: managed by endocrinology, last seen on 10/04/2022. She is currently taking levothyroxine 200 mcg daily. Last TSH was 0.323 on 10/09/2022.  Cognitive disorder/Neuroleptic induced parkinsonism/migraines: managed by neurology, last seen on 06/12/2022. Missed follow up appointment in June, recommend reaching out and rescheduling.   B12 deficiency: noted on lab work from neurology, patient was instructed to take b12 supplement. Will recheck today.   Bipolar/insomnia/anxiety: managed by psychiatry, Dr. Elna Breslow.  Last seen on 10/04/2022. Currently taking gabapentin 400 mg in am, 400 mg at noon and 800 mg at bedtime, venlafaxine 150 mg daily, clonazepam 0.25-0.5 mg daily as needed, buspar 20 mg BID    10/04/2022   12:16 PM 06/24/2022    5:48 PM 04/26/2022   10:24 AM 02/21/2022    3:33 PM 12/26/2021   11:38 AM  Depression screen PHQ 2/9  Decreased Interest 2 1 1 1 1   Down, Depressed, Hopeless 1 2 1 1 1   PHQ - 2 Score 3 3 2 2 2   Altered sleeping 2 2 0 0 0  Tired, decreased energy 1 2 1 1 2   Change in appetite 0 2 0 0 0  Feeling bad or failure about yourself  0 2 1 0 0  Trouble concentrating 2 3 1 2 1   Moving slowly or fidgety/restless 2 3 0 2 2  Suicidal thoughts 0 0 0 0 0  PHQ-9 Score 10 17 5 7 7   Difficult doing work/chores Somewhat difficult  Somewhat difficult Somewhat difficult        10/04/2022   12:16 PM 06/24/2022    5:48 PM 04/26/2022   10:24 AM 02/21/2022    3:33 PM  GAD 7 : Generalized Anxiety Score  Nervous, Anxious, on Edge 2 3 1 2   Control/stop worrying 2 3 3 3   Worry too much - different things 2 3 2 3   Trouble relaxing 2 3 2 2   Restless 2 3 3 2   Easily annoyed or irritable 1 3 1 1   Afraid - awful might happen 2 2 2  1   Total GAD 7 Score 13 20 14 14   Anxiety Difficulty Somewhat difficult  Somewhat difficult Somewhat difficult       Relevant past medical, surgical, family and social history reviewed and updated as indicated. Interim medical history since our last visit reviewed. Allergies and medications reviewed and updated.  Review of Systems  Constitutional: Negative for fever or weight change.  Respiratory: Negative for cough and shortness of breath.   Cardiovascular: Negative for chest pain or palpitations.  Gastrointestinal: Negative for abdominal pain, no bowel changes.  Musculoskeletal: Negative for gait problem or joint swelling.  Skin: Negative for rash.  Neurological: Negative for dizziness or headache.  No other specific complaints in a complete review of systems (except as listed in HPI above).      Objective:    There were no vitals taken for this visit.  Wt Readings from Last 3 Encounters:  09/13/21 157 lb (71.2 kg)  09/13/20 156 lb (70.8 kg)  08/14/20 156 lb (70.8 kg)    Physical Exam  Constitutional: Patient appears well-developed and well-nourished. Obese *** No distress.  HEENT: head atraumatic, normocephalic, pupils equal and reactive to light, ears ***, neck  supple, throat within normal limits Cardiovascular: Normal rate, regular rhythm and normal heart sounds.  No murmur heard. No BLE edema. Pulmonary/Chest: Effort normal and breath sounds normal. No respiratory distress. Abdominal: Soft.  There is no tenderness. Psychiatric: Patient has a normal mood and affect. behavior is normal. Judgment and thought content normal.  Results for orders placed or performed in visit on 09/13/21  Microscopic Examination   Urine  Result Value Ref Range   WBC, UA 0-5 0 - 5 /hpf   RBC, Urine 0-2 0 - 2 /hpf   Epithelial Cells (non renal) 0-10 0 - 10 /hpf   Bacteria, UA None seen None seen/Few  Urinalysis, Complete  Result Value Ref Range   Specific Gravity, UA 1.010 1.005 - 1.030    pH, UA 5.5 5.0 - 7.5   Color, UA Yellow Yellow   Appearance Ur Clear Clear   Leukocytes,UA Negative Negative   Protein,UA Negative Negative/Trace   Glucose, UA Negative Negative   Ketones, UA Negative Negative   RBC, UA Trace (A) Negative   Bilirubin, UA Negative Negative   Urobilinogen, Ur 0.2 0.2 - 1.0 mg/dL   Nitrite, UA Negative Negative   Microscopic Examination See below:       Assessment & Plan:   Problem List Items Addressed This Visit   None    Follow up plan: No follow-ups on file.

## 2022-11-25 ENCOUNTER — Encounter: Payer: Self-pay | Admitting: Psychiatry

## 2022-11-25 ENCOUNTER — Ambulatory Visit (INDEPENDENT_AMBULATORY_CARE_PROVIDER_SITE_OTHER): Payer: 59 | Admitting: Psychiatry

## 2022-11-25 VITALS — BP 118/72 | HR 93 | Temp 98.1°F | Ht 67.0 in | Wt 161.2 lb

## 2022-11-25 DIAGNOSIS — F3176 Bipolar disorder, in full remission, most recent episode depressed: Secondary | ICD-10-CM | POA: Diagnosis not present

## 2022-11-25 DIAGNOSIS — F411 Generalized anxiety disorder: Secondary | ICD-10-CM | POA: Diagnosis not present

## 2022-11-25 DIAGNOSIS — F09 Unspecified mental disorder due to known physiological condition: Secondary | ICD-10-CM

## 2022-11-25 DIAGNOSIS — F5101 Primary insomnia: Secondary | ICD-10-CM | POA: Diagnosis not present

## 2022-11-25 MED ORDER — GABAPENTIN 400 MG PO CAPS
1600.0000 mg | ORAL_CAPSULE | ORAL | 3 refills | Status: DC
Start: 2022-11-25 — End: 2023-05-13

## 2022-11-25 MED ORDER — CLONAZEPAM 0.5 MG PO TABS: 0.2500 mg | ORAL_TABLET | Freq: Every day | ORAL | 3 refills | Status: DC | PRN

## 2022-11-25 NOTE — Progress Notes (Signed)
BH MD OP Progress Note  11/25/2022 10:37 AM Stacy [name]  MRN:  [id number]  Chief Complaint:  Chief Complaint  Patient presents with   Follow-up   Anxiety   Medication Refill   Depression   HPI: Stacy [name] is a 62 year old Caucasian female, unemployed, lives in [place], married, has a history of bipolar disorder type II, GAD, mild cognitive disorder, neuroleptic induced Parkinson's, Addison's disease, Sjogren syndrome, hypertension, hypothyroidism, arthritis, migraine headaches, seizure-like spells was evaluated in office today.  Patient today reports overall she is improving on the current medication regimen.  She reports her anxiety symptoms have improved.  She however continues to be restless and fidgety often.  Patient reports she also has trouble focusing although it is improving.  She denies any side effects to medications including the recent higher dosage of venlafaxine, BuSpar.  Currently compliant on gabapentin.  Denies side effects.  Uses the clonazepam only as needed and that helps.  Patient reports sleep is better.  She has a good sleep hygiene.  That does also help.  Patient continues to have trouble with concentration and her cognitive function per her report.  She reports certain instructions has to be given to her multiple times, sometimes it has to be broken down into step by step instructions for her to comprehend better.  That does frustrate her husband however he has been supportive.  She continues to be on Donepezil.  Denies side effects.  Patient today appeared to be alert, oriented to person place time situation.  3 word memory immediate 3 out of 3, after 5 minutes 2 out of 3.  Was able to do math, serial sevens.  Patient currently denies any suicidality, homicidality or perceptual disturbances.  Patient with history of syncopal episodes, currently following up with cardiology.  She also has a diagnosis of Addison's disease.  Reports she was recently taken  off of the Synthroid and has to go back for repeat labs.  Patient denies any other concerns today.  Visit Diagnosis:    ICD-10-CM   1. Bipolar disorder, in full remission, most recent episode depressed (HCC)  F31.76    Type 2    2. GAD (generalized anxiety disorder)  F41.1 gabapentin (NEURONTIN) 400 MG capsule    clonazePAM (KLONOPIN) 0.5 MG tablet    3. Mild cognitive disorder  F09     4. Primary insomnia  F51.01       Past Psychiatric History: I have reviewed past psychiatric history from progress note on 12/10/2017.  Past trials of Paxil, Wellbutrin, risperidone, Latuda, Prolixin, Seroquel, Abilify, Zyprexa, Invega, Topamax, Lexapro, Belsomra, Depakote, Rexulti-could not afford it.  Neuropsychological testing completed by Dr. Rodenbough-07/10/2021-not consistent with conditions like Alzheimer's, Lewy body dementia or other subcortical dementia.  Cognitive issues likely due to psychiatric problems.  Past Medical History:  Past Medical History:  Diagnosis Date   Aneurysm (HCC)    Anxiety    Arthritis    Brain stem lesion    Chronic kidney disease    kideny stone   Depression    Family history of adverse reaction to anesthesia    mother has N/V   Fibromyalgia    GERD (gastroesophageal reflux disease)    Headache    Hypotension    Hypothyroid    Memory loss    Migraine    Raynaud disease    Sjogren's syndrome (HCC)    Thyroid disease     Past Surgical History:  Procedure Laterality Date   ABDOMINAL HYSTERECTOMY  BASAL CELL CARCINOMA EXCISION     BREAST BIOPSY Left    CESAREAN SECTION     1982, 1984   CYSTOSCOPY WITH STENT PLACEMENT Bilateral 08/01/2020   Procedure: CYSTOSCOPY WITH STENT PLACEMENT;  Surgeon: Vanna Scotland, MD;  Location: [facility];  Service: Urology;  Laterality: Bilateral;   CYSTOSCOPY/URETEROSCOPY/HOLMIUM LASER/STENT PLACEMENT Bilateral 08/14/2020   Procedure: CYSTOSCOPY/URETEROSCOPY/HOLMIUM LASER/STENT PLACEMENT;  Surgeon: Vanna Scotland, MD;   Location: [facility];  Service: Urology;  Laterality: Bilateral;   EYE SURGERY Right    cataract   KNEE SURGERY Left    torn meniscus   LITHOTRIPSY     MUSCLE BIOPSY     OOPHORECTOMY      Family Psychiatric History: I have reviewed family psychiatric history from progress note on 12/10/2017.  Family History:  Family History  Problem Relation Age of Onset   Migraines Mother    Anxiety disorder Mother    Depression Mother    Cancer Father    Hypertension Father    Stroke Father    Heart attack Father    Alcohol abuse Father    Diabetes Brother    Alcohol abuse Sister    Drug abuse Sister    Anxiety disorder Sister    Depression Sister    Drug abuse Brother    Cancer Paternal Aunt    Lupus Paternal Aunt    Thyroid disease Paternal Aunt    Diabetes Maternal Grandfather    Anxiety disorder Maternal Grandmother    Depression Maternal Grandmother     Social History: I have reviewed social history from progress note on 12/10/2017. Social History   Socioeconomic History   Marital status: Married    Spouse name: Maisie Fus   Number of children: 2   Years of education: 14   Highest education level: Associate degree: occupational, Scientist, product/process development, or vocational program  Occupational History   Occupation: Disabled  Tobacco Use   Smoking status: Never   Smokeless tobacco: Never  Vaping Use   Vaping status: Never Used  Substance and Sexual Activity   Alcohol use: No    Alcohol/week: 0.0 standard drinks of alcohol   Drug use: Never   Sexual activity: Yes    Birth control/protection: Surgical  Other Topics Concern   Not on file  Social History Narrative   Lives at home with husband. Maisie Fus).    Disabled.   Education college.   Right handed.   2 cups caffeine/daily   Social Determinants of Health   Financial Resource Strain: Low Risk  (12/10/2017)   Overall Financial Resource Strain (CARDIA)    Difficulty of Paying Living Expenses: Not hard at all  Food Insecurity: No Food  Insecurity (12/10/2017)   Hunger Vital Sign    Worried About Running Out of Food in the Last Year: Never true    Ran Out of Food in the Last Year: Never true  Transportation Needs: No Transportation Needs (12/10/2017)   PRAPARE - Administrator, Civil Service (Medical): No    Lack of Transportation (Non-Medical): No  Physical Activity: Sufficiently Active (12/10/2017)   Exercise Vital Sign    Days of Exercise per Week: 7 days    Minutes of Exercise per Session: 30 min  Stress: Stress Concern Present (12/10/2017)   [employer] of Occupational Health - Occupational Stress Questionnaire    Feeling of Stress : Very much  Social Connections: Unknown (01/31/2022)   Received from [facility]   Social Network    Social Network:  Not on file    Allergies:  Allergies  Allergen Reactions   Penicillins Hives   Amoxicillin Hives   Depakote [Divalproex Sodium]     vomiting   Lamictal [Lamotrigine]     Side effects of abdominal cramps, nausea , diarrhea ,nightmares    Metabolic Disorder Labs: Lab Results  Component Value Date   HGBA1C 5.0 11/13/2015   No results found for: "PROLACTIN" No results found for: "CHOL", "TRIG", "HDL", "CHOLHDL", "VLDL", "LDLCALC" Lab Results  Component Value Date   TSH 1.780 10/26/2015   TSH 0.525 03/15/2015    Therapeutic Level Labs: No results found for: "LITHIUM" Lab Results  Component Value Date   VALPROATE 10 (L) 07/28/2019   No results found for: "CBMZ"  Current Medications: Current Outpatient Medications  Medication Sig Dispense Refill   alendronate (FOSAMAX) 70 MG tablet 1 tablet 30 minutes before the first food, beverage or medicine of the day dissolved in 4 ounces of water     Ascorbic Acid (VITAMIN C) 100 MG tablet Take 100 mg by mouth daily.     busPIRone (BUSPAR) 10 MG tablet Take 2 tablets (20 mg total) by mouth 2 (two) times daily. 360 tablet 0   cevimeline (EVOXAC) 30 MG capsule Take 30 mg by mouth  3 (three) times daily.     Cholecalciferol (VITAMIN D-3) 1000 units CAPS Take 1,000 Units by mouth daily.     cyclobenzaprine (FLEXERIL) 5 MG tablet Take 5 mg by mouth 3 (three) times daily as needed.     donepezil (ARICEPT) 10 MG tablet Take 10 mg by mouth every morning.     hydroxychloroquine (PLAQUENIL) 200 MG tablet Take 200 mg by mouth 2 (two) times daily.     metroNIDAZOLE (FLAGYL) 500 MG tablet Take 500 mg by mouth 3 (three) times daily.     Multiple Vitamins-Minerals (PRESERVISION AREDS) CAPS Take 1 capsule by mouth daily.     omeprazole (PRILOSEC) 40 MG capsule Take 40 mg by mouth daily.     pilocarpine (SALAGEN) 5 MG tablet Take 1 tablet by mouth 2 (two) times daily.     polyethylene glycol (MIRALAX / GLYCOLAX) 17 g packet Take 17 g by mouth daily. 14 each 0   SUMAtriptan (IMITREX) 100 MG tablet Take 100 mg by mouth every 2 (two) hours as needed for migraine.     venlafaxine XR (EFFEXOR XR) 150 MG 24 hr capsule Take 1 capsule (150 mg total) by mouth daily with breakfast. 90 capsule 0   vitamin E 45 MG (100 UNITS) capsule Take 100 Units by mouth daily.     clonazePAM (KLONOPIN) 0.5 MG tablet Take 0.5-1 tablets (0.25-0.5 mg total) by mouth daily as needed for anxiety. AS NEEDED FOR SEVERE ANXIETY ATTACKS 21 tablet 3   gabapentin (NEURONTIN) 400 MG capsule Take 4 capsules (1,600 mg total) by mouth as directed. Take 1 capsule daily AM and 1 capsule daily at noon and 2 capsules daily at bedtime 120 capsule 3   No current facility-administered medications for this visit.     Musculoskeletal: Strength & Muscle Tone: within normal limits Gait & Station: normal Patient leans: N/A  Psychiatric Specialty Exam: Review of Systems  Psychiatric/Behavioral:  Positive for decreased concentration. The patient is nervous/anxious.     Blood pressure 118/72, pulse 93, temperature 98.1 F (36.7 C), temperature source Temporal, height 5\' 7"  (1.702 m), weight 161 lb 3.2 oz (73.1 kg), SpO2 95%.Body  mass index is 25.25 kg/m.  General Appearance: Casual  Eye Contact:  Fair  Speech:  Normal Rate  Volume:  Normal  Mood:  Anxious  Affect:  Appropriate  Thought Process:  Goal Directed and Descriptions of Associations: Intact  Orientation:  Full (Time, Place, and Person)  Thought Content: Logical   Suicidal Thoughts:  No  Homicidal Thoughts:  No  Memory:  Immediate;   Good Recent;   Good Remote;   Poor  Judgement:  Fair  Insight:  Fair  Psychomotor Activity:  Restlessness  Concentration:  Concentration: Fair and Attention Span: Fair  Recall:  [employer] of Knowledge: Fair  Language: Fair  Akathisia:  No  Handed:  Right  AIMS (if indicated): not done  Assets:  Communication Skills Desire for Improvement Housing Intimacy Social Support  ADL's:  Intact  Cognition: WNL  Sleep:  Fair   Screenings: Midwife Visit from 10/22/2021 in [facility] Office Visit from 07/30/2021 in [facility] Office Visit from 06/15/2018 in [facility] Office Visit from 05/20/2018 in [facility]  AIMS Total Score 0 0 5 9      GAD-7    Flowsheet Row Office Visit from 11/25/2022 in [facility] Office Visit from 10/04/2022 in [facility] Office Visit from 06/24/2022 in [facility] Office Visit from 04/26/2022 in [facility] Office Visit from 02/21/2022 in [facility]  Total GAD-7 Score 8 13 20 14 14       Mini-Mental    Flowsheet Row Office Visit from 01/01/2016 in [facility] Office Visit from 11/13/2015 in [facility] Office Visit from 10[facility]26/2015 in [facility] Office Visit from 04/25/2015 in [facility] Office Visit from 10/25/2014 in [facility]  Total Score (max 30 points ) 25 26 21 23 29       PHQ2-9    Flowsheet Row Office Visit from 11/25/2022 in [facility] Office Visit from 10/04/2022 in [facility] Visit from 06/24/2022 in [facility] Visit from 04/26/2022 in [facility] Office Visit from 02/21/2022 in [facility]  PHQ-2 Total Score 3 3 3 2 2   PHQ-9 Total Score 8 10 17 5 7       Flowsheet Row Office Visit from 11/25/2022 in [facility] Office Visit from 10/04/2022 in [facility] Office Visit from 06/24/2022 in [facility]  C-SSRS RISK CATEGORY No Risk No Risk No Risk        Assessment and Plan: Jermisha Hoffart is a 62 year old Caucasian female who has a history of bipolar disorder type II, neuroleptic induced Parkinson's disease, insomnia, cognitive disorder likely mild, Addison's disease, Sjogren syndrome, hypothyroidism was evaluated in office today.  Patient is currently improving on the current medication regimen although continues to have anxiety, observed as restless in session as well as cognitive issues, chronic, will benefit from following plan.  Plan Bipolar disorder type II depressed in remission Gabapentin 400 mg p.o. daily in the morning, 400 mg p.o. daily at noon, 800 mg p.o. daily at bedtime.  GAD-unstable Venlafaxine 150 mg p.o. daily Gabapentin as prescribed Clonazepam 0.25-0.5 mg  daily as needed.  Patient to limit use Reviewed  PMP AWARxE BuSpar 20 mg p.o. twice daily Patient to continue  psychotherapy with [facility], patient reports a previous therapist left and she is currently awaiting an appointment with new therapist at the same company.  Primary insomnia-stable Continue sleep hygiene techniques Continue gabapentin as prescribed  Mild cognitive disorder-patient to continue to follow up with neurology   I have reviewed labs recently 10/09/2022-discussed with patient-TSH-low, T4 normal at 1.08.  Patient currently not on Synthroid.  Patient awaiting repeat labs.   Collaboration of Care: Collaboration of Care: Referral or follow-up with counselor/therapist AEB patient encouraged to continue psychotherapy.  Will coordinate care with cardiology-pending reports.  Patient/Guardian was advised Release of Information must be obtained prior to any record release in order to collaborate their care with an outside provider. Patient/Guardian was advised if they have not already done so to contact the registration department to sign all necessary forms in order for [place] to release information regarding their care.   Consent: Patient/Guardian gives verbal consent for treatment and assignment of benefits for services provided during this visit. Patient/Guardian expressed understanding and agreed to proceed.   Follow-up in clinic in 2 to 3 months or sooner if needed.  This note was generated in part or whole with voice recognition software. Voice recognition is usually quite accurate but there are transcription errors that can and very often do occur. I apologize for any typographical errors that were not detected and corrected.    Jomarie Longs, MD 11/25/2022, 10:37 AM

## 2023-01-03 IMAGING — US US RENAL
1 series · 14 of 25 positions shown · non-contrast
Comparison: CT abdomen of renal 07/31/2020

CLINICAL DATA: Bilateral ureteral calculi

EXAM:
RENAL / URINARY TRACT ULTRASOUND COMPLETE

[Series 1: us renal · 0.23mm/px · 14 of 54 slices shown]
[im 1/54]
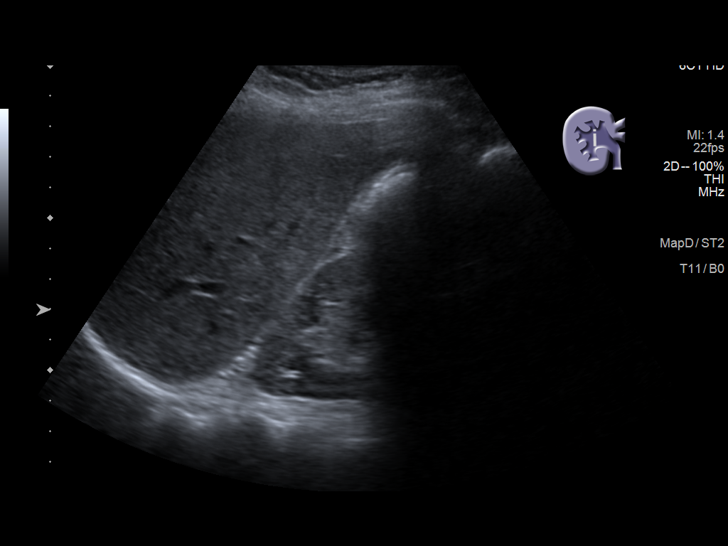
[im 5/54]
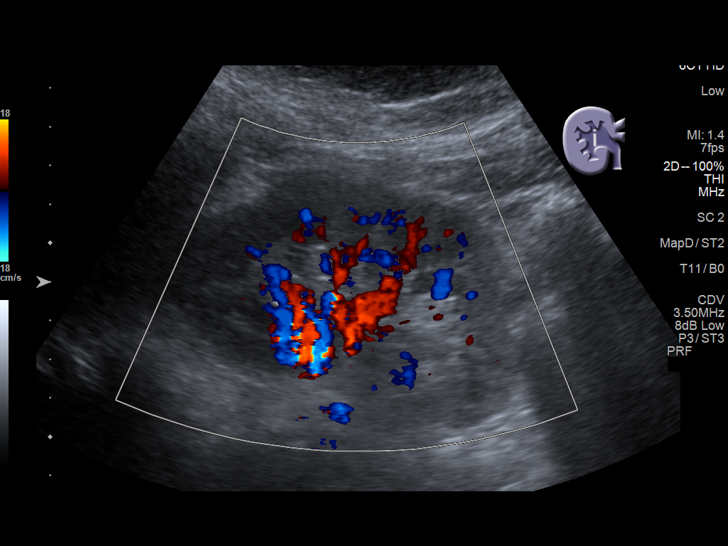
[im 9/54]
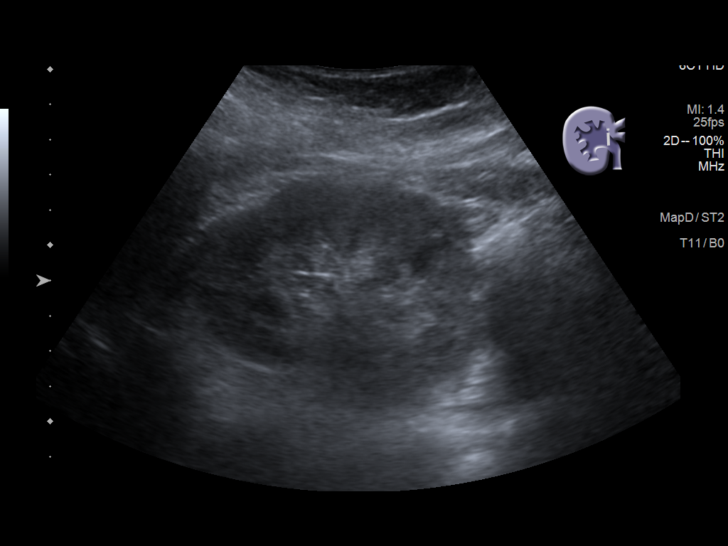
[im 14/54]
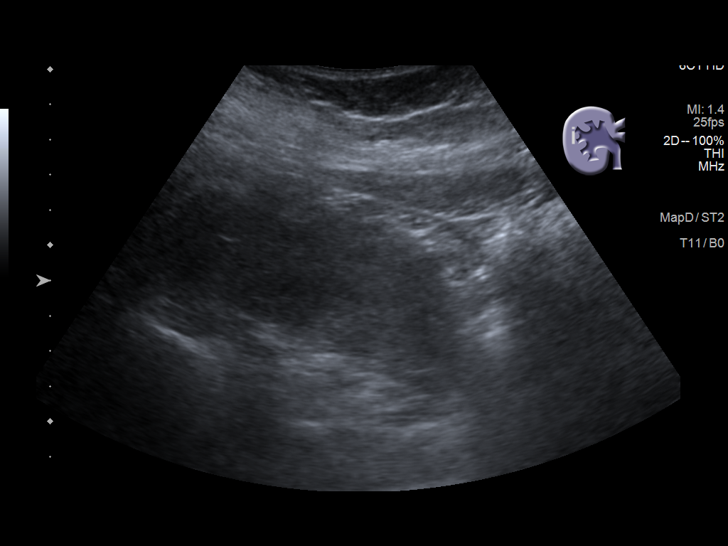
[im 18/54]
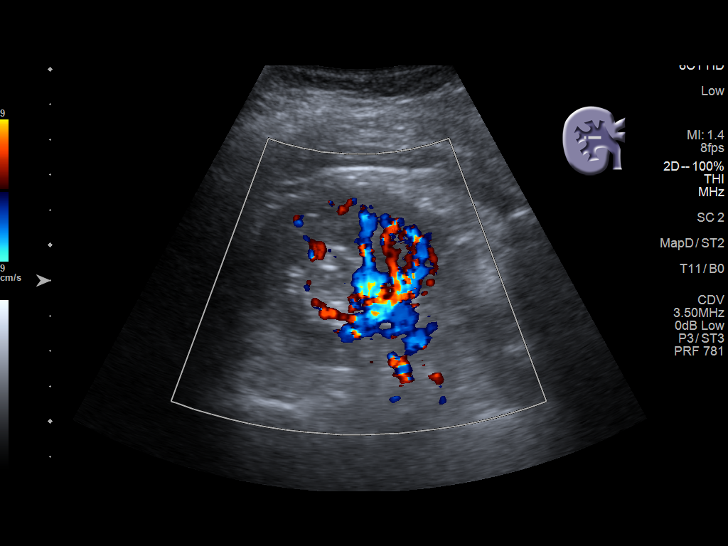
[im 20/54]
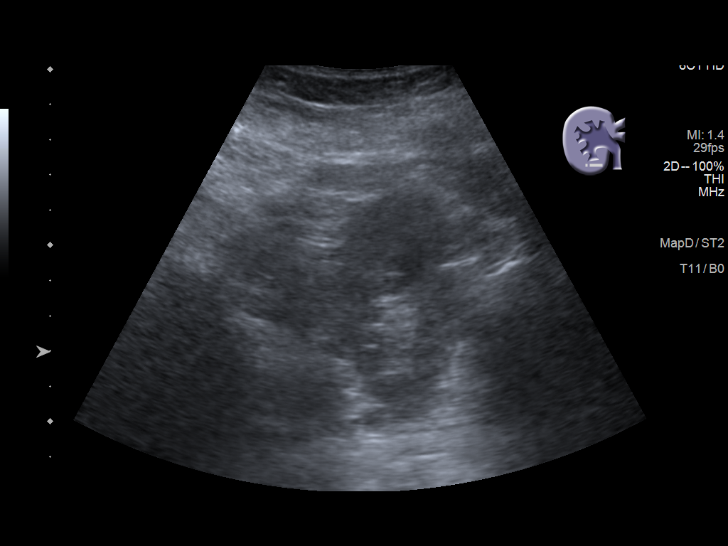
[im 25/54]
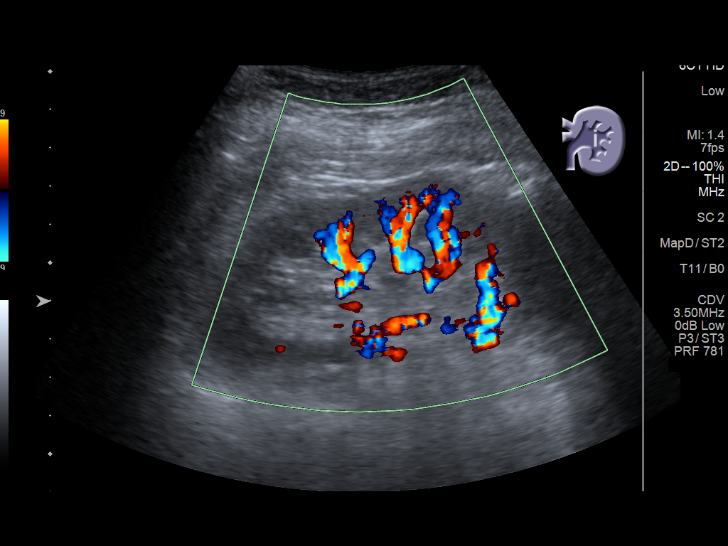
[im 29/54]
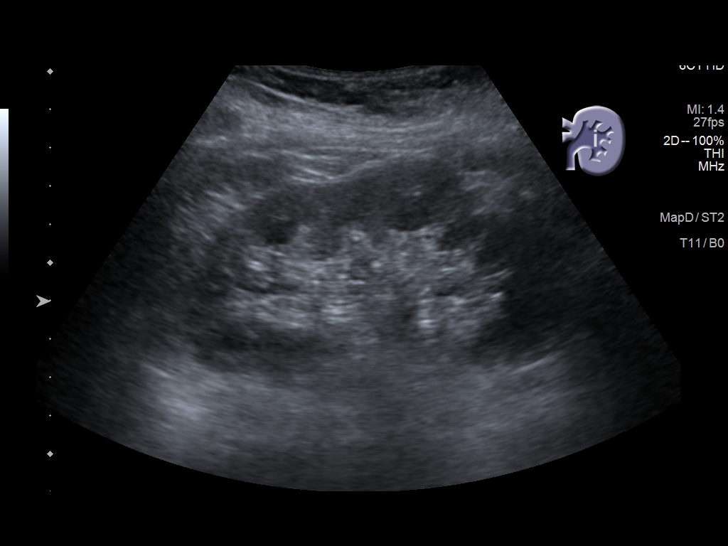
[im 34/54]
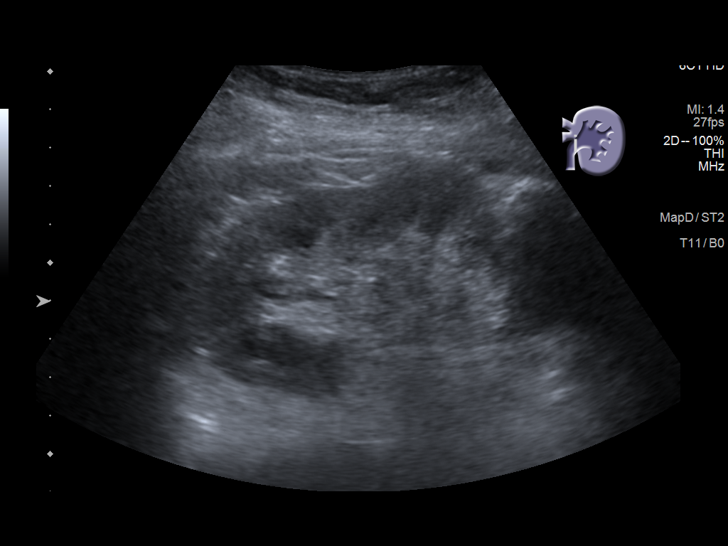
[im 36/54]
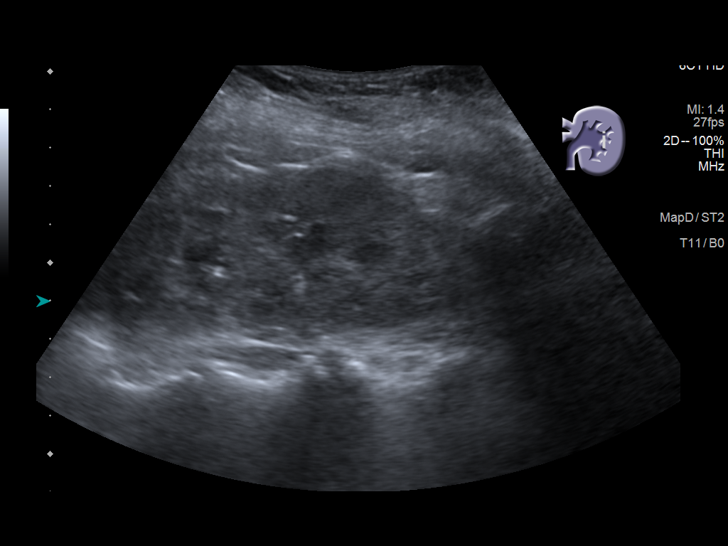
[im 40/54]
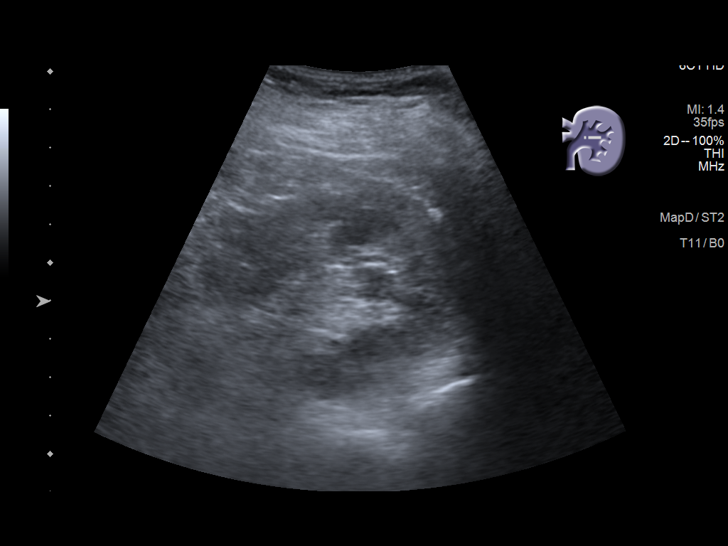
[im 45/54]
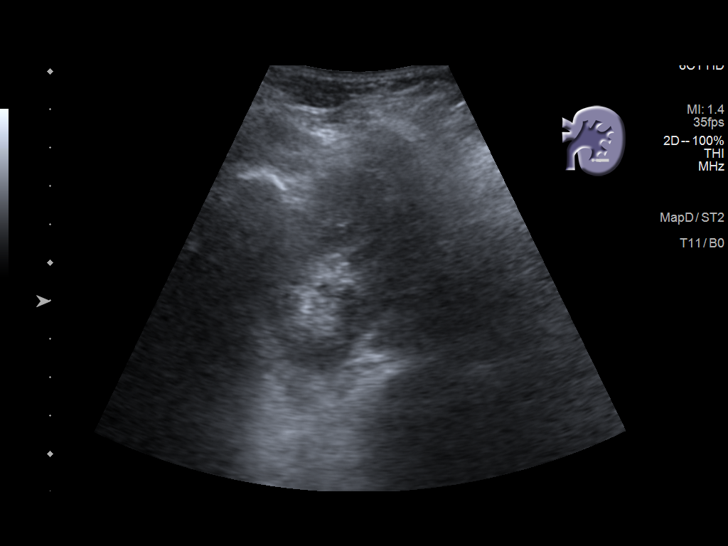
[im 49/54]
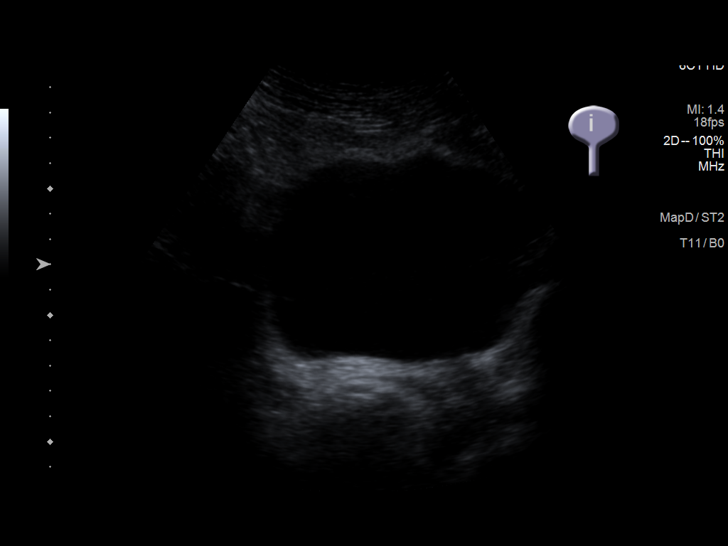
[im 54/54]
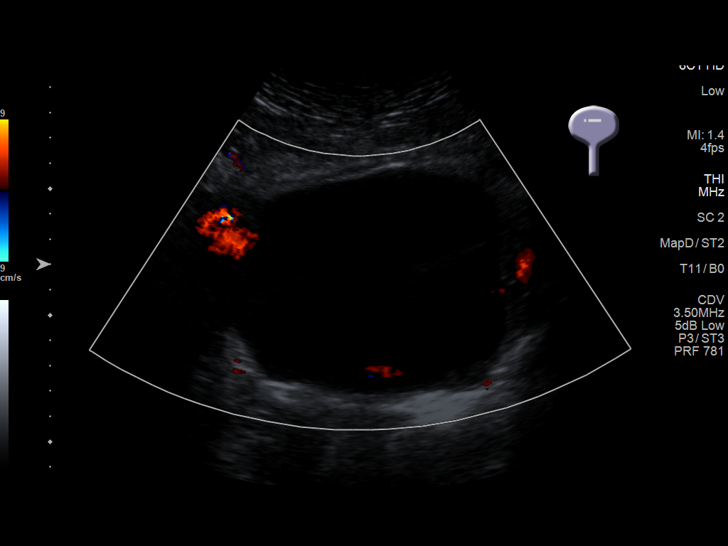

[14 of 25 positions shown; findings below may reference images not displayed]

FINDINGS: Right Kidney:

Renal measurements: 10.1 x 5.6 x 4.4 cm = volume: 130 mL.
Echogenicity within normal limits. No mass or hydronephrosis
visualized.

Left Kidney:

Renal measurements: 10.1 x 5.2 x 5.3 cm = volume: 146 mL.
Echogenicity within normal limits. No mass or hydronephrosis
visualized.

Urinary bladder:

Appears normal for degree of bladder distention.

Other:

None.
IMPRESSION: Unremarkable renal ultrasound.

## 2023-01-13 ENCOUNTER — Ambulatory Visit: Payer: 59 | Admitting: Psychiatry

## 2023-01-15 ENCOUNTER — Encounter: Payer: Self-pay | Admitting: Psychiatry

## 2023-01-15 ENCOUNTER — Ambulatory Visit (INDEPENDENT_AMBULATORY_CARE_PROVIDER_SITE_OTHER): Payer: 59 | Admitting: Psychiatry

## 2023-01-15 VITALS — BP 110/74 | HR 51 | Temp 98.7°F | Ht 67.0 in | Wt 159.2 lb

## 2023-01-15 DIAGNOSIS — F3176 Bipolar disorder, in full remission, most recent episode depressed: Secondary | ICD-10-CM | POA: Diagnosis not present

## 2023-01-15 DIAGNOSIS — F09 Unspecified mental disorder due to known physiological condition: Secondary | ICD-10-CM

## 2023-01-15 DIAGNOSIS — F411 Generalized anxiety disorder: Secondary | ICD-10-CM | POA: Diagnosis not present

## 2023-01-15 DIAGNOSIS — F5101 Primary insomnia: Secondary | ICD-10-CM

## 2023-01-15 MED ORDER — VENLAFAXINE HCL ER 150 MG PO CP24
150.0000 mg | ORAL_CAPSULE | Freq: Every day | ORAL | 3 refills | Status: DC
Start: 2023-01-15 — End: 2024-01-14

## 2023-01-15 MED ORDER — BUSPIRONE HCL 10 MG PO TABS: 20.0000 mg | ORAL_TABLET | Freq: Two times a day (BID) | ORAL | 3 refills | Status: DC

## 2023-01-15 NOTE — Progress Notes (Unsigned)
BH MD OP Progress Note  01/15/2023 4:25 PM Stacy [name]  MRN:  [id number]  Chief Complaint:  Chief Complaint  Patient presents with   Follow-up   Depression   Anxiety   Insomnia   Medication Refill   HPI: Stacy [name] is a 62 year old Caucasian female, currently unemployed, lives in [place], married, has a history of bipolar disorder, GAD, mild cognitive disorder, primary insomnia, neuroleptic induced Parkinson's, Addison's disease, Sjogren syndrome, hypertension, hypothyroidism, arthritis, migraine headaches, seizure-like spells was evaluated in office today for a follow-up appointment.  Patient today appeared to be pleasant, alert, oriented to person place time situation.  Patient was able to answer questions appropriately in session today.  She reports overall her depression symptoms are improving.  She did have 3 days recently when she felt anxious and does not know what triggered it.  She however was able to take clonazepam as needed and that helped.  She is compliant on the venlafaxine, BuSpar, denies side effects.  Patient denies any hypomanic or manic symptoms.  Patient reports sleep is overall good.  Has good sleep hygiene.  Continues to have trouble with concentration as well as cognitive function.  She reports she has to break down certain activities step-by-step for her to comprehend better.  She does not read books since she is unable to sit down and concentrate.  Her husband has been supportive.  She has started psychotherapy and agrees to discuss with therapist as well about her cognitive issues and maybe work on following certain strategies.  Patient denies any suicidality, homicidality or perceptual disturbances.  She is currently compliant on the gabapentin, BuSpar, venlafaxine, denies side effects.  Patient denies any other concerns today.  Visit Diagnosis:    ICD-10-CM   1. Bipolar disorder, in full remission, most recent episode depressed (HCC)   F31.76     2. GAD (generalized anxiety disorder)  F41.1 busPIRone (BUSPAR) 10 MG tablet    venlafaxine XR (EFFEXOR XR) 150 MG 24 hr capsule    3. Mild cognitive disorder  F09     4. Primary insomnia  F51.01       Past Psychiatric History: I have reviewed past psychiatric history from progress note on 12/10/2017.  Past trials of Paxil, Wellbutrin, risperidone, Latuda, Prolixin, Seroquel, Abilify, Zyprexa, Invega, Topamax, Lexapro, Belsomra, Depakote, Rexulti-could not afford it.  Neuropsychological testing completed by Dr. Rodenbough-07/10/2021-not consistent with conditions like Alzheimer's, Lewy body dementia or other subcortical dementia.  Cognitive issues likely due to psychiatric condition.  Past Medical History:  Past Medical History:  Diagnosis Date   Aneurysm (HCC)    Anxiety    Arthritis    Brain stem lesion    Chronic kidney disease    kideny stone   Depression    Family history of adverse reaction to anesthesia    mother has N/V   Fibromyalgia    GERD (gastroesophageal reflux disease)    Headache    Hypotension    Hypothyroid    Memory loss    Migraine    Raynaud disease    Sjogren's syndrome (HCC)    Thyroid disease     Past Surgical History:  Procedure Laterality Date   ABDOMINAL HYSTERECTOMY     BASAL CELL CARCINOMA EXCISION     BREAST BIOPSY Left    CESAREAN SECTION     1982, 1984   CYSTOSCOPY WITH STENT PLACEMENT Bilateral 08/01/2020   Procedure: CYSTOSCOPY WITH STENT PLACEMENT;  Surgeon: Vanna Scotland, MD;  Location: [facility] OR[facility];  Service:  Urology;  Laterality: Bilateral;   CYSTOSCOPY/URETEROSCOPY/HOLMIUM LASER/STENT PLACEMENT Bilateral 08/14/2020   Procedure: CYSTOSCOPY/URETEROSCOPY/HOLMIUM LASER/STENT PLACEMENT;  Surgeon: Vanna Scotland, MD;  Location: [facility];  Service: Urology;  Laterality: Bilateral;   EYE SURGERY Right    cataract   KNEE SURGERY Left    torn meniscus   LITHOTRIPSY     MUSCLE BIOPSY     OOPHORECTOMY      Family Psychiatric  History: I have reviewed family psychiatric history from my progress note on 12/10/2017.  Family History:  Family History  Problem Relation Age of Onset   Migraines Mother    Anxiety disorder Mother    Depression Mother    Cancer Father    Hypertension Father    Stroke Father    Heart attack Father    Alcohol abuse Father    Diabetes Brother    Alcohol abuse Sister    Drug abuse Sister    Anxiety disorder Sister    Depression Sister    Drug abuse Brother    Cancer Paternal Aunt    Lupus Paternal Aunt    Thyroid disease Paternal Aunt    Diabetes Maternal Grandfather    Anxiety disorder Maternal Grandmother    Depression Maternal Grandmother     Social History: I have reviewed social history from progress note on 12/10/2017. Social History   Socioeconomic History   Marital status: Married    Spouse name: Stacy [name]   Number of children: 2   Years of education: 14   Highest education level: Associate degree: occupational, Scientist, product/process development, or vocational program  Occupational History   Occupation: Disabled  Tobacco Use   Smoking status: Never   Smokeless tobacco: Never  Vaping Use   Vaping status: Never Used  Substance and Sexual Activity   Alcohol use: No    Alcohol/week: 0.0 standard drinks of alcohol   Drug use: Never   Sexual activity: Yes    Birth control/protection: Surgical  Other Topics Concern   Not on file  Social History Narrative   Lives at home with husband. Stacy [name]).    Disabled.   Education college.   Right handed.   2 cups caffeine/daily   Social Determinants of Health   Financial Resource Strain: Low Risk  (12/10/2017)   Overall Financial Resource Strain (CARDIA)    Difficulty of Paying Living Expenses: Not hard at all  Food Insecurity: No Food Insecurity (12/10/2017)   Hunger Vital Sign    Worried About Running Out of Food in the Last Year: Never true    Ran Out of Food in the Last Year: Never true  Transportation Needs: No Transportation Needs  (12/10/2017)   PRAPARE - Administrator, Civil Service (Medical): No    Lack of Transportation (Non-Medical): No  Physical Activity: Sufficiently Active (12/10/2017)   Exercise Vital Sign    Days of Exercise per Week: 7 days    Minutes of Exercise per Session: 30 min  Stress: Stress Concern Present (12/10/2017)   [employer] of Occupational Health - Occupational Stress Questionnaire    Feeling of Stress : Very much  Social Connections: Unknown (01/31/2022)   [facility]   Social Network    Social Network: Not on file    Allergies:  Allergies  Allergen Reactions   Penicillins Hives   Amoxicillin Hives   Depakote [Divalproex Sodium]     vomiting   Lamictal [Lamotrigine]     Side effects of abdominal cramps, nausea , diarrhea ,nightmares  Metabolic Disorder Labs: Lab Results  Component Value Date   HGBA1C 5.0 11/13/2015   No results found for: "PROLACTIN" No results found for: "CHOL", "TRIG", "HDL", "CHOLHDL", "VLDL", "LDLCALC" Lab Results  Component Value Date   TSH 1.780 10/26/2015   TSH 0.525 03/15/2015    Therapeutic Level Labs: No results found for: "LITHIUM" Lab Results  Component Value Date   VALPROATE 10 (L) 07/28/2019   No results found for: "CBMZ"  Current Medications: Current Outpatient Medications  Medication Sig Dispense Refill   alendronate (FOSAMAX) 70 MG tablet 1 tablet 30 minutes before the first food, beverage or medicine of the day dissolved in 4 ounces of water     Ascorbic Acid (VITAMIN C) 100 MG tablet Take 100 mg by mouth daily.     cevimeline (EVOXAC) 30 MG capsule Take 30 mg by mouth 3 (three) times daily.     Cholecalciferol (VITAMIN D-3) 1000 units CAPS Take 1,000 Units by mouth daily.     clonazePAM (KLONOPIN) 0.5 MG tablet Take 0.5-1 tablets (0.25-0.5 mg total) by mouth daily as needed for anxiety. AS NEEDED FOR SEVERE ANXIETY ATTACKS 21 tablet 3   cyclobenzaprine (FLEXERIL) 5 MG tablet  Take 5 mg by mouth 3 (three) times daily as needed.     donepezil (ARICEPT) 10 MG tablet Take 10 mg by mouth every morning.     gabapentin (NEURONTIN) 400 MG capsule Take 4 capsules (1,600 mg total) by mouth as directed. Take 1 capsule daily AM and 1 capsule daily at noon and 2 capsules daily at bedtime 120 capsule 3   hydroxychloroquine (PLAQUENIL) 200 MG tablet Take 200 mg by mouth 2 (two) times daily.     metroNIDAZOLE (FLAGYL) 500 MG tablet Take 500 mg by mouth 3 (three) times daily.     Multiple Vitamins-Minerals (PRESERVISION AREDS) CAPS Take 1 capsule by mouth daily.     omeprazole (PRILOSEC) 40 MG capsule Take 40 mg by mouth daily.     pilocarpine (SALAGEN) 5 MG tablet Take 1 tablet by mouth 2 (two) times daily.     polyethylene glycol (MIRALAX / GLYCOLAX) 17 g packet Take 17 g by mouth daily. 14 each 0   SUMAtriptan (IMITREX) 100 MG tablet Take 100 mg by mouth every 2 (two) hours as needed for migraine.     vitamin E 45 MG (100 UNITS) capsule Take 100 Units by mouth daily.     busPIRone (BUSPAR) 10 MG tablet Take 2 tablets (20 mg total) by mouth 2 (two) times daily. 360 tablet 3   venlafaxine XR (EFFEXOR XR) 150 MG 24 hr capsule Take 1 capsule (150 mg total) by mouth daily with breakfast. 90 capsule 3   No current facility-administered medications for this visit.     Musculoskeletal: Strength & Muscle Tone: within normal limits Gait & Station: normal Patient leans: N/A  Psychiatric Specialty Exam: Review of Systems  Psychiatric/Behavioral:  Positive for decreased concentration. The patient is nervous/anxious.     Blood pressure 110/74, pulse (!) 51, temperature 98.7 F (37.1 C), temperature source Temporal, height 5\' 7"  (1.702 m), weight 159 lb 3.2 oz (72.2 kg), SpO2 94%.Body mass index is 24.93 kg/m.  General Appearance: Casual  Eye Contact:  Good  Speech:  Clear and Coherent  Volume:  Normal  Mood:  Anxious  Affect:  Full Range  Thought Process:  Goal Directed and  Descriptions of Associations: Intact  Orientation:  Full (Time, Place, and Person)  Thought Content: Logical   Suicidal Thoughts:  No  Homicidal Thoughts:  No  Memory:  Immediate;   Fair Recent;   Fair Remote;   Poor  Judgement:  Fair  Insight:  Fair  Psychomotor Activity:  Normal  Concentration:  Concentration: Fair and Attention Span: Fair  Recall:  [employer] of Knowledge: Fair  Language: Fair  Akathisia:  No  Handed:  Right  AIMS (if indicated): not done  Assets:  Desire for Improvement Housing Social Support Talents/Skills Transportation  ADL's:  Intact  Cognition: WNL  Sleep:  Fair   Screenings: Midwife Visit from 10/22/2021 in [facility] Office Visit from 07/30/2021 in [facility] Office Visit from 06/15/2018 in [facility] Office Visit from 05/20/2018 in [facility]  AIMS Total Score 0 0 5 9      GAD-7    Flowsheet Row Office Visit from 01/15/2023 in [facility] Office Visit from 11/25/2022 in [facility] Office Visit from 10/04/2022 in [facility] Office Visit from 06/24/2022 in [facility] Office Visit from 04/26/2022 in [facility]  Total GAD-7 Score 10 8 13 20 14       Mini-Mental    Flowsheet Row Office Visit from 01/01/2016 in [facility] Office Visit from 11/13/2015 in [facility] Office Visit from 10/26/2015 in [facility] Office Visit from 04/25/2015 in [facility] Office Visit from 10/25/2014 in [facility]  Total Score (max 30 points  ) 25 26 21 23 29       PHQ2-9    Flowsheet Row Office Visit from 01/15/2023 in [facility] Office Visit from 11/25/2022 in [facility] Office Visit from 10/04/2022 in [facility] Office Visit from 06/24/2022 in [facility] Office Visit from 04/26/2022 in [facility]  PHQ-2 Total Score 2 3 3 3 2   PHQ-9 Total Score 9 8 10 17 5       Flowsheet Row Office Visit from 01/15/2023 in [facility] Office Visit from 11/25/2022 in [facility] Office Visit from 10/04/2022 in [facility]  C-SSRS RISK CATEGORY No Risk No Risk No Risk        Assessment and Plan: Estell Stumpp is a 62 year old Caucasian female who has a history of bipolar disorder type II, Parkinson's disease, insomnia, cognitive disorder likely mild, Addison's disease, Sjogren syndrome was evaluated in office today.  Patient is currently stable with regards to her mood symptoms however continues to have cognitive issues, discussed plan as noted below.  Plan Bipolar disorder type II depression in remission Gabapentin 400 mg p.o. daily a.m., 400 mg p.o. daily at noon, 800 mg p.o. daily at bedtime  GAD-improving Venlafaxine 150 mg p.o. daily Gabapentin as prescribed BuSpar 20 mg p.o. twice daily Clonazepam 0.25-0.5 mg as needed-patient to limit use Reviewed [place] PMP AWARxE Patient to continue CBT with [facility]  Primary insomnia-stable Continue sleep hygiene techniques Continue gabapentin  Mild neurocognitive disorder-patient encouraged to follow up with therapist.     Collaboration of Care: Collaboration of Care: Other patient encouraged to follow up with neurology.  Patient/Guardian was  advised Release of  Information must be obtained prior to any record release in order to collaborate their care with an outside provider. Patient/Guardian was advised if they have not already done so to contact the registration department to sign all necessary forms in order for [place] to release information regarding their care.   Consent: Patient/Guardian gives verbal consent for treatment and assignment of benefits for services provided during this visit. Patient/Guardian expressed understanding and agreed to proceed.   Follow-up in clinic in 3 months or sooner if needed.  This note was generated in part or whole with voice recognition software. Voice recognition is usually quite accurate but there are transcription errors that can and very often do occur. I apologize for any typographical errors that were not detected and corrected.    Jomarie Longs, MD 01/15/2023, 4:25 PM

## 2023-03-13 ENCOUNTER — Telehealth: Payer: Self-pay | Admitting: Family

## 2023-03-13 ENCOUNTER — Ambulatory Visit (INDEPENDENT_AMBULATORY_CARE_PROVIDER_SITE_OTHER): Payer: Managed Care, Other (non HMO) | Admitting: Family

## 2023-03-13 ENCOUNTER — Encounter: Payer: Self-pay | Admitting: Family

## 2023-03-13 VITALS — BP 124/78 | HR 81 | Temp 98.2°F | Ht 67.0 in | Wt 158.8 lb

## 2023-03-13 DIAGNOSIS — F3176 Bipolar disorder, in full remission, most recent episode depressed: Secondary | ICD-10-CM | POA: Diagnosis not present

## 2023-03-13 DIAGNOSIS — Z13220 Encounter for screening for lipoid disorders: Secondary | ICD-10-CM

## 2023-03-13 DIAGNOSIS — R5383 Other fatigue: Secondary | ICD-10-CM

## 2023-03-13 DIAGNOSIS — M8580 Other specified disorders of bone density and structure, unspecified site: Secondary | ICD-10-CM

## 2023-03-13 DIAGNOSIS — Z1159 Encounter for screening for other viral diseases: Secondary | ICD-10-CM

## 2023-03-13 DIAGNOSIS — E039 Hypothyroidism, unspecified: Secondary | ICD-10-CM

## 2023-03-13 DIAGNOSIS — Z136 Encounter for screening for cardiovascular disorders: Secondary | ICD-10-CM

## 2023-03-13 DIAGNOSIS — M3501 Sicca syndrome with keratoconjunctivitis: Secondary | ICD-10-CM

## 2023-03-13 DIAGNOSIS — E785 Hyperlipidemia, unspecified: Secondary | ICD-10-CM

## 2023-03-13 NOTE — Telephone Encounter (Signed)
Call [facility] and get colonoscopy, done in 2024 Please faxed to [place]   : [phone]

## 2023-03-13 NOTE — Telephone Encounter (Signed)
Call [facility] , cardioilogy, Dr Annalee Genta,   We need recent OV's

## 2023-03-13 NOTE — Patient Instructions (Signed)
Referral to pulmonology  Let us know if you dont hear back within a week in regards to an appointment being scheduled.   So that you are aware, if you are [facility] MyChart user , please pay attention to your MyChart messages as you may receive a MyChart message with a phone number to call and schedule this test/appointment own your own from our referral coordinator. This is a new process so I do not want you to miss this message.  If you are not a MyChart user, you will receive a phone call.

## 2023-03-13 NOTE — Progress Notes (Signed)
Assessment & Plan:  Other fatigue Assessment & Plan: Etiology of fatigue nonspecific at this time.  Discussed likely multifactorial.  Exercise is limited due to fatigue.  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  Advised continued follow-up with cardiology.  Discussed untreated hypothyroidism and currently call out to endocrine,Dr Rolith.  Concern for sleep apnea in setting of migraine, pending referral to pulmonology.  We also discussed polypharmacy in particular gabapentin and its sedating properties.  Advised her to discuss /consider slight change in gabapentin dose with Dr. Elna Breslow.  Mammogram is up-to-date.  Awaiting colonoscopy report, Requesting records. Pending labs.  close follow-up.  Orders: -     B12 and Folate Panel -     CBC with Differential/Platelet -     T4, free -     Comprehensive metabolic panel -     TSH -     Lipid panel -     VITAMIN D 25 Hydroxy (Vit-D Deficiency, Fractures) -     Hepatitis C antibody -     Ambulatory referral to Pulmonology  Osteopenia, unspecified location Assessment & Plan: Compliant with Fosamax without trouble swallowing.  Advised to continue vitamin D and calcium.  Awaiting on record of DEXA.  She will be due for DEXA and plan to discuss at follow up.   Orders: -     VITAMIN D 25 Hydroxy (Vit-D Deficiency, Fractures)  Sjogren's syndrome with keratoconjunctivitis sicca (HCC) -     Ambulatory referral to Rheumatology  Bipolar disorder, in full remission, most recent episode depressed (HCC) Assessment & Plan: Chronic, stable.   She is following with Dr Elna Breslow  Continue gabapentin 400 mg p.o. daily a.m., 400 mg p.o. daily at noon, 800 mg p.o. daily at bedtime  , clonazepam 0.25-0.5 mg every day prn, BuSpar 20 mg BID , effexor 150mg  as managed by psychiatry.  Will follow   Acquired hypothyroidism Assessment & Plan: Previously on Synthroid 200 mcg daily; she is following with endocrinology, Dr Beatrix Fetters.  Call out to  clarify if patient was to decrease Synthroid dose versus completely stop medication.  Discussed concern with patient in regards to fatigue with being entirely off of Synthroid.   Pending TSH  Orders: -     T4, free -     TSH  Hyperlipidemia, unspecified hyperlipidemia type  Encounter for lipid screening for cardiovascular disease -     Lipid panel  Encounter for hepatitis C screening test for low risk patient -     Hepatitis C antibody     Return precautions given.   Risks, benefits, and alternatives of the medications and treatment plan prescribed today were discussed, and patient expressed understanding.   Education regarding symptom management and diagnosis given to patient on AVS either electronically or printed.  Return in about 6 weeks (around 04/24/2023) for Fasting labs in 2-3 weeks.  Rennie Plowman, FNP  Subjective:    Patient ID: Stacy [name], female    DOB: 1960-07-18, 63 y.o.   MRN: [id number]  CC: Stacy [name] is a 63 y.o. female who presents today to establish care.    HPI: Complains of chronic fatigue 12 years ago, episodic.  Energy is better when she doesn't exercise.   Wakes up feeling refreshed .  She takes adult exercise class in the morning and may do the dishes , and then worn out.    Mammogram is UTD 06/2022    Reports colonoscopy 1 year ago.  Tdap due  Started fosamax in 2024  She is able to swallow without difficulty. Compliant with vit D and Calcium.   No upcoming dental procedures.   GAD, bipolar  and she is following with Dr Elna Breslow  Compliant with gabapentin 400 mg p.o. daily a.m., 400 mg p.o. daily at noon, 800 mg p.o. daily at bedtime  , clonazepam 0.25-0.5 mg every day prn, BuSpar 20 mg BID , effexor 150mg   H/o sjogren's and following with rheumatology Dr Kathi Ludwig  Following with endocrinology for hypothyroidism,osteoporosis  Discussed adrenal insufficiency and ACTH stimulation test 10/04/2022 Dr Beatrix Fetters; cortisol normal  10/09/22  She is followed with neurology, last seen 06/12/2022 for blacking out spells, migraine.  EEG without evidence of epileptiform discharges.  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     busPIRone (BUSPAR) 10 MG tablet Take 2 tablets (20 mg total) by mouth 2 (two) times daily. 360 tablet 3   cevimeline (EVOXAC) 30 MG capsule Take 30 mg by mouth 3 (three) times daily.     Cholecalciferol (VITAMIN D-3) 1000 units CAPS Take 1,000 Units by mouth daily.     clonazePAM (KLONOPIN) 0.5 MG tablet Take 0.5-1 tablets (0.25-0.5 mg total) by mouth daily as needed for anxiety. AS NEEDED FOR SEVERE ANXIETY ATTACKS 21 tablet 3   donepezil (ARICEPT) 10 MG tablet Take 10 mg by mouth every morning.     hydroxychloroquine (PLAQUENIL) 200 MG tablet Take 200 mg by mouth 2 (two) times daily.     Multiple Vitamins-Minerals (PRESERVISION AREDS) CAPS Take 1 capsule by mouth daily.     omeprazole (PRILOSEC) 40 MG capsule Take 40 mg by mouth daily.     pilocarpine (SALAGEN) 5 MG tablet Take 1 tablet by mouth 2 (two) times daily.     polyethylene glycol (MIRALAX / GLYCOLAX) 17 g packet Take 17 g by mouth daily. 14 each 0   rosuvastatin (CRESTOR) 10 MG tablet Take 10 mg by mouth daily.     SUMAtriptan (IMITREX) 100 MG tablet Take 100 mg by mouth every 2 (two) hours as needed for migraine.     venlafaxine XR (EFFEXOR XR) 150 MG 24 hr capsule Take 1 capsule (150 mg total) by mouth daily with breakfast. 90 capsule 3   vitamin E 45 MG (100  UNITS) capsule Take 100 Units by mouth daily.     gabapentin (NEURONTIN) 400 MG capsule Take 4 capsules (1,600 mg total) by mouth as directed. Take 1 capsule daily AM and 1 capsule daily at noon and 2 capsules daily at bedtime 120 capsule 3   No current facility-administered medications on file prior to visit.    Review of Systems  Constitutional:  Positive for fatigue. Negative for chills, fever and unexpected weight change.  Respiratory:  Negative for cough.   Cardiovascular:  Negative for chest pain and palpitations.  Gastrointestinal:  Negative for nausea and vomiting.      Objective:    BP 124/78   Pulse 81   Temp 98.2 F (36.8 C) (Oral)   Ht 5\' 7"  (1.702 m)  Wt 158 lb 12.8 oz (72 kg)   SpO2 99%   BMI 24.87 kg/m  BP Readings from Last 3 Encounters:  03/13/23 124/78  10/25/22 101/62  01/29/22 102/66   Wt Readings from Last 3 Encounters:  03/13/23 158 lb 12.8 oz (72 kg)  09/13/21 157 lb (71.2 kg)  09/13/20 156 lb (70.8 kg)    Physical Exam Vitals reviewed.  Constitutional:      Appearance: She is well-developed.  Eyes:     Conjunctiva/sclera: Conjunctivae normal.  Neck:     Thyroid: No thyroid mass or thyromegaly.  Cardiovascular:     Rate and Rhythm: Normal rate and regular rhythm.     Pulses: Normal pulses.     Heart sounds: Normal heart sounds.  Pulmonary:     Effort: Pulmonary effort is normal.     Breath sounds: Normal breath sounds. No wheezing, rhonchi or rales.  Lymphadenopathy:     Head:     Right side of head: No submental, submandibular, tonsillar, preauricular, posterior auricular or occipital adenopathy.     Left side of head: No submental, submandibular, tonsillar, preauricular, posterior auricular or occipital adenopathy.     Cervical: No cervical adenopathy.  Skin:    General: Skin is warm and dry.  Neurological:     Mental Status: She is alert.  Psychiatric:        Speech: Speech normal.        Behavior: Behavior normal.         Thought Content: Thought content normal.

## 2023-03-13 NOTE — Telephone Encounter (Signed)
Call [facility] endocrine dr Standley Brooking   Pt establishing care with me today She has completely stopped synthroid 3 months ago.   I am confused by this. Did Dr Standley Brooking advise to decrease or stop dose ?  Last TSH 10/09/22 0.323

## 2023-03-13 NOTE — Telephone Encounter (Signed)
Call [facility] and get last DEXA  Pt states done in last 2 years

## 2023-03-17 ENCOUNTER — Encounter: Payer: Self-pay | Admitting: Family

## 2023-03-18 ENCOUNTER — Encounter: Payer: Self-pay | Admitting: *Deleted

## 2023-03-18 NOTE — Telephone Encounter (Signed)
Records release has been faxed to [facility]

## 2023-03-18 NOTE — Telephone Encounter (Signed)
I have reordered labs correctly and placed in red folder for signature.

## 2023-03-18 NOTE — Telephone Encounter (Signed)
Records requested via efax

## 2023-03-18 NOTE — Addendum Note (Signed)
Addended by: Sandy Salaam on: 03/18/2023 04:36 PM   Modules accepted: Orders

## 2023-03-18 NOTE — Assessment & Plan Note (Signed)
Chronic, stable.   She is following with Dr Elna Breslow  Continue gabapentin 400 mg p.o. daily a.m., 400 mg p.o. daily at noon, 800 mg p.o. daily at bedtime  , clonazepam 0.25-0.5 mg every day prn, BuSpar 20 mg BID , effexor 150mg  as managed by psychiatry.  Will follow

## 2023-03-18 NOTE — Assessment & Plan Note (Signed)
Compliant with Fosamax without trouble swallowing.  Advised to continue vitamin D and calcium.  Awaiting on record of DEXA.  She will be due for DEXA and plan to discuss at follow up.

## 2023-03-18 NOTE — Assessment & Plan Note (Signed)
Previously on Synthroid 200 mcg daily; she is following with endocrinology, Dr Beatrix Fetters.  Call out to clarify if patient was to decrease Synthroid dose versus completely stop medication.  Discussed concern with patient in regards to fatigue with being entirely off of Synthroid.   Pending TSH

## 2023-03-18 NOTE — Assessment & Plan Note (Addendum)
Etiology of fatigue nonspecific at this time.  Discussed likely multifactorial.  Exercise is limited due to fatigue.  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  Advised continued follow-up with cardiology.  Discussed untreated hypothyroidism and currently call out to endocrine,Dr Rolith.  Concern for sleep apnea in setting of migraine, pending referral to pulmonology.  We also discussed polypharmacy in particular gabapentin and its sedating properties.  Advised her to discuss /consider slight change in gabapentin dose with Dr. Elna Breslow.  Mammogram is up-to-date.  Awaiting colonoscopy report, Requesting records. Pending labs.  close follow-up.

## 2023-03-18 NOTE — Telephone Encounter (Signed)
Please call [facility] endocrinology  Did dr Beatrix Fetters intend for patient to stop Synthroid completely 3 months ago?  She is no longer on Synthroid after her last follow-up. Did she intend for her to decrease synthroid dose instead of stopping?  I wanted to clarify as patient has complained of profound fatigue

## 2023-03-18 NOTE — Telephone Encounter (Signed)
Faxed question to  Dr Standley Brooking office-awaiting response

## 2023-03-18 NOTE — Telephone Encounter (Signed)
Records request sent.

## 2023-03-19 ENCOUNTER — Telehealth: Payer: Self-pay

## 2023-03-19 NOTE — Telephone Encounter (Signed)
Copied from CRM [id number]. Topic: Clinical - Request for Lab/Test Order >> Mar 19, 2023 11:44 AM Marica Otter wrote: Reason for CRM: Patient is calling to pick up a copy of the labs ordered by provider, wants to go to a [facility] by her house. Thanks  Masae [phone]

## 2023-03-20 ENCOUNTER — Telehealth: Payer: Self-pay

## 2023-03-20 NOTE — Telephone Encounter (Signed)
Noted  

## 2023-03-20 NOTE — Telephone Encounter (Signed)
SEE previous note placed in folder upfront for pickup

## 2023-03-20 NOTE — Telephone Encounter (Signed)
Spoke to pt and she will come by to pick up req forms. Placed in folder upfront

## 2023-03-20 NOTE — Telephone Encounter (Signed)
Copied from CRM [id number]. Topic: Clinical - Request for Lab/Test Order >> Mar 20, 2023 11:47 AM Josefa Half C wrote: Reason for CRM: Patient called to see if requested copies of the lab order was ready for pick up. Please follow up with patient [phone]

## 2023-03-20 NOTE — Telephone Encounter (Signed)
Placed in folder upfront for pickup

## 2023-03-21 ENCOUNTER — Encounter: Payer: Self-pay | Admitting: Podiatry

## 2023-03-21 ENCOUNTER — Ambulatory Visit (INDEPENDENT_AMBULATORY_CARE_PROVIDER_SITE_OTHER): Payer: Managed Care, Other (non HMO) | Admitting: Podiatry

## 2023-03-21 DIAGNOSIS — M79674 Pain in right toe(s): Secondary | ICD-10-CM

## 2023-03-21 DIAGNOSIS — B351 Tinea unguium: Secondary | ICD-10-CM

## 2023-03-21 NOTE — Telephone Encounter (Signed)
close

## 2023-03-21 NOTE — Progress Notes (Signed)
Chief Complaint  Patient presents with   Nail Problem    "I have a toenail that seems to keep growing into the skin.  My husband was able to pull it out to give me some relief." N - ingrown toenail L - 2nd digit right D - couple of weeks O - suddenly, gotten better C - constant sharp pain, digging into the toe A - standing T - husband pulled it out    HPI: 63 y.o. female presenting today for evaluation of a symptomatic toenail to the right second digit.  History of ingrown toenails.  Patient states that the end of her toe is very painful and tender especially in close toed shoes with activity.  Past Medical History:  Diagnosis Date   Aneurysm (HCC)    Anxiety    Arthritis    Brain stem lesion    Chronic kidney disease    kideny stone   Depression    Diverticulitis    Family history of adverse reaction to anesthesia    mother has N/V   Fibromyalgia    GERD (gastroesophageal reflux disease)    Headache    Hypotension    Hypothyroid    Memory loss    Migraine    Raynaud disease    Renal stone    Sjogren's syndrome (HCC)    Thyroid disease     Past Surgical History:  Procedure Laterality Date   ABDOMINAL HYSTERECTOMY     2014 for noncancerous reasons d/t endometriosis; no cervix, ovaries.   BASAL CELL CARCINOMA EXCISION     BREAST BIOPSY Left    CESAREAN SECTION     1982, 1984   CYSTOSCOPY WITH STENT PLACEMENT Bilateral 08/01/2020   Procedure: CYSTOSCOPY WITH STENT PLACEMENT;  Surgeon: Vanna Scotland, MD;  Location: [facility] ORS;  Service: Urology;  Laterality: Bilateral;   CYSTOSCOPY/URETEROSCOPY/HOLMIUM LASER/STENT PLACEMENT Bilateral 08/14/2020   Procedure: CYSTOSCOPY/URETEROSCOPY/HOLMIUM LASER/STENT PLACEMENT;  Surgeon: Vanna Scotland, MD;  Location: [facility] ORS;  Service: Urology;  Laterality: Bilateral;   EYE SURGERY Right    cataract   KNEE SURGERY Left    torn meniscus   LITHOTRIPSY     MUSCLE BIOPSY     OOPHORECTOMY      Allergies  Allergen Reactions    Penicillins Hives   Amoxicillin Hives   Depakote [Divalproex Sodium]     vomiting   Lamictal [Lamotrigine]     Side effects of abdominal cramps, nausea , diarrhea ,nightmares     Physical Exam: General: The patient is alert and oriented x3 in no acute distress.  Dermatology: Skin is warm, dry and supple bilateral lower extremities.  The right second digit toenail is thickened and dystrophic with associated tenderness with palpation.  It also is elongated and seems to curve around the distal end of the toe.  No open wound.  Clinically no concern or indication of infection or ingrowing portion of nail to the medial lateral nail borders  Vascular: Palpable pedal pulses bilaterally. Capillary refill within normal limits.  No appreciable edema.  No erythema.  Neurological: Grossly intact via light touch  Musculoskeletal Exam: No pedal deformities noted  Assessment/Plan of Care: 1.  Pain due to thickened toenail right second digit 2.  H/o partial permanent nail matrixectomy RT MED hallux.  LT MED 2nd toe  -Patient evaluated -Explained to the patient that the toe would feel significantly better if the nail was debrided shorter.  The second toe is the longest of all of the digits and likely  rubs against her shoes during activity.  Recommend keeping the nail trimmed short -Mechanical debridement of the nail plate was performed today using a nail nipper and rotary bur without incident or bleeding.  Patient felt immediate relief -Return to clinic as needed       Felecia Shelling, DPM [facility]  Dr. Felecia Shelling, DPM    [address] [place], [place] [address]                Office [phone]  Fax [phone]

## 2023-03-22 LAB — CBC WITH DIFFERENTIAL/PLATELET
Basophils Absolute: 0 10*3/uL (ref 0.0–0.2)
Basos: 1 %
EOS (ABSOLUTE): 0.1 10*3/uL (ref 0.0–0.4)
Eos: 2 %
Hematocrit: 40.7 % (ref 34.0–46.6)
Hemoglobin: 13.5 g/dL (ref 11.1–15.9)
Immature Grans (Abs): 0 10*3/uL (ref 0.0–0.1)
Immature Granulocytes: 0 %
Lymphocytes Absolute: 1.5 10*3/uL (ref 0.7–3.1)
Lymphs: 34 %
MCH: 31.7 pg (ref 26.6–33.0)
MCHC: 33.2 g/dL (ref 31.5–35.7)
MCV: 96 fL (ref 79–97)
Monocytes Absolute: 0.4 10*3/uL (ref 0.1–0.9)
Monocytes: 10 %
Neutrophils Absolute: 2.4 10*3/uL (ref 1.4–7.0)
Neutrophils: 53 %
Platelets: 200 10*3/uL (ref 150–450)
RBC: 4.26 x10E6/uL (ref 3.77–5.28)
RDW: 12.3 % (ref 11.7–15.4)
WBC: 4.4 10*3/uL (ref 3.4–10.8)

## 2023-03-22 LAB — B12 AND FOLATE PANEL
Folate: 19.8 ng/mL (ref >3.0)
Vitamin B-12: 1263 pg/mL — ABNORMAL HIGH (ref 232–1245)

## 2023-03-22 LAB — HEPATITIS C ANTIBODY: Hep C Virus Ab: NONREACTIVE

## 2023-03-22 LAB — COMPREHENSIVE METABOLIC PANEL
ALT: 11 [IU]/L (ref 0–32)
AST: 17 [IU]/L (ref 0–40)
Albumin: 4.4 g/dL (ref 3.9–4.9)
Alkaline Phosphatase: 104 [IU]/L (ref 44–121)
BUN/Creatinine Ratio: 17 (ref 12–28)
BUN: 14 mg/dL (ref 8–27)
Bilirubin Total: 0.4 mg/dL (ref 0.0–1.2)
CO2: 22 mmol/L (ref 20–29)
Calcium: 9.4 mg/dL (ref 8.7–10.3)
Chloride: 104 mmol/L (ref 96–106)
Creatinine, Ser: 0.81 mg/dL (ref 0.57–1.00)
Globulin, Total: 1.8 g/dL (ref 1.5–4.5)
Glucose: 90 mg/dL (ref 70–99)
Potassium: 4.4 mmol/L (ref 3.5–5.2)
Sodium: 141 mmol/L (ref 134–144)
Total Protein: 6.2 g/dL (ref 6.0–8.5)
eGFR: 83 mL/min/{1.73_m2} (ref >59)

## 2023-03-22 LAB — LIPID PANEL
Chol/HDL Ratio: 1.9 {ratio} (ref 0.0–4.4)
Cholesterol, Total: 143 mg/dL (ref 100–199)
HDL: 77 mg/dL (ref >39)
LDL Chol Calc (NIH): 55 mg/dL (ref 0–99)
Triglycerides: 49 mg/dL (ref 0–149)
VLDL Cholesterol Cal: 11 mg/dL (ref 5–40)

## 2023-03-22 LAB — TSH: TSH: 6.29 u[IU]/mL — ABNORMAL HIGH (ref 0.450–4.500)

## 2023-03-22 LAB — T4, FREE: Free T4: 0.91 ng/dL (ref 0.82–1.77)

## 2023-03-22 LAB — VITAMIN D 25 HYDROXY (VIT D DEFICIENCY, FRACTURES): Vit D, 25-Hydroxy: 31.7 ng/mL (ref 30.0–100.0)

## 2023-03-26 ENCOUNTER — Encounter: Payer: Self-pay | Admitting: Family

## 2023-03-26 NOTE — Telephone Encounter (Signed)
[facility] clinic Endocrinology was on hold about 15 mins will call back tomorrow on 03/27/23

## 2023-03-27 NOTE — Telephone Encounter (Signed)
Called and they stated that that Dr. Beatrix Fetters and nurse is on there Monday and Wednesday.

## 2023-04-02 ENCOUNTER — Telehealth: Payer: Self-pay | Admitting: Family

## 2023-04-02 NOTE — Telephone Encounter (Signed)
 Spoke to receptionist @ [facility] and she took my name and number to have Dr Monta Anton nurse call me back.

## 2023-04-02 NOTE — Telephone Encounter (Signed)
 Spoke to [place] @ Dr Monta Anton office they will reach out to pt and contact us  back if they need to faxed over TSH labs also

## 2023-04-02 NOTE — Telephone Encounter (Signed)
 Copied from CRM [id number]. Topic: General - Call Back - No Documentation >> Apr 02, 2023  9:50 AM Elita Guitar wrote: Reason for CRM: Camilo Cella from [facility] called to return Margaret's nurse call. Please call back and advise at [phone].

## 2023-04-02 NOTE — Telephone Encounter (Signed)
Spoke to [place] @ Dr Standley Brooking office she stated that they will contact the pt to see what reasoning was for her stopping the Synthroid. Faxed labs to her as well.

## 2023-04-04 ENCOUNTER — Institutional Professional Consult (permissible substitution): Payer: Managed Care, Other (non HMO) | Admitting: Sleep Medicine

## 2023-04-07 ENCOUNTER — Encounter: Payer: Self-pay | Admitting: Sleep Medicine

## 2023-04-07 ENCOUNTER — Ambulatory Visit: Payer: Managed Care, Other (non HMO) | Admitting: Sleep Medicine

## 2023-04-07 VITALS — BP 96/60 | HR 88 | Temp 97.7°F | Ht 67.0 in | Wt 162.0 lb

## 2023-04-07 DIAGNOSIS — R0683 Snoring: Secondary | ICD-10-CM | POA: Diagnosis not present

## 2023-04-07 DIAGNOSIS — F411 Generalized anxiety disorder: Secondary | ICD-10-CM

## 2023-04-07 DIAGNOSIS — G4733 Obstructive sleep apnea (adult) (pediatric): Secondary | ICD-10-CM

## 2023-04-07 DIAGNOSIS — E039 Hypothyroidism, unspecified: Secondary | ICD-10-CM | POA: Diagnosis not present

## 2023-04-07 NOTE — Patient Instructions (Signed)
 Will complete a home sleep study and follow up to review results. Do not drive drowsy for yourself or others.

## 2023-04-07 NOTE — Telephone Encounter (Signed)
 Spoke to opt and she did hear back from Dr Monta Anton office and she stated that they did put her on a new medication but she was not at home she could not remember the name of it but she would let me know

## 2023-04-07 NOTE — Progress Notes (Signed)
 Name:Stacy [name] MRN: [id number] DOB: Sep 04, 1960   CHIEF COMPLAINT:  EXCESSIVE DAYTIME SLEEPINESS   HISTORY OF PRESENT ILLNESS:  Stacy [name] is a 63 y.o. w/ a h/o migraine headaches, bipolar disorder, anxiety, depression, hypothyroidism and Sjogren's disorder who presents for c/o loud snoring and excessive daytime sleepiness which has been present for several years. Reports feeling unrefreshed upon awakening most days. Reports nocturnal awakenings due to nocturia, however does not have difficulty falling back to sleep. Denies any significant weight changes. Admits to morning headaches, dry mouth and night sweats. Denies a family history of sleep apnea. Denies drowsy driving. Drinks 1 glass of tea daily, denies alcohol, tobacco or illicit drug use.   Bedtime 8 pm Sleep onset 10 mins Rise time 5 am   EPWORTH SLEEP SCORE 7     No data to display           PAST MEDICAL HISTORY :   has a past medical history of Aneurysm (HCC), Anxiety, Arthritis, Brain stem lesion, Chronic kidney disease, Depression, Diverticulitis, Family history of adverse reaction to anesthesia, Fibromyalgia, GERD (gastroesophageal reflux disease), Headache, Hypotension, Hypothyroid, Memory loss, Migraine, Raynaud disease, Renal stone, Sjogren's syndrome (HCC), and Thyroid  disease.  has a past surgical history that includes Cesarean section; Eye surgery (Right); Excision basal cell carcinoma; Lithotripsy; Abdominal hysterectomy; Muscle biopsy; Oophorectomy; Breast biopsy (Left); Knee surgery (Left); Cystoscopy with stent placement (Bilateral, 08/01/2020); and Cystoscopy/ureteroscopy/holmium laser/stent placement (Bilateral, 08/14/2020). Prior to Admission medications   Medication Sig Start Date End Date Taking? Authorizing Provider  alendronate (FOSAMAX) 70 MG tablet 1 tablet 30 minutes before the first food, beverage or medicine of the day dissolved in 4 ounces of water    [provider]  Ascorbic  Acid (VITAMIN C) 100 MG tablet Take 100 mg by mouth daily.    [provider]  busPIRone  (BUSPAR ) 10 MG tablet Take 2 tablets (20 mg total) by mouth 2 (two) times daily. 01/15/23   Eappen, Saramma, MD  cevimeline (EVOXAC) 30 MG capsule Take 30 mg by mouth 3 (three) times daily. 12/08/20   [provider]  Cholecalciferol  (VITAMIN D -3) 1000 units CAPS Take 1,000 Units by mouth daily.    [provider]  clonazePAM  (KLONOPIN ) 0.5 MG tablet Take 0.5-1 tablets (0.25-0.5 mg total) by mouth daily as needed for anxiety. AS NEEDED FOR SEVERE ANXIETY ATTACKS 11/25/22   Eappen, Saramma, MD  donepezil  (ARICEPT ) 10 MG tablet Take 10 mg by mouth every morning.    [provider]  gabapentin  (NEURONTIN ) 400 MG capsule Take 4 capsules (1,600 mg total) by mouth as directed. Take 1 capsule daily AM and 1 capsule daily at noon and 2 capsules daily at bedtime 11/25/22 01/24/23  Eappen, Saramma, MD  hydroxychloroquine  (PLAQUENIL ) 200 MG tablet Take 200 mg by mouth 2 (two) times daily. 06/03/19   [provider]  Multiple Vitamins-Minerals (PRESERVISION AREDS) CAPS Take 1 capsule by mouth daily.    [provider]  omeprazole  (PRILOSEC) 40 MG capsule Take 40 mg by mouth daily. 12/24/19   [provider]  pilocarpine (SALAGEN) 5 MG tablet Take 1 tablet by mouth 2 (two) times daily.    [provider]  polyethylene glycol (MIRALAX  / GLYCOLAX ) 17 g packet Take 17 g by mouth daily. 08/03/20   Garnet Just, MD  rosuvastatin (CRESTOR) 10 MG tablet Take 10 mg by mouth daily.    [provider]  SUMAtriptan  (IMITREX ) 100 MG tablet Take 100 mg by mouth  every 2 (two) hours as needed for migraine. 05/06/18   [provider]  venlafaxine  XR (EFFEXOR  XR) 150 MG 24 hr capsule Take 1 capsule (150 mg total) by mouth daily with breakfast. 01/15/23   Eappen, Saramma, MD  vitamin E 45 MG (100 UNITS) capsule Take 100 Units by mouth daily.    [provider]   Allergies  Allergen Reactions   Penicillins Hives   Amoxicillin Hives   Depakote  [Divalproex  Sodium]     vomiting   Lamictal  [Lamotrigine ]     Side effects of abdominal cramps, nausea , diarrhea ,nightmares    FAMILY HISTORY:  family history includes Alcohol abuse in her father and sister; Anxiety disorder in her maternal grandmother, mother, and sister; Cancer in her father and paternal aunt; Depression in her maternal grandmother, mother, and sister; Diabetes in her brother and maternal grandfather; Drug abuse in her brother and sister; Heart attack in her father; Hypertension in her father; Lupus in her paternal aunt; Migraines in her mother; Stroke in her father; Thyroid  disease in her paternal aunt. SOCIAL HISTORY:  reports that she has never smoked. She has never used smokeless tobacco. She reports that she does not drink alcohol and does not use drugs.   Review of Systems:  Gen:  Denies  fever, sweats, chills weight loss  HEENT: Denies blurred vision, double vision, ear pain, eye pain, hearing loss, nose bleeds, sore throat Cardiac:  No dizziness, chest pain or heaviness, chest tightness,edema, No JVD Resp:   No cough, -sputum production, -shortness of breath,-wheezing, -hemoptysis,  Gi: Denies swallowing difficulty, stomach pain, nausea or vomiting, diarrhea, constipation, bowel incontinence Gu:  Denies bladder incontinence, burning urine Ext:   Denies Joint pain, stiffness or swelling Skin: Denies  skin rash, easy bruising or bleeding or hives Endoc:  Denies polyuria, polydipsia , polyphagia or weight change Psych:   Denies depression, insomnia or hallucinations  Other:  All other systems negative  VITAL SIGNS: BP 96/60 (BP Location: Left Arm, Patient Position: Sitting, Cuff Size: Normal)   Pulse 88   Temp 97.7 F (36.5 C) (Temporal)   Ht 5\' 7"  (1.702 m)   Wt 162 lb (73.5 kg)   SpO2 99%   BMI 25.37 kg/m     Physical Examination:   General  Appearance: No distress  EYES PERRLA, EOM intact.   NECK Supple, No JVD Throat Mallampati IV Pulmonary: normal breath sounds, No wheezing.  CardiovascularNormal S1,S2.  No m/r/g.   Abdomen: Benign, Soft, non-tender. Skin:   warm, no rashes, no ecchymosis  Extremities: normal, no cyanosis, clubbing. Neuro:without focal findings,  speech normal  PSYCHIATRIC: Mood, affect within normal limits.   ASSESSMENT AND PLAN  OSA I suspect that OSA is likely present due to clinical presentation. Discussed the consequences of untreated sleep apnea. Advised not to drive drowsy for safety of patient and others. Will complete further evaluation with a home sleep study and follow up to review results.    Hypothyroidism Stable, on current management. Following with PCP.   Anxiety Stable, on current management.    MEDICATION ADJUSTMENTS/LABS AND TESTS ORDERED: Recommend Sleep Study   Patient  satisfied with Plan of action and management. All questions answered  Follow up to review HST results and treatment plan.   I spent a total of  33 minutes reviewing chart data, face-to-face evaluation with the patient, counseling and coordination of care as detailed above.    Naod Sweetland, M.D.  Sleep Medicine [facility]

## 2023-04-08 ENCOUNTER — Ambulatory Visit: Payer: Managed Care, Other (non HMO)

## 2023-04-08 ENCOUNTER — Ambulatory Visit: Payer: Managed Care, Other (non HMO) | Admitting: Family

## 2023-04-08 ENCOUNTER — Other Ambulatory Visit: Payer: Self-pay

## 2023-04-08 ENCOUNTER — Telehealth: Payer: Self-pay | Admitting: Family

## 2023-04-08 ENCOUNTER — Encounter: Payer: Self-pay | Admitting: Family

## 2023-04-08 VITALS — BP 122/76 | HR 78 | Temp 97.9°F | Ht 67.0 in | Wt 162.4 lb

## 2023-04-08 DIAGNOSIS — G8929 Other chronic pain: Secondary | ICD-10-CM

## 2023-04-08 DIAGNOSIS — M545 Low back pain, unspecified: Secondary | ICD-10-CM

## 2023-04-08 DIAGNOSIS — E039 Hypothyroidism, unspecified: Secondary | ICD-10-CM | POA: Diagnosis not present

## 2023-04-08 DIAGNOSIS — M8580 Other specified disorders of bone density and structure, unspecified site: Secondary | ICD-10-CM | POA: Diagnosis not present

## 2023-04-08 MED ORDER — OMEPRAZOLE 40 MG PO CPDR: 40.0000 mg | DELAYED_RELEASE_CAPSULE | Freq: Every day | ORAL | 2 refills | Status: DC

## 2023-04-08 NOTE — Progress Notes (Unsigned)
Assessment & Plan:  Chronic lumbosacral pain Assessment & Plan: Chronic, worsening.  Anticipate arthritic changes lumbar spine, hips and knees.  We discussed scheduling Tylenol arthritis 2 tablets in the morning, 2 tablets in the evening.  Patient politely declines physical therapy at this time.  Consider pain management referral for intervention.  Discussed icing, the role of exercise. Close follow up.   Orders: -     DG Lumbar Spine Complete; Future -     DG HIPS BILAT W OR W/O PELVIS 3-4 VIEWS; Future -     DG Knee Complete 4 Views Right; Future -     DG Knee Complete 4 Views Left; Future  Hypothyroidism, unspecified type Assessment & Plan: She has resumed Synthroid 200 mcg as managed by endocrinology. will follow.    Osteopenia, unspecified location Assessment & Plan: Attempting again to get bone density record by calling [facility].  If unsuccessful, we will go ahead and reorder bone density so we can assess if Fosamax is needed.  Currently she has stopped Fosamax      Return precautions given.   Risks, benefits, and alternatives of the medications and treatment plan prescribed today were discussed, and patient expressed understanding.   Education regarding symptom management and diagnosis given to patient on AVS either electronically or printed.  No follow-ups on file.  Rennie Plowman, FNP  Subjective:    Patient ID: Stacy [name], female    DOB: 05/04/1960, 63 y.o.   MRN: [id number]  CC: [name] is a 63 y.o. female who presents today for follow up.   HPI: Complains of bilateral knee pain, low back pain x 2 weeks  Right knee pain is worse,   No falls, injury.   She is able to do low impact class at the gym, but intense cardio class aggrevates joint.   No rash, fever.   She ibuprofen 400mg  twice daily most days with minimal relief.       gabapentin 400 mg p.o. daily a.m., 400 mg p.o. daily at noon, 800 mg p.o. daily at bedtime , clonazepam  0.25-0.5 mg every day prn, BuSpar 20 mg BID , effexor 150mg  as managed by psychiatry    She has resumed synthroid as managed by Dr Beatrix Fetters Following with rheumatology, Dr Kathi Ludwig for osteopenia, Sjogren's. Trigger point injection for back pain and advised to send back to specialist for possible MRI and further management.  Discussed with therapy hip pain. She is no longer taking Fosamax   Seen yesterday pulmonology for evaluation of sleep apnea Allergies: Penicillins, Amoxicillin, Depakote [divalproex sodium], and Lamictal [lamotrigine] Current Outpatient Medications on File Prior to Visit  Medication Sig Dispense Refill   alendronate (FOSAMAX) 70 MG tablet 1 tablet 30 minutes before the first food, beverage or medicine of the day dissolved in 4 ounces of water (Patient not taking: Reported on 04/08/2023)     Ascorbic Acid (VITAMIN C) 100 MG tablet Take 100 mg by mouth daily.     busPIRone (BUSPAR) 10 MG tablet Take 2 tablets (20 mg total) by mouth 2 (two) times daily. 360 tablet 3   Cholecalciferol (VITAMIN D-3) 1000 units CAPS Take 1,000 Units by mouth daily.     clonazePAM (KLONOPIN) 0.5 MG tablet Take 0.5-1 tablets (0.25-0.5 mg total) by mouth daily as needed for anxiety. AS NEEDED FOR SEVERE ANXIETY ATTACKS 21 tablet 3   gabapentin (NEURONTIN) 400 MG capsule Take 4 capsules (1,600 mg total) by mouth as directed. Take 1 capsule daily AM and 1  capsule daily at noon and 2 capsules daily at bedtime 120 capsule 3   hydroxychloroquine (PLAQUENIL) 200 MG tablet Take 200 mg by mouth 2 (two) times daily.     levothyroxine (SYNTHROID) 200 MCG tablet Take 200 mcg by mouth daily.     Multiple Vitamins-Minerals (PRESERVISION AREDS) CAPS Take 1 capsule by mouth daily.     omeprazole (PRILOSEC) 40 MG capsule Take 40 mg by mouth daily.     pilocarpine (SALAGEN) 5 MG tablet Take 1 tablet by mouth 2 (two) times daily.     polyethylene glycol (MIRALAX / GLYCOLAX) 17 g packet Take 17 g by mouth daily. 14 each 0    rosuvastatin (CRESTOR) 10 MG tablet Take 10 mg by mouth daily.     SUMAtriptan (IMITREX) 100 MG tablet Take 100 mg by mouth every 2 (two) hours as needed for migraine.     venlafaxine XR (EFFEXOR XR) 150 MG 24 hr capsule Take 1 capsule (150 mg total) by mouth daily with breakfast. 90 capsule 3   vitamin E 45 MG (100 UNITS) capsule Take 100 Units by mouth daily.     No current facility-administered medications on file prior to visit.    Review of Systems    Objective:    BP 122/76   Pulse 78   Temp 97.9 F (36.6 C) (Oral)   Ht 5\' 7"  (1.702 m)   Wt 162 lb 6.4 oz (73.7 kg)   SpO2 98%   BMI 25.44 kg/m  BP Readings from Last 3 Encounters:  04/08/23 122/76  04/07/23 96/60  03/13/23 124/78   Wt Readings from Last 3 Encounters:  04/08/23 162 lb 6.4 oz (73.7 kg)  04/07/23 162 lb (73.5 kg)  03/13/23 158 lb 12.8 oz (72 kg)    Physical Exam Vitals reviewed.  Constitutional:      Appearance: She is well-developed.  Eyes:     Conjunctiva/sclera: Conjunctivae normal.  Cardiovascular:     Rate and Rhythm: Normal rate and regular rhythm.     Pulses: Normal pulses.     Heart sounds: Normal heart sounds.  Pulmonary:     Effort: Pulmonary effort is normal.     Breath sounds: Normal breath sounds. No wheezing, rhonchi or rales.  Musculoskeletal:     Lumbar back: No swelling, edema, spasms, tenderness or bony tenderness. Normal range of motion.     Comments: Full range of motion with flexion, tension, lateral side bends.  Diffuse tenderness lumbar sacral joint.  No pain over SI joint No pain, numbness, tingling elicited with single leg raise bilaterally.  Bilateral Hip: No limp or waddling gait. Full ROM with flexion and hip rotation in flexion.   No pain of lateral hip with  (flexion-abduction-external rotation) test. Pain in groin with FAER test bilaterally.  No pain with deep palpation of greater trochanter.     Skin:    General: Skin is warm and dry.  Neurological:      Mental Status: She is alert.     Sensory: No sensory deficit.     Deep Tendon Reflexes:     Reflex Scores:      Patellar reflexes are 2+ on the right side and 2+ on the left side.    Comments: Sensation and strength intact bilateral lower extremities.  Psychiatric:        Speech: Speech normal.        Behavior: Behavior normal.        Thought Content: Thought content normal.

## 2023-04-08 NOTE — Assessment & Plan Note (Signed)
Attempting again to get bone density record by calling [facility].  If unsuccessful, we will go ahead and reorder bone density so we can assess if Fosamax is needed.  Currently she has stopped Fosamax

## 2023-04-08 NOTE — Telephone Encounter (Signed)
Call [facility] ( previous primary) and get DEXA scan sent to our office

## 2023-04-08 NOTE — Assessment & Plan Note (Addendum)
Chronic, worsening.  Anticipate arthritic changes lumbar spine, hips and knees.  We discussed scheduling Tylenol arthritis 2 tablets in the morning, 2 tablets in the evening.  Patient politely declines physical therapy at this time.  Consider pain management referral for intervention.  Discussed icing, the role of exercise. Close follow up.

## 2023-04-08 NOTE — Telephone Encounter (Signed)
Spoke to pt and she informed me that it was Stacy [name] on [employer], Phone # [phone] Fax # [phone]. Faxed the [facility] form so they can fax DEXA report to [place]

## 2023-04-08 NOTE — Patient Instructions (Signed)
As discussed, let's start by scheduling Tylenol Arthritis which is a 650mg  tablet .   You may take 1-2 tablets every 8 hours ( scheduled) with maximum of 6 tablets per day. Most adults can safely take 4 pills total per day of Tylenol Arthritis 650mg  tablet. Do not exceed 6 tablets a day of Tylenol Arthritis 650mg  tablet   For example , you could take two tablets in the morning ( 8am) and then two tablets again at 4pm.   Maximum daily dose of acetaminophen 4 g per day from all sources.  If you are taking another medication which includes acetaminophen (Tylenol) which may be in cough and cold preparations or pain medication such as Percocet, you will need to factor that into your total daily dose to be safe.  Please let me know if any questions  A great article regarding how to safely take and dose tylenol found below.  Title : 'Acetaminophen safety: Be cautious but not afraid'  https://www.health.https://gentry.org/  Nice to see you.

## 2023-04-08 NOTE — Assessment & Plan Note (Signed)
She has resumed Synthroid 200 mcg as managed by endocrinology. will follow.

## 2023-04-12 DIAGNOSIS — G4733 Obstructive sleep apnea (adult) (pediatric): Secondary | ICD-10-CM

## 2023-04-17 ENCOUNTER — Telehealth: Payer: 59 | Admitting: Psychiatry

## 2023-04-18 ENCOUNTER — Ambulatory Visit: Payer: Self-pay | Admitting: Family

## 2023-04-18 ENCOUNTER — Encounter: Payer: Self-pay | Admitting: Family Medicine

## 2023-04-18 ENCOUNTER — Ambulatory Visit: Payer: Managed Care, Other (non HMO) | Admitting: Family Medicine

## 2023-04-18 VITALS — BP 104/80 | HR 71 | Temp 98.6°F | Wt 158.6 lb

## 2023-04-18 DIAGNOSIS — J029 Acute pharyngitis, unspecified: Secondary | ICD-10-CM | POA: Diagnosis not present

## 2023-04-18 LAB — POCT RAPID STREP A (OFFICE): Rapid Strep A Screen: NEGATIVE

## 2023-04-18 LAB — POC INFLUENZA A&B (BINAX/QUICKVUE)
Influenza A, POC: NEGATIVE
Influenza B, POC: NEGATIVE

## 2023-04-18 LAB — POC COVID19 BINAXNOW: SARS Coronavirus 2 Ag: NEGATIVE

## 2023-04-18 MED ORDER — IBUPROFEN 200 MG/10ML PO SUSP: 800.0000 mg | Freq: Three times a day (TID) | ORAL | 0 refills | Status: DC | PRN

## 2023-04-18 NOTE — Telephone Encounter (Signed)
 Noted. Plan to see today.

## 2023-04-18 NOTE — Telephone Encounter (Signed)
  Chief Complaint: sore throat Symptoms: dry cough, sore throat (painful swallowing food), swollen lymph nodes to head/neck, headache Frequency: cough x 3 days, sore throat, headache x 1 day Pertinent Negatives: Patient denies drooling, runny nose, fever, difficulty breathing, rash. Disposition: [] ED /[] Urgent Care (no appt availability in office) / [x] Appointment(In office/virtual)/ []  [facility]/ [] Home Care/ [] Refused Recommended Disposition /[] [facility]/ []  Follow-up with PCP Additional Notes: Patient states she is able to drink water but states it was too painful to eat oatmeal. Patient states she has been drinking plenty of fluids. Patient has been treating with warm salt water gargles and throat lozenges. Patient agreeable to office visit this morning and advised to wear a mask.  Copied from CRM [id number]. Topic: Clinical - Red Word Triage >> Apr 18, 2023  7:40 AM Adele Barthel wrote: Red Word that prompted transfer to Nurse Triage:   Severe throat and can't swallow(8/10 pain scale) and headache started yesterday.  Taking throat lozenges and gargling salt water. Reason for Disposition  SEVERE (e.g., excruciating) throat pain  Answer Assessment - Initial Assessment Questions 1. ONSET: "When did the throat start hurting?" (Hours or days ago)      Teacher, English as a foreign language.  2. SEVERITY: "How bad is the sore throat?" (Scale 1-10; mild, moderate or severe)   - MILD (1-3):  Doesn't interfere with eating or normal activities.   - MODERATE (4-7): Interferes with eating some solids and normal activities.   - SEVERE (8-10):  Excruciating pain, interferes with most normal activities.   - SEVERE WITH DYSPHAGIA (10): Can't swallow liquids, drooling.     Painful to swallow, 8/10. Throat lozenges and gargling with salt water for pain.  3. STREP EXPOSURE: "Has there been any exposure to strep within the past week?" If Yes, ask: "What type of contact occurred?"      She states her friends are  all sick with colds and is unsure if any of them have strep.  4.  VIRAL SYMPTOMS: "Are there any symptoms of a cold, such as a runny nose, cough, hoarse voice or red eyes?"      Dry cough x 3 days, hoarse voice.  5. FEVER: "Do you have a fever?" If Yes, ask: "What is your temperature, how was it measured, and when did it start?"     Denies.  6. PUS ON THE TONSILS: "Is there pus on the tonsils in the back of your throat?"     She states they look red.  7. OTHER SYMPTOMS: "Do you have any other symptoms?" (e.g., difficulty breathing, headache, rash)     Headache.  Protocols used: Sore Throat-A-AH

## 2023-04-18 NOTE — Telephone Encounter (Signed)
Patient is scheduled with provider today at 9:40 AM. Stacy [name]

## 2023-04-18 NOTE — Assessment & Plan Note (Addendum)
Sore throat is likely viral in nature.  COVID, flu, and strep testing all negative today.  Discussed supportive care with pain control with Tylenol and/or ibuprofen in liquid formulation to help with throat discomfort.  Discussed patient could alternate Tylenol and ibuprofen.  Advised to take the ibuprofen with food.  Advised if this is not beneficial for her pain she should let us know.  Advised if she has any progressively worsening symptoms she should be reevaluated.  Discussed typically this type of illness starts to improve within 5 to 7 days and resolves on its own.

## 2023-04-18 NOTE — Progress Notes (Signed)
Marikay Alar, MD Phone: [phone]  Stacy [name] is a 63 y.o. female who presents today for same-day visit.  Sore throat: Patient onset of symptoms over the last few days.  Sore throat is the most bothersome symptom.  Has mild cough.  Mild body aches and fatigue.  Some headache.  No postnasal drip, congestion, fever, shortness of breath, loss of taste, or loss of smell.  Denies COVID, flu, or strep exposure, though does note she has been around somebody with a bad cold.  Tylenol does help though she has trouble getting it down to the discomfort with swallowing.  Throat lozenges and salt water have not been beneficial.  Social History   Tobacco Use  Smoking Status Never  Smokeless Tobacco Never    Current Outpatient Medications on File Prior to Visit  Medication Sig Dispense Refill   alendronate (FOSAMAX) 70 MG tablet      Ascorbic Acid (VITAMIN C) 100 MG tablet Take 100 mg by mouth daily.     busPIRone (BUSPAR) 10 MG tablet Take 2 tablets (20 mg total) by mouth 2 (two) times daily. 360 tablet 3   Cholecalciferol (VITAMIN D-3) 1000 units CAPS Take 1,000 Units by mouth daily.     clonazePAM (KLONOPIN) 0.5 MG tablet Take 0.5-1 tablets (0.25-0.5 mg total) by mouth daily as needed for anxiety. AS NEEDED FOR SEVERE ANXIETY ATTACKS 21 tablet 3   hydroxychloroquine (PLAQUENIL) 200 MG tablet Take 200 mg by mouth 2 (two) times daily.     levothyroxine (SYNTHROID) 200 MCG tablet Take 200 mcg by mouth daily.     Multiple Vitamins-Minerals (PRESERVISION AREDS) CAPS Take 1 capsule by mouth daily.     omeprazole (PRILOSEC) 40 MG capsule Take 1 capsule (40 mg total) by mouth daily. 30 capsule 2   pilocarpine (SALAGEN) 5 MG tablet Take 1 tablet by mouth 2 (two) times daily.     polyethylene glycol (MIRALAX / GLYCOLAX) 17 g packet Take 17 g by mouth daily. 14 each 0   rosuvastatin (CRESTOR) 10 MG tablet Take 10 mg by mouth daily.     SUMAtriptan (IMITREX) 100 MG tablet Take 100 mg by mouth every  2 (two) hours as needed for migraine.     venlafaxine XR (EFFEXOR XR) 150 MG 24 hr capsule Take 1 capsule (150 mg total) by mouth daily with breakfast. 90 capsule 3   vitamin E 45 MG (100 UNITS) capsule Take 100 Units by mouth daily.     gabapentin (NEURONTIN) 400 MG capsule Take 4 capsules (1,600 mg total) by mouth as directed. Take 1 capsule daily AM and 1 capsule daily at noon and 2 capsules daily at bedtime 120 capsule 3   No current facility-administered medications on file prior to visit.     ROS see history of present illness  Objective  Physical Exam Vitals:   04/18/23 0933  BP: 104/80  Pulse: 71  Temp: 98.6 F (37 C)  SpO2: 96%    BP Readings from Last 3 Encounters:  04/18/23 104/80  04/08/23 122/76  04/07/23 96/60   Wt Readings from Last 3 Encounters:  04/18/23 158 lb 9.6 oz (71.9 kg)  04/08/23 162 lb 6.4 oz (73.7 kg)  04/07/23 162 lb (73.5 kg)    Physical Exam Constitutional:      General: She is not in acute distress.    Appearance: She is not diaphoretic.  HENT:     Mouth/Throat:     Mouth: Mucous membranes are moist.     Pharynx:  Posterior oropharyngeal erythema present. No oropharyngeal exudate.  Cardiovascular:     Rate and Rhythm: Normal rate and regular rhythm.     Heart sounds: Normal heart sounds.  Pulmonary:     Effort: Pulmonary effort is normal.     Breath sounds: Normal breath sounds.  Skin:    General: Skin is warm and dry.  Neurological:     Mental Status: She is alert.      Assessment/Plan: Please see individual problem list.  Sore throat Assessment & Plan: Sore throat is likely viral in nature.  COVID, flu, and strep testing all negative today.  Discussed supportive care with pain control with Tylenol and/or ibuprofen in liquid formulation to help with throat discomfort.  Discussed patient could alternate Tylenol and ibuprofen.  Advised to take the ibuprofen with food.  Advised if this is not beneficial for her pain she should  let us know.  Advised if she has any progressively worsening symptoms she should be reevaluated.  Discussed typically this type of illness starts to improve within 5 to 7 days and resolves on its own.  Orders: -     POC Influenza A&B(BINAX/QUICKVUE) -     POC COVID-19 BinaxNow -     POCT rapid strep A -     Ibuprofen; Take 800 mg by mouth every 8 (eight) hours as needed.  Dispense: 600 mL; Refill: 0     Return if symptoms worsen or fail to improve.   Marikay Alar, MD [facility] Primary Care [facility]

## 2023-04-21 ENCOUNTER — Encounter: Payer: Self-pay | Admitting: Family

## 2023-04-22 ENCOUNTER — Other Ambulatory Visit: Payer: Managed Care, Other (non HMO)

## 2023-04-22 DIAGNOSIS — R0683 Snoring: Secondary | ICD-10-CM | POA: Diagnosis not present

## 2023-04-23 ENCOUNTER — Other Ambulatory Visit: Payer: Self-pay

## 2023-04-23 DIAGNOSIS — G4733 Obstructive sleep apnea (adult) (pediatric): Secondary | ICD-10-CM

## 2023-04-24 ENCOUNTER — Ambulatory Visit: Payer: Managed Care, Other (non HMO) | Admitting: Family

## 2023-04-25 ENCOUNTER — Other Ambulatory Visit: Payer: Managed Care, Other (non HMO)

## 2023-05-08 ENCOUNTER — Telehealth (INDEPENDENT_AMBULATORY_CARE_PROVIDER_SITE_OTHER): Payer: 59 | Admitting: Psychiatry

## 2023-05-08 DIAGNOSIS — F09 Unspecified mental disorder due to known physiological condition: Secondary | ICD-10-CM

## 2023-05-08 DIAGNOSIS — F5101 Primary insomnia: Secondary | ICD-10-CM

## 2023-05-08 DIAGNOSIS — F411 Generalized anxiety disorder: Secondary | ICD-10-CM

## 2023-05-08 DIAGNOSIS — F3176 Bipolar disorder, in full remission, most recent episode depressed: Secondary | ICD-10-CM

## 2023-05-08 NOTE — Progress Notes (Signed)
 Patient was unable to connect due to connection issues at the time of appointment.  Patient appointment rescheduled.

## 2023-05-13 ENCOUNTER — Ambulatory Visit (INDEPENDENT_AMBULATORY_CARE_PROVIDER_SITE_OTHER): Admitting: Psychiatry

## 2023-05-13 ENCOUNTER — Encounter: Payer: Self-pay | Admitting: Psychiatry

## 2023-05-13 VITALS — BP 107/71 | HR 85 | Temp 99.0°F | Ht 65.5 in | Wt 161.0 lb

## 2023-05-13 DIAGNOSIS — F3176 Bipolar disorder, in full remission, most recent episode depressed: Secondary | ICD-10-CM | POA: Diagnosis not present

## 2023-05-13 DIAGNOSIS — F411 Generalized anxiety disorder: Secondary | ICD-10-CM | POA: Diagnosis not present

## 2023-05-13 DIAGNOSIS — F5101 Primary insomnia: Secondary | ICD-10-CM

## 2023-05-13 DIAGNOSIS — F09 Unspecified mental disorder due to known physiological condition: Secondary | ICD-10-CM | POA: Diagnosis not present

## 2023-05-13 MED ORDER — GABAPENTIN 400 MG PO CAPS
1600.0000 mg | ORAL_CAPSULE | ORAL | 3 refills | Status: DC
Start: 2023-05-13 — End: 2023-12-10

## 2023-05-13 NOTE — Patient Instructions (Addendum)
 [facility] [address] [place], [place], [place] [address] [phone] [email or web address]  Sonya carter [phone]  [email or web address]  [email or web address]  [email or web address] [facility] [address], [place], [place] [address]   [phone]  [facility] [email or web address] [address] [place], [place] [address]  [phone]  [facility] [email or web address] [address], [place], [place] [address]  [phone]   [facility] - [phone]  [facility] counseling [phone]  [facility] - [phone]

## 2023-05-13 NOTE — Progress Notes (Unsigned)
 BH MD OP Progress Note  05/13/2023 12:52 PM Chaunte Hornbeck  MRN:  [id number]  Chief Complaint:  Chief Complaint  Patient presents with   Follow-up   Depression   Anxiety   Medication Refill   HPI: Esmeralda Malay is a 63 year old Caucasian female, currently unemployed, lives in [place], has a history of bipolar disorder, GAD, mild cognitive disorder, primary insomnia, neuroleptic induced Parkinson's, Addison's disease, Sjogren syndrome, hypertension, hypothyroidism, arthritis, migraine headaches, seizure-like spells was evaluated in office today for a follow-up appointment.  She experiences significant joint inflammation and chronic fatigue, which she describes as 'completely draining.' These symptoms have made it difficult for her to perform daily activities, including going to the gym and doing housework. Her knee joints, hands, and lower back are swollen, and she is currently wearing a sleeve to help with the swelling. She was diagnosed with Sjogren's syndrome 15 years ago, initially presenting with chronic fatigue and brain fog.   She recently started seeing a new rheumatologist and began taking leflunomide 10 mg once daily on May 05, 2023. She is also undergoing various tests to check for other potential issues related to Sjogren's syndrome.  She continues to experience anxiety and some depression, which she attributes to her current health condition. She is currently taking venlafaxine 150 mg, gabapentin 400 mg in the morning, 400 mg at noon, and 800 mg at bedtime, BuSpar 20 mg twice a day, and clonazepam as needed, which she uses a couple of times a week. She is considering increasing the frequency of clonazepam use to manage her anxiety better.  She lives in [place] and is currently seeking a new therapist as she does not connect well with her current one at [facility]. She has been in therapy for five months, attending sessions at least once a month in person.   Denies  suicidality, homicidality or perceptual disturbances.  Visit Diagnosis:    ICD-10-CM   1. Bipolar disorder, in full remission, most recent episode depressed (HCC)  F31.76    Type II    2. GAD (generalized anxiety disorder)  F41.1 gabapentin (NEURONTIN) 400 MG capsule    3. Mild cognitive disorder  F09     4. Primary insomnia  F51.01       Past Psychiatric History: I have reviewed past psychiatric history from progress note on 12/10/2017.  Past trials of Paxil, Wellbutrin, risperidone, Latuda, Prolixin, Seroquel, Abilify, Zyprexa, Invega, Topamax, Lexapro, Belsomra, Depakote, Rexulti-could not afford it.  Neuropsychological testing-per Dr. Rodenbough-07/10/2021-not consistent with conditions like Alzheimer's, Lewy body dementia or other subcortical dementia.  Cognitive issues likely due to psychiatric condition.    Past Medical History:  Past Medical History:  Diagnosis Date   Aneurysm (HCC)    Anxiety    Arthritis    Brain stem lesion    Chronic kidney disease    kideny stone   Depression    Diverticulitis    Family history of adverse reaction to anesthesia    mother has N/V   Fibromyalgia    GERD (gastroesophageal reflux disease)    Headache    Hypotension    Hypothyroid    Memory loss    Migraine    Raynaud disease    Renal stone    Sjogren's syndrome (HCC)    Thyroid disease     Past Surgical History:  Procedure Laterality Date   ABDOMINAL HYSTERECTOMY     2014 for noncancerous reasons d/t endometriosis; no cervix, ovaries.   BASAL CELL CARCINOMA EXCISION  BREAST BIOPSY Left    CESAREAN SECTION     1982, 1984   CYSTOSCOPY WITH STENT PLACEMENT Bilateral 08/01/2020   Procedure: CYSTOSCOPY WITH STENT PLACEMENT;  Surgeon: Vanna Scotland, MD;  Location: [facility];  Service: Urology;  Laterality: Bilateral;   CYSTOSCOPY/URETEROSCOPY/HOLMIUM LASER/STENT PLACEMENT Bilateral 08/14/2020   Procedure: CYSTOSCOPY/URETEROSCOPY/HOLMIUM LASER/STENT PLACEMENT;  Surgeon:  Vanna Scotland, MD;  Location: [facility];  Service: Urology;  Laterality: Bilateral;   EYE SURGERY Right    cataract   KNEE SURGERY Left    torn meniscus   LITHOTRIPSY     MUSCLE BIOPSY     OOPHORECTOMY      Family Psychiatric History: I have reviewed family psychiatric history from progress note on 12/10/2017.  Family History:  Family History  Problem Relation Age of Onset   Migraines Mother    Anxiety disorder Mother    Depression Mother    Cancer Father    Hypertension Father    Stroke Father    Heart attack Father    Alcohol abuse Father    Diabetes Brother    Alcohol abuse Sister    Drug abuse Sister    Anxiety disorder Sister    Depression Sister    Drug abuse Brother    Cancer Paternal Aunt    Lupus Paternal Aunt    Thyroid disease Paternal Aunt    Diabetes Maternal Grandfather    Anxiety disorder Maternal Grandmother    Depression Maternal Grandmother     Social History: I have reviewed social history from progress note on 12/10/2017. Social History   Socioeconomic History   Marital status: Married    Spouse name: Maisie Fus   Number of children: 2   Years of education: 14   Highest education level: Associate degree: occupational, Scientist, product/process development, or vocational program  Occupational History   Occupation: Disabled  Tobacco Use   Smoking status: Never   Smokeless tobacco: Never  Vaping Use   Vaping status: Never Used  Substance and Sexual Activity   Alcohol use: No    Alcohol/week: 0.0 standard drinks of alcohol   Drug use: Never   Sexual activity: Yes    Birth control/protection: Surgical  Other Topics Concern   Not on file  Social History Narrative   Lives at home with husband. Maisie Fus).    Disabled since 2005      Education college.   Right handed.   2 cups caffeine/daily    2 children         She grew up  in [place] large part life in [place] her for husbands' work at [employer]      Enjoys reading historical fiction, going to  the gym.    Social Drivers of Corporate investment banker Strain: Low Risk  (05/05/2023)   Received from [facility]   Overall Financial Resource Strain (CARDIA)    Difficulty of Paying Living Expenses: Not hard at all  Food Insecurity: No Food Insecurity (05/05/2023)   Received from [facility]   Hunger Vital Sign    Worried About Running Out of Food in the Last Year: Never true    Ran Out of Food in the Last Year: Never true  Transportation Needs: No Transportation Needs (05/05/2023)   Received from [facility] - Transportation    In the past 12 months, has lack of transportation kept you from medical appointments  or from getting medications?: No    Lack of Transportation (Non-Medical): No  Physical Activity: Sufficiently Active (12/10/2017)   Exercise Vital Sign    Days of Exercise per Week: 7 days    Minutes of Exercise per Session: 30 min  Stress: Stress Concern Present (12/10/2017)   [employer] of Occupational Health - Occupational Stress Questionnaire    Feeling of Stress : Very much  Social Connections: Unknown (01/31/2022)   [facility]   Social Network    Social Network: Not on file    Allergies:  Allergies  Allergen Reactions   Penicillins Hives   Amoxicillin Hives   Depakote [Divalproex Sodium]     vomiting   Lamictal [Lamotrigine]     Side effects of abdominal cramps, nausea , diarrhea ,nightmares    Metabolic Disorder Labs: Lab Results  Component Value Date   HGBA1C 5.0 11/13/2015   No results found for: "PROLACTIN" Lab Results  Component Value Date   CHOL 143 03/21/2023   TRIG 49 03/21/2023   HDL 77 03/21/2023   CHOLHDL 1.9 03/21/2023   LDLCALC 55 03/21/2023   Lab Results  Component Value Date   TSH 6.290 (H) 03/21/2023   TSH 1.780 10/26/2015    Therapeutic Level Labs: No results found for: "LITHIUM" Lab Results  Component Value Date    VALPROATE 10 (L) 07/28/2019   No results found for: "CBMZ"  Current Medications: Current Outpatient Medications  Medication Sig Dispense Refill   alendronate (FOSAMAX) 70 MG tablet      Ascorbic Acid (VITAMIN C) 100 MG tablet Take 100 mg by mouth daily.     busPIRone (BUSPAR) 10 MG tablet Take 2 tablets (20 mg total) by mouth 2 (two) times daily. 360 tablet 3   cevimeline (EVOXAC) 30 MG capsule Take 30 mg by mouth daily.     Cholecalciferol (VITAMIN D-3) 1000 units CAPS Take 1,000 Units by mouth daily.     clonazePAM (KLONOPIN) 0.5 MG tablet Take 0.5-1 tablets (0.25-0.5 mg total) by mouth daily as needed for anxiety. AS NEEDED FOR SEVERE ANXIETY ATTACKS 21 tablet 3   hydroxychloroquine (PLAQUENIL) 200 MG tablet Take 200 mg by mouth 2 (two) times daily.     Ibuprofen 200 MG/10ML SUSP Take 800 mg by mouth every 8 (eight) hours as needed. 600 mL 0   leflunomide (ARAVA) 10 MG tablet Take 1 tablet by mouth daily.     levothyroxine (SYNTHROID) 200 MCG tablet Take 200 mcg by mouth daily.     Multiple Vitamins-Minerals (PRESERVISION AREDS) CAPS Take 1 capsule by mouth daily.     omeprazole (PRILOSEC) 40 MG capsule Take 1 capsule (40 mg total) by mouth daily. 30 capsule 2   pilocarpine (SALAGEN) 5 MG tablet Take 1 tablet by mouth 2 (two) times daily.     polyethylene glycol (MIRALAX / GLYCOLAX) 17 g packet Take 17 g by mouth daily. 14 each 0   rosuvastatin (CRESTOR) 10 MG tablet Take 10 mg by mouth daily.     SUMAtriptan (IMITREX) 100 MG tablet Take 100 mg by mouth every 2 (two) hours as needed for migraine.     venlafaxine XR (EFFEXOR XR) 150 MG 24 hr capsule Take 1 capsule (150 mg total) by mouth daily with breakfast. 90 capsule 3   vitamin E 45 MG (100 UNITS) capsule Take 100 Units by mouth daily.     gabapentin (NEURONTIN) 400 MG capsule Take 4 capsules (1,600 mg total) by mouth as directed. Take  1 capsule daily AM and 1 capsule daily at noon and 2 capsules daily at bedtime 120 capsule 3   No  current facility-administered medications for this visit.     Musculoskeletal: Strength & Muscle Tone: within normal limits Gait & Station: normal Patient leans: N/A  Psychiatric Specialty Exam: Review of Systems  Psychiatric/Behavioral:  The patient is nervous/anxious.     Blood pressure 107/71, pulse 85, temperature 99 F (37.2 C), height 5' 5.5" (1.664 m), weight 161 lb (73 kg), SpO2 98%.Body mass index is 26.38 kg/m.  General Appearance: Casual  Eye Contact:  Fair  Speech:  Clear and Coherent  Volume:  Normal  Mood:  Anxious and sad about current health problems  Affect:  Congruent  Thought Process:  Goal Directed and Descriptions of Associations: Intact  Orientation:  Full (Time, Place, and Person)  Thought Content: Logical   Suicidal Thoughts:  No  Homicidal Thoughts:  No  Memory:  Immediate;   Fair Recent;   Fair Remote;   Poor  Judgement:  Fair  Insight:  Fair  Psychomotor Activity:  Normal  Concentration:  Concentration: Fair and Attention Span: Fair  Recall:  [employer] of Knowledge: Fair  Language: Fair  Akathisia:  No  Handed:  Right  AIMS (if indicated): not done  Assets:  Communication Skills Desire for Improvement Housing Social Support  ADL's:  Intact  Cognition: WNL  Sleep:  Fair   Screenings: Midwife Visit from 10/22/2021 in [facility] Office Visit from 07/30/2021 in [facility] Office Visit from 06/15/2018 in [facility] Office Visit from 05/20/2018 in [facility]  AIMS Total Score 0 0 5 9      GAD-7    Flowsheet Row Office Visit from 05/13/2023 in [facility] Office Visit from 04/08/2023 in [facility] [employer] Visit from 03/13/2023 in [facility] [employer] Visit from 01/15/2023 in [facility] Office Visit from 11/25/2022 in [facility]  Total GAD-7 Score 1 0 0 10 8      Mini-Mental    Flowsheet Row Office Visit from 01/01/2016 in [facility] Office Visit from 11/13/2015 in [facility] Office Visit from 10/26/2015 in [facility] Office Visit from 04/25/2015 in [facility] Office Visit from 10/25/2014 in [facility]  Total Score (max 30 points ) 25 26 21 23 29       PHQ2-9    Flowsheet Row Office Visit from 05/13/2023 in [facility] Office Visit from 04/18/2023 in [facility] Visit from 04/08/2023 in [facility] [employer] Visit from 03/13/2023 in [facility] [employer] Visit from 01/15/2023 in [facility]  PHQ-2 Total Score 2 0 0 0 2  PHQ-9 Total Score 10 3 0 0 9      Flowsheet Row Office Visit from 05/13/2023 in [facility] Office Visit from 01/15/2023 in [facility] Office Visit from 11/25/2022 in [facility]  C-SSRS RISK CATEGORY No Risk No Risk No Risk  Assessment and Plan: Ileigh Mettler is a 63 year old Caucasian female who has a history of bipolar disorder type II, Parkinson's disease, insomnia, cognitive disorder likely mild, Addison's disease, Sjogren syndrome was evaluated in office today.  Discussed assessment and plan as noted below.  Bipolar Disorder in remission Bipolar disorder is in remission. No current concerns about mood symptoms, anxiety, or depression. -  Continue Gabapentin 400 mg daily a.m., 400 mg at noon and 800 mg at bedtime.  Generalized Anxiety Disorder-unstable Anxiety symptoms have been present for a couple of weeks, likely exacerbated by health issues related to Sjogren's syndrome. She experiences situational anxiety and some depression due to chronic fatigue and joint pain. Prefers not to increase medication dosage to avoid potential side effects and interactions with new medications for Sjogren's syndrome. Clonazepam can be used as needed to manage anxiety symptoms. - Use Clonazepam as needed, up to 3-4 times a week, preferably at the end of the day if it causes tiredness. - Consider splitting clonazepam 0.5 mg into half if needed. - Continue venlafaxine 150 mg daily - Continue BuSpar 20 mg twice daily - Continue CBT with current therapist ([facility]), provided resources for other therapist in the community.  Primary insomnia-stable Currently reports sleep is overall good. - Continue sleep hygiene techniques - Continue Gabapentin 800 mg at bedtime  Cognitive disorder-chronic Neuropsychological testing has ruled out Alzheimer's and Lewy body dementia. She is seeking a new therapist due to a lack of connection with the current one. Resources for finding a new therapist in the [place] area were discussed. - Provide resources for finding a new therapist in the [place] area. - Encourage staying with the current therapist until a new one is found.  Follow-up in clinic in 2 months or sooner if needed.   Collaboration of Care: Collaboration of Care: Referral or follow-up with counselor/therapist AEB patient provider resources in the community to establish care with new therapist.  Patient/Guardian was advised Release of Information must be obtained prior to any record release in order to collaborate their care with an outside provider. Patient/Guardian was advised if they have not already done so to contact the registration  department to sign all necessary forms in order for [place] to release information regarding their care.   Consent: Patient/Guardian gives verbal consent for treatment and assignment of benefits for services provided during this visit. Patient/Guardian expressed understanding and agreed to proceed.  Discussed the use of a AI scribe software for clinical note transcription with the patient, who gave verbal consent to proceed.  This note was generated in part or whole with voice recognition software. Voice recognition is usually quite accurate but there are transcription errors that can and very often do occur. I apologize for any typographical errors that were not detected and corrected.     Jomarie Longs, MD 05/13/2023, 12:52 PM

## 2023-05-14 ENCOUNTER — Encounter: Payer: Self-pay | Admitting: Psychiatry

## 2023-06-05 ENCOUNTER — Telehealth: Payer: Self-pay

## 2023-06-05 NOTE — Telephone Encounter (Signed)
 received fax requesting a refill on the clonazepam. pt was last seen on 3-18 next appt 5-28

## 2023-06-06 NOTE — Telephone Encounter (Signed)
 pt does have refill but trying to get early. will be able to refill on the 15th

## 2023-06-06 NOTE — Telephone Encounter (Signed)
 It looks like there is one more refill available .

## 2023-06-09 ENCOUNTER — Telehealth: Payer: Self-pay

## 2023-06-09 DIAGNOSIS — F411 Generalized anxiety disorder: Secondary | ICD-10-CM

## 2023-06-09 MED ORDER — CLONAZEPAM 0.5 MG PO TABS: 0.2500 mg | ORAL_TABLET | Freq: Every day | ORAL | 3 refills | Status: DC | PRN

## 2023-06-09 NOTE — Telephone Encounter (Signed)
 pt called states that she needs a refill on the clonazepam. pt was last seen on 3-18 next appt 5-28

## 2023-06-09 NOTE — Telephone Encounter (Signed)
 called pharmacy pt last filled rx on 3-15 has no refills.

## 2023-06-09 NOTE — Telephone Encounter (Signed)
I have sent clonazepam to pharmacy. 

## 2023-06-09 NOTE — Telephone Encounter (Signed)
 Pt.notified

## 2023-07-01 ENCOUNTER — Ambulatory Visit (INDEPENDENT_AMBULATORY_CARE_PROVIDER_SITE_OTHER)

## 2023-07-01 VITALS — BP 118/60 | HR 79 | Temp 98.5°F | Ht 67.0 in | Wt 164.8 lb

## 2023-07-01 DIAGNOSIS — R350 Frequency of micturition: Secondary | ICD-10-CM | POA: Diagnosis not present

## 2023-07-01 DIAGNOSIS — R3915 Urgency of urination: Secondary | ICD-10-CM | POA: Insufficient documentation

## 2023-07-01 DIAGNOSIS — R32 Unspecified urinary incontinence: Secondary | ICD-10-CM | POA: Insufficient documentation

## 2023-07-01 LAB — POC URINALSYSI DIPSTICK (AUTOMATED)
Bilirubin, UA: NEGATIVE
Glucose, UA: NEGATIVE
Ketones, UA: NEGATIVE
Nitrite, UA: NEGATIVE
Protein, UA: NEGATIVE
Spec Grav, UA: 1.01 (ref 1.010–1.025)
Urobilinogen, UA: 0.2 U/dL
pH, UA: 7 (ref 5.0–8.0)

## 2023-07-01 LAB — URINALYSIS, ROUTINE W REFLEX MICROSCOPIC
Bilirubin Urine: NEGATIVE
Ketones, ur: NEGATIVE
Nitrite: NEGATIVE
RBC / HPF: NONE SEEN (ref 0–?)
Specific Gravity, Urine: <=1.005 — AB (ref 1.000–1.030)
Total Protein, Urine: NEGATIVE
Urine Glucose: NEGATIVE
Urobilinogen, UA: 0.2 (ref 0.0–1.0)
pH: 7 (ref 5.0–8.0)

## 2023-07-01 NOTE — Assessment & Plan Note (Signed)
 D/D includes UTI, genitourinary syndrome of menopause, nephrolithiasis, diverticulosis. We will obtain UA, urine culture.  If UA suggestive for UTI recommend treatment with Macrobid 100 mg, BID for 5 days when waiting for urine culture.  If hematuria and no suggestion for UTI consider treatment with Flomax 0.4 mg once a day, with urology referral.  Also recommend f/u with PCP in 2 weeks for pelvic exam and see if patient will benefit from vaginal estrogen cream for vaginal dryness.  Red flag symptoms discussed: significant abdominal pain, vomiting, unable to eat, fever, chills and recommend emergent evaluation

## 2023-07-01 NOTE — Assessment & Plan Note (Signed)
 Plan per urinary urgency

## 2023-07-01 NOTE — Progress Notes (Signed)
 Acute Office Visit  Subjective:    Patient ID: Stacy [name], female    DOB: [birth year], 63 y.o.   MRN: [id number]  Chief Complaint  Patient presents with   Urinary Frequency   Urinary Urgency   Urinary Incontinence   Patient is in today for the following acute concern: Urinary Frequency  Associated symptoms include frequency.    1) UTI concern: Ongoing for 3 weeks Frequency,urgency and urinary incontinence for about 3 weeks. She denies hematuria, dysuria, fever, flank pain, vaginal discharge, concern for STIs. She does not drink caffeine/alcohol.   Patient also states of left lower abdominal pain, for about a month. Constant in nature. No diarrhea, vomiting. She has a h/o diverticulosis. She drinks about 80 OZ of water a day.   H/O b/l nephrolithiasis, hospitalization from obstructive renal calculus in 07/31/20. Patient has not followed up with urology since her hospital discharge in 2022.   Immuno- H/O RA, Sjogren's. Patient also reports of vaginal dryness.    Review of Systems  Genitourinary:  Positive for frequency.   As per HPI    Objective:    BP 118/60   Pulse 79   Temp 98.5 F (36.9 C) (Oral)   Ht 5\' 7"  (1.702 m)   Wt 164 lb 12.8 oz (74.8 kg)   SpO2 96%   BMI 25.81 kg/m    Physical Exam Constitutional:      Appearance: Normal appearance.  HENT:     Head: Normocephalic.     Mouth/Throat:     Mouth: Mucous membranes are moist.  Cardiovascular:     Rate and Rhythm: Normal rate.  Pulmonary:     Effort: Pulmonary effort is normal.     Breath sounds: Normal breath sounds.  Abdominal:     General: Bowel sounds are normal.     Palpations: Abdomen is soft.     Tenderness: There is abdominal tenderness (on palpation over left lower quadrant). There is no right CVA tenderness, left CVA tenderness or guarding.  Neurological:     Mental Status: She is alert and oriented to person, place, and time.     Results for orders placed or performed in visit on  07/01/23  POCT Urinalysis Dipstick (Automated)  Result Value Ref Range   Color, UA Yellow    Clarity, UA Clear    Glucose, UA Negative Negative   Bilirubin, UA Negative    Ketones, UA Negative    Spec Grav, UA 1.010 1.010 - 1.025   Blood, UA Trace-Intact    pH, UA 7.0 5.0 - 8.0   Protein, UA Negative Negative   Urobilinogen, UA 0.2 0.2 or 1.0 E.U./dL   Nitrite, UA Negative    Leukocytes, UA Small (1+) (A) Negative       Assessment & Plan:  Patient is a pleasant 63 year old female with medical h/o b/l nephrolithiasis presenting for concerns with UTI.    Urinary urgency Assessment & Plan:  D/D includes UTI, genitourinary syndrome of menopause, nephrolithiasis, diverticulosis. We will obtain UA, urine culture.  If UA suggestive for UTI recommend treatment with Macrobid 100 mg, BID for 5 days when waiting for urine culture.  If hematuria and no suggestion for UTI consider treatment with Flomax 0.4 mg once a day, with urology referral.  Also recommend f/u with PCP in 2 weeks for pelvic exam and see if patient will benefit from vaginal estrogen cream for vaginal dryness.  Red flag symptoms discussed: significant abdominal pain, vomiting, unable to eat, fever, chills  and recommend emergent evaluation  Orders: -     POCT Urinalysis Dipstick (Automated) -     Urine Culture -     Urinalysis, Routine w reflex microscopic  Urinary incontinence, unspecified type Assessment & Plan: Plan per urinary urgency  Orders: -     POCT Urinalysis Dipstick (Automated) -     Urine Culture -     Urinalysis, Routine w reflex microscopic  Urinary frequency Assessment & Plan: Plan per urinary urgency  Orders: -     POCT Urinalysis Dipstick (Automated) -     Urine Culture -     Urinalysis, Routine w reflex microscopic    Return in about 2 weeks (around 07/15/2023) for With PCP for f/u on urinary sympotms .  Jacklin Mascot, MD

## 2023-07-02 ENCOUNTER — Other Ambulatory Visit: Payer: Self-pay

## 2023-07-02 DIAGNOSIS — N3090 Cystitis, unspecified without hematuria: Secondary | ICD-10-CM

## 2023-07-02 LAB — URINE CULTURE
MICRO NUMBER:: 16419324
Result:: NO GROWTH
SPECIMEN QUALITY:: ADEQUATE

## 2023-07-02 MED ORDER — NITROFURANTOIN MONOHYD MACRO 100 MG PO CAPS: 100.0000 mg | ORAL_CAPSULE | Freq: Two times a day (BID) | ORAL | 0 refills | Status: DC

## 2023-07-02 NOTE — Progress Notes (Signed)
 1. Cystitis (Primary) - nitrofurantoin, macrocrystal-monohydrate, (MACROBID) 100 MG capsule; Take 1 capsule (100 mg total) by mouth 2 (two) times daily.  Dispense: 10 capsule; Refill: 0   Jacklin Mascot, MD

## 2023-07-02 NOTE — Telephone Encounter (Signed)
 Notified pt we are still waiting on culture results

## 2023-07-02 NOTE — Telephone Encounter (Signed)
 Noted.  Jacklin Mascot, MD

## 2023-07-02 NOTE — Progress Notes (Signed)
 Talked with the patient and updated her on UA result. Patient continues to be symptomatic, recommend starting her on Macrobid 100 mg, twice a day for 5 days. Urine culture is still pending, if urine culture negative, recommend stopping antibiotic. Also discussed antibiotic s/e including gi upset, yeast infection and counseled she take OTC probiotic/full fat yogurt to reduce antibiotic s/e. Patient verbalized understanding.   Jacklin Mascot, MD

## 2023-07-03 ENCOUNTER — Other Ambulatory Visit: Payer: Self-pay

## 2023-07-03 NOTE — Progress Notes (Signed)
 Patient updated on result. No antibiotic recommended. She did not pick up Macrobid that was sent to the pharmacy on 07/02/23, so I canceled the prescription.   Jacklin Mascot, MD

## 2023-07-15 ENCOUNTER — Encounter: Payer: Self-pay | Admitting: Family

## 2023-07-15 ENCOUNTER — Ambulatory Visit (INDEPENDENT_AMBULATORY_CARE_PROVIDER_SITE_OTHER): Admitting: Family

## 2023-07-15 VITALS — BP 100/70 | HR 67 | Temp 97.9°F | Ht 67.0 in | Wt 163.6 lb

## 2023-07-15 DIAGNOSIS — Z780 Asymptomatic menopausal state: Secondary | ICD-10-CM

## 2023-07-15 DIAGNOSIS — M545 Low back pain, unspecified: Secondary | ICD-10-CM | POA: Diagnosis not present

## 2023-07-15 DIAGNOSIS — G8929 Other chronic pain: Secondary | ICD-10-CM | POA: Diagnosis not present

## 2023-07-15 DIAGNOSIS — K2960 Other gastritis without bleeding: Secondary | ICD-10-CM

## 2023-07-15 DIAGNOSIS — Z1231 Encounter for screening mammogram for malignant neoplasm of breast: Secondary | ICD-10-CM

## 2023-07-15 MED ORDER — OMEPRAZOLE 40 MG PO CPDR: 40.0000 mg | DELAYED_RELEASE_CAPSULE | Freq: Every day | ORAL | 3 refills | Status: AC

## 2023-07-15 MED ORDER — TRAMADOL HCL 50 MG PO TABS: 50.0000 mg | ORAL_TABLET | Freq: Every day | ORAL | 1 refills | Status: DC | PRN

## 2023-07-15 NOTE — Progress Notes (Signed)
 Assessment & Plan:  Chronic lumbosacral pain Assessment & Plan: Chronic. Discussed the importance of continued exercise. Continue  tylenol  650mg  two tablets BID. Start tramadol  50mg  every day prn. Discussed a controlled substance, sedating properties, and to store in a safe place.   Orders: -     traMADol  HCl; Take 1 tablet (50 mg total) by mouth daily as needed.  Dispense: 30 tablet; Refill: 1  Asymptomatic postmenopausal state -     DG Bone Density; Future  Encounter for screening mammogram for malignant neoplasm of breast -     3D Screening Mammogram, Left and Right; Future  Alkaline reflux gastritis -     Omeprazole ; Take 1 capsule (40 mg total) by mouth daily.  Dispense: 90 capsule; Refill: 3     Return precautions given.   Risks, benefits, and alternatives of the medications and treatment plan prescribed today were discussed, and patient expressed understanding.   Education regarding symptom management and diagnosis given to patient on AVS either electronically or printed.  Return in about 4 months (around 11/15/2023).  Bascom Bossier, FNP  Subjective:    Patient ID: Stacy [name], female    DOB: [birth year], 63 y.o.   MRN: [id number]  CC: Stacy [name] is a 63 y.o. female who presents today for follow up.   HPI: Feels well today No new complains She continues to have low back pain Compliant tylenol  650mg  two tablets BID and remains compliant with gabapentin .   She is walking and going to the gym . However limited, at time, by low back pain.  Denies numbness in legs, saddle anesthesia, leg weakness   No h/o seizure.   She has been on tramadol  in the past and tolerated well.     Urinary symptoms resolved.  07/01/2023 Urine culture bland  She continues to follow with Dr Tere Felts, last seen 05/13/2023  Allergies: Penicillins, Amoxicillin, Depakote  [divalproex  sodium], and Lamictal  [lamotrigine ] Current Outpatient Medications on File Prior to Visit   Medication Sig Dispense Refill   alendronate (FOSAMAX) 70 MG tablet      Ascorbic Acid  (VITAMIN C) 100 MG tablet Take 100 mg by mouth daily.     busPIRone  (BUSPAR ) 10 MG tablet Take 2 tablets (20 mg total) by mouth 2 (two) times daily. 360 tablet 3   cevimeline (EVOXAC) 30 MG capsule Take 30 mg by mouth daily.     Cholecalciferol  (VITAMIN D -3) 1000 units CAPS Take 1,000 Units by mouth daily.     clonazePAM  (KLONOPIN ) 0.5 MG tablet Take 0.5-1 tablets (0.25-0.5 mg total) by mouth daily as needed for anxiety. AS NEEDED FOR SEVERE ANXIETY ATTACKS,21 pills must last 30 days 21 tablet 3   gabapentin  (NEURONTIN ) 400 MG capsule Take 4 capsules (1,600 mg total) by mouth as directed. Take 1 capsule daily AM and 1 capsule daily at noon and 2 capsules daily at bedtime 120 capsule 3   hydroxychloroquine  (PLAQUENIL ) 200 MG tablet Take 200 mg by mouth 2 (two) times daily.     Ibuprofen 200 MG/10ML SUSP Take 800 mg by mouth every 8 (eight) hours as needed. 600 mL 0   leflunomide (ARAVA) 10 MG tablet Take 1 tablet by mouth daily.     levothyroxine  (SYNTHROID ) 200 MCG tablet Take 200 mcg by mouth daily.     Multiple Vitamins-Minerals (PRESERVISION AREDS) CAPS Take 1 capsule by mouth daily.     pilocarpine (SALAGEN) 5 MG tablet Take 1 tablet by mouth 2 (two) times daily.     polyethylene glycol (MIRALAX  /  GLYCOLAX ) 17 g packet Take 17 g by mouth daily. 14 each 0   rosuvastatin (CRESTOR) 10 MG tablet Take 10 mg by mouth daily.     SUMAtriptan  (IMITREX ) 100 MG tablet Take 100 mg by mouth every 2 (two) hours as needed for migraine.     venlafaxine  XR (EFFEXOR  XR) 150 MG 24 hr capsule Take 1 capsule (150 mg total) by mouth daily with breakfast. 90 capsule 3   vitamin E 45 MG (100 UNITS) capsule Take 100 Units by mouth daily.     No current facility-administered medications on file prior to visit.    Review of Systems  Constitutional:  Negative for chills and fever.  Respiratory:  Negative for cough.    Cardiovascular:  Negative for chest pain and palpitations.  Gastrointestinal:  Negative for nausea and vomiting.  Musculoskeletal:  Positive for back pain.  Neurological:  Negative for numbness.      Objective:    BP 100/70   Pulse 67   Temp 97.9 F (36.6 C) (Oral)   Ht 5\' 7"  (1.702 m)   Wt 163 lb 9.6 oz (74.2 kg)   SpO2 99%   BMI 25.62 kg/m  BP Readings from Last 3 Encounters:  07/15/23 100/70  07/01/23 118/60  04/18/23 104/80   Wt Readings from Last 3 Encounters:  07/15/23 163 lb 9.6 oz (74.2 kg)  07/01/23 164 lb 12.8 oz (74.8 kg)  04/18/23 158 lb 9.6 oz (71.9 kg)    Physical Exam Vitals reviewed.  Constitutional:      Appearance: She is well-developed.  Eyes:     Conjunctiva/sclera: Conjunctivae normal.  Cardiovascular:     Rate and Rhythm: Normal rate and regular rhythm.     Pulses: Normal pulses.     Heart sounds: Normal heart sounds.  Pulmonary:     Effort: Pulmonary effort is normal.     Breath sounds: Normal breath sounds. No wheezing, rhonchi or rales.  Skin:    General: Skin is warm and dry.  Neurological:     Mental Status: She is alert.  Psychiatric:        Speech: Speech normal.        Behavior: Behavior normal.        Thought Content: Thought content normal.

## 2023-07-15 NOTE — Patient Instructions (Addendum)
 Please schedule your mammogram and bone density at [facility] below.   Let me know if any issues in scheduling.   [facility] [address]  [place], [place] [address] Phone: [phone]  Continue scheduling tylenol  arthritis.  Start tramadol  50mg  daily as needed; monitor for sedation.

## 2023-07-15 NOTE — Assessment & Plan Note (Signed)
 Chronic. Discussed the importance of continued exercise. Continue  tylenol  650mg  two tablets BID. Start tramadol  50mg  every day prn. Discussed a controlled substance, sedating properties, and to store in a safe place.

## 2023-07-18 DIAGNOSIS — M0609 Rheumatoid arthritis without rheumatoid factor, multiple sites: Secondary | ICD-10-CM | POA: Insufficient documentation

## 2023-07-23 ENCOUNTER — Encounter: Payer: Self-pay | Admitting: Family

## 2023-07-23 ENCOUNTER — Ambulatory Visit (INDEPENDENT_AMBULATORY_CARE_PROVIDER_SITE_OTHER)

## 2023-07-23 ENCOUNTER — Ambulatory Visit (INDEPENDENT_AMBULATORY_CARE_PROVIDER_SITE_OTHER): Admitting: Family

## 2023-07-23 ENCOUNTER — Ambulatory Visit: Admitting: Psychiatry

## 2023-07-23 ENCOUNTER — Ambulatory Visit: Payer: Self-pay | Admitting: Family

## 2023-07-23 VITALS — BP 124/80 | HR 85 | Temp 98.0°F | Ht 67.0 in | Wt 162.2 lb

## 2023-07-23 DIAGNOSIS — M542 Cervicalgia: Secondary | ICD-10-CM

## 2023-07-23 MED ORDER — TIZANIDINE HCL 2 MG PO CAPS: 2.0000 mg | ORAL_CAPSULE | Freq: Every evening | ORAL | 1 refills | Status: DC | PRN
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
## 2023-07-25 NOTE — Patient Instructions (Addendum)
 Glad you are okay.   Tizanidine  for muscle relaxant  Do not drive or operate heavy machinery while on muscle relaxant. Please do not drink alcohol. Only take this medication as needed for acute muscle spasm at bedtime. This medication make you feel drowsy so be very careful.  Stop taking if become too drowsy or somnolent as this puts you at risk for falls. Please contact our office with any questions.   Do  not to take tizanidine  with other sedating medications such as tramadol , klonopin  to be safe.

## 2023-08-14 ENCOUNTER — Ambulatory Visit (INDEPENDENT_AMBULATORY_CARE_PROVIDER_SITE_OTHER): Admitting: Psychiatry

## 2023-08-14 ENCOUNTER — Encounter: Payer: Self-pay | Admitting: Psychiatry

## 2023-08-14 VITALS — BP 118/82 | HR 79 | Temp 98.4°F | Ht 67.0 in | Wt 166.6 lb

## 2023-08-14 DIAGNOSIS — F439 Reaction to severe stress, unspecified: Secondary | ICD-10-CM | POA: Insufficient documentation

## 2023-08-14 DIAGNOSIS — F411 Generalized anxiety disorder: Secondary | ICD-10-CM | POA: Diagnosis not present

## 2023-08-14 DIAGNOSIS — Z9189 Other specified personal risk factors, not elsewhere classified: Secondary | ICD-10-CM | POA: Insufficient documentation

## 2023-08-14 DIAGNOSIS — F5101 Primary insomnia: Secondary | ICD-10-CM

## 2023-08-14 DIAGNOSIS — F09 Unspecified mental disorder due to known physiological condition: Secondary | ICD-10-CM

## 2023-08-14 DIAGNOSIS — F3181 Bipolar II disorder: Secondary | ICD-10-CM

## 2023-08-14 MED ORDER — CARIPRAZINE HCL 1.5 MG PO CAPS: 1.5000 mg | ORAL_CAPSULE | Freq: Every day | ORAL | 1 refills | Status: DC

## 2023-08-14 NOTE — Patient Instructions (Signed)
 .Please call for EKG - [phone]   Cariprazine Capsules What is this medication? CARIPRAZINE (car i PRA zeen) treats schizophrenia and bipolar disorder. It may also be used with antidepressant medication to treat depression. It works by balancing the levels of dopamine and serotonin in your brain, substances that help regulate mood, behaviors, and thoughts. It belongs to a group of medications called antipsychotics. Antipsychotic medications can be used to treat several kinds of mental health conditions. This medicine may be used for other purposes; ask your health care provider or pharmacist if you have questions. COMMON BRAND NAME(S): VRAYLAR What should I tell my care team before I take this medication? They need to know if you have any of these conditions: Dementia Diabetes Have trouble controlling your muscles Heart disease High cholesterol History of breast cancer History of stroke Kidney disease Liver disease Low blood cell levels (white cells, red cells, and platelets) Low blood pressure Parkinson disease Seizures Suicidal thoughts, plans, or attempt by you or a family member Trouble swallowing An unusual or allergic reaction to cariprazine, other medications, foods, dyes, or preservatives Pregnant or trying to get pregnant Breastfeeding How should I use this medication? Take this medication by mouth with a glass of water. Follow the directions on the prescription label. You may take it with or without food. Take your medication at regular intervals. Do not take it more often than directed. Do not stop taking except on your care team's advice. A special MedGuide will be given to you by the pharmacist with each prescription and refill. Be sure to read this information carefully each time. Talk to your care team about the use of this medication in children. Special care may be needed. Overdosage: If you think you have taken too much of this medicine contact a poison control  center or emergency room at once. NOTE: This medicine is only for you. Do not share this medicine with others. What if I miss a dose? If you miss a dose, take it as soon as you can. If it is almost time for your next dose, take only that dose. Do not take double or extra doses. What may interact with this medication? Do not take this medication with any of the following: Metoclopramide  This medication may also interact with the following: Antihistamines for allergy, cough, and cold Carbamazepine Certain medications for anxiety or sleep Certain medications for depression, such as amitriptyline, fluoxetine, sertraline  Certain medications for fungal infections, such as itraconazole, ketoconazole Certain medications for Parkinson disease, such as levodopa General anesthetics, such as halothane, isoflurane, methoxyflurane, propofol  Medications for blood pressure Medications for seizures Medications that relax muscles for surgery Opioid medications for pain Phenothiazines, such as chlorpromazine, prochlorperazine, thioridazine Rifampin This list may not describe all possible interactions. Give your health care provider a list of all the medicines, herbs, non-prescription drugs, or dietary supplements you use. Also tell them if you smoke, drink alcohol, or use illegal drugs. Some items may interact with your medicine. What should I watch for while using this medication? Visit your care team for regular checks on your progress. Tell your care team if your symptoms do not start to get better or if they get worse. Do not suddenly stop taking this medication. You may develop a severe reaction. Your care team will tell you how much medication to take. If your care team wants you to stop the medication, the dose may be slowly lowered over time to avoid any side effects. This medication may cause thoughts  of suicide or depression. This includes sudden changes in mood, behaviors, or thoughts. These changes  can happen at any time but are more common in the beginning of treatment or after a change in dose. Call your care team right away if you experience these thoughts or worsening depression. This medication may affect your coordination, reaction time, or judgment. Do not drive or operate machinery until you know how this medication affects you. Sit up or stand slowly to reduce the risk of dizzy or fainting spells. Drinking alcohol with this medication can increase the risk of these side effects. This medication may cause dry eyes and blurred vision. If you wear contact lenses, you may feel some discomfort. Lubricating eye drops may help. See your care team if the problem does not go away or is severe. This medication may increase blood sugar. The risk may be higher in patients who already have diabetes. Ask your care team what you can do to lower your risk of diabetes while taking this medication. This medication can cause problems with controlling your body temperature. It can lower the response of your body to cold temperatures. If possible, stay indoors during cold weather. If you must go outdoors, wear warm clothes. It can also lower the response of your body to heat. Do not overheat. Do not over-exercise. Stay out of the sun when possible. If you must be in the sun, wear cool clothing. Drink plenty of water. If you have trouble controlling your body temperature, call your care team right away. Talk to your care team if you wish to become pregnant or think you may be pregnant. It is not known if this medication causes birth defects. A registry is available to monitor pregnancy outcomes in those taking this medication or similar medications. Talk to your care team or pharmacist for more information. What side effects may I notice from receiving this medication? Side effects that you should report to your care team as soon as possible: Allergic reactions--skin rash, itching, hives, swelling of the face,  lips, tongue, or throat High blood sugar (hyperglycemia)--increased thirst or amount of urine, unusual weakness or fatigue, blurry vision High fever, stiff muscles, increased sweating, fast or irregular heartbeat, and confusion, which may be signs of neuroleptic malignant syndrome Infection--fever, chills, cough, or sore throat Low blood pressure--dizziness, feeling faint or lightheaded, blurry vision Pain or trouble swallowing Seizures Stroke--sudden numbness or weakness of the face, arm, or leg, trouble speaking, confusion, trouble walking, loss of balance or coordination, dizziness, severe headache, change in vision Thoughts of suicide or self-harm, worsening mood, feelings of depression Uncontrolled and repetitive body movements, muscle stiffness or spasms, tremors or shaking, loss of balance or coordination, restlessness, shuffling walk, which may be signs of extrapyramidal symptoms (EPS) Side effects that usually do not require medical attention (report to your care team if they continue or are bothersome): Constipation Dizziness Drowsiness Nausea Trouble sleeping Upset stomach Vomiting This list may not describe all possible side effects. Call your doctor for medical advice about side effects. You may report side effects to FDA at 1-800-FDA-1088. Where should I keep my medication? Keep out of the reach of children. Store at room temperature between 15 and 30 degrees C (59 and 86 degrees F). Protect from light. Throw away any unused medication after the expiration date. NOTE: This sheet is a summary. It may not cover all possible information. If you have questions about this medicine, talk to your doctor, pharmacist, or health care provider.  2024 [employer]  Standard (2022-08-21 00:00:00)
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
## 2023-08-25 ENCOUNTER — Telehealth: Payer: Self-pay

## 2023-08-25 NOTE — Telephone Encounter (Signed)
 received fax requesting a 90 day supply per insurance for the gabapentin  400mg . pt was last seen on 6-19 next appt 8-20

## 2023-08-26 NOTE — Telephone Encounter (Signed)
 She has refills available on gabapentin  400 mg.  I do not currently recommend a 90-day supply, please stay on the 30-day for now.

## 2023-08-27 NOTE — Telephone Encounter (Signed)
 had to leave a message because the pharmacy phonoe lines were down. left message with dr. coby reply.

## 2023-09-10 ENCOUNTER — Ambulatory Visit: Payer: Self-pay | Admitting: *Deleted
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
## 2023-09-10 NOTE — Telephone Encounter (Signed)
 Spoke to pt she stated that she was feeling ok now, was on treadmill and felt funny almost passed out someone there took BP was low, she had her friend drive her home, pt stated she feels better now refused ED or UC , but I was able to schedule her to see Charan on 08/12/23

## 2023-09-11 ENCOUNTER — Encounter: Payer: Self-pay | Admitting: *Deleted

## 2023-09-11 ENCOUNTER — Ambulatory Visit: Admitting: Nurse Practitioner

## 2023-09-11 NOTE — Telephone Encounter (Signed)
 Called pt and left a vm to call the office back. Wanted to check on pt due to symptoms and no show office visit.

## 2023-09-12 NOTE — Telephone Encounter (Signed)
 Spoke to Patient she states she is just tired now. Patient denies any lightheadedness or any other symptoms at this time. Patient states she no showed her appointment due to her Cardiologist wants to see her on 09/12/23 at 4:20.

## 2023-09-12 NOTE — Telephone Encounter (Signed)
 noted

## 2023-09-12 NOTE — Telephone Encounter (Signed)
 Pt no showed for the appointment. Please call and check how is she doing.

## 2023-09-12 NOTE — Telephone Encounter (Signed)
 Noted Pt has been called,kelly  left vm

## 2023-09-16 ENCOUNTER — Encounter: Payer: Self-pay | Admitting: Nurse Practitioner

## 2023-10-09 ENCOUNTER — Other Ambulatory Visit: Payer: Self-pay | Admitting: Psychiatry

## 2023-10-09 DIAGNOSIS — F411 Generalized anxiety disorder: Secondary | ICD-10-CM

## 2023-10-09 DIAGNOSIS — F439 Reaction to severe stress, unspecified: Secondary | ICD-10-CM

## 2023-10-09 DIAGNOSIS — F3181 Bipolar II disorder: Secondary | ICD-10-CM

## 2023-10-15 ENCOUNTER — Encounter: Payer: Self-pay | Admitting: Psychiatry

## 2023-10-15 ENCOUNTER — Ambulatory Visit (INDEPENDENT_AMBULATORY_CARE_PROVIDER_SITE_OTHER): Admitting: Psychiatry

## 2023-10-15 VITALS — BP 118/68 | HR 95 | Temp 98.3°F | Ht 67.0 in | Wt 169.6 lb

## 2023-10-15 DIAGNOSIS — F3181 Bipolar II disorder: Secondary | ICD-10-CM

## 2023-10-15 DIAGNOSIS — F5101 Primary insomnia: Secondary | ICD-10-CM

## 2023-10-15 DIAGNOSIS — F09 Unspecified mental disorder due to known physiological condition: Secondary | ICD-10-CM

## 2023-10-15 DIAGNOSIS — F411 Generalized anxiety disorder: Secondary | ICD-10-CM | POA: Diagnosis not present

## 2023-10-15 DIAGNOSIS — F439 Reaction to severe stress, unspecified: Secondary | ICD-10-CM

## 2023-10-15 MED ORDER — METHYLPHENIDATE HCL 5 MG PO TABS: 5.0000 mg | ORAL_TABLET | Freq: Every morning | ORAL | 0 refills | Status: DC

## 2023-10-15 MED ORDER — BUSPIRONE HCL 10 MG PO TABS
10.0000 mg | ORAL_TABLET | Freq: Two times a day (BID) | ORAL | Status: DC
Start: 2023-10-15 — End: 2023-12-10
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
## 2023-10-15 NOTE — Patient Instructions (Signed)
 Methylphenidate Tablets What is this medication? METHYLPHENIDATE (meth il FEN i date) treats attention-deficit hyperactivity disorder (ADHD). It works by improving focus and reducing impulsive behavior. It may also be used to treat narcolepsy. It works by promoting wakefulness. It belongs to a group of medications called stimulants. This medicine may be used for other purposes; ask your health care provider or pharmacist if you have questions. COMMON BRAND NAME(S): Methylin, Ritalin What should I tell my care team before I take this medication? They need to know if you have any of these conditions: Anxiety or panic attacks Circulation problems in fingers and toes Glaucoma Heart disease or a heart defect High blood pressure History of substance use disorder Liver disease Mental health condition Motor tics, family history or diagnosis of Tourette's syndrome Seizures Stroke Suicidal thoughts, plans, or attempt by you or a family member Thyroid  disease An unusual or allergic reaction to methylphenidate, other medications, foods, dyes, or preservatives Pregnant or trying to get pregnant Breast-feeding How should I use this medication? Take this medication by mouth with water. Take it as directed on the prescription label at the same time every day. It is best to take this medication 30 to 45 minutes before meals, unless your care team tells you otherwise. Usually the last dose of the day will be taken at least 4 to 6 hours before bedtime, so it will not interfere with sleep. Do not take your medication more often than directed. A special MedGuide will be given to you by the pharmacist with each prescription and refill. Be sure to read this information carefully each time. Talk to your care team about the use of this medication in children. While it may be prescribed for children as young as 6 years for selected conditions, precautions do apply. Overdosage: If you think you have taken too much  of this medicine contact a poison control center or emergency room at once. NOTE: This medicine is only for you. Do not share this medicine with others. What if I miss a dose? If you miss a dose, take it as soon as you can. If it is almost time for your next dose, take only that dose. Do not take double or extra doses. What may interact with this medication? Do not take this medication with any of the following: MAOIs, such as Marplan, Nardil, and Parnate Ozanimod This medication may also interact with the following: Certain medications for blood pressure, heart disease, irregular heartbeat Certain medications for depression, anxiety, or other mental health conditions Certain medications that cause drowsiness before a procedure, such as isoflurane Linezolid Methylene blue Opioids Risperidone St. John's wort This list may not describe all possible interactions. Give your health care provider a list of all the medicines, herbs, non-prescription drugs, or dietary supplements you use. Also tell them if you smoke, drink alcohol, or use illegal drugs. Some items may interact with your medicine. What should I watch for while using this medication? Visit your care team for regular checks on your progress. Tell your care team if your symptoms do not start to get better or if they get worse. This medication requires a new prescription from your care team every time it is filled at the pharmacy. This medication can be abused and cause your brain and body to depend on it after high doses or long term use. Your care team will assess your risk and monitor you closely during treatment. Long term use of this medication may cause your brain and body to  depend on it. You may be able to take breaks from this medication during weekends, holidays, or summer vacations. Talk to your care team about what works for you. If your care team wants you to stop this medication permanently, the dose may be slowly lowered over  time to reduce the risk of side effects. Tell your care team if this medication loses its effects, or if you feel you need to take more than the prescribed amount. Do not change your dose without talking to your care team. Do not take this medication close to bedtime. It may prevent you from sleeping. Loss of appetite is common when starting this medication. Eating small, frequent meals or snacks can help. Talk to your care team if appetite loss persists. Children should have height and weight checked often while taking this medication. Tell your care team right away if you notice unexplained wounds on your fingers and toes while taking this medication. You should also tell your care team if you experience numbness or pain, changes in the skin color, or sensitivity to temperature in your fingers or toes. Contact your care team right away if you have an erection that lasts longer than 4 hours or if it becomes painful. This may be a sign of a serious problem and must be treated right away to prevent permanent damage. If you are going to need surgery, a MRI, CT, or other procedure, tell your care team that you are using this medication. You may need to stop taking this medication before the procedure. What side effects may I notice from receiving this medication? Side effects that you should report to your care team as soon as possible: Allergic reactions--skin rash, itching, hives, swelling of the face, lips, tongue, or throat Heart attack--pain or tightness in the chest, shoulders, arms, or jaw, nausea, shortness of breath, cold or clammy skin, feeling faint or lightheaded Heart rhythm changes--fast or irregular heartbeat, dizziness, feeling faint or lightheaded, chest pain, trouble breathing Increase in blood pressure Irritability, confusion, fast or irregular heartbeat, muscle stiffness, twitching muscles, sweating, high fever, seizure, chills, vomiting, diarrhea, which may be signs of serotonin  syndrome Mood and behavior changes--anxiety, nervousness, confusion, hallucinations, irritability, hostility, thoughts of suicide or self-harm, worsening mood, feelings of depression Prolonged or painful erection Raynaud syndrome--cool, numb, or painful fingers or toes that may change color from pale, to blue, to red Seizures Stroke--sudden numbness or weakness of the face, arm, or leg, trouble speaking, confusion, trouble walking, loss of balance or coordination, dizziness, severe headache, change in vision Sudden eye pain or change in vision such as blurry vision, seeing halos around lights, vision loss Side effects that usually do not require medical attention (report these to your care team if they continue or are bothersome): Dry mouth Headache Loss of appetite with weight loss Nausea Stomach pain Trouble sleeping This list may not describe all possible side effects. Call your doctor for medical advice about side effects. You may report side effects to FDA at 1-800-FDA-1088. Where should I keep my medication? Keep out of the reach of children and pets. This medication can be abused. Keep it in a safe place to protect it from theft. Do not share it with anyone. It is only for you. Selling or giving away this medication is dangerous and against the law. Store at room temperature between 15 and 30 degrees C (59 and 86 degrees F). Protect from light and moisture. Keep container tightly closed. Get rid of any unused medication after  the expiration date. This medication may cause harm and death if it is taken by other adults, children, or pets. It is important to get rid of the medication as soon as you no longer need it or it is expired. You can do this in two ways: Take the medication to a medication take-back program. Check with your pharmacy or law enforcement to find a location. If you cannot return the medication, check the label or package insert to see if the medication should be thrown  out in the garbage or flushed down the toilet. If you are not sure, ask your care team. If it is safe to put it in the trash, take the medication out of the container. Mix the medication with cat litter, dirt, coffee grounds, or other unwanted substance. Seal the mixture in a bag or container. Put it in the trash. NOTE: This sheet is a summary. It may not cover all possible information. If you have questions about this medicine, talk to your doctor, pharmacist, or health care provider.  2024 Elsevier/Gold Standard (2023-01-24 00:00:00)

## 2023-10-31 ENCOUNTER — Telehealth: Admitting: Psychiatry

## 2023-11-06 ENCOUNTER — Ambulatory Visit: Admitting: Psychiatry

## 2023-11-17 ENCOUNTER — Ambulatory Visit (INDEPENDENT_AMBULATORY_CARE_PROVIDER_SITE_OTHER): Admitting: Family

## 2023-11-17 ENCOUNTER — Telehealth: Payer: Self-pay | Admitting: Family

## 2023-11-17 ENCOUNTER — Encounter: Payer: Self-pay | Admitting: Family

## 2023-11-17 VITALS — BP 136/76 | HR 76 | Temp 98.2°F | Ht 66.0 in | Wt 164.8 lb

## 2023-11-17 DIAGNOSIS — Z1211 Encounter for screening for malignant neoplasm of colon: Secondary | ICD-10-CM

## 2023-11-17 DIAGNOSIS — E039 Hypothyroidism, unspecified: Secondary | ICD-10-CM

## 2023-11-17 DIAGNOSIS — K5900 Constipation, unspecified: Secondary | ICD-10-CM | POA: Diagnosis not present

## 2023-11-17 DIAGNOSIS — E785 Hyperlipidemia, unspecified: Secondary | ICD-10-CM | POA: Diagnosis not present

## 2023-11-17 DIAGNOSIS — Z23 Encounter for immunization: Secondary | ICD-10-CM | POA: Diagnosis not present

## 2023-11-17 MED ORDER — ROSUVASTATIN CALCIUM 10 MG PO TABS: 10.0000 mg | ORAL_TABLET | Freq: Every day | ORAL | 3 refills | Status: AC

## 2023-11-17 NOTE — Telephone Encounter (Signed)
 Spoke to pt she stated that she does not think she still has report she is agreeable for you to  go ahead and send in the referral to GI

## 2023-11-17 NOTE — Telephone Encounter (Signed)
 [facility]...[phone], pt is not in their system

## 2023-11-17 NOTE — Assessment & Plan Note (Signed)
 Chronic, stable. Refilled crestor  10mg  every day.

## 2023-11-17 NOTE — Telephone Encounter (Signed)
 Call [facility] [phone] Pt thinks she had colonoscopy 5 years ago; she isnt sure if up to date  We need report and to know WHEN she returns

## 2023-11-17 NOTE — Telephone Encounter (Signed)
 Poke to pt she stated that she does not go there anymore but she had Colonoscopy  there

## 2023-11-17 NOTE — Assessment & Plan Note (Addendum)
 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 Discussed use of osmotic laxative preferred over stimulant laxative. Stop ducolax. Start and titrate miralax . Discussed fiber, water, walking. She politely declines Ab Xr to exclude obstruction which I think is reasonable based on exam. She will let me know how she is doing. Requesting colonoscopy records as well; concerned she is overdue.

## 2023-11-17 NOTE — Progress Notes (Signed)
 Assessment & Plan:  Screening for colon cancer -     Ambulatory referral to Gastroenterology  Need for influenza vaccination -     Flu vaccine trivalent PF, 6mos and older(Flulaval,Afluria,Fluarix,Fluzone)  Hyperlipidemia, unspecified hyperlipidemia type Assessment & Plan: Chronic, stable. Refilled crestor  10mg  every day.  Orders: -     Rosuvastatin  Calcium ; Take 1 tablet (10 mg total) by mouth daily.  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 Discussed use of osmotic laxative preferred over stimulant laxative. Stop ducolax. Start and titrate miralax . Discussed fiber, water, walking. She politely declines Ab Xr to exclude obstruction which I think is reasonable based on exam. She will let me know how she is doing. Requesting colonoscopy records as well; concerned she is overdue.       Return precautions given.   Risks, benefits, and alternatives of the medications and treatment plan prescribed today were discussed, and patient expressed understanding.   Education regarding symptom management and diagnosis given to patient on AVS either electronically or printed.  Return in about 6 weeks (around 12/29/2023).  Rollene Northern, FNP  Subjective:    Patient ID: Stacy [name], female    DOB: April 18, 1960, 62 y.o.   MRN: [id number]  CC: Stacy [name] is a 63 y.o. female who presents today for follow up.   HPI: HPI Discussed the use of AI scribe software for clinical note transcription with the patient, who gave verbal consent to proceed.  History of Present Illness   Stacy [name] is a 63 year old female with hypothyroidism who presents with chronic constipation.  She has been experiencing chronic constipation, worsening over the past four months, requiring the use of laxatives for bowel movements. Last BM 3 days  ago. She uses Dulcolax, taking two pills a couple of times a week, which allows her to have bowel movements for two days, followed by bloating and the need to take the laxative again. Her husband assisted her with an enema during a severe episode.  She has a history of hypothyroidism and is currently taking Synthroid  200 mcg Monday through Saturday and a half dose on Sundays. Her last TSH check was three months ago, showing a low level of 0.047.  She experiences cramping pain in the lower abdomen, which improves after bowel movements. No blood in stool or pencil-thin stools. She passes gas and notes a slight weight increase. She is no longer going to the gym.  Her last colonoscopy was approximately four years ago at [facility], where a polyp was found.   She has stopped taking Crestor  because she didn't see it in her medical box. She drinks plenty of fluids throughout the day. She is eating normally. No fever, diarrhea, vomiting.      She is compliant with tramadol  50 mg as needed for back pain.    Following with Dr. Coby 10/15/2023, follow-up bipolar 2. Allergies: Penicillins, Amoxicillin, Depakote  [divalproex  sodium], and Lamictal  [lamotrigine ] Current Outpatient Medications on File Prior to Visit  Medication Sig Dispense Refill   alendronate (FOSAMAX) 70 MG tablet      Ascorbic Acid  (VITAMIN C) 100 MG tablet Take 100 mg by mouth daily.     busPIRone  (BUSPAR ) 10 MG tablet Take 1 tablet (10 mg total) by mouth 2 (two) times daily.     cevimeline (EVOXAC) 30 MG capsule Take 30 mg by mouth daily.  Cholecalciferol  (VITAMIN D -3) 1000 units CAPS Take 1,000 Units by mouth daily.     clonazePAM  (KLONOPIN ) 0.5 MG tablet Take 0.5-1 tablets (0.25-0.5 mg total) by mouth daily as needed for anxiety. AS NEEDED FOR SEVERE ANXIETY ATTACKS,21 pills must last 30 days 21 tablet 3   gabapentin  (NEURONTIN ) 400 MG capsule Take 4 capsules (1,600 mg total) by mouth as directed. Take 1 capsule daily AM and 1  capsule daily at noon and 2 capsules daily at bedtime 120 capsule 3   hydroxychloroquine  (PLAQUENIL ) 200 MG tablet Take 200 mg by mouth 2 (two) times daily.     Ibuprofen  200 MG/10ML SUSP Take 800 mg by mouth every 8 (eight) hours as needed. 600 mL 0   leflunomide (ARAVA) 10 MG tablet Take 1 tablet by mouth daily.     levothyroxine  (SYNTHROID ) 200 MCG tablet Take 200 mcg by mouth daily. Takes synthroid  M-Sat; half dose on Sundays     Multiple Vitamins-Minerals (PRESERVISION AREDS) CAPS Take 1 capsule by mouth daily.     omeprazole  (PRILOSEC) 40 MG capsule Take 1 capsule (40 mg total) by mouth daily. 90 capsule 3   pilocarpine (SALAGEN) 5 MG tablet Take 1 tablet by mouth 2 (two) times daily.     polyethylene glycol (MIRALAX  / GLYCOLAX ) 17 g packet Take 17 g by mouth daily. 14 each 0   SUMAtriptan  (IMITREX ) 100 MG tablet Take 100 mg by mouth every 2 (two) hours as needed for migraine.     tizanidine  (ZANAFLEX ) 2 MG capsule Take 1 capsule (2 mg total) by mouth at bedtime as needed for muscle spasms. 15 capsule 1   traMADol  (ULTRAM ) 50 MG tablet Take 1 tablet (50 mg total) by mouth daily as needed. 30 tablet 1   venlafaxine  XR (EFFEXOR  XR) 150 MG 24 hr capsule Take 1 capsule (150 mg total) by mouth daily with breakfast. 90 capsule 3   vitamin E 45 MG (100 UNITS) capsule Take 100 Units by mouth daily.     VRAYLAR  1.5 MG capsule TAKE 1 CAPSULE(1.5 MG) BY MOUTH DAILY 30 capsule 1   No current facility-administered medications on file prior to visit.    Review of Systems  Constitutional:  Negative for chills and fever.  Respiratory:  Negative for cough.   Cardiovascular:  Negative for chest pain and palpitations.  Gastrointestinal:  Positive for abdominal pain (diffuse, lower) and constipation. Negative for blood in stool, diarrhea, nausea and vomiting.      Objective:    BP 136/76   Pulse 76   Temp 98.2 F (36.8 C) (Oral)   Ht 5' 6 (1.676 m)   Wt 164 lb 12.8 oz (74.8 kg)   SpO2 97%    BMI 26.60 kg/m  BP Readings from Last 3 Encounters:  11/17/23 136/76  07/23/23 124/80  07/15/23 100/70   Wt Readings from Last 3 Encounters:  11/17/23 164 lb 12.8 oz (74.8 kg)  07/23/23 162 lb 3.2 oz (73.6 kg)  07/15/23 163 lb 9.6 oz (74.2 kg)   Lab Results  Component Value Date   LDLCALC 55 03/21/2023    Physical Exam Vitals reviewed.  Constitutional:      Appearance: Normal appearance. She is well-developed.  Eyes:     Conjunctiva/sclera: Conjunctivae normal.  Cardiovascular:     Rate and Rhythm: Normal rate and regular rhythm.     Pulses: Normal pulses.     Heart sounds: Normal heart sounds.  Pulmonary:     Effort: Pulmonary effort is normal.  Breath sounds: Normal breath sounds. No wheezing, rhonchi or rales.  Abdominal:     General: Bowel sounds are normal. There is no distension.     Palpations: Abdomen is soft. Abdomen is not rigid. There is no fluid wave or mass.     Tenderness: There is no abdominal tenderness. There is no guarding or rebound.     Comments: Abdomen is soft Mild discomfort deep palpation diffuse lower abdomen.  Not exquisite.  No guarding, rebound.  Skin:    General: Skin is warm and dry.  Neurological:     Mental Status: She is alert.  Psychiatric:        Speech: Speech normal.        Behavior: Behavior normal.        Thought Content: Thought content normal.

## 2023-11-17 NOTE — Patient Instructions (Addendum)
 Please let me know if constipation is not quickly relieved in the next 1 day.     Constipation plan  1) start taking MiraLAX , half a dose every day or every other day and change the dose as needed after 3 to 5 days with goal of 1-2 soft bowel movements every day or every other day. MiraLAX  is an osmotic laxative. That means it draws water into the colon, which softens the stool and may naturally stimulate the colon to contract. These actions help ease bowel movements. For example, you  may find that using the medication every other day or three times a week is a good bowel regimen for you. Or perhaps, twice weekly.  2) Take Colace ( stool softener) twice daily every day.   It is MOST important to drink LOTS of water and follow a HIGH fiber diet to keep foods moving through the gut. You may add Metamucil to a beverage that you drink.  Information on prevention of constipation as well as acute treatment for constipation as included below.  If there is no improvement in your symptoms, or if there is any worsening of symptoms, or if you have any additional concerns, please return to this clinic for re-evaluation; or, if we are closed, consider going to the Emergency Room for evaluation.    Constipation Prevention What is Constipation? Constipation is hard, dry bowel movements or the inability to have a bowel movement.  You can also feel like you need to have a bowel movement but not be able to.  It can also be painful when you strain to have a bowel movement.  Taking narcotic pain medicine after surgery can make you constipated, even if you have never had a problem with constipation. What Do I Need To Do? The best thing to do for constipation is to keep it from happening.  This can be done by: Adding laxatives to your daily routine, when taking prescription pain medicines after surgery. Add 17 gm Miralax  daily or 100 mg Colace once or twice daily. (Miralax  is mixed in water. Colace is a pill). They  soften your bowel movements to make them easier to pass and hurt less. Drink plenty of water to help flush your bowels.  (Eight, 8 ounce glasses daily) Eat foods high in fiber such as whole grains, vegetables, cereals, fruits, and prune juice (5-7 servings a day or 25 grams).  If you do not know how much fiber a food has in it you can look on the label under "dietary fiber."  If you have trouble getting enough fiber in your diet you may want to consider a fiber supplement such as Metamucil or Citrucel.  Also, be aware that eating fiber without drinking enough water can make constipation worse. If you do become constipated some medications that may help are: Bisacodyl (Dulcolax) is available in tablet form or a suppository. Glycerin suppositories are also a good choice if you need a fast acting medication. Everybody is different and may have different results.  Talk to your pharmacist or health care provider about your specific problems. They can help you choose the best product for you.  Why Is It Important for Me To Do This? Being constipated is not something you have to live with.  There are many things you can do to help.   Feeling bad can interfere with your recovery after surgery.  If constipation goes on for too long it can become a very serious medical problem. You may need to  visit your doctor or go to the hospital.  That is why it is very important to drink lots of water, eat enough fiber, and keep it from happening.  Ask Questions We want to answer all of your questions and concerns.  That's why we encourage you to use a program called Ask Me 3T, created by the [employer].  By using Ask Me 3T you are encouraged to ask 3 simple (yet, potentially life saving questions) whenever you are talking with your physician, nurse or pharmacist: What is my main problem? What do I need to do? Why is it important for me to do this? By understanding the answer to these three  questions and any other questions you may have, you have the knowledge necessary to manage your health. Please feel very comfortable asking any questions. Healthcare is complicated, so if you hear an answer you do not understand, please ask your health care team to explain again.   Sources: [employer] 12-14-09 N

## 2023-11-18 LAB — TSH: TSH: 6.2 u[IU]/mL — ABNORMAL HIGH (ref 0.450–4.500)

## 2023-11-18 NOTE — Telephone Encounter (Signed)
 Colonoscopy order has been placed.

## 2023-11-20 ENCOUNTER — Ambulatory Visit: Payer: Self-pay | Admitting: Family

## 2023-12-02 ENCOUNTER — Other Ambulatory Visit: Payer: Self-pay | Admitting: Psychiatry

## 2023-12-02 DIAGNOSIS — F411 Generalized anxiety disorder: Secondary | ICD-10-CM

## 2023-12-10 ENCOUNTER — Encounter: Payer: Self-pay | Admitting: Psychiatry

## 2023-12-10 ENCOUNTER — Ambulatory Visit (INDEPENDENT_AMBULATORY_CARE_PROVIDER_SITE_OTHER): Admitting: Psychiatry

## 2023-12-10 ENCOUNTER — Other Ambulatory Visit: Payer: Self-pay

## 2023-12-10 VITALS — BP 106/74 | HR 84 | Ht 66.0 in | Wt 167.2 lb

## 2023-12-10 DIAGNOSIS — Z9189 Other specified personal risk factors, not elsewhere classified: Secondary | ICD-10-CM

## 2023-12-10 DIAGNOSIS — F3181 Bipolar II disorder: Secondary | ICD-10-CM | POA: Diagnosis not present

## 2023-12-10 DIAGNOSIS — F439 Reaction to severe stress, unspecified: Secondary | ICD-10-CM

## 2023-12-10 DIAGNOSIS — F5101 Primary insomnia: Secondary | ICD-10-CM | POA: Diagnosis not present

## 2023-12-10 DIAGNOSIS — F411 Generalized anxiety disorder: Secondary | ICD-10-CM

## 2023-12-10 DIAGNOSIS — F09 Unspecified mental disorder due to known physiological condition: Secondary | ICD-10-CM

## 2023-12-10 MED ORDER — GABAPENTIN 400 MG PO CAPS: 1600.0000 mg | ORAL_CAPSULE | ORAL | 3 refills | Status: DC

## 2023-12-10 MED ORDER — BUSPIRONE HCL 10 MG PO TABS
20.0000 mg | ORAL_TABLET | Freq: Two times a day (BID) | ORAL | 1 refills | Status: AC
Start: 2023-12-10 — End: ?
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
## 2023-12-10 NOTE — Patient Instructions (Signed)
 Please call for [phone]

## 2023-12-26 ENCOUNTER — Telehealth (INDEPENDENT_AMBULATORY_CARE_PROVIDER_SITE_OTHER): Admitting: Psychiatry

## 2023-12-26 ENCOUNTER — Encounter: Payer: Self-pay | Admitting: Psychiatry

## 2023-12-26 DIAGNOSIS — F5101 Primary insomnia: Secondary | ICD-10-CM

## 2023-12-26 DIAGNOSIS — F3181 Bipolar II disorder: Secondary | ICD-10-CM | POA: Diagnosis not present

## 2023-12-26 DIAGNOSIS — F09 Unspecified mental disorder due to known physiological condition: Secondary | ICD-10-CM

## 2023-12-26 DIAGNOSIS — F439 Reaction to severe stress, unspecified: Secondary | ICD-10-CM

## 2023-12-26 DIAGNOSIS — F411 Generalized anxiety disorder: Secondary | ICD-10-CM | POA: Diagnosis not present

## 2023-12-26 MED ORDER — CLONAZEPAM 0.5 MG PO TABS: 0.2500 mg | ORAL_TABLET | Freq: Every day | ORAL | 2 refills | Status: AC | PRN

## 2023-12-26 NOTE — Progress Notes (Signed)
 Virtual Visit via Video Note  I connected with Stacy [name] on 12/26/23 at  9:00 AM EDT by a video enabled telemedicine application and verified that I am speaking with the correct person using two identifiers.  Location Provider Location : [facility] Patient Location : Car  Participants: Patient , Provider   I discussed the limitations of evaluation and management by telemedicine and the availability of in person appointments. The patient expressed understanding and agreed to proceed.   I discussed the assessment and treatment plan with the patient. The patient was provided an opportunity to ask questions and all were answered. The patient agreed with the plan and demonstrated an understanding of the instructions.   The patient was advised to call back or seek an in-person evaluation if the symptoms worsen or if the condition fails to improve as anticipated.   BH MD OP Progress Note  12/26/2023 9:25 AM Shavanna Furnari  MRN:  [id number]  Chief Complaint:  Chief Complaint  Patient presents with   Anxiety   Depression   Medication Refill   Follow-up   Discussed the use of AI scribe software for clinical note transcription with the patient, who gave verbal consent to proceed.  History of Present Illness Stacy [name] is a 63 year old Caucasian female, currently unemployed, lives in [place], has a history of bipolar disorder, GAD, mild cognitive disorder, primary insomnia, neuroleptic induced Parkinson's disease, Addison's disease, Sjogren syndrome, hypertension, hypothyroidism, arthritis, migraine headaches, seizure-like spells (ruled out to seizures) was evaluated by telemedicine today.  She reports significant improvement in her anxiety and sleep since her last visit. She reports that increasing her Buspar  to 20 mg twice daily has been effective, and she currently takes gabapentin  400 mg in the morning, 400 mg at noon, and 800 mg at bedtime. She continues Vraylar  as previously  prescribed. She describes her anxiety as much better and less severe than before, and she now sleeps approximately 8 hours per night, compared to only 1.5 hours previously. She notes some residual sadness but denies current crying spells.  Over the past couple of nights, she describes experiencing nightmares and visual disturbances, specifically seeing things or perceiving someone standing in her bedroom when she is awake but drowsy. These experiences are brief, lasting a few minutes, and she manages them by using deep breathing and self-talk, after which she is able to return to sleep. She recalls having similar experiences in the past, which had resolved but have recently recurred.  She denies thoughts of hurting herself or others.  She recently saw a rheumatologist for joint swelling and pain.  She is currently undergoing workup.  Visit Diagnosis:    ICD-10-CM   1. Bipolar 2 disorder, major depressive episode (HCC)  F31.81    Moderate-improving    2. GAD (generalized anxiety disorder)  F41.1 clonazePAM  (KLONOPIN ) 0.5 MG tablet    3. Trauma and stressor-related disorder  F43.9    Unspecified, rule out PTSD    4. Primary insomnia  F51.01     5. Mild cognitive disorder  F09       Past Psychiatric History: I have reviewed past psychiatric history from progress note on 12/10/2017.  Past trials of Paxil , Wellbutrin , risperidone, Latuda , Prolixin , Seroquel , Abilify , Zyprexa , Invega , Lamictal , Topamax, Lexapro , Belsomra , Depakote , Rexulti -unable to afford it.  Neuropsychological testing per Dr. Rodenbough-07/10/2021-not consistent with conditions like Alzheimer's, Lewy body dementia or other subcortical dementia.  Cognitive issues secondary to psychiatric condition.  Past Medical History:  Past Medical History:  Diagnosis Date  Aneurysm    Anxiety    Arthritis    Brain stem lesion    Chronic kidney disease    kideny stone   Depression    Diverticulitis    Family history of adverse  reaction to anesthesia    mother has N/V   Fibromyalgia    GERD (gastroesophageal reflux disease)    Headache    Hypotension    Hypothyroid    Memory loss    Migraine    Raynaud disease    Renal stone    Sjogren's syndrome    Thyroid  disease     Past Surgical History:  Procedure Laterality Date   ABDOMINAL HYSTERECTOMY     2014 for noncancerous reasons d/t endometriosis; no cervix, ovaries.   BASAL CELL CARCINOMA EXCISION     BREAST BIOPSY Left    CESAREAN SECTION     1982, 1984   CYSTOSCOPY WITH STENT PLACEMENT Bilateral 08/01/2020   Procedure: CYSTOSCOPY WITH STENT PLACEMENT;  Surgeon: Penne Knee, MD;  Location: [facility];  Service: Urology;  Laterality: Bilateral;   CYSTOSCOPY/URETEROSCOPY/HOLMIUM LASER/STENT PLACEMENT Bilateral 08/14/2020   Procedure: CYSTOSCOPY/URETEROSCOPY/HOLMIUM LASER/STENT PLACEMENT;  Surgeon: Penne Knee, MD;  Location: [facility];  Service: Urology;  Laterality: Bilateral;   EYE SURGERY Right    cataract   KNEE SURGERY Left    torn meniscus   LITHOTRIPSY     MUSCLE BIOPSY     OOPHORECTOMY      Family Psychiatric History: I have reviewed family psychiatric history from progress note on 12/10/2017.  Family History:  Family History  Problem Relation Age of Onset   Migraines Mother    Anxiety disorder Mother    Depression Mother    Cancer Father    Hypertension Father    Stroke Father    Heart attack Father    Alcohol abuse Father    Diabetes Brother    Alcohol abuse Sister    Drug abuse Sister    Anxiety disorder Sister    Depression Sister    Drug abuse Brother    Cancer Paternal Aunt    Lupus Paternal Aunt    Thyroid  disease Paternal Aunt    Diabetes Maternal Grandfather    Anxiety disorder Maternal Grandmother    Depression Maternal Grandmother     Social History: I have reviewed social history from progress note on 12/10/2017. Social History   Socioeconomic History   Marital status: Married    Spouse name: Debby    Number of children: 2   Years of education: 14   Highest education level: Associate degree: occupational, scientist, product/process development, or vocational program  Occupational History   Occupation: Disabled  Tobacco Use   Smoking status: Never   Smokeless tobacco: Never  Vaping Use   Vaping status: Never Used  Substance and Sexual Activity   Alcohol use: No    Alcohol/week: 0.0 standard drinks of alcohol   Drug use: Never   Sexual activity: Yes    Birth control/protection: Surgical  Other Topics Concern   Not on file  Social History Narrative   Lives at home with husband. Cathy).    Disabled since 2005      Education college.   Right handed.   2 cups caffeine/daily    2 children         She grew up  in [place]       Spent large part life in [place]       Moved her for husbands' work at [employer]  Enjoys reading historical fiction, going to the gym.    Social Drivers of Corporate Investment Banker Strain: Low Risk  (07/14/2023)   Overall Financial Resource Strain (CARDIA)    Difficulty of Paying Living Expenses: Not hard at all  Food Insecurity: No Food Insecurity (07/14/2023)   Hunger Vital Sign    Worried About Running Out of Food in the Last Year: Never true    Ran Out of Food in the Last Year: Never true  Transportation Needs: No Transportation Needs (07/14/2023)   PRAPARE - Administrator, Civil Service (Medical): No    Lack of Transportation (Non-Medical): No  Physical Activity: Sufficiently Active (07/14/2023)   Exercise Vital Sign    Days of Exercise per Week: 4 days    Minutes of Exercise per Session: 40 min  Stress: Stress Concern Present (07/14/2023)   [employer] of Occupational Health - Occupational Stress Questionnaire    Feeling of Stress : To some extent  Social Connections: Moderately Integrated (07/14/2023)   Social Connection and Isolation Panel    Frequency of Communication with Friends and Family: More than three times a week    Frequency of Social  Gatherings with Friends and Family: Once a week    Attends Religious Services: More than 4 times per year    Active Member of [employer] or Organizations: No    Attends Engineer, Structural: Not on file    Marital Status: Married    Allergies:  Allergies  Allergen Reactions   Penicillins Hives   Amoxicillin Hives   Depakote  [Divalproex  Sodium]     vomiting   Lamictal  [Lamotrigine ]     Side effects of abdominal cramps, nausea , diarrhea ,nightmares    Metabolic Disorder Labs: Lab Results  Component Value Date   HGBA1C 5.0 11/13/2015   No results found for: PROLACTIN Lab Results  Component Value Date   CHOL 143 03/21/2023   TRIG 49 03/21/2023   HDL 77 03/21/2023   CHOLHDL 1.9 03/21/2023   LDLCALC 55 03/21/2023   Lab Results  Component Value Date   TSH 6.200 (H) 11/17/2023   TSH 6.290 (H) 03/21/2023    Therapeutic Level Labs: No results found for: LITHIUM Lab Results  Component Value Date   VALPROATE 10 (L) 07/28/2019   No results found for: CBMZ  Current Medications: Current Outpatient Medications  Medication Sig Dispense Refill   alendronate (FOSAMAX) 70 MG tablet      Ascorbic Acid  (VITAMIN C) 100 MG tablet Take 100 mg by mouth daily.     busPIRone  (BUSPAR ) 10 MG tablet Take 2 tablets (20 mg total) by mouth 2 (two) times daily. 360 tablet 1   cevimeline (EVOXAC) 30 MG capsule Take 30 mg by mouth daily.     Cholecalciferol  (VITAMIN D -3) 1000 units CAPS Take 1,000 Units by mouth daily.     clonazePAM  (KLONOPIN ) 0.5 MG tablet Take 0.5-1 tablets (0.25-0.5 mg total) by mouth daily as needed for anxiety. AS NEEDED FOR SEVERE ANXIETY ATTACKS,21 pills must last 30 days 21 tablet 2   gabapentin  (NEURONTIN ) 400 MG capsule Take 4 capsules (1,600 mg total) by mouth as directed. Take 1 capsule daily AM and 1 capsule daily at noon and 2 capsules daily at bedtime 120 capsule 3   hydroxychloroquine  (PLAQUENIL ) 200 MG tablet Take 200 mg by mouth 2 (two) times daily.      Ibuprofen  200 MG/10ML SUSP Take 800 mg by mouth every 8 (eight) hours as needed. 600  mL 0   leflunomide (ARAVA) 10 MG tablet Take 1 tablet by mouth daily.     levothyroxine  (SYNTHROID ) 200 MCG tablet Take 200 mcg by mouth daily. Takes synthroid  M-Sat; half dose on Sundays     Multiple Vitamins-Minerals (PRESERVISION AREDS) CAPS Take 1 capsule by mouth daily.     omeprazole  (PRILOSEC) 40 MG capsule Take 1 capsule (40 mg total) by mouth daily. 90 capsule 3   pilocarpine (SALAGEN) 5 MG tablet Take 1 tablet by mouth 2 (two) times daily.     polyethylene glycol (MIRALAX  / GLYCOLAX ) 17 g packet Take 17 g by mouth daily. 14 each 0   rosuvastatin  (CRESTOR ) 10 MG tablet Take 1 tablet (10 mg total) by mouth daily. 90 tablet 3   SUMAtriptan  (IMITREX ) 100 MG tablet Take 100 mg by mouth every 2 (two) hours as needed for migraine.     tizanidine  (ZANAFLEX ) 2 MG capsule Take 1 capsule (2 mg total) by mouth at bedtime as needed for muscle spasms. 15 capsule 1   traMADol  (ULTRAM ) 50 MG tablet Take 1 tablet (50 mg total) by mouth daily as needed. 30 tablet 1   venlafaxine  XR (EFFEXOR  XR) 150 MG 24 hr capsule Take 1 capsule (150 mg total) by mouth daily with breakfast. 90 capsule 3   vitamin E 45 MG (100 UNITS) capsule Take 100 Units by mouth daily.     VRAYLAR  1.5 MG capsule TAKE 1 CAPSULE(1.5 MG) BY MOUTH DAILY 30 capsule 1   No current facility-administered medications for this visit.     Musculoskeletal: Strength & Muscle Tone: UTA Gait & Station: Seated Patient leans: N/A  Psychiatric Specialty Exam: Review of Systems  Psychiatric/Behavioral:  Positive for sleep disturbance. The patient is nervous/anxious.        Residual sadness, although improving    There were no vitals taken for this visit.There is no height or weight on file to calculate BMI.  General Appearance: Casual  Eye Contact:  Fair  Speech:  Clear and Coherent  Volume:  Normal  Mood:  Anxious, residual sadness although  improving  Affect:  Congruent  Thought Process:  Goal Directed and Descriptions of Associations: Intact  Orientation:  Full (Time, Place, and Person)  Thought Content: Logical   Suicidal Thoughts:  No  Homicidal Thoughts:  No  Memory:  Immediate;   Fair Recent;   Fair Remote;   Fair  Judgement:  Fair  Insight:  Fair  Psychomotor Activity:  Normal  Concentration:  Concentration: Fair and Attention Span: Fair  Recall:  [employer] of Knowledge: Fair  Language: Fair  Akathisia:  No  Handed:  Right  AIMS (if indicated): not done  Assets:  Communication Skills Desire for Improvement Housing Social Support  ADL's:  Intact  Cognition: WNL  Sleep:  improving   Screenings: Geneticist, Molecular Office Visit from 12/10/2023 in [facility] Office Visit from 10/15/2023 in [facility] Office Visit from 10/22/2021 in [facility] Office Visit from 07[facility]30/2021 in [facility] Office Visit from 06/15/2018 in [facility]  AIMS Total Score 0 0 0 0 5   GAD-7    Flowsheet Row Office Visit from 12/10/2023 in [facility] Office Visit from 10/15/2023 in [facility] Office Visit from 08/14/2023 in [facility] Office Visit from  07/15/2023 in [facility] Visit from 05/13/2023 in [facility]  Total GAD-7 Score 12 7 11 9 1    Mini-Mental    Flowsheet Row Office Visit from 01/01/2016 in [facility] Office Visit from 11/13/2015 in [facility] Office Visit from 10/26/2015 in [facility] Office Visit from 04/25/2015 in [facility] Office Visit from 10/25/2014 in [facility]  Total Score (max 30 points ) 25 26 21 23 29    PHQ2-9    Flowsheet Row Office Visit from 12/10/2023 in [facility] Office Visit from 11/17/2023 in [facility] Visit from 10/15/2023 in [facility] Office Visit from 08/14/2023 in [facility] Office Visit from 07/15/2023 in [facility] Total Score 5 0 2 3 0  PHQ-9 Total Score 19 -- 9 15 9    Flowsheet Row Video Visit from 12/26/2023 in [facility] Office Visit from 12/10/2023 in [facility] Office Visit from 10/15/2023 in [facility]  C-SSRS RISK CATEGORY No Risk No Risk No Risk     Assessment and Plan: Zoeie Ritter is a 63 year old Caucasian female with history of bipolar disorder, GAD, insomnia was evaluated by telemedicine today.  Discussed assessment and plan as noted below.  1. Bipolar 2 disorder, major depressive episode (HCC)-improving Currently with improvement in depression symptoms with the addition of higher dosage of BuSpar . Continue Gabapentin  400 mg in the morning, 400 mg at noon and 800 mg at bedtime. Continue Vraylar  1.5 mg daily Patient to continue to work with provider on abnormal TSH, currently on levothyroxine .  2. GAD (generalized anxiety disorder)-improving Currently reports anxiety as improving Continue BuSpar  20 mg twice daily Continue Venlafaxine  150 mg daily Continue Clonazepam  0.5 mg 3-4 times a week as needed Reviewed [place] PMP AWARxE Continue CBT with [facility]  3. Trauma and stressor-related disorder unspecified-rule out PTSD-improving Reports recent perceptual  disturbances unknown if true hallucination versus trauma related/anxiety related. Does not bother her much and she is able to cope by using relaxation techniques. Continue psychotherapy sessions  4. Primary insomnia-improving Much improvement in sleep since her last visit. Continue current medication regimen and sleep hygiene techniques  5. Mild cognitive disorder-chronic Will consider retrial of methylphenidate  in the future as needed.  Pending EKG, will needed prior to increasing the dosage of Vraylar  if needed in the future.  Patient to be reevaluated in 2 weeks from now.  Follow-up Follow-up in clinic in 2 weeks or sooner if needed.    Collaboration of Care: Collaboration of Care: Referral or follow-up with counselor/therapist AEB patient encouraged to continue psychotherapy sessions.  Patient/Guardian was advised Release of Information must be obtained prior to any record release in order to collaborate their care with an outside provider. Patient/Guardian was advised if they have not already done so to contact the registration department to sign all necessary forms in order for us  to release information regarding their care.   Consent: Patient/Guardian gives verbal consent for treatment and assignment of benefits for services provided during this visit. Patient/Guardian expressed understanding and agreed to proceed.   This note was generated in part or whole with voice recognition software. Voice recognition is usually quite accurate but there  are transcription errors that can and very often do occur. I apologize for any typographical errors that were not detected and corrected.    Caretha Rumbaugh, MD 12/26/2023, 9:25 AM

## 2024-01-12 ENCOUNTER — Ambulatory Visit

## 2024-01-14 ENCOUNTER — Telehealth (INDEPENDENT_AMBULATORY_CARE_PROVIDER_SITE_OTHER): Admitting: Psychiatry

## 2024-01-14 ENCOUNTER — Encounter: Payer: Self-pay | Admitting: Psychiatry

## 2024-01-14 DIAGNOSIS — F09 Unspecified mental disorder due to known physiological condition: Secondary | ICD-10-CM

## 2024-01-14 DIAGNOSIS — F439 Reaction to severe stress, unspecified: Secondary | ICD-10-CM | POA: Diagnosis not present

## 2024-01-14 DIAGNOSIS — F411 Generalized anxiety disorder: Secondary | ICD-10-CM | POA: Diagnosis not present

## 2024-01-14 DIAGNOSIS — F5101 Primary insomnia: Secondary | ICD-10-CM

## 2024-01-14 DIAGNOSIS — F3175 Bipolar disorder, in partial remission, most recent episode depressed: Secondary | ICD-10-CM | POA: Diagnosis not present

## 2024-01-14 MED ORDER — CARIPRAZINE HCL 1.5 MG PO CAPS: 1.5000 mg | ORAL_CAPSULE | Freq: Every day | ORAL | 3 refills | Status: AC

## 2024-01-14 MED ORDER — VENLAFAXINE HCL ER 150 MG PO CP24: 150.0000 mg | ORAL_CAPSULE | Freq: Every day | ORAL | 3 refills | Status: AC

## 2024-01-14 NOTE — Progress Notes (Signed)
 Virtual Visit via Video Note  I connected with Stacy [name] on 01/14/24 at  9:00 AM EST by a video enabled telemedicine application and verified that I am speaking with the correct person using two identifiers.  Location Provider Location : [facility] Patient Location : Car  Participants: Patient , Provider   I discussed the limitations of evaluation and management by telemedicine and the availability of in person appointments. The patient expressed understanding and agreed to proceed.   I discussed the assessment and treatment plan with the patient. The patient was provided an opportunity to ask questions and all were answered. The patient agreed with the plan and demonstrated an understanding of the instructions.   The patient was advised to call back or seek an in-person evaluation if the symptoms worsen or if the condition fails to improve as anticipated.  BH MD OP Progress Note  01/14/2024 9:12 AM Stacy [name]  MRN:  [id number]  Chief Complaint:  Chief Complaint  Patient presents with   Follow-up   Anxiety   Depression   Medication Refill   Discussed the use of AI scribe software for clinical note transcription with the patient, who gave verbal consent to proceed.  History of Present Illness Stacy [name] is a 63 year old Caucasian female, currently unemployed, lives in [place], has a history of bipolar disorder type II, GAD, mild cognitive disorder, primary insomnia, neuroleptic induced Parkinson's disease, Addison's disease, Sjogren syndrome, hypertension, hypothyroidism, arthritis, migraine headaches, seizure-like spells (rule out seizures ) was evaluated by telemedicine today.  Ongoing anxiety, particularly related to driving and being in crowded places such as grocery stores, continues to affect her. She describes feeling anxious about driving herself to a hair appointment and notes discomfort in crowds, often relying on her husband for support during these  activities. Coping strategies such as deep breathing and self-reassurance help her manage anxiety, and she attempts gradual exposure to anxiety-provoking situations.  Intermittent depressive symptoms occur, with some days marked by feelings of depression or anxiety. She uses relaxation techniques to cope with these symptoms.  Persistent sleep disturbances include difficulty falling asleep and experiencing visual disturbances, such as seeing shadows or movement, about 1 to 2 times per week. She talks herself through these experiences and returns to sleep.  Overall sleep has improved since last visit.  Her current medications include buspirone  20 mg twice daily, gabapentin  400 mg in the morning, 400 mg at noon, and 800 mg at bedtime, Vraylar  1.5 mg daily, venlafaxine  150 mg daily, and clonazepam  0.5 mg 3 to 4 times per week as needed. She has not made any changes to her medications since her last visit.  Her rheumatologist prescribed meloxicam for muscle pain and sleep, but she has not taken it and she keeps it on her bedside table.  She denies thoughts about hurting herself or others.  Her cardiologist recently performed an EKG, which showed normal results.  She agrees to sign an ROI to send it over to this provider for review.   Visit Diagnosis:    ICD-10-CM   1. Bipolar disorder, in partial remission, most recent episode depressed (HCC)  F31.75    type 2, moderate    2. GAD (generalized anxiety disorder)  F41.1 cariprazine  (VRAYLAR ) 1.5 MG capsule    venlafaxine  XR (EFFEXOR  XR) 150 MG 24 hr capsule    3. Trauma and stressor-related disorder  F43.9    Unspecified, rule out PTSD    4. Primary insomnia  F51.01     5. Mild cognitive  disorder  F09       Past Psychiatric History: I have reviewed past psychiatric history from progress note on 12/10/2017.  Past trials of Paxil , Wellbutrin , risk married on, Latuda ,, Zyprexa , Invega , Lamictal , Topamax, Lexapro , Belsomra , Depakote ,  Rexulti -unable to afford it.  Neuropsychological testing per Dr. Rodenbough-07/10/2021-not consistent with conditions like Alzheimer's, Lewy body dementia or other subcortical dementia.  Cognitive issues secondary to psychiatric condition.  Past Medical History:  Past Medical History:  Diagnosis Date   Aneurysm    Anxiety    Arthritis    Brain stem lesion    Chronic kidney disease    kideny stone   Depression    Diverticulitis    Family history of adverse reaction to anesthesia    mother has N/V   Fibromyalgia    GERD (gastroesophageal reflux disease)    Headache    Hypotension    Hypothyroid    Memory loss    Migraine    Raynaud disease    Renal stone    Sjogren's syndrome    Thyroid  disease     Past Surgical History:  Procedure Laterality Date   ABDOMINAL HYSTERECTOMY     2014 for noncancerous reasons d/t endometriosis; no cervix, ovaries.   BASAL CELL CARCINOMA EXCISION     BREAST BIOPSY Left    CESAREAN SECTION     1982, 1984   CYSTOSCOPY WITH STENT PLACEMENT Bilateral 08/01/2020   Procedure: CYSTOSCOPY WITH STENT PLACEMENT;  Surgeon: Penne Knee, MD;  Location: [facility];  Service: Urology;  Laterality: Bilateral;   CYSTOSCOPY/URETEROSCOPY/HOLMIUM LASER/STENT PLACEMENT Bilateral 08/14/2020   Procedure: CYSTOSCOPY/URETEROSCOPY/HOLMIUM LASER/STENT PLACEMENT;  Surgeon: Penne Knee, MD;  Location: [facility];  Service: Urology;  Laterality: Bilateral;   EYE SURGERY Right    cataract   KNEE SURGERY Left    torn meniscus   LITHOTRIPSY     MUSCLE BIOPSY     OOPHORECTOMY      Family Psychiatric History: I have reviewed family psychiatric history from progress note on 12/10/2017  Family History:  Family History  Problem Relation Age of Onset   Migraines Mother    Anxiety disorder Mother    Depression Mother    Cancer Father    Hypertension Father    Stroke Father    Heart attack Father    Alcohol abuse Father    Diabetes Brother    Alcohol abuse Sister     Drug abuse Sister    Anxiety disorder Sister    Depression Sister    Drug abuse Brother    Cancer Paternal Aunt    Lupus Paternal Aunt    Thyroid  disease Paternal Aunt    Diabetes Maternal Grandfather    Anxiety disorder Maternal Grandmother    Depression Maternal Grandmother     Social History: I have reviewed social history from progress note on 12/10/2017 Social History   Socioeconomic History   Marital status: Married    Spouse name: Debby   Number of children: 2   Years of education: 14   Highest education level: Associate degree: occupational, scientist, product/process development, or vocational program  Occupational History   Occupation: Disabled  Tobacco Use   Smoking status: Never   Smokeless tobacco: Never  Vaping Use   Vaping status: Never Used  Substance and Sexual Activity   Alcohol use: No    Alcohol/week: 0.0 standard drinks of alcohol   Drug use: Never   Sexual activity: Yes    Birth control/protection: Surgical  Other Topics Concern   Not  on file  Social History Narrative   Lives at home with husband. Stacy [name]).    Disabled since 2005      Education college.   Right handed.   2 cups caffeine/daily    2 children         She grew up  in [place]       Spent large part life in [place]       Moved her for husbands' work at [employer]      Enjoys reading historical fiction, going to the gym.    Social Drivers of Corporate Investment Banker Strain: Low Risk  (12/29/2023)   Received from [facility]   Overall Financial Resource Strain (CARDIA)    Difficulty of Paying Living Expenses: Not hard at all  Food Insecurity: No Food Insecurity (12/29/2023)   Received from [facility]   Hunger Vital Sign    Within the past 12 months, you worried that your food would run out before you got the money to buy more.: Never true    Within the past 12 months, the food you bought just didn't last and you didn't have money to get more.: Never true  Transportation  Needs: No Transportation Needs (12/29/2023)   Received from [facility]    In the past 12 months, has lack of transportation kept you from medical appointments or from getting medications?: No    Lack of Transportation (Non-Medical): No  Physical Activity: Sufficiently Active (07/14/2023)   Exercise Vital Sign    Days of Exercise per Week: 4 days    Minutes of Exercise per Session: 40 min  Stress: Stress Concern Present (07/14/2023)   [employer] of Occupational Health - Occupational Stress Questionnaire    Feeling of Stress : To some extent  Social Connections: Moderately Integrated (07/14/2023)   Social Connection and Isolation Panel    Frequency of Communication with Friends and Family: More than three times a week    Frequency of Social Gatherings with Friends and Family: Once a week    Attends Religious Services: More than 4 times per year    Active Member of [employer] or Organizations: No    Attends Engineer, Structural: Not on file    Marital Status: Married    Allergies:  Allergies  Allergen Reactions   Penicillins Hives   Amoxicillin Hives   Depakote  [Divalproex  Sodium]     vomiting   Lamictal  [Lamotrigine ]     Side effects of abdominal cramps, nausea , diarrhea ,nightmares    Metabolic Disorder Labs: Lab Results  Component Value Date   HGBA1C 5.0 11/13/2015   No results found for: PROLACTIN Lab Results  Component Value Date   CHOL 143 03/21/2023   TRIG 49 03/21/2023   HDL 77 03/21/2023   CHOLHDL 1.9 03/21/2023   LDLCALC 55 03/21/2023   Lab Results  Component Value Date   TSH 6.200 (H) 11/17/2023   TSH 6.290 (H) 03/21/2023    Therapeutic Level Labs: No results found for: LITHIUM Lab Results  Component Value Date   VALPROATE 10 (L) 07/28/2019   No results found for: CBMZ  Current Medications: Current Outpatient Medications  Medication Sig Dispense Refill   meloxicam (MOBIC) 15 MG tablet  Take 15 mg by mouth daily.     alendronate (FOSAMAX) 70 MG tablet      Ascorbic Acid  (VITAMIN C) 100 MG tablet Take 100 mg by mouth daily.  busPIRone  (BUSPAR ) 10 MG tablet Take 2 tablets (20 mg total) by mouth 2 (two) times daily. 360 tablet 1   cariprazine  (VRAYLAR ) 1.5 MG capsule Take 1 capsule (1.5 mg total) by mouth daily. 30 capsule 3   cevimeline (EVOXAC) 30 MG capsule Take 30 mg by mouth daily.     Cholecalciferol  (VITAMIN D -3) 1000 units CAPS Take 1,000 Units by mouth daily.     clonazePAM  (KLONOPIN ) 0.5 MG tablet Take 0.5-1 tablets (0.25-0.5 mg total) by mouth daily as needed for anxiety. AS NEEDED FOR SEVERE ANXIETY ATTACKS,21 pills must last 30 days 21 tablet 2   gabapentin  (NEURONTIN ) 400 MG capsule Take 4 capsules (1,600 mg total) by mouth as directed. Take 1 capsule daily AM and 1 capsule daily at noon and 2 capsules daily at bedtime 120 capsule 3   hydroxychloroquine  (PLAQUENIL ) 200 MG tablet Take 200 mg by mouth 2 (two) times daily.     Ibuprofen  200 MG/10ML SUSP Take 800 mg by mouth every 8 (eight) hours as needed. 600 mL 0   leflunomide (ARAVA) 10 MG tablet Take 1 tablet by mouth daily.     levothyroxine  (SYNTHROID ) 200 MCG tablet Take 200 mcg by mouth daily. Takes synthroid  M-Sat; half dose on Sundays     Multiple Vitamins-Minerals (PRESERVISION AREDS) CAPS Take 1 capsule by mouth daily.     omeprazole  (PRILOSEC) 40 MG capsule Take 1 capsule (40 mg total) by mouth daily. 90 capsule 3   pilocarpine (SALAGEN) 5 MG tablet Take 1 tablet by mouth 2 (two) times daily.     polyethylene glycol (MIRALAX  / GLYCOLAX ) 17 g packet Take 17 g by mouth daily. 14 each 0   rosuvastatin  (CRESTOR ) 10 MG tablet Take 1 tablet (10 mg total) by mouth daily. 90 tablet 3   SUMAtriptan  (IMITREX ) 100 MG tablet Take 100 mg by mouth every 2 (two) hours as needed for migraine.     tizanidine  (ZANAFLEX ) 2 MG capsule Take 1 capsule (2 mg total) by mouth at bedtime as needed for muscle spasms. 15  capsule 1   traMADol  (ULTRAM ) 50 MG tablet Take 1 tablet (50 mg total) by mouth daily as needed. 30 tablet 1   venlafaxine  XR (EFFEXOR  XR) 150 MG 24 hr capsule Take 1 capsule (150 mg total) by mouth daily with breakfast. 90 capsule 3   vitamin E 45 MG (100 UNITS) capsule Take 100 Units by mouth daily.     No current facility-administered medications for this visit.     Musculoskeletal: Strength & Muscle Tone: UTA Gait & Station: Seated Patient leans: N/A  Psychiatric Specialty Exam: Review of Systems  Psychiatric/Behavioral:  The patient is nervous/anxious.     There were no vitals taken for this visit.There is no height or weight on file to calculate BMI.  General Appearance: Fairly Groomed  Eye Contact:  Fair  Speech:  Clear and Coherent  Volume:  Normal  Mood:  Anxious  Affect:  Appropriate  Thought Process:  Goal Directed and Descriptions of Associations: Intact  Orientation:  Full (Time, Place, and Person)  Thought Content: Logical   Suicidal Thoughts:  No  Homicidal Thoughts:  No  Memory:  Immediate;   Fair Recent;   Fair Remote;   Fair  Judgement:  Fair  Insight:  Fair  Psychomotor Activity:  Normal  Concentration:  Concentration: Fair and Attention Span: Fair  Recall:  [employer] of Knowledge: Fair  Language: Fair  Akathisia:  No  Handed:  Right  AIMS (if indicated): not done  Assets:  Desire for Improvement Resilience Talents/Skills Transportation  ADL's:  Intact  Cognition: WNL  Sleep:  improving   Screenings: AIMS    Flowsheet Row Office Visit from 12/10/2023 in [facility] Office Visit from 10/15/2023 in [facility] Office Visit from 10/22/2021 in [facility] Office Visit from 07/30/2021 in [facility] Office Visit from 06/15/2018 in [facility]  AIMS  Total Score 0 0 0 0 5   GAD-7    Flowsheet Row Office Visit from 12/10/2023 in [facility] Office Visit from 10/15/2023 in [facility] Office Visit from 08/14/2023 in [facility] Office Visit from 07/15/2023 in [facility] Visit from 05/13/2023 in [facility]  Total GAD-7 Score 12 7 11 9 1    Mini-Mental    Flowsheet Row Office Visit from 01/01/2016 in [facility] Office Visit from 11/13/2015 in [facility] Office Visit from 10[facility]26/2015 in [facility] Office Visit from 04/25/2015 in [facility] Office Visit from 10/25/2014 in [facility]  Total Score (max 30 points ) 25 26 21 23 29    PHQ2-9    Flowsheet Row Office Visit from 12/10/2023 in [facility] Office Visit from 11/17/2023 in [facility] Visit from 10/15/2023 in [facility] Office Visit from 08/14/2023 in [facility] Office Visit from 07/15/2023 in [facility] Total Score 5 0 2 3 0  PHQ-9 Total Score 19 -- 9 15 9    Flowsheet Row Video Visit from 01/14/2024 in [facility] Video Visit from 12/26/2023 in [facility] Office Visit from 12/10/2023 in [facility]  C-SSRS RISK CATEGORY No Risk No Risk No Risk     Assessment and Plan: Cathey Fredenburg is a 63 year old Caucasian female with history of bipolar disorder, GAD, insomnia presented for a follow-up  appointment.  Discussed assessment and plan as noted below.  1. Bipolar disorder, in partial remission, most recent episode depressed (HCC) Currently reports improvement in her depression symptoms. Continue Gabapentin  400 mg in the morning, 400 mg at noon, 800 mg at bedtime. Continue Vraylar  1.5 mg daily  2. GAD (generalized anxiety disorder)-improving Currently reports anxiety is improving. Continue BuSpar  20 mg twice daily Continue Venlafaxine  150 mg daily Continue Clonazepam  0.5 mg 3-4 times a week as needed. Continue CBT with [facility]. Reviewed [place] PMP AWARxE   3. Trauma and stressor-related disorder-rule out PTSD-improving Currently reports perceptual disturbances unknown if true hallucination versus trauma related/anxiety related symptoms is improving. Continue psychotherapy sessions.  4. Primary insomnia-improving Reports sleep has improved. Continue sleep hygiene techniques.  5. Mild cognitive disorder-chronic Will monitor closely.  Pending EKG-patient agrees to sign an ROI to request from cardiology.  Follow-up Follow-up in clinic in 1 month or sooner if needed.    Collaboration of Care: Collaboration of Care: Referral or follow-up with counselor/therapist AEB encouraged to continue psychotherapy sessions  Patient/Guardian was advised Release of Information must be obtained prior to any record release in order to collaborate their care with  an outside provider. Patient/Guardian was advised if they have not already done so to contact the registration department to sign all necessary forms in order for us  to release information regarding their care.   Consent: Patient/Guardian gives verbal consent for treatment and assignment of benefits for services provided during this visit. Patient/Guardian expressed understanding and agreed to proceed.   This note was generated in part or whole with voice recognition software. Voice recognition is usually quite accurate but  there are transcription errors that can and very often do occur. I apologize for any typographical errors that were not detected and corrected.    Kynlie Jane, MD 01/16/2024, 7:55 AM

## 2024-02-18 ENCOUNTER — Telehealth: Admitting: Psychiatry

## 2024-02-18 ENCOUNTER — Encounter: Payer: Self-pay | Admitting: Psychiatry

## 2024-02-18 DIAGNOSIS — F09 Unspecified mental disorder due to known physiological condition: Secondary | ICD-10-CM | POA: Diagnosis not present

## 2024-02-18 DIAGNOSIS — F439 Reaction to severe stress, unspecified: Secondary | ICD-10-CM | POA: Diagnosis not present

## 2024-02-18 DIAGNOSIS — F411 Generalized anxiety disorder: Secondary | ICD-10-CM | POA: Diagnosis not present

## 2024-02-18 DIAGNOSIS — F3176 Bipolar disorder, in full remission, most recent episode depressed: Secondary | ICD-10-CM

## 2024-02-18 DIAGNOSIS — Z634 Disappearance and death of family member: Secondary | ICD-10-CM | POA: Diagnosis not present

## 2024-02-18 DIAGNOSIS — F5101 Primary insomnia: Secondary | ICD-10-CM

## 2024-02-18 NOTE — Progress Notes (Unsigned)
 Virtual Visit via Video Note  I connected with Stacy [name] on 02/18/2024 at 12:30 PM EST by a video enabled telemedicine application and verified that I am speaking with the correct person using two identifiers.  Location Provider Location : [facility] Patient Location : Home  Participants: Patient , Provider   I discussed the limitations of evaluation and management by telemedicine and the availability of in person appointments. The patient expressed understanding and agreed to proceed. I discussed the assessment and treatment plan with the patient. The patient was provided an opportunity to ask questions and all were answered. The patient agreed with the plan and demonstrated an understanding of the instructions.   The patient was advised to call back or seek an in-person evaluation if the symptoms worsen or if the condition fails to improve as anticipated.   BH MD OP Progress Note  02/18/2024 12:49 PM Stacy [name]  MRN:  [id number]  Chief Complaint:  Chief Complaint  Patient presents with   Medication Refill   Follow-up   Depression   Anxiety   Discussed the use of AI scribe software for clinical note transcription with the patient, who gave verbal consent to proceed.  History of Present Illness Stacy [name] is a 63 year old Caucasian female, currently unemployed, lives in [place], has a history of bipolar disorder type II, GAD, mild cognitive disorder, primary insomnia, neuroleptic induced Parkinson's disease, Addison's disease, Sjogren syndrome, hypertension, hypothyroidism, arthritis, migraine headaches, seizure-like spells (rule out seizures) was evaluated by telemedicine today.  She reports that significant grief and emotional distress have affected her since her father's death two weeks ago. She describes this loss as particularly difficult due to her close relationship and notes that the holidays, especially her first Christmas without him, have been especially  challenging. She reports significant grief and emotional distress since her father's death and is trying to cope by using her support system and relaxation techniques.  She experiences intermittent anxiety, which she manages with relaxation strategies. She denies any current thoughts of hurting herself or others. She also denies experiencing auditory hallucinations, paranoia, or visual hallucinations.  She reports that sleep difficulties have emerged since her father's passing. She states that she falls asleep around 8:30 PM but wakes up around 3:00 AM and has trouble returning to sleep, often lying awake for about an hour before getting another hour to an hour and a half of rest. She reports that these sleep disturbances relate to her grief.  She confirms that she is currently taking gabapentin , Vraylar , Buspar , venlafaxine , and clonazepam  as needed.  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   Visit Diagnosis:    ICD-10-CM   1. Bipolar disorder, in full remission, most recent episode depressed  F31.76    type 2, moderate    2. GAD (generalized anxiety disorder)  F41.1     3. Trauma and stressor-related disorder  F43.9    Unspecified, rule out PTSD    4. Primary insomnia  F51.01     5. Mild cognitive disorder  F09     6. Bereavement  Z63.4       Past Psychiatric History: I have reviewed past psychiatric history from progress note on 12/10/2017.  Past trials of Paxil , Wellbutrin , Latuda , Zyprexa , Invega , Lamictal , Topamax, Lexapro , Belsomra , Depakote , Rexulti -unable to afford.  Neuropsychological testing per Dr. Rodenbough-07/10/2021, not consistent with conditions like Alzheimer's, Lewy body dementia or other subcortical dementia.  Cognitive issues  secondary to psychiatric conditions.  Past Medical History:  Past Medical History:  Diagnosis Date   Aneurysm    Anxiety    Arthritis    Brain stem lesion     Chronic kidney disease    kideny stone   Depression    Diverticulitis    Family history of adverse reaction to anesthesia    mother has N/V   Fibromyalgia    GERD (gastroesophageal reflux disease)    Headache    Hypotension    Hypothyroid    Memory loss    Migraine    Raynaud disease    Renal stone    Sjogren's syndrome    Thyroid  disease     Past Surgical History:  Procedure Laterality Date   ABDOMINAL HYSTERECTOMY     2014 for noncancerous reasons d/t endometriosis; no cervix, ovaries.   BASAL CELL CARCINOMA EXCISION     BREAST BIOPSY Left    CESAREAN SECTION     1982, 1984   CYSTOSCOPY WITH STENT PLACEMENT Bilateral 08/01/2020   Procedure: CYSTOSCOPY WITH STENT PLACEMENT;  Surgeon: Penne Knee, MD;  Location: [facility];  Service: Urology;  Laterality: Bilateral;   CYSTOSCOPY/URETEROSCOPY/HOLMIUM LASER/STENT PLACEMENT Bilateral 08/14/2020   Procedure: CYSTOSCOPY/URETEROSCOPY/HOLMIUM LASER/STENT PLACEMENT;  Surgeon: Penne Knee, MD;  Location: [facility];  Service: Urology;  Laterality: Bilateral;   EYE SURGERY Right    cataract   KNEE SURGERY Left    torn meniscus   LITHOTRIPSY     MUSCLE BIOPSY     OOPHORECTOMY      Family Psychiatric History: I have reviewed family psychiatric history from progress note on 12/10/2017.  Family History:  Family History  Problem Relation Age of Onset   Migraines Mother    Anxiety disorder Mother    Depression Mother    Cancer Father    Hypertension Father    Stroke Father    Heart attack Father    Alcohol abuse Father    Diabetes Brother    Alcohol abuse Sister    Drug abuse Sister    Anxiety disorder Sister    Depression Sister    Drug abuse Brother    Cancer Paternal Aunt    Lupus Paternal Aunt    Thyroid  disease Paternal Aunt    Diabetes Maternal Grandfather    Anxiety disorder Maternal Grandmother    Depression Maternal Grandmother     Social History: I have reviewed social history from progress note on  12/10/2017. Social History   Socioeconomic History   Marital status: Married    Spouse name: Stacy [name]   Number of children: 2   Years of education: 14   Highest education level: Associate degree: occupational, scientist, product/process development, or vocational program  Occupational History   Occupation: Disabled  Tobacco Use   Smoking status: Never   Smokeless tobacco: Never  Vaping Use   Vaping status: Never Used  Substance and Sexual Activity   Alcohol use: No    Alcohol/week: 0.0 standard drinks of alcohol   Drug use: Never   Sexual activity: Yes    Birth control/protection: Surgical  Other Topics Concern   Not on file  Social History Narrative   Lives at home with husband. Stacy [name]).    Disabled since 2005      Education college.   Right handed.   2 cups caffeine/daily    2 children         She grew up  in [place]       Spent large part life  in [place]       Moved her for husbands' work at [employer]      Enjoys reading historical fiction, going to the gym.    Social Drivers of Health   Tobacco Use: Low Risk (02/18/2024)   Patient History    Smoking Tobacco Use: Never    Smokeless Tobacco Use: Never    Passive Exposure: Not on file  Financial Resource Strain: Low Risk  (12/29/2023)   Received from [facility]   Overall Financial Resource Strain (CARDIA)    Difficulty of Paying Living Expenses: Not hard at all  Food Insecurity: No Food Insecurity (12/29/2023)   [facility] from [facility]   Epic    Within the past 12 months, you worried that your food would run out before you got the money to buy more.: Never true    Within the past 12 months, the food you bought just didn't last and you didn't have money to get more.: Never true  Transportation Needs: No Transportation Needs (12/29/2023)   Received from [facility] - Transportation    In the past 12 months, has lack of transportation kept you from medical appointments or from getting  medications?: No    Lack of Transportation (Non-Medical): No  Physical Activity: Sufficiently Active (07/14/2023)   Exercise Vital Sign    Days of Exercise per Week: 4 days    Minutes of Exercise per Session: 40 min  Stress: Stress Concern Present (07/14/2023)   [employer] of Occupational Health - Occupational Stress Questionnaire    Feeling of Stress : To some extent  Social Connections: Moderately Integrated (07/14/2023)   Social Connection and Isolation Panel    Frequency of Communication with Friends and Family: More than three times a week    Frequency of Social Gatherings with Friends and Family: Once a week    Attends Religious Services: More than 4 times per year    Active Member of [employer] or Organizations: No    Attends Engineer, Structural: Not on file    Marital Status: Married  Depression (PHQ2-9): High Risk (12/10/2023)   Depression (PHQ2-9)    PHQ-2 Score: 19  Alcohol Screen: Not on file  Housing: Low Risk  (12/29/2023)   [facility]   Epic    In the last 12 months, was there a time when you were not able to pay the mortgage or rent on time?: No    In the past 12 months, how many times have you moved where you were living?: 0    At any time in the past 12 months, were you homeless or living in a shelter (including now)?: No  Utilities: Not At Risk (12/29/2023)   Received from [facility]   Epic    In the past 12 months has the electric, gas, oil, or water company threatened to shut off services in your home?: No  Health Literacy: Not on file    Allergies: Allergies[1]  Metabolic Disorder Labs: Lab Results  Component Value Date   HGBA1C 5.0 11/13/2015   No results found for: PROLACTIN Lab Results  Component Value Date   CHOL 143 03/21/2023   TRIG 49 03/21/2023   HDL 77 03/21/2023   CHOLHDL 1.9 03/21/2023   LDLCALC 55 03/21/2023   Lab Results  Component Value Date   TSH 6.200 (H) 11/17/2023    TSH 6.290 (H) 03/21/2023    Therapeutic Level  Labs: No results found for: LITHIUM Lab Results  Component Value Date   VALPROATE 10 (L) 07/28/2019   No results found for: CBMZ  Current Medications: Current Outpatient Medications  Medication Sig Dispense Refill   alendronate (FOSAMAX) 70 MG tablet      Ascorbic Acid  (VITAMIN C) 100 MG tablet Take 100 mg by mouth daily.     busPIRone  (BUSPAR ) 10 MG tablet Take 2 tablets (20 mg total) by mouth 2 (two) times daily. 360 tablet 1   cariprazine  (VRAYLAR ) 1.5 MG capsule Take 1 capsule (1.5 mg total) by mouth daily. 30 capsule 3   cevimeline (EVOXAC) 30 MG capsule Take 30 mg by mouth daily.     Cholecalciferol  (VITAMIN D -3) 1000 units CAPS Take 1,000 Units by mouth daily.     clonazePAM  (KLONOPIN ) 0.5 MG tablet Take 0.5-1 tablets (0.25-0.5 mg total) by mouth daily as needed for anxiety. AS NEEDED FOR SEVERE ANXIETY ATTACKS,21 pills must last 30 days 21 tablet 2   gabapentin  (NEURONTIN ) 400 MG capsule Take 4 capsules (1,600 mg total) by mouth as directed. Take 1 capsule daily AM and 1 capsule daily at noon and 2 capsules daily at bedtime 120 capsule 3   hydroxychloroquine  (PLAQUENIL ) 200 MG tablet Take 200 mg by mouth 2 (two) times daily.     Ibuprofen  200 MG/10ML SUSP Take 800 mg by mouth every 8 (eight) hours as needed. 600 mL 0   leflunomide (ARAVA) 10 MG tablet Take 1 tablet by mouth daily.     levothyroxine  (SYNTHROID ) 200 MCG tablet Take 200 mcg by mouth daily. Takes synthroid  M-Sat; half dose on Sundays     meloxicam (MOBIC) 15 MG tablet Take 15 mg by mouth daily.     Multiple Vitamins-Minerals (PRESERVISION AREDS) CAPS Take 1 capsule by mouth daily.     omeprazole  (PRILOSEC) 40 MG capsule Take 1 capsule (40 mg total) by mouth daily. 90 capsule 3   pilocarpine (SALAGEN) 5 MG tablet Take 1 tablet by mouth 2 (two) times daily.     polyethylene glycol (MIRALAX  / GLYCOLAX ) 17 g packet Take 17 g by mouth daily. 14 each 0    rosuvastatin  (CRESTOR ) 10 MG tablet Take 1 tablet (10 mg total) by mouth daily. 90 tablet 3   SUMAtriptan  (IMITREX ) 100 MG tablet Take 100 mg by mouth every 2 (two) hours as needed for migraine.     tizanidine  (ZANAFLEX ) 2 MG capsule Take 1 capsule (2 mg total) by mouth at bedtime as needed for muscle spasms. 15 capsule 1   traMADol  (ULTRAM ) 50 MG tablet Take 1 tablet (50 mg total) by mouth daily as needed. 30 tablet 1   venlafaxine  XR (EFFEXOR  XR) 150 MG 24 hr capsule Take 1 capsule (150 mg total) by mouth daily with breakfast. 90 capsule 3   vitamin E 45 MG (100 UNITS) capsule Take 100 Units by mouth daily.     No current facility-administered medications for this visit.     Musculoskeletal: Strength & Muscle Tone: UTA Gait & Station: Seated Patient leans: N/A  Psychiatric Specialty Exam: Review of Systems  Psychiatric/Behavioral:  Positive for sleep disturbance.        Grieving    There were no vitals taken for this visit.There is no height or weight on file to calculate BMI.  General Appearance: Casual  Eye Contact:  Fair  Speech:  Clear and Coherent  Volume:  Normal  Mood:  Grieving  Affect:  Appropriate  Thought Process:  Goal Directed  and Descriptions of Associations: Intact  Orientation:  Full (Time, Place, and Person)  Thought Content: Logical   Suicidal Thoughts:  No  Homicidal Thoughts:  No  Memory:  Immediate;   Fair Recent;   Fair Remote;   Fair  Judgement:  Fair  Insight:  Fair  Psychomotor Activity:  Normal  Concentration:  Concentration: Fair and Attention Span: Fair  Recall:  [employer] of Knowledge: Fair  Language: Fair  Akathisia:  No  Handed:  Right  AIMS (if indicated): not done  Assets:  Communication Skills Desire for Improvement Housing Social Support  ADL's:  Intact  Cognition: WNL  Sleep:  varies   Screenings: Geneticist, Molecular Office Visit from 12/10/2023 in [facility] Office Visit from  10/15/2023 in [facility] Office Visit from 10[facility]22/2021 in [facility] Office Visit from 07/30/2021 in [facility] Office Visit from 06/15/2018 in [facility]  AIMS Total Score 0 0 0 0 5   GAD-7    Flowsheet Row Office Visit from 12/10/2023 in [facility] Office Visit from 10/15/2023 in [facility] Office Visit from 08/14/2023 in [facility] Office Visit from 07/15/2023 in [facility] Visit from 05/13/2023 in [facility]  Total GAD-7 Score 12 7 11 9 1    Mini-Mental    Flowsheet Row Office Visit from 01/01/2016 in [facility] Visit from 11/13/2015 in [facility] Office Visit from 10/26/2015 in [facility] Office Visit from 04/25/2015 in [facility] Office Visit from 10/25/2014 in [facility]  Total Score (max 30 points ) 25 26 21 23 29    PHQ2-9    Flowsheet Row Office Visit from 12/10/2023 in [facility] Office Visit from 11/17/2023 in [facility] Visit from 10/15/2023 in [facility] Office Visit from 08/14/2023 in [facility] Office Visit from 07/15/2023 in [facility] Total Score 5 0 2 3 0  PHQ-9 Total Score 19 -- 9 15 9    Flowsheet Row Video Visit from 02/18/2024 in [facility] Video Visit from 01/14/2024 in [facility] Video Visit from 12/26/2023 in [facility]  C-SSRS RISK CATEGORY No Risk No Risk No Risk     Assessment and Plan: Stacy [name] is a 63 year old Caucasian female who presented for a follow-up appointment, discussed assessment and plan as noted below.  1. Bipolar disorder, in full remission, most recent episode depressed Currently reports overall doing well on the current medication regimen except for grief reaction to the loss of her father. Continue Gabapentin  400 mg daily in the morning, 400 mg at noon, 800 mg at bedtime. Continue Vraylar  1.5 mg daily  2. GAD (generalized anxiety disorder)-improving Currently reports overall improvement in anxiety symptoms Continue BuSpar  20 mg twice daily Continue Venlafaxine  150 mg daily Continue Clonazepam  0.5 mg 3-4 times a week as needed Continue CBT with [facility] Reviewed [place] PMP AWARxE   3. Trauma and stressor-related disorder unspecified-rule out PTSD-improving Currently denies any significant concerns Continue psychotherapy  sessions  4. Primary insomnia-unstable Recent sleep problems mostly related to grief reaction. Encouraged to work on sleep hygiene and to continue grief counseling. Declines addition of a sleep medication.  5. Mild cognitive disorder-chronic Will monitor closely.  6. Bereavement-unstable Lost her father 2 weeks ago, currently grieving. Encouraged to continue psychotherapy sessions with [facility]. Provided brief grief counseling.  Pending EKG-patient agrees to get this completed.  Follow-up Follow-up in clinic in 4 weeks or sooner if needed.    Collaboration of Care: Collaboration of Care: Referral or follow-up with counselor/therapist AEB encouraged to continue psychotherapy sessions/grief counseling.  Patient/Guardian was advised Release of Information must be obtained prior to any record release in order to collaborate their  care with an outside provider. Patient/Guardian was advised if they have not already done so to contact the registration department to sign all necessary forms in order for us  to release information regarding their care.   Consent: Patient/Guardian gives verbal consent for treatment and assignment of benefits for services provided during this visit. Patient/Guardian expressed understanding and agreed to proceed.   This note was generated in part or whole with voice recognition software. Voice recognition is usually quite accurate but there are transcription errors that can and very often do occur. I apologize for any typographical errors that were not detected and corrected.    Stacy Bakken, MD 02/20/2024, 8:59 AM     [1]  Allergies Allergen Reactions   Penicillins Hives   Amoxicillin Hives   Depakote  [Divalproex  Sodium]     vomiting   Lamictal  [Lamotrigine ]     Side effects of abdominal cramps, nausea , diarrhea ,nightmares

## 2024-03-10 ENCOUNTER — Other Ambulatory Visit: Payer: Self-pay | Admitting: Psychiatry

## 2024-03-10 DIAGNOSIS — F411 Generalized anxiety disorder: Secondary | ICD-10-CM

## 2024-03-17 ENCOUNTER — Telehealth: Admitting: Psychiatry

## 2024-03-23 ENCOUNTER — Telehealth: Payer: Self-pay

## 2024-03-23 DIAGNOSIS — Z9189 Other specified personal risk factors, not elsewhere classified: Secondary | ICD-10-CM

## 2024-03-23 NOTE — Addendum Note (Signed)
 Addended byBETHA COBY HEIGHT on: 03/23/2024 03:11 PM   Modules accepted: Orders

## 2024-03-23 NOTE — Telephone Encounter (Signed)
 Barbara from the Scheduling Department with [facility] called to report that she does not see an EKG order from Dr. Coby. The patient contacted scheduling to arrange the EKG. [facility] is requesting that the order be placed

## 2024-03-23 NOTE — Telephone Encounter (Signed)
Lvm order was placed.

## 2024-03-23 NOTE — Telephone Encounter (Signed)
 I have placed the order for EKG

## 2024-03-26 ENCOUNTER — Ambulatory Visit

## 2024-03-26 ENCOUNTER — Ambulatory Visit
Admission: RE | Admit: 2024-03-26 | Discharge: 2024-03-26 | Disposition: A | Source: Ambulatory Visit | Attending: Psychiatry

## 2024-03-26 ENCOUNTER — Ambulatory Visit: Payer: Self-pay | Admitting: Psychiatry

## 2024-03-26 DIAGNOSIS — Z9189 Other specified personal risk factors, not elsewhere classified: Secondary | ICD-10-CM | POA: Insufficient documentation

## 2024-03-26 NOTE — Telephone Encounter (Signed)
 EKG - NSR . Will discuss at upcoming visit.

## 2024-03-31 ENCOUNTER — Telehealth: Admitting: Psychiatry

## 2024-03-31 ENCOUNTER — Encounter: Payer: Self-pay | Admitting: Psychiatry

## 2024-03-31 DIAGNOSIS — Z634 Disappearance and death of family member: Secondary | ICD-10-CM

## 2024-03-31 DIAGNOSIS — F411 Generalized anxiety disorder: Secondary | ICD-10-CM | POA: Diagnosis not present

## 2024-03-31 DIAGNOSIS — F5101 Primary insomnia: Secondary | ICD-10-CM | POA: Diagnosis not present

## 2024-03-31 DIAGNOSIS — F09 Unspecified mental disorder due to known physiological condition: Secondary | ICD-10-CM

## 2024-03-31 DIAGNOSIS — F3176 Bipolar disorder, in full remission, most recent episode depressed: Secondary | ICD-10-CM

## 2024-03-31 DIAGNOSIS — F439 Reaction to severe stress, unspecified: Secondary | ICD-10-CM

## 2024-03-31 NOTE — Progress Notes (Signed)
 Virtual Visit via Video Note  I connected with Stacy [name] on 03/31/24 at  8:30 AM EST by a video enabled telemedicine application and verified that I am speaking with the correct person using two identifiers.  Location Provider Location : [facility] Patient Location : Home  Participants: Patient , Provider    I discussed the limitations of evaluation and management by telemedicine and the availability of in person appointments. The patient expressed understanding and agreed to proceed.   I discussed the assessment and treatment plan with the patient. The patient was provided an opportunity to ask questions and all were answered. The patient agreed with the plan and demonstrated an understanding of the instructions.   The patient was advised to call back or seek an in-person evaluation if the symptoms worsen or if the condition fails to improve as anticipated.                                                               BH MD OP Progress Note  03/31/2024 9:53 AM Stacy [name]  MRN:  [id number]  Chief Complaint:  Chief Complaint  Patient presents with   Medication Refill   Follow-up   Depression   Anxiety   Discussed the use of AI scribe software for clinical note transcription with the patient, who gave verbal consent to proceed.  History of Present Illness Stacy [name] is a 64 year old Caucasian female, currently unemployed, lives in [place], has a history of bipolar disorder type II, GAD, mild cognitive disorder, primary insomnia, neuroleptic induced Parkinson's disease, Addison's disease, Sjogren syndrome, hypertension, hypothyroidism, arthritis, migraine headaches, seizure-like spells (ruled out seizures) was evaluated by telemedicine today for a follow-up appointment.  She reports her mood symptoms are currently better on the current medication regimen. She denies any significant anxiety or depression symptoms. She describes sleeping well overall, noting occasional  awakenings during the night but the ability to return to sleep. She denies experiencing trauma-related symptoms, including intrusive memories, flashbacks, or nightmares in the past month.  She is able to drive short distances around her house although she avoids long distances.  She denies any significant anxiety driving anymore.  Ongoing grief related to the loss of her father continues, with good days and bad days, and she sometimes becomes tearful when thinking about him.  She continues to work with her therapist on the same.  She reports a longstanding fear of water, which has impacted her ability to participate in aquatic therapy for joint pain. She expresses discomfort with group aquatic activities and notes that anxiety serves as a barrier to engaging in these exercises.  She is agreeable to discussing this with her therapist to address safety and CBT.  She describes new onset of frequent teeth grinding throughout the day, which she has not experienced before.  Although patient did not express it as a concern, mild movements were observed in session today.  She does not believe it is distressing for her neither is she aware of it.  She believes it is likely due to her current cold symptoms.  She agrees to monitor this closely and let this provider know.  Will consider reducing or discontinuing medications like Vraylar  if this is likely secondary to these medication side effects.  She denies suicidal thoughts and thoughts of  harming others.  She denies any perceptual disturbances.    Visit Diagnosis:    ICD-10-CM   1. Bipolar disorder, in full remission, most recent episode depressed  F31.76    Type II, moderate    2. GAD (generalized anxiety disorder)  F41.1     3. Trauma and stressor-related disorder  F43.9    Unspecified    4. Primary insomnia  F51.01     5. Mild cognitive disorder  F09     6. Bereavement  Z63.4       Past Psychiatric History: I have reviewed past psychiatric  history from progress note on 12/10/2017.  Past trials of Paxil , Wellbutrin , Latuda , Zyprexa , Invega , Lamictal , Topamax, Lexapro , Belsomra , Depakote , Rexulti -unable to afford.  Neuropsychological testing per Dr. Rodenbough-07/10/2021, not consistent with conditions like Alzheimer's, Lewy body or other subcortical dementia.  Cognitive issues secondary to psychiatric conditions.  Past Medical History:  Past Medical History:  Diagnosis Date   Aneurysm    Anxiety    Arthritis    Brain stem lesion    Chronic kidney disease    kideny stone   Depression    Diverticulitis    Family history of adverse reaction to anesthesia    mother has N/V   Fibromyalgia    GERD (gastroesophageal reflux disease)    Headache    Hypotension    Hypothyroid    Memory loss    Migraine    Raynaud disease    Renal stone    Sjogren's syndrome    Thyroid  disease     Past Surgical History:  Procedure Laterality Date   ABDOMINAL HYSTERECTOMY     2014 for noncancerous reasons d/t endometriosis; no cervix, ovaries.   BASAL CELL CARCINOMA EXCISION     BREAST BIOPSY Left    CESAREAN SECTION     1982, 1984   CYSTOSCOPY WITH STENT PLACEMENT Bilateral 08/01/2020   Procedure: CYSTOSCOPY WITH STENT PLACEMENT;  Surgeon: Penne Knee, MD;  Location: [facility];  Service: Urology;  Laterality: Bilateral;   CYSTOSCOPY/URETEROSCOPY/HOLMIUM LASER/STENT PLACEMENT Bilateral 08/14/2020   Procedure: CYSTOSCOPY/URETEROSCOPY/HOLMIUM LASER/STENT PLACEMENT;  Surgeon: Penne Knee, MD;  Location: [facility];  Service: Urology;  Laterality: Bilateral;   EYE SURGERY Right    cataract   KNEE SURGERY Left    torn meniscus   LITHOTRIPSY     MUSCLE BIOPSY     OOPHORECTOMY      Family Psychiatric History: I have reviewed family psychiatric history from progress note on 12/10/2017.  Family History:  Family History  Problem Relation Age of Onset   Migraines Mother    Anxiety disorder Mother    Depression Mother    Cancer  Father    Hypertension Father    Stroke Father    Heart attack Father    Alcohol abuse Father    Diabetes Brother    Alcohol abuse Sister    Drug abuse Sister    Anxiety disorder Sister    Depression Sister    Drug abuse Brother    Cancer Paternal Aunt    Lupus Paternal Aunt    Thyroid  disease Paternal Aunt    Diabetes Maternal Grandfather    Anxiety disorder Maternal Grandmother    Depression Maternal Grandmother     Social History: I have reviewed social history from progress note on 12/10/2017. Social History   Socioeconomic History   Marital status: Married    Spouse name: Debby   Number of children: 2   Years of education: 29  Highest education level: Associate degree: occupational, scientist, product/process development, or vocational program  Occupational History   Occupation: Disabled  Tobacco Use   Smoking status: Never   Smokeless tobacco: Never  Vaping Use   Vaping status: Never Used  Substance and Sexual Activity   Alcohol use: No    Alcohol/week: 0.0 standard drinks of alcohol   Drug use: Never   Sexual activity: Yes    Birth control/protection: Surgical  Other Topics Concern   Not on file  Social History Narrative   Lives at home with husband. Cathy).    Disabled since 2005      Education college.   Right handed.   2 cups caffeine/daily    2 children         She grew up  in [place]       Spent large part life in [place]       Moved her for husbands' work at [employer]      Enjoys reading historical fiction, going to the gym.    Social Drivers of Health   Tobacco Use: Low Risk (03/31/2024)   Patient History    Smoking Tobacco Use: Never    Smokeless Tobacco Use: Never    Passive Exposure: Not on file  Financial Resource Strain: Low Risk  (12/29/2023)   Received from [facility]   Overall Financial Resource Strain (CARDIA)    Difficulty of Paying Living Expenses: Not hard at all  Food Insecurity: No Food Insecurity (12/29/2023)   Received from [facility]   Epic    Within the past 12 months, you worried that your food would run out before you got the money to buy more.: Never true    Within the past 12 months, the food you bought just didn't last and you didn't have money to get more.: Never true  Transportation Needs: No Transportation Needs (12/29/2023)   Received from [facility] - Transportation    In the past 12 months, has lack of transportation kept you from medical appointments or from getting medications?: No    Lack of Transportation (Non-Medical): No  Physical Activity: Sufficiently Active (07/14/2023)   Exercise Vital Sign    Days of Exercise per Week: 4 days    Minutes of Exercise per Session: 40 min  Stress: Stress Concern Present (07/14/2023)   [employer] of Occupational Health - Occupational Stress Questionnaire    Feeling of Stress : To some extent  Social Connections: Moderately Integrated (07/14/2023)   Social Connection and Isolation Panel    Frequency of Communication with Friends and Family: More than three times a week    Frequency of Social Gatherings with Friends and Family: Once a week    Attends Religious Services: More than 4 times per year    Active Member of [employer] or Organizations: No    Attends Engineer, Structural: Not on file    Marital Status: Married  Depression (PHQ2-9): High Risk (12/10/2023)   Depression (PHQ2-9)    PHQ-2 Score: 19  Alcohol Screen: Not on file  Housing: Low Risk  (12/29/2023)   Received from [facility]   Epic    In the last 12 months, was there a time when you were not able to pay the mortgage or rent on time?: No    In the past 12 months, how many times have you moved where you were living?: 0    At any time in the  past 12 months, were you homeless or living in a shelter (including now)?: No  Utilities: Not At Risk (12/29/2023)   Received from [facility]   Epic    In the past  12 months has the electric, gas, oil, or water company threatened to shut off services in your home?: No  Health Literacy: Not on file    Allergies: Allergies[1]  Metabolic Disorder Labs: Lab Results  Component Value Date   HGBA1C 5.0 11/13/2015   No results found for: PROLACTIN Lab Results  Component Value Date   CHOL 143 03/21/2023   TRIG 49 03/21/2023   HDL 77 03/21/2023   CHOLHDL 1.9 03/21/2023   LDLCALC 55 03/21/2023   Lab Results  Component Value Date   TSH 6.200 (H) 11/17/2023   TSH 6.290 (H) 03/21/2023    Therapeutic Level Labs: No results found for: LITHIUM Lab Results  Component Value Date   VALPROATE 10 (L) 07/28/2019   No results found for: CBMZ  Current Medications: Current Outpatient Medications  Medication Sig Dispense Refill   sulfaSALAzine (AZULFIDINE) 500 MG EC tablet TAKE 2 TABLETS(1000 MG) BY MOUTH TWICE DAILY WITH FOOD     alendronate (FOSAMAX) 70 MG tablet      Ascorbic Acid  (VITAMIN C) 100 MG tablet Take 100 mg by mouth daily.     busPIRone  (BUSPAR ) 10 MG tablet Take 2 tablets (20 mg total) by mouth 2 (two) times daily. 360 tablet 1   cariprazine  (VRAYLAR ) 1.5 MG capsule Take 1 capsule (1.5 mg total) by mouth daily. 30 capsule 3   cevimeline (EVOXAC) 30 MG capsule Take 30 mg by mouth daily.     Cholecalciferol  (VITAMIN D -3) 1000 units CAPS Take 1,000 Units by mouth daily.     clonazePAM  (KLONOPIN ) 0.5 MG tablet Take 0.5-1 tablets (0.25-0.5 mg total) by mouth daily as needed for anxiety. AS NEEDED FOR SEVERE ANXIETY ATTACKS,21 pills must last 30 days 21 tablet 2   gabapentin  (NEURONTIN ) 400 MG capsule TAKE ONE CAPSULE BY MOUTH EVERY MORNING, 1 AT NOON, AND 2 AT BEDTIME 120 capsule 3   hydroxychloroquine  (PLAQUENIL ) 200 MG tablet Take 200 mg by mouth 2 (two) times daily.     Ibuprofen  200 MG/10ML SUSP Take 800 mg by mouth every 8 (eight) hours as needed. 600 mL 0   leflunomide (ARAVA) 10 MG tablet Take 1 tablet by mouth daily.      levothyroxine  (SYNTHROID ) 200 MCG tablet Take 200 mcg by mouth daily. Takes synthroid  M-Sat; half dose on Sundays     meloxicam (MOBIC) 15 MG tablet Take 15 mg by mouth daily.     Multiple Vitamins-Minerals (PRESERVISION AREDS) CAPS Take 1 capsule by mouth daily.     omeprazole  (PRILOSEC) 40 MG capsule Take 1 capsule (40 mg total) by mouth daily. 90 capsule 3   Oyster Shell Calcium  500 MG TABS 1 tablet with meals Orally Twice a day; Duration: 30 day(s)     pilocarpine (SALAGEN) 5 MG tablet Take 1 tablet by mouth 2 (two) times daily.     polyethylene glycol (MIRALAX  / GLYCOLAX ) 17 g packet Take 17 g by mouth daily. 14 each 0   rosuvastatin  (CRESTOR ) 10 MG tablet Take 1 tablet (10 mg total) by mouth daily. 90 tablet 3   SUMAtriptan  (IMITREX ) 100 MG tablet Take 100 mg by mouth every 2 (two) hours as needed for migraine.     tizanidine  (ZANAFLEX ) 2 MG capsule Take 1 capsule (2 mg total) by mouth at  bedtime as needed for muscle spasms. 15 capsule 1   traMADol  (ULTRAM ) 50 MG tablet Take 1 tablet (50 mg total) by mouth daily as needed. 30 tablet 1   venlafaxine  XR (EFFEXOR  XR) 150 MG 24 hr capsule Take 1 capsule (150 mg total) by mouth daily with breakfast. 90 capsule 3   vitamin E 45 MG (100 UNITS) capsule Take 100 Units by mouth daily.     No current facility-administered medications for this visit.     Musculoskeletal: Strength & Muscle Tone: UTA Gait & Station: Seated Patient leans: N/A  Psychiatric Specialty Exam: Review of Systems  Psychiatric/Behavioral:         Grief    There were no vitals taken for this visit.There is no height or weight on file to calculate BMI.  General Appearance: Casual  Eye Contact:  Fair  Speech:  Clear and Coherent  Volume:  Normal  Mood:  grief - improving  Affect:  Appropriate  Thought Process:  Goal Directed and Descriptions of Associations: Intact  Orientation:  Full (Time, Place, and Person)  Thought Content: Logical   Suicidal Thoughts:   No  Homicidal Thoughts:  No  Memory:  Immediate;   Fair Recent;   Fair Remote;   Fair  Judgement:  Fair  Insight:  Fair  Psychomotor Activity:  Normal  Concentration:  Concentration: Fair and Attention Span: Fair  Recall:  [employer] of Knowledge: Fair  Language: Fair  Akathisia:  No  Handed:  Right  AIMS (if indicated): likely TD , reports mouth movements , but not aware , does not distress her  Assets:  Manufacturing Systems Engineer Desire for Improvement Housing Social Support  ADL's:  Intact  Cognition: WNL  Sleep:  improving   Screenings: AIMS    Flowsheet Row Video Visit from 03/31/2024 in [facility] Office Visit from 12/10/2023 in [facility] Visit from 10/15/2023 in [facility] Visit from 10/22/2021 in [facility] Office Visit from 07/30/2021 in [facility]  AIMS Total Score 1 0 0 0 0   GAD-7    Flowsheet Row Office Visit from 12/10/2023 in [facility] Office Visit from 10/15/2023 in [facility] Office Visit from 08/14/2023 in [facility] Office Visit from 07/15/2023 in [facility] Visit from 05/13/2023 in [facility]  Total GAD-7 Score 12 7 11 9 1    Mini-Mental    Flowsheet Row Office Visit from 01/01/2016 in [facility] Office Visit from 11/13/2015 in [facility] Office Visit from 10/26/2015 in [facility] Office Visit from 04/25/2015 in [facility] Office Visit from 10/25/2014 in [facility]  Total Score (max 30  points ) 25 26 21 23 29    PHQ2-9    Flowsheet Row Office Visit from 12/10/2023 in [facility] Office Visit from 11/17/2023 in [facility] Visit from 10/15/2023 in [facility] Office Visit from 08/14/2023 in [facility] Office Visit from 07/15/2023 in [facility] Total Score 5 0 2 3 0  PHQ-9 Total Score 19 -- 9 15 9  Flowsheet Row Video Visit from 03/31/2024 in [facility] Video Visit from 02/18/2024 in [facility] Video Visit from 01/14/2024 in [facility]  C-SSRS RISK CATEGORY No Risk No Risk No Risk     Assessment and Plan: Oluwatosin Bracy is a 64 year old Caucasian female who presented for a follow-up appointment, discussed assessment and plan as noted below.  1. Bipolar disorder, in full remission, most recent episode depressed Currently denies any significant depression or mood swings.  Patient with mouth movements and tooth grinding unknown if this is due to medications, patient to monitor this closely and let this provider know.  Also agrees to discuss with her dentist about teeth grinding and the need for a mouthguard. Continue Gabapentin  400 mg daily in the morning, 400 mg at noon, 800 mg at bedtime. Continue Vraylar  1.5 mg daily.  2. GAD (generalized anxiety disorder)-improving Currently reports overall improvement in anxiety symptoms although she continues to avoid aquatic therapy due to fear of being in the water as well as unable to drive long distances although she is able to drive short distances. Continue psychotherapy sessions with [facility] Continue BuSpar  20 mg twice daily Continue Venlafaxine  150 mg daily Continue Clonazepam  0.5 mg 3-4  times a week as needed Reviewed  PMP AWARxE   3. Trauma and stressor-related disorder unspecified-rule out PTSD-resolved Currently denies any significant trauma related symptoms.  4. Primary insomnia improving Currently reports sleep is improved.  Grief reaction led to sleep issues previously. Continue psychotherapy sessions and sleep hygiene techniques for management of grief.  5. Mild cognitive disorder-chronic Will monitor closely.  6. Bereavement-improving Continues to grieve the loss of her father who she lost in December. Encouraged to continue grief counseling with her therapist.  Follow-up Follow-up in clinic in 4 weeks or sooner in person.    Collaboration of Care: Collaboration of Care: Referral or follow-up with counselor/therapist AEB patient encouraged to continue psychotherapy sessions.  Patient/Guardian was advised Release of Information must be obtained prior to any record release in order to collaborate their care with an outside provider. Patient/Guardian was advised if they have not already done so to contact the registration department to sign all necessary forms in order for us  to release information regarding their care.   Consent: Patient/Guardian gives verbal consent for treatment and assignment of benefits for services provided during this visit. Patient/Guardian expressed understanding and agreed to proceed.  This note was generated in part or whole with voice recognition software. Voice recognition is usually quite accurate but there are transcription errors that can and very often do occur. I apologize for any typographical errors that were not detected and corrected.     Stacy Simmerman, MD 03/31/2024, 9:53 AM     [1]  Allergies Allergen Reactions   Penicillins Hives   Amoxicillin Hives   Depakote  [Divalproex  Sodium]     vomiting   Lamictal  [Lamotrigine ]     Side effects of abdominal cramps, nausea , diarrhea ,nightmares

## 2024-04-01 ENCOUNTER — Ambulatory Visit: Payer: Self-pay

## 2024-04-01 ENCOUNTER — Ambulatory Visit: Payer: Self-pay | Admitting: Family Medicine

## 2024-04-01 ENCOUNTER — Ambulatory Visit: Admitting: Family Medicine

## 2024-04-01 ENCOUNTER — Encounter: Payer: Self-pay | Admitting: Family Medicine

## 2024-04-01 ENCOUNTER — Ambulatory Visit
Admission: RE | Admit: 2024-04-01 | Discharge: 2024-04-01 | Disposition: A | Source: Ambulatory Visit | Attending: Family Medicine | Admitting: Family Medicine

## 2024-04-01 VITALS — BP 106/74 | HR 74 | Temp 98.2°F | Ht 66.0 in | Wt 169.6 lb

## 2024-04-01 DIAGNOSIS — G8929 Other chronic pain: Secondary | ICD-10-CM

## 2024-04-01 DIAGNOSIS — M545 Low back pain, unspecified: Secondary | ICD-10-CM | POA: Insufficient documentation

## 2024-04-01 MED ORDER — TIZANIDINE HCL 2 MG PO CAPS: 2.0000 mg | ORAL_CAPSULE | Freq: Every evening | ORAL | Status: AC | PRN

## 2024-04-01 MED ORDER — TIZANIDINE HCL 2 MG PO CAPS: 2.0000 mg | ORAL_CAPSULE | Freq: Every evening | ORAL | 0 refills | Status: DC | PRN

## 2024-04-01 MED ORDER — DICLOFENAC SODIUM 75 MG PO TBEC: 75.0000 mg | DELAYED_RELEASE_TABLET | Freq: Two times a day (BID) | ORAL | 0 refills | Status: AC

## 2024-04-01 NOTE — Assessment & Plan Note (Signed)
 Acute flare of chronic issue. No red flags. She does have some vertebral tenderness and history of osteopenia so we will evaluate with a plain film. No evidence of radiculopathy. Will treat with muscle relaxant at bedtime and have her start anti-inflammatory diclofenac  75 mg p.o. twice daily x 2 weeks. Can use heat on low back and start home physical therapy.  Patient to follow-up with PCP if symptoms not improving as expected for further evaluation.

## 2024-04-01 NOTE — Progress Notes (Signed)
 "   Patient ID: Stacy [name], female    DOB: 05-29-1960, 64 y.o.   MRN: [id number]  This visit was conducted in person.  BP 106/74 (BP Location: Left Arm, Patient Position: Sitting, Cuff Size: Normal)   Pulse 74   Temp 98.2 F (36.8 C) (Oral)   Ht 5' 6 (1.676 m)   Wt 169 lb 9.6 oz (76.9 kg)   SpO2 99%   BMI 27.37 kg/m    CC:  Chief Complaint  Patient presents with   Acute Visit    Severe lower back pain, pt was folding laundry when it first started, hurts worse when sitting, onset - yesterday  Pt has hx of chronic back pain, had back surgery 16 years ago in [place]    Tried tylenol  and gabapentin  with no relief     Subjective:   HPI: Stacy [name] is a 64 y.o. female presenting on 04/01/2024 for Acute Visit (Severe lower back pain, pt was folding laundry when it first started, hurts worse when sitting, onset - yesterday//Pt has hx of chronic back pain, had back surgery 16 years ago in [place]  //Tried tylenol  and gabapentin  with no relief )  PCP: Dineen  New onset severe ( 8/10 on pain scale) lower back pain.  Started after folding laundry yesterday. .. Started aching then.  Woke up this morning with severe back pain across the low both side.  No radiation of pain.  No new fall, no change in activity.  No new numbness or weakness in leg.  NO fever, new incontinence Pain worse with sitting .   She has no relief with Tylenol   ( 2 tabs) and gabapentin  Her typical dose of gabapentin  is 400 mg in the morning, noon and 800 mg at bedtime.   Patient has history of chronic back pain.  Back surgery 16 years ago in [place] . Patient also has dementia, rheumatoid arthritis and fibromyalgia listed past medical history.   Hx of osteopenia   SE to meloxicam in past  Relevant past medical, surgical, family and social history reviewed and updated as indicated. Interim medical history since our last visit reviewed. Allergies and medications reviewed and updated. Outpatient Medications  Prior to Visit  Medication Sig Dispense Refill   Ascorbic Acid  (VITAMIN C) 100 MG tablet Take 100 mg by mouth daily.     busPIRone  (BUSPAR ) 10 MG tablet Take 2 tablets (20 mg total) by mouth 2 (two) times daily. 360 tablet 1   cariprazine  (VRAYLAR ) 1.5 MG capsule Take 1 capsule (1.5 mg total) by mouth daily. 30 capsule 3   Cholecalciferol  (VITAMIN D -3) 1000 units CAPS Take 1,000 Units by mouth daily.     clonazePAM  (KLONOPIN ) 0.5 MG tablet Take 0.5-1 tablets (0.25-0.5 mg total) by mouth daily as needed for anxiety. AS NEEDED FOR SEVERE ANXIETY ATTACKS,21 pills must last 30 days 21 tablet 2   gabapentin  (NEURONTIN ) 400 MG capsule TAKE ONE CAPSULE BY MOUTH EVERY MORNING, 1 AT NOON, AND 2 AT BEDTIME 120 capsule 3   hydroxychloroquine  (PLAQUENIL ) 200 MG tablet Take 200 mg by mouth 2 (two) times daily.     leflunomide (ARAVA) 10 MG tablet Take 1 tablet by mouth daily.     levothyroxine  (SYNTHROID ) 200 MCG tablet Take 200 mcg by mouth daily. Takes synthroid  200mcg M-Sat; half dose on Sundays     Multiple Vitamins-Minerals (PRESERVISION AREDS) CAPS Take 1 capsule by mouth daily.     omeprazole  (PRILOSEC) 40 MG capsule Take 1 capsule (40 mg total) by mouth daily.  90 capsule 3   polyethylene glycol (MIRALAX  / GLYCOLAX ) 17 g packet Take 17 g by mouth daily. 14 each 0   sulfaSALAzine (AZULFIDINE) 500 MG EC tablet TAKE 2 TABLETS(1000 MG) BY MOUTH TWICE DAILY WITH FOOD     SUMAtriptan  (IMITREX ) 100 MG tablet Take 100 mg by mouth every 2 (two) hours as needed for migraine.     venlafaxine  XR (EFFEXOR  XR) 150 MG 24 hr capsule Take 1 capsule (150 mg total) by mouth daily with breakfast. 90 capsule 3   vitamin E 45 MG (100 UNITS) capsule Take 100 Units by mouth daily.     alendronate (FOSAMAX) 70 MG tablet  (Patient not taking: Reported on 04/01/2024)     cevimeline (EVOXAC) 30 MG capsule Take 30 mg by mouth daily. (Patient not taking: Reported on 04/01/2024)     Allie Gutta Calcium  500 MG TABS 1 tablet with meals  Orally Twice a day; Duration: 30 day(s) (Patient not taking: Reported on 04/01/2024)     pilocarpine (SALAGEN) 5 MG tablet Take 1 tablet by mouth 2 (two) times daily. (Patient not taking: Reported on 04/01/2024)     rosuvastatin  (CRESTOR ) 10 MG tablet Take 1 tablet (10 mg total) by mouth daily. (Patient not taking: Reported on 04/01/2024) 90 tablet 3   Ibuprofen  200 MG/10ML SUSP Take 800 mg by mouth every 8 (eight) hours as needed. (Patient not taking: Reported on 04/01/2024) 600 mL 0   meloxicam (MOBIC) 15 MG tablet Take 15 mg by mouth daily. (Patient not taking: Reported on 04/01/2024)     tizanidine  (ZANAFLEX ) 2 MG capsule Take 1 capsule (2 mg total) by mouth at bedtime as needed for muscle spasms. (Patient not taking: Reported on 04/01/2024) 15 capsule 1   traMADol  (ULTRAM ) 50 MG tablet Take 1 tablet (50 mg total) by mouth daily as needed. (Patient not taking: Reported on 04/01/2024) 30 tablet 1   No facility-administered medications prior to visit.     Per HPI unless specifically indicated in ROS section below Review of Systems  Constitutional:  Negative for fatigue and fever.  HENT:  Negative for congestion.   Eyes:  Negative for pain.  Respiratory:  Negative for cough and shortness of breath.   Cardiovascular:  Negative for chest pain, palpitations and leg swelling.  Gastrointestinal:  Negative for abdominal pain.  Genitourinary:  Negative for dysuria and vaginal bleeding.  Musculoskeletal:  Negative for back pain.  Neurological:  Negative for syncope, light-headedness and headaches.  Psychiatric/Behavioral:  Negative for dysphoric mood.    Objective:  BP 106/74 (BP Location: Left Arm, Patient Position: Sitting, Cuff Size: Normal)   Pulse 74   Temp 98.2 F (36.8 C) (Oral)   Ht 5' 6 (1.676 m)   Wt 169 lb 9.6 oz (76.9 kg)   SpO2 99%   BMI 27.37 kg/m   Wt Readings from Last 3 Encounters:  04/01/24 169 lb 9.6 oz (76.9 kg)  11/17/23 164 lb 12.8 oz (74.8 kg)  07/23/23 162 lb 3.2 oz (73.6 kg)       Physical Exam Constitutional:      General: She is not in acute distress.    Appearance: Normal appearance. She is well-developed. She is not ill-appearing or toxic-appearing.  HENT:     Head: Normocephalic.     Right Ear: Hearing, tympanic membrane, ear canal and external ear normal. Tympanic membrane is not erythematous, retracted or bulging.     Left Ear: Hearing, tympanic membrane, ear canal and external ear normal. Tympanic  membrane is not erythematous, retracted or bulging.     Nose: No mucosal edema or rhinorrhea.     Right Sinus: No maxillary sinus tenderness or frontal sinus tenderness.     Left Sinus: No maxillary sinus tenderness or frontal sinus tenderness.     Mouth/Throat:     Pharynx: Uvula midline.  Eyes:     General: Lids are normal. Lids are everted, no foreign bodies appreciated.     Conjunctiva/sclera: Conjunctivae normal.     Pupils: Pupils are equal, round, and reactive to light.  Neck:     Thyroid : No thyroid  mass or thyromegaly.     Vascular: No carotid bruit.     Trachea: Trachea normal.  Cardiovascular:     Rate and Rhythm: Normal rate and regular rhythm.     Pulses: Normal pulses.     Heart sounds: Normal heart sounds, S1 normal and S2 normal. No murmur heard.    No friction rub. No gallop.  Pulmonary:     Effort: Pulmonary effort is normal. No tachypnea or respiratory distress.     Breath sounds: Normal breath sounds. No decreased breath sounds, wheezing, rhonchi or rales.  Abdominal:     General: Bowel sounds are normal.     Palpations: Abdomen is soft.     Tenderness: There is no abdominal tenderness.  Musculoskeletal:     Cervical back: Normal, normal range of motion and neck supple.     Thoracic back: Normal.     Lumbar back: Spasms, tenderness and bony tenderness present. Normal range of motion. Negative right straight leg raise test and negative left straight leg raise test.  Skin:    General: Skin is warm and dry.     Findings: No  rash.  Neurological:     Mental Status: She is alert.  Psychiatric:        Mood and Affect: Mood is not anxious or depressed.        Speech: Speech normal.        Behavior: Behavior normal. Behavior is cooperative.        Thought Content: Thought content normal.        Judgment: Judgment normal.       Results for orders placed or performed in visit on 11/17/23  TSH   Collection Time: 11/17/23  8:36 AM  Result Value Ref Range   TSH 6.200 (H) 0.450 - 4.500 uIU/mL    Assessment and Plan  Acute midline low back pain without sciatica Assessment & Plan: Acute flare of chronic issue. No red flags. She does have some vertebral tenderness and history of osteopenia so we will evaluate with a plain film. No evidence of radiculopathy. Will treat with muscle relaxant at bedtime and have her start anti-inflammatory diclofenac  75 mg p.o. twice daily x 2 weeks. Can use heat on low back and start home physical therapy.  Patient to follow-up with PCP if symptoms not improving as expected for further evaluation.  Orders: -     DG Lumbar Spine Complete; Future -     tiZANidine  HCl; Take 1 capsule (2 mg total) by mouth at bedtime as needed for muscle spasms.  Chronic lumbosacral pain Assessment & Plan: Chronic, acute worsening Continue current dosing of gabapentin  400 mg/400 mg / 800 mg   Other orders -     Diclofenac  Sodium; Take 1 tablet (75 mg total) by mouth 2 (two) times daily.  Dispense: 30 tablet; Refill: 0    No follow-ups on file.  Greig Ring, MD  "

## 2024-04-01 NOTE — Assessment & Plan Note (Signed)
 Chronic, acute worsening Continue current dosing of gabapentin  400 mg/400 mg / 800 mg

## 2024-04-01 NOTE — Telephone Encounter (Signed)
 FYI Only or Action Required?: FYI only for provider: appointment scheduled on thismorning.  Patient was last seen in primary care on 11/17/2023 by Dineen Rollene MATSU, FNP.  Called Nurse Triage reporting Back Pain.  Symptoms began yesterday.  Interventions attempted: OTC medications: tylenol  and heating pad.  Symptoms are: unchanged.  Triage Disposition: See HCP Within 4 Hours (Or PCP Triage)  Patient/caregiver understands and will follow disposition?: Yes                            Reason for Triage: back pain since lastnight   Reason for Disposition  [1] SEVERE back pain (e.g., excruciating, unable to do any normal activities) AND [2] not improved 2 hours after pain medicine  Answer Assessment - Initial Assessment Questions 1. ONSET: When did the pain begin? (e.g., minutes, hours, days)     Yesterday - now much worse 8/10 2. LOCATION: Where does it hurt? (upper, mid or lower back)     Lower back 3. SEVERITY: How bad is the pain?  (e.g., Scale 1-10; mild, moderate, or severe)     8/10 4. PATTERN: Is the pain constant? (e.g., yes, no; constant, intermittent)      constant 5. RADIATION: Does the pain shoot into your legs or somewhere else?     no 6. CAUSE:  What do you think is causing the back pain?      unsure 7. BACK OVERUSE:  Any recent lifting of heavy objects, strenuous work or exercise?     no 8. MEDICINES: What have you taken so far for the pain? (e.g., nothing, acetaminophen , NSAIDS)     tylenol  9. NEUROLOGIC SYMPTOMS: Do you have any weakness, numbness, or problems with bowel/bladder control?     no 10. OTHER SYMPTOMS: Do you have any other symptoms? (e.g., fever, abdomen pain, burning with urination, blood in urine)       no  Protocols used: Back Pain-A-AH

## 2024-04-27 ENCOUNTER — Ambulatory Visit: Admitting: Psychiatry
